# Patient Record
Sex: Male | Born: 1935 | Race: White | Hispanic: No | Smoking: Former smoker
Health system: Southern US, Community
[De-identification: ages and names within clinical notes are randomized; demographics above are authoritative.]

## PROBLEM LIST (undated history)

## (undated) DIAGNOSIS — I255 Ischemic cardiomyopathy: Secondary | ICD-10-CM

## (undated) DIAGNOSIS — I82409 Acute embolism and thrombosis of unspecified deep veins of unspecified lower extremity: Secondary | ICD-10-CM

## (undated) DIAGNOSIS — K7460 Unspecified cirrhosis of liver: Secondary | ICD-10-CM

## (undated) DIAGNOSIS — I495 Sick sinus syndrome: Secondary | ICD-10-CM

## (undated) DIAGNOSIS — E119 Type 2 diabetes mellitus without complications: Secondary | ICD-10-CM

## (undated) DIAGNOSIS — K2270 Barrett's esophagus without dysplasia: Secondary | ICD-10-CM

## (undated) DIAGNOSIS — K219 Gastro-esophageal reflux disease without esophagitis: Secondary | ICD-10-CM

## (undated) DIAGNOSIS — E039 Hypothyroidism, unspecified: Secondary | ICD-10-CM

## (undated) DIAGNOSIS — I519 Heart disease, unspecified: Secondary | ICD-10-CM

## (undated) DIAGNOSIS — E785 Hyperlipidemia, unspecified: Secondary | ICD-10-CM

## (undated) DIAGNOSIS — I10 Essential (primary) hypertension: Secondary | ICD-10-CM

## (undated) DIAGNOSIS — K5730 Diverticulosis of large intestine without perforation or abscess without bleeding: Secondary | ICD-10-CM

## (undated) DIAGNOSIS — C3491 Malignant neoplasm of unspecified part of right bronchus or lung: Secondary | ICD-10-CM

## (undated) DIAGNOSIS — I480 Paroxysmal atrial fibrillation: Secondary | ICD-10-CM

## (undated) DIAGNOSIS — I350 Nonrheumatic aortic (valve) stenosis: Secondary | ICD-10-CM

## (undated) DIAGNOSIS — I00 Rheumatic fever without heart involvement: Secondary | ICD-10-CM

## (undated) DIAGNOSIS — K648 Other hemorrhoids: Secondary | ICD-10-CM

## (undated) HISTORY — DX: Paroxysmal atrial fibrillation: I48.0

## (undated) HISTORY — DX: Hypothyroidism, unspecified: E03.9

## (undated) HISTORY — DX: Essential (primary) hypertension: I10

## (undated) HISTORY — PX: FRACTURE SURGERY: SHX138

## (undated) HISTORY — PX: JOINT REPLACEMENT: SHX530

## (undated) HISTORY — PX: CARDIAC PACEMAKER PLACEMENT: SHX583

## (undated) HISTORY — PX: LUMBAR DISC SURGERY: SHX700

## (undated) HISTORY — PX: ANKLE FRACTURE SURGERY: SHX122

## (undated) HISTORY — DX: Gastro-esophageal reflux disease without esophagitis: K21.9

## (undated) HISTORY — DX: Barrett's esophagus without dysplasia: K22.70

## (undated) HISTORY — PX: APPENDECTOMY: SHX54

## (undated) HISTORY — DX: Other hemorrhoids: K64.8

## (undated) HISTORY — DX: Hyperlipidemia, unspecified: E78.5

## (undated) HISTORY — DX: Heart disease, unspecified: I51.9

## (undated) HISTORY — PX: CARDIAC CATHETERIZATION: SHX172

## (undated) HISTORY — DX: Nonrheumatic aortic (valve) stenosis: I35.0

## (undated) HISTORY — DX: Diverticulosis of large intestine without perforation or abscess without bleeding: K57.30

## (undated) HISTORY — DX: Unspecified cirrhosis of liver: K74.60

## (undated) HISTORY — PX: COLONOSCOPY: SHX174

## (undated) HISTORY — DX: Sick sinus syndrome: I49.5

## (undated) HISTORY — DX: Rheumatic fever without heart involvement: I00

## (undated) HISTORY — DX: Ischemic cardiomyopathy: I25.5

## (undated) HISTORY — PX: TOTAL HIP ARTHROPLASTY: SHX124

---

## 1996-12-01 HISTORY — PX: LUNG REMOVAL, PARTIAL: SHX233

## 1998-08-20 ENCOUNTER — Ambulatory Visit (HOSPITAL_COMMUNITY): Admission: RE | Admit: 1998-08-20 | Discharge: 1998-08-20 | Payer: Self-pay | Admitting: Hematology and Oncology

## 1998-08-20 ENCOUNTER — Encounter: Payer: Self-pay | Admitting: Hematology and Oncology

## 1999-01-16 ENCOUNTER — Ambulatory Visit (HOSPITAL_COMMUNITY): Admission: RE | Admit: 1999-01-16 | Discharge: 1999-01-16 | Payer: Self-pay | Admitting: Hematology and Oncology

## 1999-01-23 ENCOUNTER — Encounter: Payer: Self-pay | Admitting: Hematology and Oncology

## 1999-01-23 ENCOUNTER — Ambulatory Visit (HOSPITAL_COMMUNITY): Admission: RE | Admit: 1999-01-23 | Discharge: 1999-01-23 | Payer: Self-pay | Admitting: Hematology and Oncology

## 1999-05-24 ENCOUNTER — Encounter: Payer: Self-pay | Admitting: Neurosurgery

## 1999-05-27 ENCOUNTER — Ambulatory Visit (HOSPITAL_COMMUNITY): Admission: RE | Admit: 1999-05-27 | Discharge: 1999-05-27 | Payer: Self-pay | Admitting: Neurosurgery

## 1999-05-27 ENCOUNTER — Encounter: Payer: Self-pay | Admitting: Neurosurgery

## 1999-08-08 ENCOUNTER — Encounter: Payer: Self-pay | Admitting: Hematology and Oncology

## 1999-08-08 ENCOUNTER — Ambulatory Visit (HOSPITAL_COMMUNITY): Admission: RE | Admit: 1999-08-08 | Discharge: 1999-08-08 | Payer: Self-pay | Admitting: Hematology and Oncology

## 1999-10-23 ENCOUNTER — Encounter: Admission: RE | Admit: 1999-10-23 | Discharge: 1999-10-23 | Payer: Self-pay | Admitting: Thoracic Surgery

## 1999-10-23 ENCOUNTER — Encounter: Payer: Self-pay | Admitting: Thoracic Surgery

## 1999-11-06 ENCOUNTER — Encounter: Admission: RE | Admit: 1999-11-06 | Discharge: 1999-11-06 | Payer: Self-pay | Admitting: *Deleted

## 1999-11-06 ENCOUNTER — Encounter: Payer: Self-pay | Admitting: *Deleted

## 2000-01-28 ENCOUNTER — Encounter: Payer: Self-pay | Admitting: Hematology and Oncology

## 2000-01-28 ENCOUNTER — Ambulatory Visit (HOSPITAL_COMMUNITY): Admission: RE | Admit: 2000-01-28 | Discharge: 2000-01-28 | Payer: Self-pay | Admitting: Hematology and Oncology

## 2000-08-06 ENCOUNTER — Ambulatory Visit (HOSPITAL_COMMUNITY): Admission: RE | Admit: 2000-08-06 | Discharge: 2000-08-06 | Payer: Self-pay | Admitting: Hematology and Oncology

## 2000-08-06 ENCOUNTER — Encounter: Payer: Self-pay | Admitting: Hematology and Oncology

## 2001-02-23 ENCOUNTER — Ambulatory Visit (HOSPITAL_COMMUNITY): Admission: RE | Admit: 2001-02-23 | Discharge: 2001-02-23 | Payer: Self-pay | Admitting: Hematology and Oncology

## 2001-02-23 ENCOUNTER — Encounter: Payer: Self-pay | Admitting: Hematology and Oncology

## 2001-03-15 ENCOUNTER — Encounter: Payer: Self-pay | Admitting: Hematology and Oncology

## 2001-03-15 ENCOUNTER — Encounter: Admission: RE | Admit: 2001-03-15 | Discharge: 2001-03-15 | Payer: Self-pay | Admitting: Hematology and Oncology

## 2001-09-22 ENCOUNTER — Ambulatory Visit (HOSPITAL_COMMUNITY): Admission: RE | Admit: 2001-09-22 | Discharge: 2001-09-22 | Payer: Self-pay | Admitting: Hematology and Oncology

## 2001-09-22 ENCOUNTER — Encounter: Payer: Self-pay | Admitting: Hematology and Oncology

## 2002-04-11 ENCOUNTER — Ambulatory Visit (HOSPITAL_COMMUNITY): Admission: RE | Admit: 2002-04-11 | Discharge: 2002-04-11 | Payer: Self-pay | Admitting: Hematology and Oncology

## 2002-04-11 ENCOUNTER — Encounter: Payer: Self-pay | Admitting: Hematology and Oncology

## 2002-08-12 ENCOUNTER — Encounter (INDEPENDENT_AMBULATORY_CARE_PROVIDER_SITE_OTHER): Payer: Self-pay | Admitting: Specialist

## 2002-08-12 ENCOUNTER — Ambulatory Visit (HOSPITAL_COMMUNITY): Admission: RE | Admit: 2002-08-12 | Discharge: 2002-08-12 | Payer: Self-pay | Admitting: *Deleted

## 2003-10-02 ENCOUNTER — Ambulatory Visit (HOSPITAL_COMMUNITY): Admission: RE | Admit: 2003-10-02 | Discharge: 2003-10-02 | Payer: Self-pay | Admitting: Oncology

## 2004-03-01 ENCOUNTER — Encounter: Admission: RE | Admit: 2004-03-01 | Discharge: 2004-03-01 | Payer: Self-pay | Admitting: Internal Medicine

## 2004-06-19 ENCOUNTER — Emergency Department (HOSPITAL_COMMUNITY): Admission: EM | Admit: 2004-06-19 | Discharge: 2004-06-19 | Payer: Self-pay | Admitting: Emergency Medicine

## 2004-10-03 ENCOUNTER — Ambulatory Visit: Payer: Self-pay | Admitting: Oncology

## 2004-10-04 ENCOUNTER — Ambulatory Visit (HOSPITAL_COMMUNITY): Admission: RE | Admit: 2004-10-04 | Discharge: 2004-10-04 | Payer: Self-pay | Admitting: Oncology

## 2005-08-22 ENCOUNTER — Encounter: Admission: RE | Admit: 2005-08-22 | Discharge: 2005-08-22 | Payer: Self-pay | Admitting: Internal Medicine

## 2005-09-05 ENCOUNTER — Encounter: Admission: RE | Admit: 2005-09-05 | Discharge: 2005-09-05 | Payer: Self-pay | Admitting: Neurosurgery

## 2005-09-26 ENCOUNTER — Ambulatory Visit: Payer: Self-pay | Admitting: Oncology

## 2005-10-01 ENCOUNTER — Ambulatory Visit (HOSPITAL_COMMUNITY): Admission: RE | Admit: 2005-10-01 | Discharge: 2005-10-01 | Payer: Self-pay | Admitting: Oncology

## 2005-12-02 ENCOUNTER — Ambulatory Visit (HOSPITAL_COMMUNITY): Admission: RE | Admit: 2005-12-02 | Discharge: 2005-12-02 | Payer: Self-pay | Admitting: Neurosurgery

## 2006-01-06 ENCOUNTER — Encounter: Admission: RE | Admit: 2006-01-06 | Discharge: 2006-01-06 | Payer: Self-pay | Admitting: Neurosurgery

## 2006-02-01 ENCOUNTER — Encounter: Admission: RE | Admit: 2006-02-01 | Discharge: 2006-02-01 | Payer: Self-pay | Admitting: Neurosurgery

## 2006-03-24 ENCOUNTER — Inpatient Hospital Stay (HOSPITAL_COMMUNITY): Admission: RE | Admit: 2006-03-24 | Discharge: 2006-03-27 | Payer: Self-pay | Admitting: Orthopedic Surgery

## 2006-03-28 ENCOUNTER — Emergency Department (HOSPITAL_COMMUNITY): Admission: EM | Admit: 2006-03-28 | Discharge: 2006-03-28 | Payer: Self-pay | Admitting: Internal Medicine

## 2006-04-08 ENCOUNTER — Encounter: Payer: Self-pay | Admitting: Vascular Surgery

## 2006-04-08 ENCOUNTER — Encounter (HOSPITAL_BASED_OUTPATIENT_CLINIC_OR_DEPARTMENT_OTHER): Admission: RE | Admit: 2006-04-08 | Discharge: 2006-04-21 | Payer: Self-pay | Admitting: Surgery

## 2006-04-08 ENCOUNTER — Ambulatory Visit: Admission: RE | Admit: 2006-04-08 | Discharge: 2006-04-08 | Payer: Self-pay | Admitting: Orthopedic Surgery

## 2006-06-02 ENCOUNTER — Ambulatory Visit: Payer: Self-pay | Admitting: Family Medicine

## 2006-07-27 ENCOUNTER — Ambulatory Visit: Payer: Self-pay | Admitting: Family Medicine

## 2006-09-21 ENCOUNTER — Ambulatory Visit: Payer: Self-pay | Admitting: Family Medicine

## 2006-09-21 LAB — CONVERTED CEMR LAB
AST: 40 units/L — ABNORMAL HIGH (ref 0–37)
Chol/HDL Ratio, serum: 4.3
Cholesterol: 164 mg/dL (ref 0–200)
HDL: 38 mg/dL — ABNORMAL LOW (ref >39.0)
Triglyceride fasting, serum: 56 mg/dL (ref 0–149)

## 2006-10-27 ENCOUNTER — Ambulatory Visit: Payer: Self-pay | Admitting: Family Medicine

## 2006-10-27 LAB — CONVERTED CEMR LAB: ALT: 65 units/L — ABNORMAL HIGH (ref 0–40)

## 2006-11-10 ENCOUNTER — Ambulatory Visit: Payer: Self-pay | Admitting: Family Medicine

## 2006-11-16 ENCOUNTER — Ambulatory Visit: Payer: Self-pay | Admitting: Gastroenterology

## 2006-11-17 ENCOUNTER — Ambulatory Visit: Payer: Self-pay | Admitting: Family Medicine

## 2006-11-25 ENCOUNTER — Ambulatory Visit: Payer: Self-pay | Admitting: Family Medicine

## 2006-11-25 LAB — CONVERTED CEMR LAB
ALT: 71 units/L — ABNORMAL HIGH (ref 0–40)
AST: 55 units/L — ABNORMAL HIGH (ref 0–37)
BUN: 13 mg/dL (ref 6–23)
Calcium: 9.5 mg/dL (ref 8.4–10.5)
Chloride: 107 meq/L (ref 96–112)
Chol/HDL Ratio, serum: 3.1
Cholesterol: 129 mg/dL (ref 0–200)
GFR calc non Af Amer: 58 mL/min
Glomerular Filtration Rate, Af Am: 70 mL/min/{1.73_m2}
LDL Cholesterol: 78 mg/dL (ref 0–99)
VLDL: 10 mg/dL (ref 0–40)

## 2006-11-30 ENCOUNTER — Encounter: Admission: RE | Admit: 2006-11-30 | Discharge: 2006-11-30 | Payer: Self-pay | Admitting: Family Medicine

## 2006-12-02 ENCOUNTER — Ambulatory Visit: Payer: Self-pay | Admitting: *Deleted

## 2006-12-14 ENCOUNTER — Ambulatory Visit: Payer: Self-pay | Admitting: Gastroenterology

## 2006-12-14 LAB — CONVERTED CEMR LAB
AFP-Tumor Marker: 2.3 ng/mL (ref 0.0–8.0)
ALT: 54 units/L — ABNORMAL HIGH (ref 0–40)
Alkaline Phosphatase: 92 units/L (ref 39–117)
CO2: 32 meq/L (ref 19–32)
Calcium: 9.9 mg/dL (ref 8.4–10.5)
Ferritin: 76.1 ng/mL (ref 22.0–322.0)
Glucose, Bld: 101 mg/dL — ABNORMAL HIGH (ref 70–99)
HCV Ab: NEGATIVE
Hemoglobin: 16.5 g/dL (ref 13.0–17.0)
Hep B S Ab: NEGATIVE
Hepatitis B Surface Ag: NEGATIVE
INR: 1.2 (ref 0.9–2.0)
Iron: 59 ug/dL (ref 42–165)
Platelets: 187 10*3/uL (ref 150–400)
Prothrombin Time: 13.6 s (ref 10.0–14.0)
RBC: 5.89 M/uL — ABNORMAL HIGH (ref 4.22–5.81)
RDW: 13.6 % (ref 11.5–14.6)
Sodium: 143 meq/L (ref 135–145)
Total Protein: 7.2 g/dL (ref 6.0–8.3)

## 2006-12-22 DIAGNOSIS — K219 Gastro-esophageal reflux disease without esophagitis: Secondary | ICD-10-CM

## 2006-12-22 DIAGNOSIS — E039 Hypothyroidism, unspecified: Secondary | ICD-10-CM

## 2007-01-01 DIAGNOSIS — K2270 Barrett's esophagus without dysplasia: Secondary | ICD-10-CM

## 2007-01-06 ENCOUNTER — Ambulatory Visit: Payer: Self-pay | Admitting: Gastroenterology

## 2007-01-15 ENCOUNTER — Ambulatory Visit: Payer: Self-pay | Admitting: Family Medicine

## 2007-01-20 ENCOUNTER — Encounter (INDEPENDENT_AMBULATORY_CARE_PROVIDER_SITE_OTHER): Payer: Self-pay | Admitting: *Deleted

## 2007-01-20 ENCOUNTER — Ambulatory Visit: Payer: Self-pay | Admitting: Gastroenterology

## 2007-02-24 ENCOUNTER — Ambulatory Visit: Payer: Self-pay | Admitting: Family Medicine

## 2007-03-05 ENCOUNTER — Ambulatory Visit: Payer: Self-pay | Admitting: Gastroenterology

## 2007-04-16 ENCOUNTER — Ambulatory Visit: Payer: Self-pay | Admitting: Gastroenterology

## 2007-05-26 ENCOUNTER — Encounter (INDEPENDENT_AMBULATORY_CARE_PROVIDER_SITE_OTHER): Payer: Self-pay | Admitting: Family Medicine

## 2007-05-31 ENCOUNTER — Ambulatory Visit: Payer: Self-pay | Admitting: Family Medicine

## 2007-05-31 DIAGNOSIS — E785 Hyperlipidemia, unspecified: Secondary | ICD-10-CM | POA: Insufficient documentation

## 2007-05-31 LAB — CONVERTED CEMR LAB
CO2: 29 meq/L (ref 19–32)
Creatinine, Ser: 1.2 mg/dL (ref 0.4–1.5)
GFR calc Af Amer: 77 mL/min
Potassium: 4.8 meq/L (ref 3.5–5.1)
Sodium: 142 meq/L (ref 135–145)

## 2007-06-01 ENCOUNTER — Telehealth (INDEPENDENT_AMBULATORY_CARE_PROVIDER_SITE_OTHER): Payer: Self-pay | Admitting: *Deleted

## 2007-06-28 ENCOUNTER — Ambulatory Visit: Payer: Self-pay | Admitting: Gastroenterology

## 2007-07-09 ENCOUNTER — Ambulatory Visit: Payer: Self-pay | Admitting: Gastroenterology

## 2007-10-04 ENCOUNTER — Ambulatory Visit: Payer: Self-pay | Admitting: Family Medicine

## 2007-10-04 DIAGNOSIS — L219 Seborrheic dermatitis, unspecified: Secondary | ICD-10-CM | POA: Insufficient documentation

## 2007-10-06 ENCOUNTER — Telehealth (INDEPENDENT_AMBULATORY_CARE_PROVIDER_SITE_OTHER): Payer: Self-pay | Admitting: Family Medicine

## 2007-10-06 DIAGNOSIS — Z85118 Personal history of other malignant neoplasm of bronchus and lung: Secondary | ICD-10-CM | POA: Insufficient documentation

## 2007-10-08 ENCOUNTER — Ambulatory Visit: Payer: Self-pay | Admitting: Gastroenterology

## 2007-10-08 ENCOUNTER — Encounter (INDEPENDENT_AMBULATORY_CARE_PROVIDER_SITE_OTHER): Payer: Self-pay | Admitting: Family Medicine

## 2007-10-14 ENCOUNTER — Encounter: Admission: RE | Admit: 2007-10-14 | Discharge: 2007-10-14 | Payer: Self-pay | Admitting: Family Medicine

## 2007-10-19 ENCOUNTER — Telehealth (INDEPENDENT_AMBULATORY_CARE_PROVIDER_SITE_OTHER): Payer: Self-pay | Admitting: *Deleted

## 2007-10-22 ENCOUNTER — Encounter (INDEPENDENT_AMBULATORY_CARE_PROVIDER_SITE_OTHER): Payer: Self-pay | Admitting: Family Medicine

## 2007-11-08 ENCOUNTER — Ambulatory Visit: Payer: Self-pay | Admitting: Family Medicine

## 2007-11-16 DIAGNOSIS — K7460 Unspecified cirrhosis of liver: Secondary | ICD-10-CM

## 2007-12-03 ENCOUNTER — Ambulatory Visit: Payer: Self-pay | Admitting: Gastroenterology

## 2007-12-03 LAB — CONVERTED CEMR LAB
AFP-Tumor Marker: 2.9 ng/mL (ref 0.0–8.0)
ALT: 51 units/L (ref 0–53)
Basophils Relative: 0.7 % (ref 0.0–1.0)
Bilirubin, Direct: 0.2 mg/dL (ref 0.0–0.3)
CO2: 32 meq/L (ref 19–32)
Eosinophils Relative: 2.7 % (ref 0.0–5.0)
GFR calc Af Amer: 70 mL/min
Glucose, Bld: 105 mg/dL — ABNORMAL HIGH (ref 70–99)
Hemoglobin: 16.4 g/dL (ref 13.0–17.0)
Lymphocytes Relative: 27.7 % (ref 12.0–46.0)
MCV: 83.2 fL (ref 78.0–100.0)
Monocytes Absolute: 0.6 10*3/uL (ref 0.2–0.7)
Neutro Abs: 4.5 10*3/uL (ref 1.4–7.7)
Neutrophils Relative %: 60.5 % (ref 43.0–77.0)
Potassium: 5.2 meq/L — ABNORMAL HIGH (ref 3.5–5.1)
Prothrombin Time: 12.3 s (ref 10.9–13.3)
Sodium: 140 meq/L (ref 135–145)
Total Protein: 7.5 g/dL (ref 6.0–8.3)
WBC: 7.4 10*3/uL (ref 4.5–10.5)

## 2007-12-07 ENCOUNTER — Ambulatory Visit: Payer: Self-pay | Admitting: Cardiology

## 2007-12-20 ENCOUNTER — Encounter (INDEPENDENT_AMBULATORY_CARE_PROVIDER_SITE_OTHER): Payer: Self-pay | Admitting: Family Medicine

## 2007-12-27 ENCOUNTER — Ambulatory Visit: Payer: Self-pay | Admitting: Gastroenterology

## 2008-01-11 ENCOUNTER — Ambulatory Visit: Payer: Self-pay | Admitting: Gastroenterology

## 2008-01-11 ENCOUNTER — Encounter: Payer: Self-pay | Admitting: Gastroenterology

## 2008-01-11 ENCOUNTER — Encounter (INDEPENDENT_AMBULATORY_CARE_PROVIDER_SITE_OTHER): Payer: Self-pay | Admitting: Family Medicine

## 2008-01-11 DIAGNOSIS — K5730 Diverticulosis of large intestine without perforation or abscess without bleeding: Secondary | ICD-10-CM | POA: Insufficient documentation

## 2008-01-11 DIAGNOSIS — K648 Other hemorrhoids: Secondary | ICD-10-CM | POA: Insufficient documentation

## 2008-01-24 DIAGNOSIS — D126 Benign neoplasm of colon, unspecified: Secondary | ICD-10-CM

## 2008-02-07 ENCOUNTER — Ambulatory Visit: Payer: Self-pay | Admitting: Family Medicine

## 2008-02-09 ENCOUNTER — Ambulatory Visit: Payer: Self-pay | Admitting: Family Medicine

## 2008-02-10 ENCOUNTER — Encounter (INDEPENDENT_AMBULATORY_CARE_PROVIDER_SITE_OTHER): Payer: Self-pay | Admitting: *Deleted

## 2008-02-10 LAB — CONVERTED CEMR LAB
Cholesterol: 142 mg/dL (ref 0–200)
HDL: 37.6 mg/dL — ABNORMAL LOW (ref >39.0)
VLDL: 14 mg/dL (ref 0–40)

## 2008-03-01 LAB — HM COLONOSCOPY

## 2008-04-27 ENCOUNTER — Ambulatory Visit: Payer: Self-pay | Admitting: Family Medicine

## 2008-04-27 LAB — CONVERTED CEMR LAB

## 2008-05-05 ENCOUNTER — Telehealth (INDEPENDENT_AMBULATORY_CARE_PROVIDER_SITE_OTHER): Payer: Self-pay | Admitting: *Deleted

## 2008-05-29 ENCOUNTER — Ambulatory Visit: Payer: Self-pay | Admitting: Family Medicine

## 2008-05-29 LAB — CONVERTED CEMR LAB: Glucose, Bld: 166 mg/dL

## 2008-07-16 ENCOUNTER — Emergency Department (HOSPITAL_COMMUNITY): Admission: EM | Admit: 2008-07-16 | Discharge: 2008-07-16 | Payer: Self-pay | Admitting: *Deleted

## 2008-07-17 ENCOUNTER — Encounter: Payer: Self-pay | Admitting: Family Medicine

## 2008-07-20 DIAGNOSIS — N401 Enlarged prostate with lower urinary tract symptoms: Secondary | ICD-10-CM

## 2008-07-20 DIAGNOSIS — N138 Other obstructive and reflux uropathy: Secondary | ICD-10-CM | POA: Insufficient documentation

## 2008-07-24 ENCOUNTER — Encounter: Payer: Self-pay | Admitting: Family Medicine

## 2008-07-31 ENCOUNTER — Encounter: Admission: RE | Admit: 2008-07-31 | Discharge: 2008-07-31 | Payer: Self-pay | Admitting: Urology

## 2008-08-16 LAB — HM DIABETES FOOT EXAM

## 2008-08-29 ENCOUNTER — Ambulatory Visit: Payer: Self-pay | Admitting: Family Medicine

## 2008-08-30 LAB — CONVERTED CEMR LAB
ALT: 30 units/L (ref 0–53)
CO2: 22 meq/L (ref 19–32)
Calcium: 9.7 mg/dL (ref 8.4–10.5)
Chloride: 104 meq/L (ref 96–112)
Glucose, Bld: 103 mg/dL — ABNORMAL HIGH (ref 70–99)
Sodium: 141 meq/L (ref 135–145)
TSH: 1.862 microintl units/mL (ref 0.350–4.50)
Total Protein: 7.3 g/dL (ref 6.0–8.3)

## 2008-09-06 ENCOUNTER — Encounter: Payer: Self-pay | Admitting: Family Medicine

## 2008-09-30 ENCOUNTER — Encounter: Admission: RE | Admit: 2008-09-30 | Discharge: 2008-09-30 | Payer: Self-pay | Admitting: Neurosurgery

## 2008-11-28 ENCOUNTER — Ambulatory Visit: Payer: Self-pay | Admitting: Family Medicine

## 2008-11-28 LAB — CONVERTED CEMR LAB: Hgb A1c MFr Bld: 5.8 %

## 2008-12-04 ENCOUNTER — Telehealth: Payer: Self-pay | Admitting: Family Medicine

## 2008-12-05 ENCOUNTER — Encounter: Admission: RE | Admit: 2008-12-05 | Discharge: 2008-12-05 | Payer: Self-pay | Admitting: Family Medicine

## 2008-12-21 ENCOUNTER — Telehealth: Payer: Self-pay | Admitting: Family Medicine

## 2008-12-27 ENCOUNTER — Telehealth: Payer: Self-pay | Admitting: Family Medicine

## 2009-01-01 ENCOUNTER — Encounter: Payer: Self-pay | Admitting: Family Medicine

## 2009-01-02 ENCOUNTER — Encounter: Payer: Self-pay | Admitting: Family Medicine

## 2009-01-02 LAB — CONVERTED CEMR LAB
AST: 22 units/L (ref 0–37)
Alkaline Phosphatase: 81 units/L (ref 39–117)
Bilirubin, Direct: 0.1 mg/dL (ref 0.0–0.3)
HDL goal, serum: 40 mg/dL
Indirect Bilirubin: 0.4 mg/dL (ref 0.0–0.9)
LDL Cholesterol: 92 mg/dL (ref 0–99)
Total Bilirubin: 0.5 mg/dL (ref 0.3–1.2)

## 2009-01-11 ENCOUNTER — Telehealth (INDEPENDENT_AMBULATORY_CARE_PROVIDER_SITE_OTHER): Payer: Self-pay | Admitting: *Deleted

## 2009-01-15 ENCOUNTER — Encounter: Payer: Self-pay | Admitting: Family Medicine

## 2009-02-02 ENCOUNTER — Encounter: Payer: Self-pay | Admitting: Family Medicine

## 2009-02-05 ENCOUNTER — Ambulatory Visit: Payer: Self-pay | Admitting: Family Medicine

## 2009-02-05 LAB — CONVERTED CEMR LAB: TSH: 2.622 microintl units/mL (ref 0.350–4.500)

## 2009-03-07 ENCOUNTER — Encounter: Payer: Self-pay | Admitting: Family Medicine

## 2009-03-14 ENCOUNTER — Ambulatory Visit: Payer: Self-pay | Admitting: Family Medicine

## 2009-03-26 ENCOUNTER — Ambulatory Visit: Payer: Self-pay | Admitting: Family Medicine

## 2009-03-26 LAB — CONVERTED CEMR LAB
Blood Glucose, Fasting: 122 mg/dL
Hgb A1c MFr Bld: 6 %

## 2009-06-14 ENCOUNTER — Telehealth: Payer: Self-pay | Admitting: Family Medicine

## 2009-08-08 ENCOUNTER — Encounter: Payer: Self-pay | Admitting: Family Medicine

## 2009-08-15 ENCOUNTER — Telehealth: Payer: Self-pay | Admitting: Family Medicine

## 2009-08-16 ENCOUNTER — Ambulatory Visit: Payer: Self-pay | Admitting: Family Medicine

## 2009-08-16 DIAGNOSIS — E8881 Metabolic syndrome: Secondary | ICD-10-CM | POA: Insufficient documentation

## 2009-08-17 LAB — CONVERTED CEMR LAB: TSH: 1.569 microintl units/mL (ref 0.350–4.500)

## 2009-09-25 ENCOUNTER — Ambulatory Visit: Payer: Self-pay | Admitting: Family Medicine

## 2009-09-25 LAB — CONVERTED CEMR LAB: Hgb A1c MFr Bld: 5.8 %

## 2009-09-26 LAB — CONVERTED CEMR LAB
ALT: 31 units/L (ref 0–53)
BUN: 19 mg/dL (ref 6–23)
CO2: 24 meq/L (ref 19–32)
Calcium: 9.8 mg/dL (ref 8.4–10.5)
Chloride: 104 meq/L (ref 96–112)
Cholesterol: 166 mg/dL (ref 0–200)
Creatinine, Ser: 1.19 mg/dL (ref 0.40–1.50)
Glucose, Bld: 114 mg/dL — ABNORMAL HIGH (ref 70–99)
Total CHOL/HDL Ratio: 3.6
Triglycerides: 76 mg/dL (ref <150)

## 2009-10-19 ENCOUNTER — Telehealth: Payer: Self-pay | Admitting: Family Medicine

## 2009-11-01 ENCOUNTER — Encounter: Admission: RE | Admit: 2009-11-01 | Discharge: 2009-11-01 | Payer: Self-pay | Admitting: Family Medicine

## 2009-12-03 ENCOUNTER — Emergency Department (HOSPITAL_COMMUNITY)
Admission: EM | Admit: 2009-12-03 | Discharge: 2009-12-03 | Payer: Self-pay | Source: Home / Self Care | Admitting: Emergency Medicine

## 2009-12-06 ENCOUNTER — Ambulatory Visit: Payer: Self-pay | Admitting: Family Medicine

## 2010-01-28 ENCOUNTER — Ambulatory Visit: Payer: Self-pay | Admitting: Family Medicine

## 2010-01-28 LAB — CONVERTED CEMR LAB
ALT: 21 units/L (ref 0–53)
CO2: 22 meq/L (ref 19–32)
Calcium: 9.7 mg/dL (ref 8.4–10.5)
Chloride: 103 meq/L (ref 96–112)
Cholesterol: 126 mg/dL (ref 0–200)
Creatinine, Ser: 1.2 mg/dL (ref 0.40–1.50)
Glucose, Bld: 97 mg/dL (ref 70–99)
Hgb A1c MFr Bld: 5.8 %
Total Protein: 7.3 g/dL (ref 6.0–8.3)

## 2010-05-25 ENCOUNTER — Encounter: Payer: Self-pay | Admitting: Family Medicine

## 2010-06-12 ENCOUNTER — Ambulatory Visit: Payer: Self-pay | Admitting: Family Medicine

## 2010-07-01 HISTORY — PX: CATARACT EXTRACTION, BILATERAL: SHX1313

## 2010-07-15 ENCOUNTER — Ambulatory Visit: Payer: Self-pay | Admitting: Family Medicine

## 2010-07-15 LAB — CONVERTED CEMR LAB
CO2: 24 meq/L (ref 19–32)
Chloride: 104 meq/L (ref 96–112)
Hgb A1c MFr Bld: 5.9 %
Sodium: 139 meq/L (ref 135–145)

## 2010-11-19 ENCOUNTER — Ambulatory Visit: Payer: Self-pay | Admitting: Family Medicine

## 2010-11-19 ENCOUNTER — Encounter
Admission: RE | Admit: 2010-11-19 | Discharge: 2010-11-19 | Payer: Self-pay | Source: Home / Self Care | Attending: Family Medicine | Admitting: Family Medicine

## 2010-11-19 LAB — CONVERTED CEMR LAB
Creatinine,U: 100 mg/dL
Microalbumin U total vol: 10 mg/L

## 2010-11-20 ENCOUNTER — Encounter: Payer: Self-pay | Admitting: Family Medicine

## 2010-11-20 LAB — CONVERTED CEMR LAB
AST: 36 units/L (ref 0–37)
Albumin: 4.1 g/dL (ref 3.5–5.2)
Alkaline Phosphatase: 90 units/L (ref 39–117)
HDL: 49 mg/dL (ref >39)
LDL Cholesterol: 87 mg/dL (ref 0–99)
Potassium: 5.3 meq/L (ref 3.5–5.3)
Sodium: 140 meq/L (ref 135–145)
TSH: 2.308 microintl units/mL (ref 0.350–4.500)
Total Protein: 7 g/dL (ref 6.0–8.3)

## 2010-12-21 ENCOUNTER — Encounter: Payer: Self-pay | Admitting: Oncology

## 2010-12-21 ENCOUNTER — Encounter: Payer: Self-pay | Admitting: *Deleted

## 2010-12-23 ENCOUNTER — Encounter: Payer: Self-pay | Admitting: Urology

## 2010-12-31 NOTE — Assessment & Plan Note (Signed)
Summary: 4 MO FU DM, Chol   Vital Signs:  Patient profile:   76 year old male Height:      69 inches Weight:      229 pounds Pulse rate:   74 / minute BP sitting:   134 / 77  (left arm) Cuff size:   large  Vitals Entered By: Kathlene November (January 28, 2010 8:18 AM)  CC: checkup BP and BS   Primary Care Provider:  Linford Arnold, C  CC:  checkup BP and BS.  History of Present Illness: Doing well overal. Has been working out and exercising at [employer]. Brought in log os sugars and blood pressures.   Diabetes Management History:      The patient is a 76 years old male who comes in for evaluation of Type 2 Diabetes Mellitus.  He states understanding of dietary principles and is following his diet appropriately.  No sensory loss is reported.  He is checking home blood sugars.  He says that he is exercising.  Type of exercise includes: treadmill, bike.  He is doing this 7 times per week.        Hypoglycemic symptoms are not occurring.  No hyperglycemic symptoms are reported.  Other comments include: Has lost 10 lbs since last OV> Going to [facility] and quit eating  before bedtime.  .        There are no symptoms to suggest diabetic complications.  Since his last visit, no infections have occurred.        His home blood sugars include fasting blood sugars: highest: 145, lowest: 97.    Current Medications (verified): 1)  Albertsons Buffered Aspirin 325 Mg  Tabs (Aspirin Buffered) 2)  Levothyroxine Sodium 112 Mcg  Tabs (Levothyroxine Sodium) .... Take One Tablet By Mouth Once A Day 3)  Nexium 40 Mg  Cpdr (Esomeprazole Magnesium) .... Take One Tablert By Mouth Once A Day 4)  Terbinafine Hcl 250 Mg Tabs (Terbinafine Hcl) .... Take 1 Tab By Mouth Once Daily 5)  Pravastatin Sodium 20 Mg Tabs (Pravastatin Sodium) .... Take 1 Tablet By Mouth Once A Day At Bedtime  Allergies (verified): 1)  ! Lipitor  Comments:  Nurse/Medical Assistant: The patient's medications and allergies were reviewed with the  patient and were updated in the Medication and Allergy Lists. Kathlene November (January 28, 2010 8:19 AM)  Physical Exam  General:  Well-developed,well-nourished,in no acute distress; alert,appropriate and cooperative throughout examination Head:  Normocephalic and atraumatic without obvious abnormalities. No apparent alopecia or balding. Eyes:  No corneal or conjunctival inflammation noted. EOMI. Perrla.  Lungs:  Normal respiratory effort, chest expands symmetrically. Lungs are clear to auscultation, no crackles or wheezes. Heart:  Normal rate and regular rhythm. S1 and S2 normal without gallop, click, rub or other extra sounds. 1/6 SEM best heard aat the right sternal border.  Skin:  no rashes.   Cervical Nodes:  No lymphadenopathy noted Psych:  Cognition and judgment appear intact. Alert and cooperative with normal attention span and concentration. No apparent delusions, illusions, hallucinations   Impression & Recommendations:  Problem # 1:  DIABETES MELLITUS, CONTROLLED (ICD-250.00) Has lost 10 pounds and donig very well. Congratualted him.  Keep up the good work. CBGs look great. Not on any medications. F/U in 4 months.  Due for labs today His updated medication list for this problem includes:    Albertsons Buffered Aspirin 325 Mg Tabs (Aspirin buffered)  Orders: Fingerstick (36416) Hgb A1C ([id number]) T-Comprehensive Metabolic Panel ([id number])  Labs  Reviewed: Creat: 1.19 (09/25/2009)   Microalbumin: 10 (09/25/2009)  Last Eye Exam: normal (05/01/2009) Reviewed HgBA1c results: 5.8 (09/25/2009)  6.0 (03/26/2009)  Problem # 2:  HYPERLIPIDEMIA (ICD-272.4) Due to recheck level since we changed his statin. He has been tolerating this well.  His updated medication list for this problem includes:    Pravastatin Sodium 20 Mg Tabs (Pravastatin sodium) .Marland Kitchen... Take 1 tablet by mouth once a day at bedtime  Orders: T-Lipid Profile ([id number])  Problem # 3:  HYPOTHYROIDISM  (ICD-244.9) Due to recheck.  His updated medication list for this problem includes:    Levothyroxine Sodium 112 Mcg Tabs (Levothyroxine sodium) .Marland Kitchen... Take one tablet by mouth once a day  Orders: T-TSH ([id number])  Complete Medication List: 1)  Albertsons Buffered Aspirin 325 Mg Tabs (Aspirin buffered) 2)  Levothyroxine Sodium 112 Mcg Tabs (Levothyroxine sodium) .... Take one tablet by mouth once a day 3)  Nexium 40 Mg Cpdr (Esomeprazole magnesium) .... Take one tablert by mouth once a day 4)  Terbinafine Hcl 250 Mg Tabs (Terbinafine hcl) .... Take 1 tab by mouth once daily 5)  Pravastatin Sodium 20 Mg Tabs (Pravastatin sodium) .... Take 1 tablet by mouth once a day at bedtime  Diabetes Management Assessment/Plan:      The following lipid goals have been established for the patient: Total cholesterol goal of 200; LDL cholesterol goal of 100; HDL cholesterol goal of 40; Triglyceride goal of 150.  His blood pressure goal is < 130/80.    Patient Instructions: 1)  Please schedule a follow-up appointment in 4 months .   Laboratory Results   Blood Tests   Date/Time Received: 01/28/2010 Date/Time Reported: 01/28/2010  HGBA1C: 5.8%   (Normal Range: Non-Diabetic - 3-6%   Control Diabetic - 6-8%)

## 2010-12-31 NOTE — Assessment & Plan Note (Signed)
Summary: F/U DM, HTN, thyroid   Vital Signs:  Patient profile:   76 year old male Height:      69 inches Weight:      223 pounds BMI:     33.05 Pulse rate:   73 / minute BP sitting:   137 / 82  (left arm) Cuff size:   regular  Vitals Entered By: Avon Gully CMA, Duncan Dull) (July 15, 2010 8:12 AM) CC: f/u Diabetes   Primary Care Provider:  Linford Arnold, C  CC:  f/u Diabetes.  History of Present Illness: Recently had cataract surgery.  On prednisone drop adn his sugars have ben running just above 100.;   Has lost 6 lbs. Still going to the gym daily. This is very social for him so is good for him mentally.   Diabetes Management History:      The patient is a 76 years old male who comes in for evaluation of Type 2 Diabetes Mellitus.  He states understanding of dietary principles but he is not following the appropriate diet.  No sensory loss is reported.  Self foot exams are being performed.  He is checking home blood sugars.  He says that he is exercising.  Type of exercise includes: treadmill, bike.  He is doing this 7 times per week.        Hypoglycemic symptoms are not occurring.  No hyperglycemic symptoms are reported.        There are no symptoms to suggest diabetic complications.  Since his last visit, no infections have occurred.  No changes have been made to his treatment plan since last visit.        His home blood sugars include fasting blood sugars: highest: 122, lowest: 101.    Current Medications (verified): 1)  Albertsons Buffered Aspirin 325 Mg  Tabs (Aspirin Buffered) 2)  Levothyroxine Sodium 112 Mcg  Tabs (Levothyroxine Sodium) .... Take One Tablet By Mouth Once A Day 3)  Nexium 40 Mg  Cpdr (Esomeprazole Magnesium) .... Take One Tablert By Mouth Once A Day 4)  Pravastatin Sodium 20 Mg Tabs (Pravastatin Sodium) .... Take 1 Tablet By Mouth Once A Day At Bedtime  Allergies (verified): 1)  ! Lipitor  Comments:  Nurse/Medical Assistant: The patient's medications and  allergies were reviewed with the patient and were updated in the Medication and Allergy Lists. Avon Gully CMA, Duncan Dull) (July 15, 2010 8:12 AM)  Past History:  Past Surgical History: RIGHT MIDDLE AND  LOWER LOBECTOMY  DR. Edwyna Shell 1998 for lung cancer by Dr. Gershon Crane.   NECK SURGERY DR. POOL 05/1999 appendectomy, back cervical, rt. hip replacment - Dr. Charlann Boxer foot surgery, cardiac cath.  Bilat Cat surgery 07/2010 - Dr. Pecola Leisure.   Physical Exam  General:  Well-developed,well-nourished,in no acute distress; alert,appropriate and cooperative throughout examination Head:  Normocephalic and atraumatic without obvious abnormalities. No apparent alopecia or balding. Eyes:  No corneal or conjunctival inflammation noted. EOMI. Perrla. Neck:  No deformities, masses, or tenderness noted. Lungs:  Normal respiratory effort, chest expands symmetrically. Lungs are clear to auscultation, no crackles or wheezes. Heart:  Normal rate and regular rhythm. S1 and S2 normal without gallop, murmur, click, rub or other extra sounds. No carotid bruits.  Skin:  no rashes.   Cervical Nodes:  No lymphadenopathy noted Psych:  Cognition and judgment appear intact. Alert and cooperative with normal attention span and concentration. No apparent delusions, illusions, hallucinations   Impression & Recommendations:  Problem # 1:  DIABETES MELLITUS, CONTROLLED (ICD-250.00) WEll controlled.  Eye exam is up to date. A1C is great.  His updated medication list for this problem includes:    Albertsons Buffered Aspirin 325 Mg Tabs (Aspirin buffered)  Orders: Fingerstick (36416) Hgb A1C (16109UE) T-Basic Metabolic Panel ([id number])  Problem # 2:  HYPERTENSION (ICD-401.9) Looks great.   Complete Medication List: 1)  Albertsons Buffered Aspirin 325 Mg Tabs (Aspirin buffered) 2)  Levothyroxine Sodium 112 Mcg Tabs (Levothyroxine sodium) .... Take one tablet by mouth once a day 3)  Nexium 40 Mg Cpdr (Esomeprazole  magnesium) .... Take one tablert by mouth once a day 4)  Pravastatin Sodium 20 Mg Tabs (Pravastatin sodium) .... Take 1 tablet by mouth once a day at bedtime  Other Orders: T-TSH ([id number])  Diabetes Management Assessment/Plan:      The following lipid goals have been established for the patient: Total cholesterol goal of 200; LDL cholesterol goal of 100; HDL cholesterol goal of 40; Triglyceride goal of 150.  His blood pressure goal is < 130/80.    Patient Instructions: 1)  Please schedule a follow-up appointment in 4 months for diabetes and blood pressure .   Laboratory Results   Blood Tests   Date/Time Received: 07/15/10 Date/Time Reported: 07/15/10  HGBA1C: 5.9%   (Normal Range: Non-Diabetic - 3-6%   Control Diabetic - 6-8%)

## 2010-12-31 NOTE — Assessment & Plan Note (Signed)
Summary: Gastroenteritis, nose lesion   Vital Signs:  Patient profile:   76 year old male Height:      69 inches Weight:      239 pounds Temp:     98.4 degrees F oral Pulse rate:   99 / minute BP sitting:   126 / 77  (left arm) Cuff size:   large  Vitals Entered By: Kathlene November (December 06, 2009 2:57 PM) CC: Sunday had vomiting and diarrhea- went to Ed- given fluids- no diarrhea today or vomiting just feels tired   Primary Care Provider:  Linford Arnold, C  CC:  Sunday had vomiting and diarrhea- went to Ed- given fluids- no diarrhea today or vomiting just feels tired.  History of Present Illness: Sunday had vomiting and diarrhea- went to Ed- given fluids- no diarrhea today or vomiting just feels tired.  Feels 80% better. Wife had similar sxs. No fever.  Not having to use his nausea medicine. Plans on working on his weight.. Has a lesion on the inside of the left nostril, not bleeding.    Current Medications (verified): 1)  Albertsons Buffered Aspirin 325 Mg  Tabs (Aspirin Buffered) 2)  Levothyroxine Sodium 112 Mcg  Tabs (Levothyroxine Sodium) .... Take One Tablet By Mouth Once A Day 3)  Nexium 40 Mg  Cpdr (Esomeprazole Magnesium) .... Take One Tablert By Mouth Once A Day 4)  Terbinafine Hcl 250 Mg Tabs (Terbinafine Hcl) .... Take 1 Tab By Mouth Once Daily 5)  Pravastatin Sodium 20 Mg Tabs (Pravastatin Sodium) .... Take 1 Tablet By Mouth Once A Day At Bedtime  Allergies (verified): 1)  ! Lipitor  Comments:  Nurse/Medical Assistant: The patient's medications and allergies were reviewed with the patient and were updated in the Medication and Allergy Lists. Kathlene November (December 06, 2009 2:58 PM)  Physical Exam  General:  Well-developed,well-nourished,in no acute distress; alert,appropriate and cooperative throughout examination Head:  Normocephalic and atraumatic without obvious abnormalities. No apparent alopecia or balding. Nose:  Left outer nares with with 2 fine pustules ans  some surrounding erythema.  Mouth:  Lips appear dry.  Lungs:  Normal respiratory effort, chest expands symmetrically. Lungs are clear to auscultation, no crackles or wheezes. Heart:  Normal rate and regular rhythm. S1 and S2 normal without gallop, murmur, click, rub or other extra sounds. Abdomen:  Bowel sounds positive,abdomen soft and non-tender without masses, organomegaly or hernias noted. Skin:  no rashes.   Psych:  Cognition and judgment appear intact. Alert and cooperative with normal attention span and concentration. No apparent delusions, illusions, hallucinations   Impression & Recommendations:  Problem # 1:  GASTROENTERITIS (ICD-558.9) Overall much better. I still think he looks a little dry on exam and encouraged him to drink more fluids.  Also expect is may still take a few days for his energy to come back.   Problem # 2:  WOUND, OPEN, NOSE (ICD-873.20) On exam see 2 small pustules so will treat with topical Bactroban ointment.  If not better after one week then please let know adn will consider culture.    Complete Medication List: 1)  Albertsons Buffered Aspirin 325 Mg Tabs (Aspirin buffered) 2)  Levothyroxine Sodium 112 Mcg Tabs (Levothyroxine sodium) .... Take one tablet by mouth once a day 3)  Nexium 40 Mg Cpdr (Esomeprazole magnesium) .... Take one tablert by mouth once a day 4)  Terbinafine Hcl 250 Mg Tabs (Terbinafine hcl) .... Take 1 tab by mouth once daily 5)  Pravastatin Sodium 20  Mg Tabs (Pravastatin sodium) .... Take 1 tablet by mouth once a day at bedtime 6)  Bactroban 2 % Oint (Mupirocin) .... Apply two times a day for 10 days Prescriptions: BACTROBAN 2 % OINT (MUPIROCIN) Apply two times a day for 10 days  #1 tube x 0   Entered and Authorized by:   Nani Gasser MD   Signed by:   Nani Gasser MD on 12/06/2009   Method used:   Electronically to        [facility] (retail)       [address]       [place], [place]  [address]       Ph: [phone]        Fax: [phone]   RxID:   [id number]

## 2011-01-02 NOTE — Assessment & Plan Note (Signed)
Summary: Pt. wants a CXR and labs   Vital Signs:  Patient profile:   76 year old male Height:      69 inches Weight:      208 pounds Pulse rate:   90 / minute BP sitting:   138 / 81  (right arm) Cuff size:   regular  Vitals Entered By: Avon Gully CMA, Duncan Dull) (November 19, 2010 10:57 AM) CC: f/u meds, needs f/u chest xray hx of lung CA, Hypertension Management   Primary Care Danel Studzinski:  Linford Arnold, C  CC:  f/u meds, needs f/u chest xray hx of lung CA, and Hypertension Management.  History of Present Illness: Doing well as far as losing weight.  He is still exercising and his goal is to get to 200 lbs.    Due for yearly CXR with his hx of lung CA.   Home BPs looks great. Brought in a log. He is off BP medications.   Diabetes Management History:      The patient is a 76 years old male who comes in for evaluation of Type 2 Diabetes Mellitus.  He states understanding of dietary principles and is following his diet appropriately.  He is checking home blood sugars.  He says that he is exercising.  Type of exercise includes: treadmill, bike.  He is doing this 7 times per week.        Hypoglycemic symptoms are not occurring.  No hyperglycemic symptoms are reported.        Since his last visit, no infections have occurred.  He's had no treatment plan problems.        His home blood sugars include fasting blood sugars: highest: 147, lowest: 92.    Hypertension History:      He denies headache, chest pain, palpitations, dyspnea with exertion, orthopnea, PND, peripheral edema, visual symptoms, neurologic problems, syncope, and side effects from treatment.  He notes no problems with any antihypertensive medication side effects.        Positive major cardiovascular risk factors include male age 27 years old or older, diabetes, hyperlipidemia, and hypertension.  Negative major cardiovascular risk factors include negative family history for ischemic heart disease and non-tobacco-user status.        Further assessment for target organ damage reveals no history of ASHD, stroke/TIA, or peripheral vascular disease.    Current Medications (verified): 1)  Albertsons Buffered Aspirin 325 Mg  Tabs (Aspirin Buffered) 2)  Levothyroxine Sodium 112 Mcg  Tabs (Levothyroxine Sodium) .... Take One Tablet By Mouth Once A Day 3)  Nexium 40 Mg  Cpdr (Esomeprazole Magnesium) .... Take One Tablert By Mouth Once A Day 4)  Pravastatin Sodium 20 Mg Tabs (Pravastatin Sodium) .... Take 1 Tablet By Mouth Once A Day At Bedtime 5)  Fish Oil 1000 Mg Caps (Omega-3 Fatty Acids)  Allergies (verified): 1)  ! Lipitor  Comments:  Nurse/Medical Assistant: The patient's medications and allergies were reviewed with the patient and were updated in the Medication and Allergy Lists. Avon Gully CMA, Duncan Dull) (November 19, 2010 10:58 AM)  Physical Exam  General:  Well-developed,well-nourished,in no acute distress; alert,appropriate and cooperative throughout examination Head:  Normocephalic and atraumatic without obvious abnormalities. No apparent alopecia or balding. Neck:  No deformities, masses, or tenderness noted. NO TM.  Lungs:  Normal respiratory effort, chest expands symmetrically. Lungs are clear to auscultation, no crackles or wheezes. Heart:  Normal rate and regular rhythm. S1 and S2 normal without gallop, murmur, click, rub or other extra sounds.  Skin:  no rashes.   Cervical Nodes:  No lymphadenopathy noted Psych:  Cognition and judgment appear intact. Alert and cooperative with normal attention span and concentration. No apparent delusions, illusions, hallucinations   Impression & Recommendations:  Problem # 1:  DIABETES MELLITUS, CONTROLLED (ICD-250.00) A1C looks great.  Keep up the good work. I think his weight loss has made a tremendous different.  His updated medication list for this problem includes:    Albertsons Buffered Aspirin 325 Mg Tabs (Aspirin buffered)  Labs Reviewed: Creat: 1.14  (07/15/2010)   Microalbumin: 10 (09/25/2009)  Last Eye Exam: normal (05/25/2010) Reviewed HgBA1c results: 5.9 (07/15/2010)  5.8 (01/28/2010)  Orders: T-Comprehensive Metabolic Panel ([id number]) T-Lipid Profile (09811-91478) T-TSH ([id number])  Problem # 2:  HYPERTENSION (ICD-401.9) Looks great today. Home BPs look very well controlled. I am going to remove HTN from his list.   Problem # 3:  LUNG CANCER, LOWER LOBE (ICD-162.5) Due for yearly CXR. Will check today.  Orders: T-Chest x-ray, 2 views (71020)  Problem # 4:  HYPERLIPIDEMIA (ICD-272.4) Due to recheck today.  His updated medication list for this problem includes:    Pravastatin Sodium 20 Mg Tabs (Pravastatin sodium) .Marland Kitchen... Take 1 tablet by mouth once a day at bedtime  Orders: T-Lipid Profile [phone])  Labs Reviewed: SGOT: 25 (01/28/2010)   SGPT: 21 (01/28/2010)  Lipid Goals: Chol Goal: 200 (01/02/2009)   HDL Goal: 40 (01/02/2009)   LDL Goal: 100 (01/02/2009)   TG Goal: 150 (01/02/2009)  10 Yr Risk Heart Disease: 18 % Prior 10 Yr Risk Heart Disease: 22 % (08/29/2008)   HDL:45 (01/28/2010), 46 (09/25/2009)  LDL:71 (01/28/2010), 105 (28/41/3244)  Chol:126 (01/28/2010), 166 (09/25/2009)  Trig:52 (01/28/2010), 76 (09/25/2009)  Complete Medication List: 1)  Albertsons Buffered Aspirin 325 Mg Tabs (Aspirin buffered) 2)  Levothyroxine Sodium 112 Mcg Tabs (Levothyroxine sodium) .... Take one tablet by mouth once a day 3)  Nexium 40 Mg Cpdr (Esomeprazole magnesium) .... Take one tablert by mouth once a day 4)  Pravastatin Sodium 20 Mg Tabs (Pravastatin sodium) .... Take 1 tablet by mouth once a day at bedtime 5)  Fish Oil 1000 Mg Caps (Omega-3 fatty acids)  Diabetes Management Assessment/Plan:      The following lipid goals have been established for the patient: Total cholesterol goal of 200; LDL cholesterol goal of 100; HDL cholesterol goal of 40; Triglyceride goal of 150.  His blood pressure goal is < 130/80.     Hypertension Assessment/Plan:      The patient's hypertensive risk group is category C: Target organ damage and/or diabetes.  His calculated 10 year risk of coronary heart disease is 18 %.  Today's blood pressure is 138/81.  His blood pressure goal is < 130/80.   Patient Instructions: 1)  Please schedule a follow-up appointment in 4 monthsfor diabetes. I am going to take hypertension off of your problem list since your pressures have looked fantastic.    Orders Added: 1)  T-Chest x-ray, 2 views [71020] 2)  T-Comprehensive Metabolic Panel [[id number]] 3)  T-Lipid Profile [[id number]] 4)  T-TSH [[id number]] 5)  Est. Patient Level IV [44034]   Immunization History:  Influenza Immunization History:    Influenza:  historical (08/31/2010)   Immunization History:  Influenza Immunization History:    Influenza:  Historical (08/31/2010)     Immunization History:  Influenza Immunization History:    Influenza:  historical (08/31/2010)   Last Flu Vaccine:  given (08/14/2009 10:11:59 AM) Flu Vaccine Next Due:  1 yr  Laboratory Results   Urine Tests  Date/Time Received: 11/19/10 Date/Time Reported: 11/19/10  Microalbumin (urine): 10 mg/L Creatinine: 100mg /dL  A:C Ratio 30mg /g  Blood Tests   Date/Time Received: 11/19/10 Date/Time Reported: 11/19/10  HGBA1C: 5.7%   (Normal Range: Non-Diabetic - 3-6%   Control Diabetic - 6-8%)

## 2011-01-03 NOTE — Letter (Signed)
Summary: Marlaine Hind MD Optometrist  Marlaine Hind MD Optometrist   Imported By: Lanelle Bal 06/17/2010 14:11:48  _____________________________________________________________________  External Attachment:    Type:   Image     Comment:   External Document  Appended Document: Marlaine Hind MD Optometrist   Diabetes Management History:      The patient is a 75 years old male who comes in for evaluation of Type 2 Diabetes Mellitus.  He is checking home blood sugars.  He says that he is exercising.  Type of exercise includes: treadmill, bike.  He is doing this 7 times per week.    Diabetes Management Exam:    Eye Exam:       Eye Exam done elsewhere          Date: 05/25/2010          Results: normal  Diabetes Management Assessment/Plan:      The following lipid goals have been established for the patient: Total cholesterol goal of 200; LDL cholesterol goal of 100; HDL cholesterol goal of 40; Triglyceride goal of 150.  His blood pressure goal is < 130/80.

## 2011-02-03 ENCOUNTER — Ambulatory Visit
Admission: RE | Admit: 2011-02-03 | Discharge: 2011-02-03 | Disposition: A | Discharge status: Home / Self Care | Payer: Self-pay | Source: Ambulatory Visit | Attending: Family Medicine | Admitting: Family Medicine

## 2011-02-03 ENCOUNTER — Other Ambulatory Visit: Payer: Self-pay | Admitting: Family Medicine

## 2011-02-03 ENCOUNTER — Ambulatory Visit (INDEPENDENT_AMBULATORY_CARE_PROVIDER_SITE_OTHER): Payer: Medicare Other | Admitting: Family Medicine

## 2011-02-03 ENCOUNTER — Encounter: Payer: Self-pay | Admitting: Family Medicine

## 2011-02-03 DIAGNOSIS — M25511 Pain in right shoulder: Secondary | ICD-10-CM

## 2011-02-03 DIAGNOSIS — M25519 Pain in unspecified shoulder: Secondary | ICD-10-CM

## 2011-02-11 NOTE — Assessment & Plan Note (Signed)
Summary: Rt.Shoulder and arm pain    Vital Signs:  Patient profile:   76 year old male Height:      69 inches Weight:      204.50 pounds BMI:     30.31 O2 Sat:      98 % on Room air Pulse rate:   88 / minute BP sitting:   120 / 76  (right arm) Cuff size:   large  Vitals Entered By: Francee Piccolo CMA Duncan Dull) (February 03, 2011 3:57 PM)  O2 Flow:  Room air CC: right arm and shoulder pain for one week, no known injury...SP Is Patient Diabetic? Yes   Primary Care Provider:  Nani Gasser MD  CC:  right arm and shoulder pain for one week and no known injury...SP.  History of Present Illness: right arm and shoulder pain for one week, no known injury...SP. Stopped exercising his arm for a week and not really helped.  Pain is in the right upper shoulder and occ radiates into the arm. Painful if reaches above his shoulder.  NO hx of shoulder pain or surgery.  No OTC meds.  Sometime will ache  at rest. Not painful to sleep on that shoulder. Nothing really helps his sxs. Says did dislocated one shoulder years ago when had a seizure but doens't remember whic shulder.    Current Medications (verified): 1)  Aspirin 81 Mg Tabs (Aspirin) .... Take 1 Tablet By Mouth Once A Day 2)  Levothyroxine Sodium 112 Mcg  Tabs (Levothyroxine Sodium) .... Take One Tablet By Mouth Once A Day 3)  Pravastatin Sodium 20 Mg Tabs (Pravastatin Sodium) .... Take 1 Tablet By Mouth Once A Day At Bedtime 4)  Fish Oil 1000 Mg Caps (Omega-3 Fatty Acids) .... Take 2 Tablets By Mouth Two Times A Day  Allergies (verified): 1)  ! Lipitor  Past History:  Past Surgical History: Last updated: 07/15/2010 RIGHT MIDDLE AND  LOWER LOBECTOMY  DR. Edwyna Shell 1998 for lung cancer by Dr. Gershon Crane.   NECK SURGERY DR. POOL 05/1999 appendectomy, back cervical, rt. hip replacment - Dr. Charlann Boxer foot surgery, cardiac cath.  Bilat Cat surgery 07/2010 - Dr. Pecola Leisure.   Physical Exam  General:  Well-developed,well-nourished,in no acute  distress; alert,appropriate and cooperative throughout examination Msk:  Right shoulder wtih extension to about 90 degrees. Can reach middle of low back with pain over the outter arm and outer shoulder. No tenderness or swelling of the joint.  No rash.  + empty can test. Stenght 4/5 with extension.  Elbow and wrsit with 5/5 strength.  Pulses:  Right arm with radial pulse 2+    Impression & Recommendations:  Problem # 1:  SHOULDER PAIN, RIGHT (ICD-719.41)  Suspect rotator cuff. Will get xray to look for any sig OA as well or spurs. Exercises given. Aleve two times a day for 5 days.   If not improving consider MRI to further eval for [facility] pathology.  His updated medication list for this problem includes:    Aspirin 81 Mg Tabs (Aspirin) .Marland Kitchen... Take 1 tablet by mouth once a day  Orders: T-DG Shoulder*R* (78469)  Complete Medication List: 1)  Aspirin 81 Mg Tabs (Aspirin) .... Take 1 tablet by mouth once a day 2)  Levothyroxine Sodium 112 Mcg Tabs (Levothyroxine sodium) .... Take one tablet by mouth once a day 3)  Pravastatin Sodium 20 Mg Tabs (Pravastatin sodium) .... Take 1 tablet by mouth once a day at bedtime 4)  Fish Oil 1000 Mg Caps (Omega-3  fatty acids) .... Take 2 tablets by mouth two times a day  Patient Instructions: 1)  Avoid weight lifting. Can do the exercises on the sheet 2)  Can use Aleve two times a day for 5 day with food and water. Stop immediately if any stomach irritation.    Orders Added: 1)  T-DG Shoulder*R* [73030] 2)  Est. Patient Level IV [16109]

## 2011-02-13 ENCOUNTER — Ambulatory Visit: Payer: Medicare Other | Attending: Family Medicine | Admitting: Physical Therapy

## 2011-02-13 DIAGNOSIS — M25619 Stiffness of unspecified shoulder, not elsewhere classified: Secondary | ICD-10-CM | POA: Insufficient documentation

## 2011-02-13 DIAGNOSIS — M25519 Pain in unspecified shoulder: Secondary | ICD-10-CM | POA: Insufficient documentation

## 2011-02-13 DIAGNOSIS — M6281 Muscle weakness (generalized): Secondary | ICD-10-CM | POA: Insufficient documentation

## 2011-02-13 DIAGNOSIS — IMO0001 Reserved for inherently not codable concepts without codable children: Secondary | ICD-10-CM | POA: Insufficient documentation

## 2011-02-17 ENCOUNTER — Ambulatory Visit: Payer: Medicare Other | Admitting: Physical Therapy

## 2011-02-20 ENCOUNTER — Ambulatory Visit: Payer: Medicare Other | Admitting: Physical Therapy

## 2011-02-24 ENCOUNTER — Ambulatory Visit: Payer: Medicare Other | Admitting: Physical Therapy

## 2011-02-26 ENCOUNTER — Ambulatory Visit: Payer: Medicare Other | Admitting: Physical Therapy

## 2011-03-03 ENCOUNTER — Ambulatory Visit: Payer: Medicare Other | Attending: Family Medicine | Admitting: Physical Therapy

## 2011-03-03 DIAGNOSIS — M25519 Pain in unspecified shoulder: Secondary | ICD-10-CM | POA: Insufficient documentation

## 2011-03-03 DIAGNOSIS — M25619 Stiffness of unspecified shoulder, not elsewhere classified: Secondary | ICD-10-CM | POA: Insufficient documentation

## 2011-03-03 DIAGNOSIS — IMO0001 Reserved for inherently not codable concepts without codable children: Secondary | ICD-10-CM | POA: Insufficient documentation

## 2011-03-03 DIAGNOSIS — M6281 Muscle weakness (generalized): Secondary | ICD-10-CM | POA: Insufficient documentation

## 2011-03-06 ENCOUNTER — Ambulatory Visit: Payer: Medicare Other | Admitting: Physical Therapy

## 2011-03-10 ENCOUNTER — Ambulatory Visit: Payer: Medicare Other | Admitting: Physical Therapy

## 2011-03-11 ENCOUNTER — Encounter: Payer: Self-pay | Admitting: Family Medicine

## 2011-03-12 ENCOUNTER — Ambulatory Visit (INDEPENDENT_AMBULATORY_CARE_PROVIDER_SITE_OTHER): Payer: Medicare Other | Admitting: Family Medicine

## 2011-03-12 DIAGNOSIS — E785 Hyperlipidemia, unspecified: Secondary | ICD-10-CM

## 2011-03-12 DIAGNOSIS — E8881 Metabolic syndrome: Secondary | ICD-10-CM

## 2011-03-12 DIAGNOSIS — E119 Type 2 diabetes mellitus without complications: Secondary | ICD-10-CM

## 2011-03-12 DIAGNOSIS — R7309 Other abnormal glucose: Secondary | ICD-10-CM

## 2011-03-12 DIAGNOSIS — E039 Hypothyroidism, unspecified: Secondary | ICD-10-CM

## 2011-03-12 LAB — POCT GLYCOSYLATED HEMOGLOBIN (HGB A1C): Hemoglobin A1C: 5.6

## 2011-03-12 NOTE — Assessment & Plan Note (Signed)
Will cut tab in half and we can recheck LDL in 6 weeks to make sure still under 100 with his hx of DM

## 2011-03-12 NOTE — Assessment & Plan Note (Signed)
Down graded his DM to insulin resistance since his A1C looks fantastic.  Can dec checking sugars daily to checking 3-4 times a week or when he feels bad. Continue with regular exercise and healthy diet. Congratulated her on weight loss.

## 2011-03-12 NOTE — Progress Notes (Signed)
  Subjective:    Patient ID: Steven Everett, male    DOB: 08/13/1935, 76 y.o.   MRN: [id number]  Diabetes He presents for his follow-up diabetic visit. He has type 2 diabetes mellitus. His disease course has been stable. There are no hypoglycemic associated symptoms. Pertinent negatives for diabetes include no blurred vision, no chest pain, no fatigue, no foot ulcerations, no polyuria and no visual change. Symptoms are stable. He participates in exercise daily. His breakfast blood glucose range is generally 90-110 mg/dl. His lunch blood glucose range is generally 110-130 mg/dl. An ACE inhibitor/angiotensin II receptor blocker is not being taken. Eye exam is current.  Thyroid Problem Presents for follow-up visit. Patient reports no fatigue, nail problem, palpitations or visual change. The symptoms have been stable.  Hypertension This is a chronic problem. The problem is unchanged. Pertinent negatives include no blurred vision, chest pain, palpitations or shortness of breath. There are no associated agents to hypertension. Past treatments include nothing. Hypertensive end-organ damage includes a thyroid problem.      Review of Systems  Constitutional: Negative for fatigue.  Eyes: Negative for blurred vision.  Respiratory: Negative for shortness of breath.   Cardiovascular: Negative for chest pain and palpitations.  Genitourinary: Negative for polyuria.       Objective:   Physical Exam  Constitutional: He appears well-developed and well-nourished.  HENT:  Head: Normocephalic and atraumatic.  Cardiovascular: Normal rate and regular rhythm.   Murmur heard.      2/6 SEM.   Pulmonary/Chest: Effort normal and breath sounds normal.  Musculoskeletal: He exhibits no edema.  Skin: Skin is warm and dry.          Assessment & Plan:

## 2011-03-12 NOTE — Patient Instructions (Signed)
Keep up the good work.  Great job!  Diabetes and blood pressures look great!

## 2011-03-12 NOTE — Assessment & Plan Note (Signed)
Due to recheck levels. He is asymptomatic. Will check in 6 weeks when he goes for his LDL check.

## 2011-03-13 ENCOUNTER — Encounter: Payer: Self-pay | Admitting: Family Medicine

## 2011-03-13 ENCOUNTER — Encounter: Payer: Medicare Other | Admitting: Physical Therapy

## 2011-03-14 ENCOUNTER — Telehealth: Payer: Self-pay | Admitting: Family Medicine

## 2011-03-14 NOTE — Telephone Encounter (Signed)
Call patient and let him know if he needs to see Dr. Linford Arnold  Or can he just go downstairs for his labs. Call him back [phone]... Thanks, [employer]

## 2011-03-17 ENCOUNTER — Encounter: Payer: Self-pay | Admitting: Family Medicine

## 2011-03-18 ENCOUNTER — Ambulatory Visit (INDEPENDENT_AMBULATORY_CARE_PROVIDER_SITE_OTHER): Payer: Medicare Other | Admitting: Family Medicine

## 2011-03-18 VITALS — BP 143/82 | HR 68 | Wt 205.0 lb

## 2011-03-18 DIAGNOSIS — M25519 Pain in unspecified shoulder: Secondary | ICD-10-CM

## 2011-03-18 NOTE — Progress Notes (Signed)
  Subjective:    Patient ID: Steven Everett, male    DOB: July 04, 1935, 76 y.o.   MRN: [id number]  HPI Right shoulder still painful. Better than initally . Not improving with rehab. He has completed rehab. He can' t lift weights with arm. Still having pain reaching above his head.    Review of Systems     Objective:   Physical Exam  Musculoskeletal:       Right shoulder with dec abduction to about 120 degrees with pain.  Dec internal rotation. Strength 5/5 but + empty can test on the right.  Nonetnder over the joint itself.           Assessment & Plan:  Right shoulder pain.

## 2011-03-18 NOTE — Patient Instructions (Signed)
You should get a call form [facility] to schedule your MRI

## 2011-03-18 NOTE — Assessment & Plan Note (Signed)
Will schedule an MRI for further evaluation, as he has not improved with PT.  Can continue prn tylenol or NSAIDs for pain relief.  We will call with the results.

## 2011-03-21 ENCOUNTER — Telehealth: Payer: Self-pay | Admitting: *Deleted

## 2011-03-21 NOTE — Telephone Encounter (Signed)
Pt was told by Dr. Linford Arnold to call if he has not heard from our office by  Thursday with MRI appt date/time.  Please advise status of appt.

## 2011-03-29 ENCOUNTER — Other Ambulatory Visit: Payer: Medicare Other

## 2011-03-29 ENCOUNTER — Other Ambulatory Visit: Payer: Self-pay | Admitting: Family Medicine

## 2011-03-29 ENCOUNTER — Ambulatory Visit
Admission: RE | Admit: 2011-03-29 | Discharge: 2011-03-29 | Disposition: A | Discharge status: Home / Self Care | Payer: Medicare Other | Source: Ambulatory Visit | Attending: Family Medicine | Admitting: Family Medicine

## 2011-03-29 DIAGNOSIS — M25519 Pain in unspecified shoulder: Secondary | ICD-10-CM

## 2011-03-31 ENCOUNTER — Telehealth: Payer: Self-pay | Admitting: Family Medicine

## 2011-03-31 DIAGNOSIS — M25519 Pain in unspecified shoulder: Secondary | ICD-10-CM

## 2011-03-31 NOTE — Telephone Encounter (Signed)
MRI of shoulder showed:  1. Moderate supraspinatus tendinopathy. 2. AC joint arthritis.  3. Arthritis of the  glenohumeral joint 4. cannot confidently exclude a posterior labral tear.  I recommed refer to ortho for further evaluation and treatment.

## 2011-04-01 ENCOUNTER — Encounter: Payer: Self-pay | Admitting: Gastroenterology

## 2011-04-01 NOTE — Telephone Encounter (Signed)
Pt's spouse notified wrote down results and will let patient know. Ok to set up with ortho

## 2011-04-15 NOTE — Assessment & Plan Note (Signed)
[facility]                         GASTROENTEROLOGY OFFICE NOTE   OSBORN, PULLIN                        MRN:          [id number]  DATE:06/28/2007                            DOB:          11-Oct-1935    PRIMARY CARE PHYSICIAN:  Leanne Chang, M.D.   GI PROBLEM LIST:  1. Cirrhosis, cryptogenic, likely longstanding fatty liver disease.      Diagnosed in early 2008 with mildly abnormal transaminases,      followed up by CT scan suggesting a cirrhotic liver with irregular      contours. Last imaging January 2008, CT scan without any focal      lesions.  Hepatitis B and C were negative. ANA, AFP, AMA,      antismooth muscle antibody all negative. Iron studies normal. TSH      normal. Normal platelets. Normal coags. Normal albumin. No ascites.      EGD January 02, 2007 showed no varices, Barrett's mucosa was seen      however. Biopsies confirmed intestinal metaplasia with no      dysplasia. He will need repeat EGD in February 2009 for Barrett's      surveillance.   INTERVAL HISTORY:  I last saw Mr. Miron 3-1/2 months ago. Since then, he  has done very well. He feels very healthy. He works in the yard. He has  had no trouble with edema, hematemesis, melena.   CURRENT MEDICATIONS:  1. Synthroid.  2. Aspirin.  3. Zetia.  4. Cinnamon.  5. Protonix 40 mg once daily.   PHYSICAL EXAMINATION:  Weight: 241 pounds, which is 10 pounds less than  he was three months ago. Blood pressure 130/72, pulse 72.  CONSTITUTIONAL: Generally well-appearing.  EYES: Anicteric sclerae.  LUNGS:  Clear to auscultation bilaterally.  ABDOMEN: Soft and nontender, nondistended. Normal bowel sounds. No  ascites.  EXTREMITIES: No lower extremity edema.   ASSESSMENT/PLAN:  A 76 year old man with cryptogenic cirrhosis likely  from a longstanding non-alcoholic steato hepatitis.   Workup summarized above suggests cirrhosis without any decompensation.  He will return to see me  in six months time. At that point we will need  to perform serial imaging as well as serial  AFP. He is continuing to undergo hepatitis A and B immunization and is  due for one last shot soon.     Rachael Fee, MD  Electronically Signed    DPJ/MedQ  DD: 06/28/2007  DT: 06/28/2007  Job #: [id number]   cc:   Leanne Chang, M.D.

## 2011-04-15 NOTE — Assessment & Plan Note (Signed)
[facility]                         GASTROENTEROLOGY OFFICE NOTE   AMADEUS, OYAMA                        MRN:          [id number]  DATE:12/27/2007                            DOB:          11-Oct-1935    PRIMARY CARE PHYSICIAN:  Leanne Chang, M.D.   GI PROBLEM LIST:  1. Cirrhosis, cryptogenic, likely longstanding fatty liver disease.      Diagnosed in early 2008 with mildly abnormal transaminases,      followed up by CT scan suggesting a cirrhotic liver with irregular      contours. Last imaging January 2008, CT scan without any focal      lesions.  Hepatitis B and C were negative. ANA, AFP, AMA,      antismooth muscle antibody all negative. Iron studies normal. TSH      normal. Normal platelets. Normal coags. Normal albumin. No ascites.      EGD January 02, 2007 showed no varices, Barrett's mucosa was seen      however. Biopsies confirmed intestinal metaplasia with no      dysplasia. He will need repeat EGD in February 2009 for Barrett's      surveillance.  CT scan, January 2009, with IV and oral contrast      showed normal liver, no masses (actually no signs of cirrhosis on      the CT scan).  Calcific gallstones were noted.  No biliary      obstruction.  AFP normal.  Liver function tests all normal.  2. Non-erosive GERD.  EGD, February 2008, showed Barrett's without      dysplasia.  Next EGD scheduled for February 2009.   INTERVAL HISTORY:  I last saw Mr. Gironda 3 months ago.  He is doing very  well.  He has no edema.  No encephalopathy.  He had some imaging of his  liver as well as AFP and liver tests done last week and they were all  normal.  Interestingly, the CT scan at this time did not show signs of  cirrhosis, although a previous CAT scan was fairly definitive showing  cirrhosis.   CURRENT MEDICATIONS:  Synthroid, aspirin, Zetia, Prinivil, omeprazole,  levothyroxine.   PHYSICAL EXAMINATION:  VITAL SIGNS:  Weight 255 pounds, blood  pressure  154/88, pulse 96.  CONSTITUTIONAL:  Generally well-appearing.  EYES:  Anicteric sclerae.  LUNGS:  Clear to auscultation bilaterally.  ABDOMEN:  Soft, nontender, nondistended.  Normal bowel sounds.  No  obvious ascites.  EXTREMITIES:  No lower extremity edema.   ASSESSMENT:  A 76 year old man with:  1. Cryptogenic cirrhosis.  2. Gastroesophageal reflux disease.  3. History of Barrett's.   PLAN:  We will arrange for him to have an EGD performed for Barrett  surveillance.  His last colonoscopy was 5.5 years ago, this was by Dr.  Roosvelt Harps.  He took out one polyp that was not adenomatous but the  exam was not complete (he did not get into the cecum).  We will  arrange for him to have a colonoscopy performed at the same time as his  upper endoscopy.  From there I will probably follow him annually for  liver followup.     Rachael Fee, MD  Electronically Signed    DPJ/MedQ  DD: 12/27/2007  DT: 12/27/2007  Job #: [id number]   cc:   Leanne Chang, M.D.

## 2011-04-15 NOTE — Assessment & Plan Note (Signed)
[facility]                         GASTROENTEROLOGY OFFICE NOTE   CASANOVA, SCHURMAN                        MRN:          [id number]  DATE:10/08/2007                            DOB:          February 10, 1935    PRIMARY CARE PHYSICIAN:  Leanne Chang, M.D.   GI PROBLEM LIST:  1. Cirrhosis, cryptogenic, likely longstanding fatty liver disease.      Diagnosed in early 2008 with mildly abnormal transaminases,      followed up by CT scan suggesting a cirrhotic liver with irregular      contours. Last imaging January 2008, CT scan without any focal      lesions.  Hepatitis B and C were negative. ANA, AFP, AMA,      antismooth muscle antibody all negative. Iron studies normal. TSH      normal. Normal platelets. Normal coags. Normal albumin. No ascites.      EGD January 02, 2007 showed no varices, Barrett's mucosa was seen      however. Biopsies confirmed intestinal metaplasia with no      dysplasia. He will need repeat EGD in February 2009 for Barrett's      surveillance.  2. Non-erosive GERD.  EGD, February 2008, showed Barrett's without      dysplasia.  Next EGD scheduled for February 2009.   INTERVAL HISTORY:  I saw Mr. Cohick 4 months ago.  He is here really for  a refill on his antacid medicine, Protonix.  He says it works very well  when he takes it, which he does on a daily basis.  He says if he misses,  he will definitely pyrosis.  He has no dysphagia.  He has been gaining  weight.  He has been having no overt GI bleeding.   CURRENT MEDICATIONS:  Synthroid, aspirin, Zetia, cinnamon, pantoprazole,  Prinivil.   PHYSICAL EXAMINATION:  VITAL SIGNS:  Weight is 245 pounds which is up 4  pounds since his last visit.  Blood pressure 142/82, pulse 70.  CONSTITUTIONAL:  Generally well-appearing.  ABDOMEN:  Soft, nontender, nondistended.  Normal bowel sounds. No  ascites.  EXTREMITIES:  No lower extremities edema.   ASSESSMENT:  A 76 year old man with:  1.  Cryptogenic cirrhosis.  2. Gastroesophageal reflux disease.  3. History of Barrett's without dysplasia.   PLAN:  Mr. Ladouceur has GERD and pantoprazole is working very well for him.  He has no alarm symptoms currently.  I will call in a new prescription  for him.  He is due for a hepatoma screening in January 2009, and I will  arrange for him to have liver testing done, AFP performed,  as well as a triple phase CT scan of his liver in January.  He is also  due for Barrett's surveillance in February 2009, and that is already in  our reminder system.     Rachael Fee, MD  Electronically Signed    DPJ/MedQ  DD: 10/08/2007  DT: 10/08/2007  Job #: [id number]   cc:   Leanne Chang, M.D.

## 2011-04-17 ENCOUNTER — Telehealth: Payer: Self-pay | Admitting: Family Medicine

## 2011-04-17 NOTE — Telephone Encounter (Signed)
Called pt and he was not available but spoke with his wife who said pt had actually went ahead and picked up the script.  He was told prior to cut script in half with dosing and it had pt confused as to why another script was called since dose had been cut in half according to his wife.  Told wife that pt will just have med that last longer and will need to be filled less frequently the next time.  Wife agreed this was fine and that pt was taking the med, but half the amt orig prescribed. Plan:  Routed to Dr. Marlyne Beards, LPN [facility]

## 2011-04-17 NOTE — Telephone Encounter (Signed)
Pt called and said he does not need the med that was called in to the pharmacy.  Pravastatin 20 mg PO #30/3 refills was sent on 03-29-11 through escribe. Plan:  Routed to provider for review and FYI. Jarvis Newcomer, LPN [facility]

## 2011-04-17 NOTE — Telephone Encounter (Signed)
He really should be taking this.  I am not sure  Why he doesn't need it.  I looked back through the notes to see if we had stopped it and I fdon't see anything about it.

## 2011-04-18 NOTE — Op Note (Signed)
Steven Everett, Steven Everett                 ACCOUNT NO.:  [id number]   MEDICAL RECORD NO.:  [id number]          PATIENT TYPE:  AMB   LOCATION:  SDS                          FACILITY:  [facility]   PHYSICIAN:  Henry A. Pool, M.D.    DATE OF BIRTH:  1935-08-24   DATE OF PROCEDURE:  12/02/2005  DATE OF DISCHARGE:                                 OPERATIVE REPORT   SERVICE:  Neurosurgery.   PREOPERATIVE DIAGNOSIS:  Right L4-5 and right L5-S1 stenosis with __________  .   POSTPROCEDURE DIAGNOSIS:  Right L4-5 and right L5-S1 stenosis with  __________ .   PROCEDURE:  Right L4-5 and right L5-S1 decompressive lumbar laminotomies  with foraminotomies.   SURGEON:  Kathaleen Maser. Pool, M.D.   Threasa HeadsYetta Barre.   ANESTHESIA:  General endotracheal anesthesia.   INDICATIONS FOR PROCEDURE:  [name] is a 76 year old male with a history  of back and right lower extremity pain consistent with both a right-sided L5  and S1 radiculopathy who has failed conservative measure.  Workup is evident  of significant spondylosis with associated stenosis at L4-5 and L5-S1, worse  on the right side.  The patient was then counseled as to his options.  He  decided to proceed with a right-sided L4-5 and right-sided L5-S1  decompressive laminotomy with foraminotomy.   OPERATIVE PROCEDURE:  The patient was taken to the operating room and placed  in the supine position.  After general endotracheal anesthesia was achieved,  the patient was positioned prone onto a Wilson frame and appropriately  padded for lumbar surgery.  Prepped and draped sterilely, and skin incision  overlying the L4-5 and S1 levels.  This was carried down sharply in the  midline.  A subperiosteal dissection was performed exposing the lamina of  the facet joints of L4, L5, and S1.  Deep self-retaining retractor was  placed.  Intraoperative x-ray was taken and the level was confirmed.  The  laminotomy was then performed using the high speed drill and  Kerrison  rongeurs used in the inferior aspect of the lamina of L4, medial aspect of  the L4-5 facet joint, and the superior rim of the L5 lamina.  The L5-S1  laminotomy was approached in a similar fashion again removing the inferior  aspect of the lamina of L5, medial aspect of the L5-S1 facet joint, and the  superior aspect of the S1 lamina.  The ligamentum flavum was then elevated  and resected in the usual fashion using Kerrison rongeurs.  Next, the thecal  sac and exiting L5 and S1 nerve roots were identified.  Widely decompressed  foraminotomy was then performed along the course of the exiting nerve roots  at each level.  A complete hemilaminectomy was completed at L5 to fully  decompress the canal at this level.  At this point, a very thorough  decompression had been achieved.  There was no injury to the thecal sac or  nerve roots.  The wound was then irrigated with antibiotic solution.  Gelfoam was placed and hemostasis was found to be good.  The microscope and  retractors were then removed.  Hemostasis of the muscle  was achieved with electrocautery.  The wound was then closed in layers with  Vicryl sutures.  Steri-Strips and a sterile dressing were applied.  There  were no apparent complications.  The patient was returned to the recovery  room postoperatively.           ______________________________  Kathaleen Maser Pool, M.D.     HAP/MEDQ  D:  12/02/2005  T:  12/02/2005  Job:  [id number]

## 2011-04-18 NOTE — Assessment & Plan Note (Signed)
[facility]                         GASTROENTEROLOGY OFFICE NOTE   EDMAN, LIPSEY                        MRN:          [id number]  DATE:01/06/2007                            DOB:          1935-03-02    PRIMARY CARE PHYSICIAN:  Dr. Leanne Chang.   GASTROINTESTINAL PROBLEM LIST:  1. Cirrhosis, cryptogenic, likely longstanding fatty liver disease.      Diagnosed in early 2008 with mildly abnormal transaminases,      followed up by CT scan suggesting a cirrhotic liver with irregular      contours. Last imaging January 2008, CT scan without any focal      lesions.  Hepatitis B and C were negative. ANA, AFP, AMA,      antismooth muscle antibody all negative. Iron studies normal. TSH      normal. Normal platelets. Normal coags. Normal albumin. No ascites.   INTERVAL HISTORY:  I last saw Mr. Ida about a month ago for initial  workup of chronic liver disease. Since then he has felt very well. Lab  tests and imaging studies are summarized above.   CURRENT MEDICATIONS:  Synthroid, Zantac, aspirin, Januvia, Zetia, and  cinnamon.   PHYSICAL EXAMINATION:  Weight is 246 pounds, which is 1 pound less than  he weighed a month ago, blood pressure 122/72, pulse 88.  CONSTITUTIONAL: Generally well-appearing.  ABDOMEN: Soft, nontender, nondistended, normal bowel sounds.  EXTREMITIES: No lower extremity edema.   ASSESSMENT AND PLAN:  A 76 year old man with cryptogenic cirrhosis  likely longstanding nonalcoholic steatohepatitis (patient has been life-  long obese, recently diagnosed with diabetes).   Lab tests summarized above show no obvious cause for his liver disease  so I suspect this is longstanding fatty liver disease. He has normal  synthetic function by lab tests and normal synthetic function by  physical exam (no ascites).  Should screen him for varices and so will  arrange for him to have an EGD in the next few weeks. He has had a  colonoscopy, I  believe this was 3 to 4 years ago done at [facility], he  is not sure exactly who did that but they told him he did not need  follow up for many years. We will try to track that down. He will return  to see me in 2 to 3 months, and sooner if needed. Lastly, we will start  him on hepatitis A and B immunization series.     Rachael Fee, MD  Electronically Signed   DPJ/MedQ  DD: 01/06/2007  DT: 01/06/2007  Job #: [id number]   cc:   Leanne Chang, M.D.

## 2011-04-18 NOTE — Assessment & Plan Note (Signed)
[facility]                         GASTROENTEROLOGY OFFICE NOTE   BROCKTON, MCKESSON                        MRN:          [id number]  DATE:12/14/2006                            DOB:          08/14/1935    REFERRING PHYSICIAN:  Leanne Chang, M.D.   NEW GASTROENTEROLOGY CONSULTATION:   REASON FOR REFERRAL:  Dr. Blossom Hoops asked me to evaluate Mr. Deeley at  consultation regarding abnormal liver tests, abnormal imaging.   HISTORY OF PRESENT ILLNESS:  Mr. Fife is a very pleasant 76 year old  man who has never had problems with his liver.  Specifically, he has  never had hepatitis or jaundice, never been told he had liver troubles,  until most recently when he was discovered to have some abnormal liver  tests.  Specifically, his ALT was 71 and his AST was 55.  He was sent  for imaging studies.  A CT scan done last week with IV and oral contrast  suggests cirrhotic liver, based on irregular contours, without any focal  lesions.   REVIEW OF SYSTEMS:  Notable for a 40-pound weight loss in the past  several months; he is intentionally trying to lose weight.  He is  working out more since being diagnosed with diabetes.  The rest of his  review of systems is essentially normal and it is available on his  nursing intake sheet.   PAST MEDICAL HISTORY:  1. Lung cancer, status post partial lobectomy with chemotherapy and      radiation following; this was 10 years ago.  He has had no      recurrence.  2. Diabetes.  3. Elevated cholesterol.  4. Thyroid trouble.  5. Back surgery.  6. Hip fractures.   CURRENT MEDICINES:  Synthroid, Zantac, aspirin, Januvia, Zetia and  cinnamon.   ALLERGIES:  No known drug allergies.   SOCIAL HISTORY:  Married.  One son.  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  Nondrinker, nonsmoker.   FAMILY HISTORY:  No colon cancer or colon polyps.  No liver cancer or  liver disease in family.   PHYSICAL EXAMINATION:  Five feet 11 inches, 247 pounds.  Blood pressure  152/88, pulse 80.  CONSTITUTIONAL:  Generally well-appearing.  NEUROLOGIC:  Alert and oriented x3.  Anicteric sclerae.  NECK:  Supple.  No lymphadenopathy.  LUNGS:  Clear to auscultation bilaterally.  HEART:  Regular rate and rhythm.  LUNGS:  Clear to auscultation bilaterally.  ABDOMEN:  Soft, nontender and non-distended.  No obvious ascites.  Obese  abdomen.  EXTREMITIES:  No lower extremity edema.  SKIN:  No rash or lesions on visible extremities.   ASSESSMENT AND PLAN:  [age]-year-old man with mildly elevated  transaminases and CT suggesting cirrhosis.   He has no hepatitis C risk factors (no old tattoos, no known blood  transfusions, no exposure to blood products).  Perhaps this is  longstanding nonalcoholic steatohepatitis, eventually culminating in  cirrhosis.  He is obese and he has always been quite heavy.  Synthetically, it seems like he has a very normally functioning liver.  I will  get a CBC and a complete metabolic profile as well as  coagulations to help determine whether he indeed he has normal synthetic  function.  He will also have blood sent for hepatitis B and C, ANA, AMA  (antismooth muscle antibody), iron studies and thyroid testing.  He will  return to see me in 3 weeks and sooner if needed.     Rachael Fee, MD  Electronically Signed    DPJ/MedQ  DD: 12/14/2006  DT: 12/15/2006  Job #: [id number]   cc:   Leanne Chang, M.D.
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
## 2011-04-18 NOTE — Consult Note (Signed)
NAME:  Steven Everett, Steven Everett                 ACCOUNT NO.:  [id number]   MEDICAL RECORD NO.:  [id number]          PATIENT TYPE:  REC   LOCATION:  FOOT                         FACILITY:  [facility]   PHYSICIAN:  Theresia Majors. Tanda Rockers, M.D.DATE OF BIRTH:  23-Mar-1935   DATE OF CONSULTATION:  04/09/2006  DATE OF DISCHARGE:                                   CONSULTATION   REASON FOR CONSULTATION:  Mr. Burright is a 76 year old man referred by Dr.  Marny Lowenstein from [facility] for evaluation of ulcerations on his right  lower extremity.   IMPRESSION:  Second degree blisters secondary to postoperative swelling and  reactionary edema.   RECOMMENDATIONS:  Topical debridement with multilevel compression wrap and  serial exams.   SUBJECTIVE:  Mr. Goldring is a 76 year old man who recently underwent a hip  replacement.  He had an unremarkable convalescence with the exception of  moderate swelling in his lower extremities.  On the right lower extremity,  he noticed a blister several days ago.  These partly spontaneously ruptured.  He denied fever.  He did not recognize any antecedent trauma.   PAST MEDICAL HISTORY:  1.  Diabetes mellitus.  2.  Peripheral edema.  3.  Hypothyroidism.   ALLERGIES:  He denies allergies.   CURRENT MEDICATIONS:  1.  Levothyroxine 100 mcg daily.  2.  Zantac 150 mg daily.  3.  Vicodin ES p.r.n.  4.  Coumadin 5 mg daily.  5.  Robaxin 500 q.i.d.  6.  Juneva 100 mg daily.  7.  Flexeril 10 mg daily.  8.  Nitrofurantoin 100 mg daily.  9.  Doxazosin 4 mg one day a week.   PAST SURGICAL HISTORY:  1.  Right lobectomy for lung cancer in 1998.  2.  Compression fracture of T1 and 2 in 2007.   FAMILY HISTORY:  Positive for hypertension, diabetes, stroke and heart  attack.   SOCIAL HISTORY:  He is married.  He is retired.  [place]   REVIEW OF SYSTEMS:  Remarkable for some mild dyspnea on exertion.  He denies  chest pain. His weight has been  stable.  He does have a tendency to retain  fluid.  This is felt to be related to his thyroid function.  He has been  recently seeing the urologist for obstructive symptoms and has an indwelling  catheter.  The remainder of the review of systems is negative.   PHYSICAL EXAMINATION:  GENERAL APPEARANCE:  He is an alert and oriented male  in no acute distress.  He is accompanied by his wife.  VITAL SIGNS:  Blood pressure 142/80, pulse rate 92 and regular, respirations  24 and he is afebrile.  HEENT:  Clear.  NECK:  Supple.  Trachea is midline.  Thyroid is not palpable.  LUNGS:  Clear.  CARDIOVASCULAR:  Heart sounds are distant.  ABDOMEN:  Soft.  EXTREMITIES:  There is bilateral 3+ edema with no evidence of alopecia.  There are chronic changes of stasis with hyperpigmentation bilaterally.  On  the right lower extremity, there are multiple second degree  blisters,  several of which have ruptured.  The ones on the anterior medial aspect have  ruptured on the lateral aspect of the foot.  There are several smaller  blisters which were debrided of all necrotic tissue.  NEUROLOGIC:  The patient has normal perception.  There is no evidence of  ascending infection.  The pedal pulses are not palpable due to the intense  edema but the capillary refill is normal and there is no physical evidence  of significant ischemia.   DISCUSSION:  Mr. Bok has developed blisters associated with his edema.  His edema could have been related to his post surgical changes or to intense  fluid mobilization attendant with his reaction to the trauma surgery.  At  any rate, these wounds do not appear to be infected and there are no  systemic signs of bacterial invasion.  We will elect to treat him with  multilayered compression therapy.  We have explained this approach to the  patient and his wife in terms that they seem to understand.  We anticipate  that these wounds will make dramatic improvement once the  peripheral edema  is controlled with the use of the compression wrap.  Hopefully, we will make  a transition to bilateral below the knee compression hose.  Patient seems to  understand, expressed his gratitude for having been seen in the wound center  and indicates that he will be compliant with the above instructions.           ______________________________  Theresia Majors Tanda Rockers, M.D.     Cephus Slater  D:  04/09/2006  T:  04/10/2006  Job:  [id number]   cc:   Sharlet Salina, M.D.  Fax: [phone]

## 2011-04-18 NOTE — Assessment & Plan Note (Signed)
[facility]                         GASTROENTEROLOGY OFFICE NOTE   Steven Everett, Steven Everett                        MRN:          [id number]  DATE:03/05/2007                            DOB:          [birth year]    PRIMARY CARE PHYSICIAN:  Leanne Chang, M.D.   GI PROBLEM LIST:  1. Cirrhosis, cryptogenic, likely longstanding fatty liver disease.      Diagnosed in early 2008 with mildly abnormal transaminases,      followed up by CT scan suggesting a cirrhotic liver with irregular      contours. Last imaging January 2008, CT scan without any focal      lesions.  Hepatitis B and C were negative. ANA, AFP, AMA,      antismooth muscle antibody all negative. Iron studies normal. TSH      normal. Normal platelets. Normal coags. Normal albumin. No ascites.      EGD January 02, 2007 showed no varices, Barrett's mucosa was seen      however. Biopsies confirmed intestinal metaplasia with no      dysplasia. He will need repeat EGD in February 2009 for Barrett's      surveillance.   INTERVAL HISTORY:  I last saw Steven Everett about a month ago at the time of  his EGD. Since then he has been doing very well. I put him on a once  daily proton pump inhibitor and he has been taking that and he says on  that his heartburn symptoms are completely resolved. The biopsies of  Barrett's-appearing mucosa confirmed intestinal metaplasia with no  dysplasia.   CURRENT MEDICATIONS:  1. Synthroid.  2. PPI once daily.  3. Aspirin.  4. Zetia.  5. Cinnamon.   PHYSICAL EXAMINATION:  VITAL SIGNS:  Weight 251 pounds, blood pressure  110/80, pulse 78.  CONSTITUTIONAL:  Generally well appearing.  EYES:  Anicteric sclera.  LUNGS:  Clear to auscultation bilaterally.  ABDOMEN:  Soft, nontender, nondistended. Normal bowel sounds.  EXTREMITIES:  No lower extremity edema.   ASSESSMENT/PLAN:  A 76 year old man with cryptogenic cirrhosis likely  from longstanding nonalcoholic  steatohepatitis.   We discussed Barrett's surveillance and he agrees to repeat EGD in  February 2009. His gastroesophageal reflux disease symptoms are very  well controlled on once daily proton pump  inhibitor and he will continue that indefinitely. He shows no signs of  decompensated liver disease. He will return to see me in 3 months' time  and sooner if needed.     Rachael Fee, MD  Electronically Signed    DPJ/MedQ  DD: 03/05/2007  DT: 03/05/2007  Job #: [id number]   cc:   Leanne Chang, M.D.
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
## 2011-04-18 NOTE — Op Note (Signed)
Steven Everett NO.:  [id number]   MEDICAL RECORD NO.:  [id number]          PATIENT TYPE:  INP   LOCATION:  1508                         FACILITY:  [facility]   PHYSICIAN:  Steven Frankel. Charlann Everett, M.D.  DATE OF BIRTH:  1935/02/04   DATE OF PROCEDURE:  03/24/2006  DATE OF DISCHARGE:                                 OPERATIVE REPORT   PREOPERATIVE DIAGNOSIS:  Right hip osteoarthritis.   POSTOPERATIVE DIAGNOSIS:  Right hip osteoarthritis.   PROCEDURE:  Right total hip replacement.   COMPONENTS USED:  DePuy hip system with a size 54 ASR cup, a Summit size 8  high-offset femoral stem with a 47 ASR ball with a +8 adaptor.   SURGEON:  Steven Frankel. Charlann Everett, M.D.   ASSISTANTDruscilla Brownie. Cherlynn June.   ANESTHESIA:  General.   BLOOD REPLACED:  250 cc.   DRAINS:  One.   COMPLICATIONS:  None.   INDICATIONS FOR PROCEDURE:  Steven Everett is a 76 year old gentleman who  presented to the office as a referral for evaluation for right hip pain.  He  has had a history of lumbar spine surgery with persistent anterolateral  right hip pain and right thigh pain.  Radiographs revealed, as did an MRI  prior to his evaluation and referral, severe osteoarthritis of the hip.  Upon review with him of nonoperative versus operative interventions, he  wished to proceed with surgical intervention, but he did not wish to have  any temporary measures performed.  We discussed bearing surfaces, in terms  of metal-on-metal, ceramics with available head sizes including the larger-  ball technology which he was interested in pursuing.  After discussing the  risks and benefits of hip surgery, including infection, DVT, dislocation,  component failure, need for revision, consent was obtained for right total  hip replacement, metal-on-metal bearing surface with primary option being  ASR technology.   PROCEDURE IN DETAIL:  The patient was brought to the operative theater.  Once adequate anesthesia and  preoperative antibiotics, 2 g of Ancef were  administered, the patient was positioned in the left lateral decubitus  position with the right side up.  The right lower extremity was then prepped  and draped in a sterile fashion following a prescrub.  The lateral based  incision was made for a posterior approach to the hip.  Gluteus fascia and  iliotibial band were incised in line with the incision.  The short external  rotators were taken down separate from the posterior capsule.  A capsulotomy  was created.  The posterior capsule was saved for protection against the  sciatic nerve and then later repaired to the superior leaflet.  The hip was  dislocated and severe degenerative changes noted in the femoral and  acetabular components.   At this point, a neck osteotomy was made based off the tip of the trocar.  The patient's anatomic position on his lateral hip indicated a valgus neck  with the head centered proximal to the trochanter.  Preoperative examination  indicated the patient was approximately a 5 to 10 mm short on the  right side  compared to the left.  This was due to the erosive changes in the femoral  head and acetabulum.   With the patient in the down and lateral decubitus position prior to  prepping, analysis of the patient's lower extremity was carried out to  determine and be able to compare against intraoperatively.   Following the neck osteotomy, attention was first directed to the femur.  The primary plan was to use a Trilock-type stem.  Upon initial broaching and  preparation of the femur, there was a concern about the size of the  prosthesis that would fit and there was somewhat of a mismatch from the  diaphysis to the metaphyseal region.  Upon broaching up to a size 15, needed  to jump to 17.5, I switched over to use the Summit stem.  This would allow  for distal reaming followed by broaching and better fixation with less risk  of complications distally.   While we  were pulling up the Summit system which was available in the  hospital and did not take any extra time, attention was directed to the  acetabulum.  The acetabulum was exposed in routine fashion including  labrectomy.  Reaming commenced with a 45 reamer and using minimally invasive  reamers, I reamed up to a 54 reamer with excellent bony purchase and  excellent bony bed.  Based on his age, bone quality and size, I reamed up to  a 53 and placed a 54 ASR cup.  A final 54 ASR cup was then impacted to the  level of the broaching as visualized around the broach handle.   At this point, I confirmed this location which was beneath the anterior tone  in approximately 15 to 20 degrees of forward flexion at the level of the  teardrop with about 35 degrees of abduction.  Following this, attention was  redirected to the femur.  At this point, based on the lack of stems  available for the Trilock, I went ahead and reamed axially with the conical  reamers for the Summit system.  I reamed up to a size 8 to the tip of the  trochanter.  I then passed the size 6 broach, the 7 broach, and then the 8  broach.  This had excellent purchase.  A high-offset neck was initially  tried with a +2 adaptor.  There was at least 4 to 5 mm of shuck present with  this, so I decided to try a +5.  Even though this had a little bit of shuck  with internal rotation to about 70 degrees with neutral abduction, 80  degrees of flexion, there was a little bit of subluxation.  I went ahead and  tried the +8.  The +8 appeared to have the center of the head above the tip  of the trochanter as I was hoping and the hip stability shored up and felt  better than that with the +5, even with just a single millimeter or two of  offset.  There was a about a millimeter of shuck.  The soft tissues appeared  to be tensioned appropriately.  There was no impingement with external rotation and extension and, importantly, the leg lengths compared to  the  down leg appeared to be equal, as was the +2 trial and noted that it was  shortened.   At this point, trial components were removed.  The final Summit high-offset  size 8 stem was then impacted to the level of the  neck cut and the final +8  adaptor with a 47 ASR ball was impacted onto a clean and dry trunion.   The hip was copiously irrigated throughout the case and again at this point.  The hip was reduced.  The posterior capsular tissue was reapproximated with  the superior leaflet and debrided as necessary.  A medium Hemovac drain was  placed deep within the wound.  The remainder of the wound was closed in  layers with #1 Ethibond on the  iliotibial band, #1 Vicryl on the gluteal fascia.  The skin was closed in  layers with subcuticular stitch.  The hip was cleaned, dried, and dressed  sterilely with Adaptic dressing and tape.  The patient was extubated and  brought to the recovery room in stable condition.      Steven Everett, M.D.  Electronically Signed     MDO/MEDQ  D:  03/24/2006  T:  03/25/2006  Job:  [id number]

## 2011-04-18 NOTE — Discharge Summary (Signed)
Steven Everett, Steven Everett NO.:  [id number]   MEDICAL RECORD NO.:  [id number]          PATIENT TYPE:  INP   LOCATION:  1508                         FACILITY:  [facility]   PHYSICIAN:  Steven Frankel. Charlann Everett, M.D.  DATE OF BIRTH:  07/26/1935   DATE OF ADMISSION:  03/24/2006  DATE OF DISCHARGE:  03/27/2006                                 DISCHARGE SUMMARY   ADMITTING DIAGNOSES:  1.  Osteoarthritis right hip.  2.  Reflux.  3.  History of lung cancer.   DISCHARGE DIAGNOSES:  1.  Osteoarthritis right hip.  2.  Reflux.  3.  History of lung cancer.  4.  Mild postoperative anemia.  5.  Mild postoperative hyponatremia.   CONSULTS:  None.   OPERATION:  On March 24, 2006, the patient underwent right total hip  replacement arthroplasty utilizing the DePuy hip system.  Steven Campus,  PA-C assisted.   BRIEF HISTORY:  This 76 year old gentleman with continuing problems  concerning his right hip.  He has had lumbar spine surgery and developed  persistent anterolateral right hip pain and proximal anterior thigh pain.  MRI as well as regular x-rays have shown osteoarthritis of the right hip.  The patient wanted to go straight ahead with surgical intervention due to  his pain and discomfort.  He did not want to go through the conservative  approach since it was well documented that he did have thi osteoarthritis  and his pain was interfering with his day-to-day activities.  After  discussing the risks and benefits of surgery the patient was scheduled for  the above procedure.   COURSE IN THE HOSPITAL:  The patient tolerated the surgical procedure quite  well, worked diligently with physical therapy.  We weaned him off the PCA.  He continued with his incentive spirometer.  Wound remained dry and  neurovascular remained intact to the operative extremity.  We supplied him  with home equipment that he would need for safety as well as home health.  Once all these were in place and the  patient was stable, it was felt he  could be maintained in his home environment and arrangements were made for  discharge.  The wound was clean and dry.  Steri-Strips were intact.  Laboratory values in the hospital hematologically showed a CBC with  differential completely within normal limits.  Preoperative hemoglobin was  16.1 with hematocrit 49.5.  Final hemoglobin was 12.1, hematocrit was 36.0.  He did have a very mild hyponatremia at 134; otherwise, electrolytes were  completely within normal limits.  Urinalysis negative for urinary tract  infection.  No chest x-ray nor electrocardiogram seen on this chart.   CONDITION ON DISCHARGE:  Improved, stable.   PLAN:  The patient discharged to his home in the care of his family.  He is  to follow up with Dr. Marny Lowenstein and Dr. Jordan Likes as indicated.  Return to see Dr.  Charlann Everett 2 weeks after the date of surgery.  May shower 4 days from surgery.  Use  dry dressing p.r.n.  Continue with home medications and diet.  For  pain he  was given Vicodin one or two q.4-6h. p.r.n. pain and for muscle spasms, he  will have Robaxin 500 mg one tablet q.6h. muscle spasm, Coumadin per  pharmacy for 4 weeks after date of surgery, use over-the-counter laxative on  a p.r.n. basis.      Steven Everett.      Steven Everett, M.D.  Electronically Signed    DLU/MEDQ  D:  04/09/2006  T:  04/10/2006  Job:  [id number]   cc:   Sharlet Salina, M.D.  Fax: [phone]   Kathaleen Maser. Pool, M.D.  Fax: [phone]

## 2011-04-18 NOTE — H&P (Signed)
Steven Everett, Steven Everett                 ACCOUNT NO.:  [id number]   MEDICAL RECORD NO.:  [id number]          PATIENT TYPE:  INP   LOCATION:  NA                           FACILITY:  [facility]   PHYSICIAN:  Madlyn Frankel. Charlann Boxer, M.D.  DATE OF BIRTH:  [birth year]   DATE OF ADMISSION:  03/24/2006  DATE OF DISCHARGE:                                HISTORY & PHYSICAL   CHIEF COMPLAINT:  Pain in my right hip.   HISTORY OF PRESENT ILLNESS:  76 year old white male seen by [place] for  continuing problems concerning pain into his right groin and hip.  He is an  active gentleman and has had more and more problems with his hip.  He has  gained weight due to the fact that he cannot exercise and is really not  having a very good life.  X-rays have shown advanced degenerative changes in  the right hip.  He also had an MRI from March of this year which further  illustrates right hip osteoarthritis.   The patient also has degenerative disc disease in the lumbar spine but with  the groin pain and x-rays it is felt that, indeed, he does have a specific  problem concerning his hip.  After much discussion including the risks and  benefits of surgery it is felt he would benefit from surgical intervention  and is being admitted for total  hip replacement arthroplasty.   PRIMARY CARE PHYSICIANS:  Sharlet Salina, M.D. is the family physician.  Henry A. Pool, M.D. is his neurologist.   PAST MEDICAL HISTORY:  Patient also has reflux disease, difficulty with  lying flat, shortness of breath during exertion.  He had lung cancer with  excision of the right lower lobe in 1998.  Herniated disc was done in 2000.  He had lower lumbar compression fracture in January of 2007.   ALLERGIES:  The patient has no medical allergies.   MEDICATIONS:  He did not bring nor list his medications today and please see  nursing intake notes for those medications.   FAMILY HISTORY:  Positive for hypertension in the mother as well as diabetes  and breast cancer in mother.  Father had lung cancer as well.   SOCIAL HISTORY:  The patient is married, retired, no intake of alcohol or  tobacco products. Has one child. His wife, Eber Jones, will be his caregiver  after surgery.  He lives in a one story house.   REVIEW OF SYSTEMS:  CNS:  No seizure disorder, paralysis, double vision.  The patient does have radiculitis from lumbar nerve roots down the right  lower extremity.  RESPIRATORY:  No hemoptysis The patient has some shortness  of breath with extreme exertion.  CARDIOVASCULAR:  No chest pain.  No  angina, no orthopnea. GASTROINTESTINAL:  No nausea, vomiting, melena or  bloody stools. GENITOURINARY:  No discharge, dysuria or hematuria.  MUSCULOSKELETAL:  Primarily in present illness.   PHYSICAL EXAMINATION:  GENERAL APPEARANCE:  Alert, cooperative, friendly 32-  year-old white male who is accompanied by his wife.  VITAL SIGNS:  Blood pressure 150/82, pulse 72,  respirations 12.  HEENT:  Normocephalic, pupils equal, round, reactive to light and  accommodation. Extraocular movements intact.  Oropharynx is clear.  CHEST:  Clear to auscultation.  No rales, rhonchi or wheezing.  HEART:  Regular rate and rhythm.  No murmurs are heard.  ABDOMEN:  Soft, nontender.  Obese.  Liver and spleen not felt.  GENITOURINARY/RECTAL:  Not done, not pertinent to present illness.  EXTREMITIES:  Painful range of motion of the right hip, most specifically on  internal and external rotation.   ADMISSION DIAGNOSES:  1.  Osteoarthritis right hip.  2.  Reflux.  3.  History of lung cancer.   PLAN:  The patient will undergo right total hip replacement arthroplasty.  His wife and home health will probably be taking care of him after his  regular hospitalization.   The patient was to be on jury duty during his recovery from his total hip  replacement arthroplasty.  The jury duty was scheduled for Apr 15, 2006.  Today, I wrote a note with an excuse for them  to mail to the appropriate  authorities to excuse him from jury duty as he will be in recovery and  rehabilitation for three to six months.      Dooley L. Cherlynn June.      Madlyn Frankel Charlann Boxer, M.D.  Electronically Signed    DLU/MEDQ  D:  03/18/2006  T:  03/18/2006  Job:  [id number]   cc:   Sharlet Salina, M.D.  Fax: [phone]   Kathaleen Maser. Pool, M.D.  Fax: [phone]

## 2011-04-23 ENCOUNTER — Ambulatory Visit: Payer: Medicare Other | Admitting: Family Medicine

## 2011-04-24 ENCOUNTER — Telehealth: Payer: Self-pay | Admitting: Family Medicine

## 2011-04-24 LAB — TSH: TSH: 1.342 u[IU]/mL (ref 0.350–4.500)

## 2011-04-24 NOTE — Telephone Encounter (Signed)
Call patient: Thyroid, LDL, hemoglobin A1c all look fantastic. Keep followup appointment.

## 2011-04-25 NOTE — Telephone Encounter (Signed)
Left message on vm with results  

## 2011-05-01 NOTE — Telephone Encounter (Signed)
Don't understand why I have this encounter so made this note then will close encounter. Jarvis Newcomer, LPN Domingo Dimes

## 2011-05-09 ENCOUNTER — Other Ambulatory Visit (INDEPENDENT_AMBULATORY_CARE_PROVIDER_SITE_OTHER): Payer: Medicare Other

## 2011-05-09 ENCOUNTER — Ambulatory Visit (INDEPENDENT_AMBULATORY_CARE_PROVIDER_SITE_OTHER): Payer: Medicare Other | Admitting: Gastroenterology

## 2011-05-09 ENCOUNTER — Encounter: Payer: Self-pay | Admitting: Gastroenterology

## 2011-05-09 VITALS — BP 110/80 | HR 100 | Ht 71.0 in | Wt 204.0 lb

## 2011-05-09 DIAGNOSIS — K7460 Unspecified cirrhosis of liver: Secondary | ICD-10-CM

## 2011-05-09 DIAGNOSIS — K2270 Barrett's esophagus without dysplasia: Secondary | ICD-10-CM

## 2011-05-09 LAB — PROTIME-INR
INR: 1.2 ratio — ABNORMAL HIGH (ref 0.8–1.0)
Prothrombin Time: 13.2 s — ABNORMAL HIGH (ref 10.2–12.4)

## 2011-05-09 LAB — CBC WITH DIFFERENTIAL/PLATELET
Basophils Relative: 0.3 % (ref 0.0–3.0)
Eosinophils Relative: 4.5 % (ref 0.0–5.0)
HCT: 45.6 % (ref 39.0–52.0)
Lymphs Abs: 1.4 10*3/uL (ref 0.7–4.0)
MCV: 86.9 fl (ref 78.0–100.0)
Monocytes Absolute: 0.4 10*3/uL (ref 0.1–1.0)
Monocytes Relative: 7.3 % (ref 3.0–12.0)
Neutrophils Relative %: 61.7 % (ref 43.0–77.0)
Platelets: 162 10*3/uL (ref 150.0–400.0)
RBC: 5.24 Mil/uL (ref 4.22–5.81)
WBC: 5.4 10*3/uL (ref 4.5–10.5)

## 2011-05-09 LAB — COMPREHENSIVE METABOLIC PANEL
Albumin: 4.1 g/dL (ref 3.5–5.2)
BUN: 15 mg/dL (ref 6–23)
CO2: 31 mEq/L (ref 19–32)
GFR: 57.33 mL/min — ABNORMAL LOW (ref >60.00)
Glucose, Bld: 108 mg/dL — ABNORMAL HIGH (ref 70–99)
Potassium: 4.7 mEq/L (ref 3.5–5.1)
Sodium: 140 mEq/L (ref 135–145)
Total Protein: 7.3 g/dL (ref 6.0–8.3)

## 2011-05-09 NOTE — Patient Instructions (Addendum)
You will be set up for an upper endoscopy for Barrett's surveillance. You will be set up for an ultrasound to screen for hepatoma given your cirrhosis.  [facility] Tuesday 05/12/10 arrive 745 am.  Nothing to eat or drink after midnight. You will have labs checked today in the basement lab.  Please head down after you check out with the front desk  (cbc, cmet, inr, AFP). Continue low salt diet, avoid alcohol and NSAID type over the counter pain meds. A copy of this information will be made available to Dr. Linford Arnold.

## 2011-05-09 NOTE — Progress Notes (Signed)
Review of pertinent gastrointestinal problems: 1. Cirrhosis, cryptogenic, likely longstanding fatty liver disease.     Diagnosed in early 2008 with mildly abnormal transaminases,     followed up by CT scan suggesting a cirrhotic liver with irregular     contours. Last imaging January 2008, CT scan without any focal     lesions.  Hepatitis B and C were negative. ANA, AFP, AMA,     antismooth muscle antibody all negative. Iron studies normal. TSH     normal. Normal platelets. Normal coags. Normal albumin. No ascites.     EGD January 02, 2007 showed no varices, CT scan, January 2009, with IV and oral contrast     showed normal liver, no masses (actually no signs of cirrhosis on     the CT scan).  Calcific gallstones were noted.  No biliary     obstruction.  AFP normal.  Liver function tests all normal. 2. Non-erosive GERD.  EGD, February 2008, showed Barrett's without     dysplasia.  Next EGD scheduled for February 2009.EGD 01/2008 found irregular GE junction wihtout Barrett's on path. Recall at 3 years. 3. routine risk for colon cancer. Colonoscopy 2009 found no polyps. Recommended to have repeat examination at 10 year interval  HPI: This is a  very pleasant 76 year old man whom I last saw over 3 years ago. He was in our system for a Barrett's surveillance reminder and I recommended he come into the office first so we couldn't see how he is doing clinically from a Barrett's perspective, GERD perspective, cirrhosis perspective.  He is actually feeling very well. He has no dysphagia  No fluid troubles, no overt GI bleeding. He is intentionally losing weight with exercise, portion control.  He takes NO antiacid meds.   Review of systems: Pertinent positive and negative review of systems were noted in the above HPI section.  All other review of systems was otherwise negative.   Past Medical History, Past Surgical History, Family History, Social History, Current Medications, Allergies were all  reviewed with the patient via [facility] electronic medical record system.   Physical Exam: BP 110/80  Pulse 100  Ht 5\' 11"  (1.803 m)  Wt 204 lb (92.534 kg)  BMI 28.45 kg/m2 Constitutional: generally well-appearing Psychiatric: alert and oriented x3 Eyes: extraocular movements intact Mouth: oral pharynx moist, no lesions Neck: supple no lymphadenopathy Cardiovascular: heart regular rate and rhythm Lungs: clear to auscultation bilaterally Abdomen: soft, nontender, nondistended, no obvious ascites, no peritoneal signs, normal bowel sounds Extremities: no lower extremity edema bilaterally Skin: no lesions on visible extremities    Assessment and plan: 76 y.o. male with well compensated cirrhosis, history of Barrett's esophagus  It is generally recommended to screen for hepatoma once or twice yearly in patients with cirrhosis and so we will set him up with abdominal ultrasound and alpha-fetoprotein level. He will also get a basic set of liver tests to check his overall synthetic function which must be pretty good given how well he looks clinically. He has had Barrett's metaplasia noted in the past. We will set him up for repeat EGD at his soonest convenience.

## 2011-05-12 ENCOUNTER — Encounter: Payer: Self-pay | Admitting: Gastroenterology

## 2011-05-12 ENCOUNTER — Ambulatory Visit (AMBULATORY_SURGERY_CENTER): Payer: Medicare Other | Admitting: Gastroenterology

## 2011-05-12 VITALS — BP 123/69 | HR 77 | Temp 97.1°F | Resp 16 | Ht 71.0 in | Wt 205.0 lb

## 2011-05-12 DIAGNOSIS — K2270 Barrett's esophagus without dysplasia: Secondary | ICD-10-CM

## 2011-05-12 DIAGNOSIS — K449 Diaphragmatic hernia without obstruction or gangrene: Secondary | ICD-10-CM

## 2011-05-12 DIAGNOSIS — K2990 Gastroduodenitis, unspecified, without bleeding: Secondary | ICD-10-CM

## 2011-05-12 DIAGNOSIS — K319 Disease of stomach and duodenum, unspecified: Secondary | ICD-10-CM

## 2011-05-12 LAB — GLUCOSE, CAPILLARY: Glucose-Capillary: 90 mg/dL (ref 70–99)

## 2011-05-12 MED ORDER — SODIUM CHLORIDE 0.9 % IV SOLN: 500.0000 mL | INTRAVENOUS | Status: DC

## 2011-05-12 NOTE — Progress Notes (Signed)
PATIENT STATES HE DOES NOT TAKE ANY MEDS FOR HIS DIABETES- HE IS DIET CONTROLLED ALTHOUGH HE DOES CHECK HIS BLOOD SUGAR AT HOME

## 2011-05-12 NOTE — Patient Instructions (Signed)
FOLLOW BLUE & GREEN DISCHARGE SHEETS GIVEN TO YOU  INFORMATION ON HIATAL HERNIA & BARRETT'S ESOPHAGUS GIVEN TO YOU

## 2011-05-13 ENCOUNTER — Telehealth: Payer: Self-pay | Admitting: *Deleted

## 2011-05-13 ENCOUNTER — Other Ambulatory Visit (HOSPITAL_COMMUNITY): Payer: Medicare Other

## 2011-05-13 NOTE — Telephone Encounter (Signed)
Message left

## 2011-05-20 ENCOUNTER — Ambulatory Visit (HOSPITAL_COMMUNITY)
Admission: RE | Admit: 2011-05-20 | Discharge: 2011-05-20 | Disposition: A | Discharge status: Home / Self Care | Payer: Medicare Other | Source: Ambulatory Visit | Attending: Gastroenterology | Admitting: Gastroenterology

## 2011-05-20 DIAGNOSIS — Q619 Cystic kidney disease, unspecified: Secondary | ICD-10-CM | POA: Insufficient documentation

## 2011-05-20 DIAGNOSIS — K7460 Unspecified cirrhosis of liver: Secondary | ICD-10-CM | POA: Insufficient documentation

## 2011-05-20 DIAGNOSIS — K8020 Calculus of gallbladder without cholecystitis without obstruction: Secondary | ICD-10-CM | POA: Insufficient documentation

## 2011-05-20 DIAGNOSIS — C3490 Malignant neoplasm of unspecified part of unspecified bronchus or lung: Secondary | ICD-10-CM | POA: Insufficient documentation

## 2011-05-29 ENCOUNTER — Other Ambulatory Visit: Payer: Self-pay | Admitting: Family Medicine

## 2011-05-29 MED ORDER — LEVOTHYROXINE SODIUM 112 MCG PO TABS: 112.0000 ug | ORAL_TABLET | Freq: Every day | ORAL | Status: DC

## 2011-06-09 ENCOUNTER — Other Ambulatory Visit: Payer: Self-pay | Admitting: Family Medicine

## 2011-06-10 ENCOUNTER — Other Ambulatory Visit: Payer: Self-pay | Admitting: Family Medicine

## 2011-06-10 NOTE — Telephone Encounter (Signed)
Request for RF of levothyroxine 112 mcg. Pt chart reviewed and the RX RF was sent electronically on 05-29-11 #30/1 RF. Jarvis Newcomer, LPN Domingo Dimes

## 2011-07-15 ENCOUNTER — Ambulatory Visit (INDEPENDENT_AMBULATORY_CARE_PROVIDER_SITE_OTHER): Payer: Medicare Other | Admitting: Family Medicine

## 2011-07-15 ENCOUNTER — Encounter: Payer: Self-pay | Admitting: Family Medicine

## 2011-07-15 DIAGNOSIS — E039 Hypothyroidism, unspecified: Secondary | ICD-10-CM

## 2011-07-15 DIAGNOSIS — E785 Hyperlipidemia, unspecified: Secondary | ICD-10-CM

## 2011-07-15 MED ORDER — DESONIDE 0.05 % EX LOTN: TOPICAL_LOTION | CUTANEOUS | Status: DC

## 2011-07-15 NOTE — Progress Notes (Signed)
  Subjective:    Patient ID: Steven Everett, male    DOB: 13-Jun-1935, 76 y.o.   MRN: [id number]  HPI  Thyroid - no skin or hair changes. No recent weight changes. Taking his meds regularly. Takes in the AM before breakfast.    Lipids- No muscle weakness. Tolerating it well. Due ot recheck levels today. He is taking half a tab.  He spliits the pills.   Review of Systems     Objective:   Physical Exam  Constitutional: He is oriented to person, place, and time. He appears well-developed and well-nourished.  HENT:  Head: Normocephalic and atraumatic.  Neck: Neck supple. No thyromegaly present.  Cardiovascular: Normal rate, regular rhythm and normal heart sounds.   Pulmonary/Chest: Effort normal and breath sounds normal.  Genitourinary: Guaiac positive stool.  Lymphadenopathy:    He has no cervical adenopathy.  Neurological: He is alert and oriented to person, place, and time.  Skin: Skin is warm and dry.  Psychiatric: He has a normal mood and affect. His behavior is normal.          Assessment & Plan:  Wanst a refill on desonide lotion he uses for itchey ear. Lst MD prescribed and says it works really well.

## 2011-07-15 NOTE — Assessment & Plan Note (Signed)
Due to recheck  Levels. Tolerating well.

## 2011-07-15 NOTE — Assessment & Plan Note (Signed)
Due to check level today.  Asymptomatic.

## 2011-07-16 ENCOUNTER — Telehealth: Payer: Self-pay | Admitting: Family Medicine

## 2011-07-16 LAB — LIPID PANEL
Cholesterol: 126 mg/dL (ref 0–200)
HDL: 48 mg/dL (ref >39)
Total CHOL/HDL Ratio: 2.6 Ratio
VLDL: 8 mg/dL (ref 0–40)

## 2011-07-16 LAB — TSH: TSH: 1.62 u[IU]/mL (ref 0.350–4.500)

## 2011-07-16 NOTE — Telephone Encounter (Signed)
Pt notidfeid of results. KJ LPN

## 2011-07-16 NOTE — Telephone Encounter (Signed)
Call patient: Thyroid and cholesterol look fantastic.

## 2011-07-26 ENCOUNTER — Other Ambulatory Visit: Payer: Self-pay | Admitting: Family Medicine

## 2011-08-29 ENCOUNTER — Other Ambulatory Visit: Payer: Self-pay | Admitting: Family Medicine

## 2011-11-13 ENCOUNTER — Encounter: Payer: Self-pay | Admitting: Family Medicine

## 2011-11-18 ENCOUNTER — Ambulatory Visit (INDEPENDENT_AMBULATORY_CARE_PROVIDER_SITE_OTHER): Payer: Medicare Other | Admitting: Family Medicine

## 2011-11-18 ENCOUNTER — Ambulatory Visit
Admission: RE | Admit: 2011-11-18 | Discharge: 2011-11-18 | Disposition: A | Discharge status: Home / Self Care | Payer: Medicare Other | Source: Ambulatory Visit | Attending: Family Medicine | Admitting: Family Medicine

## 2011-11-18 ENCOUNTER — Encounter: Payer: Self-pay | Admitting: Family Medicine

## 2011-11-18 DIAGNOSIS — E039 Hypothyroidism, unspecified: Secondary | ICD-10-CM

## 2011-11-18 DIAGNOSIS — Z85118 Personal history of other malignant neoplasm of bronchus and lung: Secondary | ICD-10-CM

## 2011-11-18 DIAGNOSIS — Z23 Encounter for immunization: Secondary | ICD-10-CM

## 2011-11-18 DIAGNOSIS — E785 Hyperlipidemia, unspecified: Secondary | ICD-10-CM

## 2011-11-18 LAB — LIPID PANEL
LDL Cholesterol: 79 mg/dL (ref 0–99)
VLDL: 12 mg/dL (ref 0–40)

## 2011-11-18 LAB — TSH: TSH: 1.018 u[IU]/mL (ref 0.350–4.500)

## 2011-11-18 MED ORDER — KETOCONAZOLE 2 % EX SHAM: MEDICATED_SHAMPOO | CUTANEOUS | Status: AC

## 2011-11-18 NOTE — Progress Notes (Signed)
  Subjective:    Patient ID: Steven Everett, male    DOB: 1935-01-15, 76 y.o.   MRN: [id number]  HPI Here to f/u on cholesterol. He is not taking half a tab. He wants to recheck his level on half a tab.    Also f/u thyroid. Says he feels great. No complaints. He is working out daily   Due for repeat CXR.    Review of Systems     Objective:   Physical Exam  Constitutional: He is oriented to person, place, and time. He appears well-developed and well-nourished.  HENT:  Head: Normocephalic and atraumatic.  Cardiovascular: Normal rate, regular rhythm and normal heart sounds.   Pulmonary/Chest: Effort normal and breath sounds normal.  Musculoskeletal: He exhibits no edema.  Neurological: He is alert and oriented to person, place, and time.  Skin: Skin is warm and dry.  Psychiatric: He has a normal mood and affect. His behavior is normal.          Assessment & Plan:  Hyperlipidemia - Discussed will recheck today.   Hyothyroid - Will recheck level today.  Dye for thyroid.   Tdap given today.   Hx of Lung Ca- Will get CXR today.

## 2011-11-27 ENCOUNTER — Telehealth: Payer: Self-pay | Admitting: *Deleted

## 2011-11-27 NOTE — Telephone Encounter (Signed)
Pt states he stopped his pravastatin 1 mth prior to blood work and doesn't want to restart since blood work was normal.  MA, LPN

## 2011-11-27 NOTE — Telephone Encounter (Signed)
OK, I removed med from her list and removed hyperlipidemai from his problems list.

## 2011-12-26 ENCOUNTER — Other Ambulatory Visit: Payer: Self-pay | Admitting: Family Medicine

## 2012-03-15 DIAGNOSIS — M76899 Other specified enthesopathies of unspecified lower limb, excluding foot: Secondary | ICD-10-CM | POA: Diagnosis not present

## 2012-04-12 DIAGNOSIS — H43819 Vitreous degeneration, unspecified eye: Secondary | ICD-10-CM | POA: Diagnosis not present

## 2012-04-12 DIAGNOSIS — H35439 Paving stone degeneration of retina, unspecified eye: Secondary | ICD-10-CM | POA: Diagnosis not present

## 2012-04-24 ENCOUNTER — Other Ambulatory Visit: Payer: Self-pay | Admitting: Family Medicine

## 2012-05-18 ENCOUNTER — Encounter: Payer: Self-pay | Admitting: Family Medicine

## 2012-05-18 ENCOUNTER — Ambulatory Visit (INDEPENDENT_AMBULATORY_CARE_PROVIDER_SITE_OTHER): Payer: Medicare Other | Admitting: Family Medicine

## 2012-05-18 VITALS — BP 136/81 | HR 78 | Wt 204.0 lb

## 2012-05-18 DIAGNOSIS — K2270 Barrett's esophagus without dysplasia: Secondary | ICD-10-CM | POA: Diagnosis not present

## 2012-05-18 DIAGNOSIS — R7309 Other abnormal glucose: Secondary | ICD-10-CM | POA: Diagnosis not present

## 2012-05-18 DIAGNOSIS — E039 Hypothyroidism, unspecified: Secondary | ICD-10-CM

## 2012-05-18 LAB — BASIC METABOLIC PANEL WITH GFR
CO2: 29 mEq/L (ref 19–32)
Calcium: 9.7 mg/dL (ref 8.4–10.5)
GFR, Est African American: 79 mL/min
Potassium: 5.2 mEq/L (ref 3.5–5.3)
Sodium: 139 mEq/L (ref 135–145)

## 2012-05-18 LAB — TSH: TSH: 0.915 u[IU]/mL (ref 0.350–4.500)

## 2012-05-18 LAB — HEMOGLOBIN A1C
Hgb A1c MFr Bld: 5.6 % (ref <5.7)
Mean Plasma Glucose: 114 mg/dL (ref <117)

## 2012-05-18 NOTE — Progress Notes (Addendum)
  Subjective:    Patient ID: Steven Everett, male    DOB: [birth year], 77 y.o.   MRN: [id number]  HPI Still working out daily.  No CP or SOB. Feels like meds working well. No skin or hair change.  No weight changes.  Feels thyroid med is therapeutic.  Has had some eye floaters but is being followed by eye doc. Has f/u in 2 weeks.     Review of Systems     Objective:   Physical Exam  Constitutional: He is oriented to person, place, and time. He appears well-developed and well-nourished.  HENT:  Head: Normocephalic and atraumatic.  Cardiovascular: Normal rate, regular rhythm and normal heart sounds.   Pulmonary/Chest: Effort normal and breath sounds normal.       2/6 SEM  Neurological: He is alert and oriented to person, place, and time.  Skin: Skin is warm and dry.  Psychiatric: He has a normal mood and affect. His behavior is normal.          Assessment & Plan:  Hypothyroid - Doing well.  Asymptomatic.  Will check TSH as well.    Cirrhosis and bareets esoph followed by GI. Last appt was June 2012. He is not on PPI. Says his sxs are well controlled. Will check with GI if supposed to be on a PPI.   Abnormal glucose - will check glucose and HgA1C.

## 2012-05-18 NOTE — Patient Instructions (Signed)
We will call you with your lab results. If you don't here from us in about a week then please give us a call at [phone].  

## 2012-05-19 ENCOUNTER — Telehealth: Payer: Self-pay | Admitting: Family Medicine

## 2012-05-19 MED ORDER — OMEPRAZOLE 40 MG PO CPDR: 40.0000 mg | DELAYED_RELEASE_CAPSULE | Freq: Every day | ORAL | Status: DC

## 2012-05-19 NOTE — Telephone Encounter (Signed)
Left detailed message on vm.

## 2012-05-19 NOTE — Telephone Encounter (Signed)
Call patient: Please tell him that I discussed with Dr. Christella Hartigan his gastroenterologist that he really does need to be on a proton pump inhibitor. Given that he is not having any reflux symptoms and his symptoms are very well-controlled he does still have a diagnosis of Barrett's esophagus which can eventually turn into esophageal cancer. Thus he really has to be on a PPI to prevent this from happening. I will send over a prescription for omeprazole to his pharmacy.

## 2012-05-20 DIAGNOSIS — H43819 Vitreous degeneration, unspecified eye: Secondary | ICD-10-CM | POA: Diagnosis not present

## 2012-05-24 ENCOUNTER — Other Ambulatory Visit: Payer: Self-pay | Admitting: Family Medicine

## 2012-06-20 ENCOUNTER — Emergency Department (HOSPITAL_COMMUNITY)
Admission: EM | Admit: 2012-06-20 | Discharge: 2012-06-20 | Disposition: A | Discharge status: Home / Self Care | Payer: Medicare Other | Attending: Emergency Medicine | Admitting: Emergency Medicine

## 2012-06-20 ENCOUNTER — Encounter (HOSPITAL_COMMUNITY): Payer: Self-pay | Admitting: Family Medicine

## 2012-06-20 DIAGNOSIS — I10 Essential (primary) hypertension: Secondary | ICD-10-CM | POA: Insufficient documentation

## 2012-06-20 DIAGNOSIS — K219 Gastro-esophageal reflux disease without esophagitis: Secondary | ICD-10-CM | POA: Insufficient documentation

## 2012-06-20 DIAGNOSIS — Z85118 Personal history of other malignant neoplasm of bronchus and lung: Secondary | ICD-10-CM | POA: Insufficient documentation

## 2012-06-20 DIAGNOSIS — Z87891 Personal history of nicotine dependence: Secondary | ICD-10-CM | POA: Diagnosis not present

## 2012-06-20 DIAGNOSIS — E039 Hypothyroidism, unspecified: Secondary | ICD-10-CM | POA: Diagnosis not present

## 2012-06-20 DIAGNOSIS — E119 Type 2 diabetes mellitus without complications: Secondary | ICD-10-CM | POA: Diagnosis not present

## 2012-06-20 DIAGNOSIS — R300 Dysuria: Secondary | ICD-10-CM | POA: Diagnosis not present

## 2012-06-20 DIAGNOSIS — N390 Urinary tract infection, site not specified: Secondary | ICD-10-CM | POA: Insufficient documentation

## 2012-06-20 DIAGNOSIS — E785 Hyperlipidemia, unspecified: Secondary | ICD-10-CM | POA: Diagnosis not present

## 2012-06-20 LAB — POCT I-STAT, CHEM 8
Glucose, Bld: 118 mg/dL — ABNORMAL HIGH (ref 70–99)
HCT: 46 % (ref 39.0–52.0)
Hemoglobin: 15.6 g/dL (ref 13.0–17.0)
Potassium: 4.2 mEq/L (ref 3.5–5.1)
Sodium: 140 mEq/L (ref 135–145)

## 2012-06-20 LAB — URINE MICROSCOPIC-ADD ON

## 2012-06-20 LAB — URINALYSIS, ROUTINE W REFLEX MICROSCOPIC
Bilirubin Urine: NEGATIVE
Protein, ur: 30 mg/dL — AB
Urobilinogen, UA: 1 mg/dL (ref 0.0–1.0)

## 2012-06-20 MED ORDER — CIPROFLOXACIN HCL 500 MG PO TABS
500.0000 mg | ORAL_TABLET | Freq: Once | ORAL | Status: AC
  Administered 2012-06-20: 500 mg via ORAL
  Filled 2012-06-20: qty 1

## 2012-06-20 MED ORDER — PHENAZOPYRIDINE HCL 100 MG PO TABS
200.0000 mg | ORAL_TABLET | Freq: Once | ORAL | Status: AC
  Administered 2012-06-20: 200 mg via ORAL
  Filled 2012-06-20: qty 2

## 2012-06-20 MED ORDER — PHENAZOPYRIDINE HCL 200 MG PO TABS: 200.0000 mg | ORAL_TABLET | Freq: Three times a day (TID) | ORAL | Status: AC

## 2012-06-20 MED ORDER — CIPROFLOXACIN HCL 500 MG PO TABS: 500.0000 mg | ORAL_TABLET | Freq: Two times a day (BID) | ORAL | Status: AC

## 2012-06-20 NOTE — ED Provider Notes (Signed)
History     CSN: [id number]  Arrival date & time 06/20/12  1478   First MD Initiated Contact with Patient 06/20/12 1020      11:00 AM HPI Patient reports UTI symptoms that began yesterday. States similar to prior UTI he has had in the past. Symptoms include dysuria, frequency, urgency. Patient denies fever, vomiting, back pain, penile discharge. Patient is a 77 y.o. male presenting with dysuria. The history is provided by the patient.  Dysuria  This is a new problem. The current episode started yesterday. The problem occurs every urination. The problem has been gradually worsening. The quality of the pain is described as burning. The pain is moderate. There has been no fever. Associated symptoms include frequency and urgency. Pertinent negatives include no chills, no nausea, no vomiting, no hematuria and no flank pain. He has tried nothing for the symptoms.    Past Medical History  Diagnosis Date  . Hemorrhoids, internal   . Diverticulosis of colon   . Barrett's esophagus   . Cirrhosis of liver     w/o mention of alcohol  . Cancer     lung, lower lobe  . Seborrheic dermatitis   . Hyperlipidemia   . Hypothyroidism   . Hypertension   . GERD (gastroesophageal reflux disease)   . Diabetes mellitus     type 2  . Rheumatic fever     Past Surgical History  Procedure Date  . Right middle and lower lobectomy 1998    Dr Edwyna Shell- for lung cancer by Dr Gershon Crane  . Neck surgery 6-00    Dr Jordan Likes  . Appendectomy   . Back cervical   . Right hip replacement     Dr Charlann Boxer  . Foot surgery   . Cardiac catheterization   . Bilat cat surgery 8-11    Dr Pecola Leisure  . Colonoscopy     Family History  Problem Relation Age of Onset  . Cancer Mother     breast  . Diabetes Mother   . Cancer Father     Lung  . Diabetes      History  Substance Use Topics  . Smoking status: Former Games developer  . Smokeless tobacco: Never Used  . Alcohol Use: No      Review of Systems  Constitutional: Negative  for fever and chills.  Respiratory: Negative for shortness of breath.   Cardiovascular: Negative for chest pain.  Gastrointestinal: Negative for nausea, vomiting, abdominal pain, diarrhea, constipation, blood in stool and rectal pain.  Genitourinary: Positive for dysuria, urgency and frequency. Negative for hematuria, flank pain, discharge, penile pain and testicular pain.  Musculoskeletal: Negative for back pain.  Neurological: Negative for dizziness, weakness, numbness and headaches.  All other systems reviewed and are negative.    Allergies  Atorvastatin  Home Medications   Current Outpatient Rx  Name Route Sig Dispense Refill  . OMEGA-3 FATTY ACIDS 1000 MG PO CAPS Oral Take 2 g by mouth daily. Take two tablets daily    . LEVOTHYROXINE SODIUM 112 MCG PO TABS Oral Take 112 mcg by mouth daily.    Marland Kitchen OMEPRAZOLE 40 MG PO CPDR Oral Take 1 capsule (40 mg total) by mouth daily. 90 capsule 3    BP 128/71  Pulse 102  Temp 98 F (36.7 C)  Resp 16  SpO2 100%  Physical Exam  Vitals reviewed. Constitutional: He is oriented to person, place, and time. He appears well-developed and well-nourished.  HENT:  Head: Normocephalic and atraumatic.  Eyes:  Conjunctivae are normal. Pupils are equal, round, and reactive to light.  Neck: Normal range of motion. Neck supple.  Cardiovascular: Normal rate, regular rhythm and normal heart sounds.   Pulmonary/Chest: Effort normal and breath sounds normal.  Abdominal: Soft. Bowel sounds are normal. He exhibits no distension and no mass. There is no tenderness. There is no rebound and no guarding.  Neurological: He is alert and oriented to person, place, and time.  Skin: Skin is warm and dry. No rash noted. No erythema. No pallor.  Psychiatric: He has a normal mood and affect. His behavior is normal.    ED Course  Procedures   Results for orders placed during the hospital encounter of 06/20/12  URINALYSIS, ROUTINE W REFLEX MICROSCOPIC       Component Value Range   Color, Urine AMBER (*) YELLOW   APPearance CLOUDY (*) CLEAR   Specific Gravity, Urine 1.025  1.005 - 1.030   pH 7.0  5.0 - 8.0   Glucose, UA NEGATIVE  NEGATIVE mg/dL   Hgb urine dipstick LARGE (*) NEGATIVE   Bilirubin Urine NEGATIVE  NEGATIVE   Ketones, ur 40 (*) NEGATIVE mg/dL   Protein, ur 30 (*) NEGATIVE mg/dL   Urobilinogen, UA 1.0  0.0 - 1.0 mg/dL   Nitrite POSITIVE (*) NEGATIVE   Leukocytes, UA LARGE (*) NEGATIVE  GLUCOSE, CAPILLARY      Component Value Range   Glucose-Capillary 121 (*) 70 - 99 mg/dL   Comment 1 Documented in Chart     Comment 2 Notify RN    URINE MICROSCOPIC-ADD ON      Component Value Range   Squamous Epithelial / LPF RARE  RARE   WBC, UA 21-50  <3 WBC/hpf   RBC / HPF 21-50  <3 RBC/hpf   Bacteria, UA MANY (*) RARE  POCT I-STAT, CHEM 8      Component Value Range   Sodium 140  135 - 145 mEq/L   Potassium 4.2  3.5 - 5.1 mEq/L   Chloride 102  96 - 112 mEq/L   BUN 9  6 - 23 mg/dL   Creatinine, Ser 4.09  0.50 - 1.35 mg/dL   Glucose, Bld 811 (*) 70 - 99 mg/dL   Calcium, Ion 9.14  7.82 - 1.30 mmol/L   TCO2 26  0 - 100 mmol/L   Hemoglobin 15.6  13.0 - 17.0 g/dL   HCT 95.6  21.3 - 08.6 %    MDM   Patient's labs indicate a UTI. I have also ordered a urine culture, i-STAT chem 8, ciprofloxacin and a Pyridium tablet here in the day. Patient does appear to be in acute distress do not feel patient needs any further laboratory studies. Advised return for worsening symptoms such as abdominal pain, back pain, fever. Patient voices understanding and is ready for discharge      Thomasene Lot, Cordelia Poche 06/20/12 1251

## 2012-06-20 NOTE — ED Notes (Signed)
Per pt he has had dysuria and frequency since yesterday.

## 2012-06-21 NOTE — ED Provider Notes (Signed)
Medical screening examination/treatment/procedure(s) were conducted as a shared visit with non-physician practitioner(s) and myself.  I personally evaluated the patient during the encounter. UTI. Patient is well appearing. D/c with follow up  Juliet Rude. Rubin Payor, MD 06/21/12 1347

## 2012-06-22 LAB — URINE CULTURE

## 2012-06-23 NOTE — ED Notes (Signed)
+   urine Patient treated with Cipro-sensitive to same-chart appended per protocol MD. 

## 2012-07-02 ENCOUNTER — Encounter: Payer: Self-pay | Admitting: *Deleted

## 2012-07-02 NOTE — Progress Notes (Signed)
[facility] Clinical Social Work  Clinical Social Work met with Mr. Yellowhair, Mr. Charters spouse and son at cancer center to complete healthcare advance directives. Mr. Liendo chose to only complete the healthcare living will.  CSW will send notarized documents to medical records to be scanned into his chart.  To access his directives, go to Chart Review > Media Tab > under Advance Directives/Clinical Social Work.  Kathrin Penner, MSW, [facility] Clinical Social Worker [facility] [phone]

## 2012-07-28 ENCOUNTER — Other Ambulatory Visit: Payer: Self-pay | Admitting: Family Medicine

## 2012-07-30 ENCOUNTER — Encounter: Payer: Self-pay | Admitting: Family Medicine

## 2012-07-30 ENCOUNTER — Ambulatory Visit (INDEPENDENT_AMBULATORY_CARE_PROVIDER_SITE_OTHER): Payer: Medicare Other | Admitting: Family Medicine

## 2012-07-30 VITALS — BP 121/77 | HR 83 | Temp 97.5°F | Resp 16 | Wt 192.0 lb

## 2012-07-30 DIAGNOSIS — M216X9 Other acquired deformities of unspecified foot: Secondary | ICD-10-CM

## 2012-07-30 DIAGNOSIS — M21371 Foot drop, right foot: Secondary | ICD-10-CM

## 2012-07-30 NOTE — Patient Instructions (Signed)
Common Peroneal Nerve Entrapment with Rehab The peroneal nerve and its branches are responsible for muscle control of the muscles that extend to the toes, foot, ankle. This nerve is also responsible for sensation on the outer side of the lower leg and foot. Injury to the peroneal nerve results in problems with sensation and muscle control in these areas. Injury to the peroneal nerve often occurs in the area where the nerve passes around the top of one of the lower leg bones (fibular head). The nerve becomes trapped, causing pain, tingling, numbness, or burning sensations. SYMPTOMS   Pain, tingling, numbness, or burning on the top of the foot, ankle, or outer part of the lower leg.   Pain that gets worse with physical activity (walking, running, squatting).   Weakness when lifting the foot, including moving the ankle and toes upward (foot drop), or turning the foot outward with walking.   Problems walking (having to lift the foot high) or running, including tripping over the foot.   Inflammation, bruising (contusion), and tenderness near the outer part of the knee (or just below the knee).  CAUSES  Common peroneal nerve entrapment is caused by pressure being placed on the peroneal nerve. This pressure may occur due to direct contact (being tackled at the knees), inflammation, a cyst in the knee, or a healing fracture around the knee. Less commonly, peroneal nerve entrapment may be caused by a stretch injury (knee sprain) or with swelling in the leg (compartment syndrome). RISK INCREASES WITH:  Recurring foot, ankle, or knee sprains.   Playing sports on uneven ground, which may result in knee or ankle sprains.   Direct injury (trauma) to the knee.  PREVENTION  Warm up and stretch properly before activity.   Maintain physical fitness:   Strength, flexibility, and endurance.   Cardiovascular fitness.   Wear properly fitted and padded protective equipment.  PROGNOSIS  Common peroneal  nerve entrapment is often curable with non-surgical treatment. Often symptoms will go away on their own (spontaneously). Sometimes, surgery is needed to relieve pressure from the nerve.  RELATED COMPLICATIONS   Permanent pain, tingling, numbness, or weakness of the affected foot, ankle, and leg.   Inability to compete, due to pain or weakness.   Injury to other parts of the body, as a result of repeated tripping and falling over the foot.  TREATMENT Treatment first involves resting from any activities that cause the symptoms to get worse. The use of ice and medicine may reduce pain and inflammation. If ice is used, do not place it directly on the skin. Instead, place a towel in-between. If there is weakness of the muscles, causing foot drop, bracing the ankle and foot may be needed. It is important to perform strengthening and stretching exercises to maintain muscle strength. These exercises may be completed at home or with a therapist. If pain continues to get worse, despite treatment, or a cyst is present, surgery may be needed to relieve the pressure on the nerve. If the pressure on the nerve is due to compartment syndrome, a fascial (sheet of connective tissue) release may need to be performed. The earlier surgery is performed, the better your chances of full recovery.  MEDICATION   If pain medicine is needed, nonsteroidal anti-inflammatory medicines (aspirin and ibuprofen), or other minor pain relievers (acetaminophen), are often advised.   Do not take pain medicine for 7 days before surgery.   Prescription pain relievers may be given if your caregiver thinks they are needed. Use  only as directed and only as much as you need.  COLD THERAPY   Cold treatment (icing) should be applied for 10 to 15 minutes every 2 to 3 hours for inflammation and pain, and immediately after activity that aggravates your symptoms. Use ice packs or an ice massage.  SEEK MEDICAL CARE IF:   Symptoms get worse.    Symptoms do not improve in 2 weeks, despite treatment.   New, unexplained symptoms develop. (Drugs used in treatment may produce side effects.)  EXERCISES RANGE OF MOTION (ROM) AND STRETCHING EXERCISES - Common Peroneal Nerve Entrapment These exercises may help you when beginning to rehabilitate your injury. Your symptoms may go away with or without further involvement from your physician, physical therapist or athletic trainer. While completing these exercises, remember:   Restoring tissue flexibility helps normal motion to return to the joints. This allows healthier, less painful movement and activity.   An effective stretch should be held for at least 30 seconds.   A stretch should never be painful. You should only feel a gentle lengthening or release in the stretched tissue.  RANGE OF MOTION - Ankle Eversion   Sit with your right / left ankle crossed over your opposite knee.   Grip your foot with your opposite hand, placing your thumb on the top of your foot and your fingers across the bottom of your foot.   Gently push your foot downward with a slight rotation, so your littlest toes rise slightly toward the ceiling.   You should feel a gentle stretch on the inside of your ankle. Hold the stretch for __________ seconds.  Repeat __________ times. Complete this exercise __________ times per day.  RANGE OF MOTION - Ankle Inversion   Sit with your right / left ankle crossed over your opposite knee.   Grip your foot with your opposite hand, placing your thumb on the bottom of your foot and your fingers across the top of your foot.   Gently pull your foot so the smallest toe comes toward you and your thumb pushes the inside of the ball of your foot away from you.   You should feel a gentle stretch on the outside of your ankle. Hold the stretch for __________ seconds.  Repeat __________ times. Complete this exercise __________ times per day.  RANGE OF MOTION - Ankle Dorsiflexion,  Active Assisted   Remove your shoes and sit on a chair, preferably not on a carpeted surface.   Place your right / left foot directly under your knee. Extend your opposite leg for support.   Keeping your heel down, slide your right / left foot back toward the chair, until you feel a stretch at your ankle or calf. If you do not feel a stretch, slide your bottom forward to the edge of the chair, while still keeping your heel down.   Hold this stretch for __________ seconds.  Repeat __________ times. Complete this stretch __________ times per day.  STRETCH - Gastroc, Standing   Place your hands on a wall.   Extend your right / left leg behind you, and place a folded washcloth under the arch of your foot for support. Keep the front knee somewhat bent.   Slightly point your toes inward on your back foot.   Keeping your right / left heel on the floor and your knee straight, shift your weight toward the wall, not allowing your back to arch.   You should feel a gentle stretch in the right / left  calf. Hold this position for __________ seconds.  Repeat __________ times. Complete this stretch __________ times per day. STRETCH - Soleus, Standing   Place your hands on a wall.   Extend your right / left leg behind you, and place a folded washcloth under the arch of your foot for support. Keep the front knee somewhat bent.   Slightly point your toes inward on your back foot.   Keep your right / left heel on the floor, bend your back knee, and slightly shift your weight over the back leg, so that you feel a gentle stretch deep in your back calf.   Hold this position for __________ seconds.  Repeat __________ times. Complete this stretch __________ times per day. STRETCH - Hamstrings, Standing  Stand or sit and extend your right / left leg, placing your foot on a chair or foot stool, keeping a slight arch in your low back and your hips straight forward.   Lead with your chest and lean forward  at the waist, until you feel a gentle stretch in the back of your right / left knee or thigh. (When done correctly, this exercise requires leaning only a small distance.)   Hold this position for __________ seconds.  Repeat __________ times. Complete this stretch __________ times per day. STRENGTHENING EXERCISES - Common Peroneal Nerve Entrapment These exercises may help you when beginning to rehabilitate your injury. They may resolve your symptoms with or without further involvement from your physician, physical therapist or athletic trainer. While completing these exercises, remember:   Muscles can gain both the endurance and the strength needed for everyday activities through controlled exercises.   Complete these exercises as instructed by your physician, physical therapist or athletic trainer. Increase the resistance and repetitions only as guided.   You may experience muscle soreness or fatigue, but the pain or discomfort you are trying to eliminate should never worsen during these exercises. If this pain does get worse, stop and make sure you are following the directions exactly. If the pain is still present after adjustments, discontinue the exercise until you can discuss the trouble with your caregiver.  STRENGTH - Dorsiflexors  Secure a rubber exercise band or tubing to a fixed object (table, pole) and loop the other end around your right / left foot.   Sit on the floor facing the fixed object. The band should be slightly tense when your foot is relaxed.   Slowly draw your foot back toward you, using your ankle and toes.   Hold this position for __________ seconds. Slowly release the tension in the band and return your foot to the starting position.  Repeat __________ times. Complete this exercise __________ times per day.  STRENGTH - Ankle Eversion   Secure one end of a rubber exercise band or tubing to a fixed object (table, pole). Loop the other end around your foot, just before  your toes.   Place your fists between your knees. This will focus your strengthening at your ankle.   Drawing the band across your opposite foot, away from the pole, slowly, pull your little toe out and up. Make sure the band is positioned to resist the entire motion.   Hold this position for __________ seconds.   Return to the starting position slowly, controlling the tension in the band.  Repeat __________ times. Complete this exercise __________ times per day.  STRENGTH - Ankle Inversion   Secure one end of a rubber exercise band or tubing to a fixed object (  table, pole). Loop the other end around your foot, just before your toes.   Place your fists between your knees. This will focus your strengthening at your ankle.   Slowly, pull your big toe up and in, making sure the band is positioned to resist the entire motion.   Hold this position for __________ seconds.   Return to the starting position slowly, controlling the tension in the band.  Repeat __________ times. Complete this exercises __________ times per day.  Document Released: 11/17/2005 Document Revised: 11/06/2011 Document Reviewed: 03/01/2009 [facility] Patient Information 2012 [place], [place].

## 2012-07-30 NOTE — Progress Notes (Signed)
CC: Steven Everett is a 77 y.o. male is here for Foot Problem   Subjective: HPI:  2-3 days of gradual onset now persistent numbness and weakness in the right foot. The numbness localized to the top medial aspect of the foot extending all the way to the big toe. He reports inability to lift his right great toe, this caused him to trip a number of times but fortunately has not had any falls. Has never happened to him before. He denies any motor or sensory disturbances elsewhere in his body. He denies any low back pain nor any right knee pain nor any right ankle pain. Has been no trauma preceding the onset of this discomfort. There's been no intervention for this either. He denies any joint swelling, skin changes, pain in the lower extremity, history of surgeries to the right knee ankle or foot. He denies any history of claudication like symptoms in the lower extremities, nor exertional chest pain.   Review Of Systems Outlined In HPI  Past Medical History  Diagnosis Date  . Hemorrhoids, internal   . Diverticulosis of colon   . Barrett's esophagus   . Cirrhosis of liver     w/o mention of alcohol  . Cancer     lung, lower lobe  . Seborrheic dermatitis   . Hyperlipidemia   . Hypothyroidism   . Hypertension   . GERD (gastroesophageal reflux disease)   . Diabetes mellitus     type 2  . Rheumatic fever      Family History  Problem Relation Age of Onset  . Cancer Mother     breast  . Diabetes Mother   . Cancer Father     Lung  . Diabetes       History  Substance Use Topics  . Smoking status: Former Games developer  . Smokeless tobacco: Never Used  . Alcohol Use: No     Objective: Filed Vitals:   07/30/12 1623  BP: 121/77  Pulse: 83  Temp: 97.5 F (36.4 C)  Resp: 16    General: Alert and Oriented, No Acute Distress Extremities: No peripheral edema.  1+ peripheral pulses.  Neuro: CN II-XII grossly intact, full strength/rom of all four extremities, C5/L4/S1 DTRs 2/4 bilaterally,  mild right foot drop on gait, rapid alternating movements normal, heel-shin test normal, Rhomberg normal.  Absent temperature sensation in the left L5 dermatome, additionally vibratory and monofilament sensation are absent in this region but preserved elsewhere. Inability to raise right great toe against gravity. Mental Status: No depression, anxiety, nor agitation. MSK: Full passive range of motion in the right foot and ankle, no malleolus tenderness bilaterally in the right foot, no fibular head tenderness to palpation or percussion. Skin: Warm and dry. No suspicious skin changes on the right foot or right extremity.  Assessment & Plan: Steven Everett was seen today for foot problem.  Diagnoses and associated orders for this visit:  Foot drop, right - NCV with EMG(electromyography); Future    Discussed with patient I believe he has a common peroneal nerve impingement/neuropathy, will get nerve conduction studies to localize the site and confirms diagnosis. I've asked him to use a cane while walking and gave him some basic stretching exercises, anticipate formal physical therapy in the near future if the above diagnosis is confirmed. I've asked him to followup 2-3 days after the nerve conduction study to discuss results and plan in person.Signs and symptoms requring emergent/urgent reevaluation were discussed with the patient.  No Follow-up on file.  Requested Prescriptions    No prescriptions requested or ordered in this encounter

## 2012-08-24 DIAGNOSIS — Z23 Encounter for immunization: Secondary | ICD-10-CM | POA: Diagnosis not present

## 2012-08-28 ENCOUNTER — Other Ambulatory Visit: Payer: Self-pay | Admitting: Family Medicine

## 2012-08-30 ENCOUNTER — Other Ambulatory Visit: Payer: Self-pay | Admitting: Family Medicine

## 2012-09-26 DIAGNOSIS — R42 Dizziness and giddiness: Secondary | ICD-10-CM | POA: Diagnosis not present

## 2012-09-26 DIAGNOSIS — R7309 Other abnormal glucose: Secondary | ICD-10-CM | POA: Diagnosis not present

## 2012-09-26 DIAGNOSIS — Z79899 Other long term (current) drug therapy: Secondary | ICD-10-CM | POA: Diagnosis not present

## 2012-09-26 DIAGNOSIS — R930 Abnormal findings on diagnostic imaging of skull and head, not elsewhere classified: Secondary | ICD-10-CM | POA: Diagnosis not present

## 2012-09-26 DIAGNOSIS — H8110 Benign paroxysmal vertigo, unspecified ear: Secondary | ICD-10-CM | POA: Diagnosis not present

## 2012-09-26 DIAGNOSIS — R404 Transient alteration of awareness: Secondary | ICD-10-CM | POA: Diagnosis not present

## 2012-09-26 DIAGNOSIS — G9389 Other specified disorders of brain: Secondary | ICD-10-CM | POA: Diagnosis not present

## 2012-09-26 DIAGNOSIS — E039 Hypothyroidism, unspecified: Secondary | ICD-10-CM | POA: Diagnosis not present

## 2012-09-26 DIAGNOSIS — R112 Nausea with vomiting, unspecified: Secondary | ICD-10-CM | POA: Diagnosis not present

## 2012-09-26 DIAGNOSIS — R030 Elevated blood-pressure reading, without diagnosis of hypertension: Secondary | ICD-10-CM | POA: Diagnosis not present

## 2012-11-16 ENCOUNTER — Encounter: Payer: Self-pay | Admitting: Family Medicine

## 2012-11-16 ENCOUNTER — Ambulatory Visit (INDEPENDENT_AMBULATORY_CARE_PROVIDER_SITE_OTHER): Payer: Medicare Other | Admitting: Family Medicine

## 2012-11-16 ENCOUNTER — Ambulatory Visit (INDEPENDENT_AMBULATORY_CARE_PROVIDER_SITE_OTHER): Payer: Medicare Other

## 2012-11-16 VITALS — BP 131/79 | HR 82 | Ht 70.0 in | Wt 204.0 lb

## 2012-11-16 DIAGNOSIS — E88819 Insulin resistance, unspecified: Secondary | ICD-10-CM

## 2012-11-16 DIAGNOSIS — R7301 Impaired fasting glucose: Secondary | ICD-10-CM

## 2012-11-16 DIAGNOSIS — K7460 Unspecified cirrhosis of liver: Secondary | ICD-10-CM | POA: Diagnosis not present

## 2012-11-16 DIAGNOSIS — C3430 Malignant neoplasm of lower lobe, unspecified bronchus or lung: Secondary | ICD-10-CM

## 2012-11-16 DIAGNOSIS — E8881 Metabolic syndrome: Secondary | ICD-10-CM | POA: Diagnosis not present

## 2012-11-16 DIAGNOSIS — Z13220 Encounter for screening for lipoid disorders: Secondary | ICD-10-CM

## 2012-11-16 DIAGNOSIS — R011 Cardiac murmur, unspecified: Secondary | ICD-10-CM

## 2012-11-16 DIAGNOSIS — E0789 Other specified disorders of thyroid: Secondary | ICD-10-CM | POA: Diagnosis not present

## 2012-11-16 DIAGNOSIS — Z09 Encounter for follow-up examination after completed treatment for conditions other than malignant neoplasm: Secondary | ICD-10-CM

## 2012-11-16 DIAGNOSIS — E039 Hypothyroidism, unspecified: Secondary | ICD-10-CM | POA: Diagnosis not present

## 2012-11-16 DIAGNOSIS — Z87891 Personal history of nicotine dependence: Secondary | ICD-10-CM | POA: Diagnosis not present

## 2012-11-16 DIAGNOSIS — Z85118 Personal history of other malignant neoplasm of bronchus and lung: Secondary | ICD-10-CM

## 2012-11-16 DIAGNOSIS — J984 Other disorders of lung: Secondary | ICD-10-CM | POA: Diagnosis not present

## 2012-11-16 MED ORDER — SCOPOLAMINE 1 MG/3DAYS TD PT72
1.0000 | MEDICATED_PATCH | TRANSDERMAL | Status: DC
Start: 2012-11-16 — End: 2013-01-26

## 2012-11-16 NOTE — Progress Notes (Signed)
  Subjective:    Patient ID: Steven Everett, male    DOB: 12/08/1934, 77 y.o.   MRN: [id number]  HPI Had an epidose of Vertigo and went to [facility] ED. Getting better. Says the meclizine helps but wants to know if a patch is available.    IFG - he continues to exercise 3 days per week which is a little less than he used to since his wife has been ill. He continues to eat healthy. He has gained a couple pounds since I last saw him.  Hypothyroidism-no skin or hair changes. No weight changes. He is completely asymptomatic and taking medication regularly.  Review of Systems     Objective:   Physical Exam  Constitutional: He is oriented to person, place, and time. He appears well-developed and well-nourished.  HENT:  Head: Normocephalic and atraumatic.  Cardiovascular: Normal rate and regular rhythm.   Murmur heard.      High ptiched S1 ;murmur best heard at the right sternal border.   Pulmonary/Chest: Effort normal and breath sounds normal.  Neurological: He is alert and oriented to person, place, and time.  Skin: Skin is warm and dry.  Psychiatric: He has a normal mood and affect. His behavior is normal.          Assessment & Plan:  IFG- will take off his problems list since she is doing better.   Lab Results  Component Value Date   HGBA1C 5.6 05/18/2012   Vertigo - improving.  Head and to go ahead and give him a prescription first troponin patch. Hopefully he will continue to improve. He says he has been doing home exercises that they gave him from the emergency department. If symptoms persist please let me know.  Hypothyroidism-he's doing well. He is asymptomatic. Recheck TSH today.  History of lung cancer-he is due for repeat chest x-ray today.  New heart murmur on exam today. Because high-pitched would like to get an echocardiogram. He prefers to reschedule our heart care in [place] on a Thursday when his wife is getting chemotherapy.

## 2012-11-17 LAB — LIPID PANEL
Total CHOL/HDL Ratio: 2.5 Ratio
VLDL: 12 mg/dL (ref 0–40)

## 2012-11-17 LAB — COMPLETE METABOLIC PANEL WITH GFR
Albumin: 4.5 g/dL (ref 3.5–5.2)
Alkaline Phosphatase: 81 U/L (ref 39–117)
GFR, Est Non African American: 64 mL/min
Glucose, Bld: 91 mg/dL (ref 70–99)
Potassium: 5.1 mEq/L (ref 3.5–5.3)
Sodium: 138 mEq/L (ref 135–145)
Total Protein: 7.6 g/dL (ref 6.0–8.3)

## 2012-11-18 ENCOUNTER — Telehealth: Payer: Self-pay | Admitting: *Deleted

## 2012-11-18 NOTE — Telephone Encounter (Signed)
Spoke with patient's wife and informed her labs were normal. No further questions asked.

## 2012-12-16 ENCOUNTER — Other Ambulatory Visit (HOSPITAL_COMMUNITY): Payer: Self-pay | Admitting: Family Medicine

## 2012-12-16 ENCOUNTER — Ambulatory Visit (HOSPITAL_COMMUNITY): Payer: Medicare Other | Attending: Cardiology | Admitting: Radiology

## 2012-12-16 DIAGNOSIS — R011 Cardiac murmur, unspecified: Secondary | ICD-10-CM | POA: Diagnosis not present

## 2012-12-16 DIAGNOSIS — I359 Nonrheumatic aortic valve disorder, unspecified: Secondary | ICD-10-CM | POA: Insufficient documentation

## 2012-12-16 NOTE — Progress Notes (Signed)
Echocardiogram performed.  

## 2012-12-21 ENCOUNTER — Other Ambulatory Visit: Payer: Self-pay | Admitting: Family Medicine

## 2012-12-21 DIAGNOSIS — R011 Cardiac murmur, unspecified: Secondary | ICD-10-CM

## 2012-12-21 DIAGNOSIS — R6889 Other general symptoms and signs: Secondary | ICD-10-CM

## 2013-01-26 ENCOUNTER — Ambulatory Visit (INDEPENDENT_AMBULATORY_CARE_PROVIDER_SITE_OTHER): Payer: Medicare Other | Admitting: Cardiology

## 2013-01-26 ENCOUNTER — Encounter: Payer: Self-pay | Admitting: Cardiology

## 2013-01-26 ENCOUNTER — Encounter: Payer: Self-pay | Admitting: *Deleted

## 2013-01-26 VITALS — BP 140/80 | HR 86 | Ht 70.0 in | Wt 205.0 lb

## 2013-01-26 DIAGNOSIS — K7460 Unspecified cirrhosis of liver: Secondary | ICD-10-CM

## 2013-01-26 DIAGNOSIS — I359 Nonrheumatic aortic valve disorder, unspecified: Secondary | ICD-10-CM

## 2013-01-26 DIAGNOSIS — I420 Dilated cardiomyopathy: Secondary | ICD-10-CM | POA: Insufficient documentation

## 2013-01-26 DIAGNOSIS — I428 Other cardiomyopathies: Secondary | ICD-10-CM | POA: Diagnosis not present

## 2013-01-26 DIAGNOSIS — I350 Nonrheumatic aortic (valve) stenosis: Secondary | ICD-10-CM

## 2013-01-26 MED ORDER — ASPIRIN EC 81 MG PO TBEC: 81.0000 mg | DELAYED_RELEASE_TABLET | Freq: Every day | ORAL | Status: DC

## 2013-01-26 MED ORDER — CARVEDILOL 3.125 MG PO TABS: 3.1250 mg | ORAL_TABLET | Freq: Two times a day (BID) | ORAL | Status: DC

## 2013-01-26 NOTE — Progress Notes (Signed)
HPI: 78 year old male for evaluation of murmur. Echocardiogram in January of 2014 showed an ejection fraction of 35%. There was grade 1 diastolic dysfunction. Steven Everett there was mild aortic stenosis by gradient but moderate based on visual appearance. There was trace aortic insufficiency. There was mild left atrial enlargement. RV function mildly reduced. Patient denies dyspnea on exertion, orthopnea, PND, pedal edema, palpitations, syncope or exertional chest pain. Recently noted to have a murmur and cardiology asked to evaluate.  Current Outpatient Prescriptions  Medication Sig Dispense Refill  . fish oil-omega-3 fatty acids 1000 MG capsule Take 2 g by mouth daily. Take two tablets daily      . levothyroxine (SYNTHROID, LEVOTHROID) 112 MCG tablet TAKE 1 TABLET ONCE DAILY  30 tablet  3   Current Facility-Administered Medications  Medication Dose Route Frequency Provider Last Rate Last Dose  . 0.9 %  sodium chloride infusion  500 mL Intravenous Continuous Rachael Fee, MD        Allergies  Allergen Reactions  . Atorvastatin     REACTION: joint aches    Past Medical History  Diagnosis Date  . Hemorrhoids, internal   . Diverticulosis of colon   . Barrett's esophagus   . Cirrhosis of liver     w/o mention of alcohol  . Cancer     lung, lower lobe  . Seborrheic dermatitis   . Hyperlipidemia   . Hypothyroidism   . Hypertension   . GERD (gastroesophageal reflux disease)   . Diabetes mellitus     type 2  . Rheumatic fever     Past Surgical History  Procedure Laterality Date  . Right middle and lower lobectomy  1998    Dr Edwyna Shell- for lung cancer by Dr Gershon Crane  . Neck surgery  6-00    Dr Jordan Likes  . Appendectomy    . Back cervical    . Right hip replacement      Dr Charlann Boxer  . Foot surgery    . Cardiac catheterization    . Bilat cat surgery  8-11    Dr Pecola Leisure  . Colonoscopy      History   Social History  . Marital Status: Married    Spouse Name: N/A    Number of Children: 1   . Years of Education: N/A   Occupational History  .     Social History Main Topics  . Smoking status: Former Games developer  . Smokeless tobacco: Never Used  . Alcohol Use: No  . Drug Use: No  . Sexually Active: Not on file   Other Topics Concern  . Not on file   Social History Narrative  . No narrative on file    Family History  Problem Relation Age of Onset  . Cancer Mother     breast  . Diabetes Mother   . Cancer Father     Lung  . Diabetes      ROS: no fevers or chills, productive cough, hemoptysis, dysphasia, odynophagia, melena, hematochezia, dysuria, hematuria, rash, seizure activity, orthopnea, PND, pedal edema, claudication. Remaining systems are negative.  Physical Exam:   Blood pressure 140/80, pulse 86, height 5\' 10"  (1.778 m), weight 205 lb (92.987 kg).  General:  Well developed/well nourished in NAD Skin warm/dry Patient not depressed No peripheral clubbing Back-normal HEENT-normal/normal eyelids Neck supple/normal carotid upstroke bilaterally; no bruits; no JVD; no thyromegaly chest - CTA/ normal expansion CV - RRR/normal S1 and S2; no rubs or gallops;  PMI nondisplaced, 3/6 systolic murmur left sternal  border. S2 is not diminished. Abdomen -NT/ND, no HSM, no mass, + bowel sounds, no bruit 2+ femoral pulses, no bruits Ext-no edema, chords, 2+ DP, bilateral deformity of ankles. Neuro-grossly nonfocal  ECG sinus rhythm at a rate of 86. First degree AV block.

## 2013-01-26 NOTE — Assessment & Plan Note (Signed)
Ejection fraction 35% but technically difficult. Plan transesophageal echocardiogram to more fully assess. Add enteric-coated aspirin 81 mg daily and carvedilol 3.125 mg daily. Patient does have a first-degree AV block on his electrocardiogram. Follow closely for worsening conduction abnormality. If his LV function is truly 35% I will add an ACE inhibitor at next office visit. He would also need ischemia evaluation to exclude coronary disease as a cause with either cardiac catheterization or functional study. Recent TSH normal. Given history of cirrhosis I will check a CBC for hemoglobin and platelet count of the normal in June of 2012. I will also check a ferritin level.

## 2013-01-26 NOTE — Patient Instructions (Addendum)
Your physician recommends that you schedule a follow-up appointment in: 4-6 WEEKS WITH DR Jens Som IN [place]  START ASPIRIN 81 MG ONCE DAILY  Your physician recommends that you HAVE LAB WORK TODAY  Your physician has requested that you have a TEE. During a TEE, sound waves are used to create images of your heart. It provides your doctor with information about the size and shape of your heart and how well your heart's chambers and valves are working. In this test, a transducer is attached to the end of a flexible tube that's guided down your throat and into your esophagus (the tube leading from you mouth to your stomach) to get a more detailed image of your heart. You are not awake for the procedure. Please see the instruction sheet given to you today. For further information please visit [email or web address]  START CARVEDILOL 3.125 MG ONE TABLET TWICE DAILY

## 2013-01-26 NOTE — Assessment & Plan Note (Signed)
Previous EGD showed no varices in 2012. Recent liver functions normal. Check hemoglobin and platelet count.

## 2013-01-26 NOTE — Assessment & Plan Note (Signed)
Patient with probable moderate aortic stenosis on examination. Previous transthoracic echocardiogram technically difficult. Proceed with transesophageal echocardiogram to better assess. Note he is not having symptoms.

## 2013-01-27 ENCOUNTER — Other Ambulatory Visit: Payer: Self-pay | Admitting: Cardiology

## 2013-01-27 DIAGNOSIS — I359 Nonrheumatic aortic valve disorder, unspecified: Secondary | ICD-10-CM

## 2013-01-27 LAB — CBC
MCV: 83 fL (ref 78.0–100.0)
Platelets: 178 10*3/uL (ref 150–400)
RBC: 5.54 MIL/uL (ref 4.22–5.81)
WBC: 5.8 10*3/uL (ref 4.0–10.5)

## 2013-01-27 LAB — FERRITIN: Ferritin: 88 ng/mL (ref 22–322)

## 2013-02-14 ENCOUNTER — Encounter (HOSPITAL_COMMUNITY): Payer: Self-pay | Admitting: *Deleted

## 2013-02-14 ENCOUNTER — Ambulatory Visit (HOSPITAL_COMMUNITY)
Admission: RE | Admit: 2013-02-14 | Discharge: 2013-02-14 | Disposition: A | Discharge status: Home / Self Care | Payer: Medicare Other | Source: Ambulatory Visit | Attending: Cardiology | Admitting: Cardiology

## 2013-02-14 ENCOUNTER — Encounter (HOSPITAL_COMMUNITY)
Admission: RE | Disposition: A | Discharge status: Home / Self Care | Payer: Self-pay | Source: Ambulatory Visit | Attending: Cardiology

## 2013-02-14 DIAGNOSIS — I359 Nonrheumatic aortic valve disorder, unspecified: Secondary | ICD-10-CM | POA: Diagnosis not present

## 2013-02-14 DIAGNOSIS — I428 Other cardiomyopathies: Secondary | ICD-10-CM | POA: Insufficient documentation

## 2013-02-14 HISTORY — PX: TEE WITHOUT CARDIOVERSION: SHX5443

## 2013-02-14 SURGERY — ECHOCARDIOGRAM, TRANSESOPHAGEAL
Anesthesia: Moderate Sedation

## 2013-02-14 MED ORDER — BUTAMBEN-TETRACAINE-BENZOCAINE 2-2-14 % EX AERO
INHALATION_SPRAY | CUTANEOUS | Status: DC | PRN
  Administered 2013-02-14: 2 via TOPICAL

## 2013-02-14 MED ORDER — FENTANYL CITRATE 0.05 MG/ML IJ SOLN
INTRAMUSCULAR | Status: DC | PRN
  Administered 2013-02-14 (×2): 25 ug via INTRAVENOUS

## 2013-02-14 MED ORDER — SODIUM CHLORIDE 0.9 % IV SOLN
INTRAVENOUS | Status: DC
  Administered 2013-02-14: 500 mL via INTRAVENOUS

## 2013-02-14 MED ORDER — FENTANYL CITRATE 0.05 MG/ML IJ SOLN
INTRAMUSCULAR | Status: AC
  Filled 2013-02-14: qty 4

## 2013-02-14 MED ORDER — MIDAZOLAM HCL 5 MG/ML IJ SOLN
INTRAMUSCULAR | Status: AC
  Filled 2013-02-14: qty 2

## 2013-02-14 MED ORDER — MIDAZOLAM HCL 5 MG/5ML IJ SOLN
INTRAMUSCULAR | Status: DC | PRN
  Administered 2013-02-14 (×2): 2 mg via INTRAVENOUS

## 2013-02-14 NOTE — CV Procedure (Signed)
See full TEE report in [employer].  Steven Everett  

## 2013-02-14 NOTE — H&P (Signed)
LEM PEARY  01/26/2013 2:00 PM   Office Visit  MRN:  [id number]   Description: 78 year old male  Provider: Lewayne Bunting, MD  Department: [facility]        Referring Provider    Agapito Games, MD      Diagnoses    AVD (aortic valve disease)    -  Primary    424.1    Congestive dilated cardiomyopathy        425.4    Aortic stenosis        424.1    Cirrhosis of liver without mention of alcohol        571.5      Reason for Visit    Heart Murmur    New patient evaluation         Progress Notes    Lewayne Bunting, MD at 01/26/2013  3:06 PM    Status: Signed                    HPI: 78 year old male for evaluation of murmur. Echocardiogram in January of 2014 showed an ejection fraction of 35%. There was grade 1 diastolic dysfunction. Will there was mild aortic stenosis by gradient but moderate based on visual appearance. There was trace aortic insufficiency. There was mild left atrial enlargement. RV function mildly reduced. Patient denies dyspnea on exertion, orthopnea, PND, pedal edema, palpitations, syncope or exertional chest pain. Recently noted to have a murmur and cardiology asked to evaluate.    Current Outpatient Prescriptions   Medication  Sig  Dispense  Refill   .  fish oil-omega-3 fatty acids 1000 MG capsule  Take 2 g by mouth daily. Take two tablets daily         .  levothyroxine (SYNTHROID, LEVOTHROID) 112 MCG tablet  TAKE 1 TABLET ONCE DAILY   30 tablet   3       Current Facility-Administered Medications   Medication  Dose  Route  Frequency  Provider  Last Rate  Last Dose   .  0.9 %  sodium chloride infusion   500 mL  Intravenous  Continuous  Rachael Fee, MD               Allergies   Allergen  Reactions   .  Atorvastatin         REACTION: joint aches        Past Medical History   Diagnosis  Date   .  Hemorrhoids, internal     .  Diverticulosis of colon     .  Barrett's esophagus     .  Cirrhosis of liver         w/o  mention of alcohol   .  Cancer         lung, lower lobe   .  Seborrheic dermatitis     .  Hyperlipidemia     .  Hypothyroidism     .  Hypertension     .  GERD (gastroesophageal reflux disease)     .  Diabetes mellitus         type 2   .  Rheumatic fever           Past Surgical History   Procedure  Laterality  Date   .  Right middle and lower lobectomy    1998       Dr Edwyna Shell- for lung cancer by Dr Gershon Crane   .  Neck surgery    6-00       Dr Jordan Likes   .  Appendectomy       .  Back cervical       .  Right hip replacement           Dr Charlann Boxer   .  Foot surgery       .  Cardiac catheterization       .  Bilat cat surgery    8-11       Dr Pecola Leisure   .  Colonoscopy             History       Social History   .  Marital Status:  Married       Spouse Name:  N/A       Number of Children:  1   .  Years of Education:  N/A       Occupational History   .           Social History Main Topics   .  Smoking status:  Former Games developer   .  Smokeless tobacco:  Never Used   .  Alcohol Use:  No   .  Drug Use:  No   .  Sexually Active:  Not on file       Other Topics  Concern   .  Not on file       Social History Narrative   .  No narrative on file         Family History   Problem  Relation  Age of Onset   .  Cancer  Mother         breast   .  Diabetes  Mother     .  Cancer  Father         Lung   .  Diabetes            ROS: no fevers or chills, productive cough, hemoptysis, dysphasia, odynophagia, melena, hematochezia, dysuria, hematuria, rash, seizure activity, orthopnea, PND, pedal edema, claudication. Remaining systems are negative.   Physical Exam:    Blood pressure 140/80, pulse 86, height 5\' 10"  (1.778 m), weight 205 lb (92.987 kg).   General:  Well developed/well nourished in NAD Skin warm/dry Patient not depressed No peripheral clubbing Back-normal HEENT-normal/normal eyelids Neck supple/normal carotid upstroke bilaterally; no bruits; no JVD; no  thyromegaly chest - CTA/ normal expansion CV - RRR/normal S1 and S2; no rubs or gallops;  PMI nondisplaced, 3/6 systolic murmur left sternal border. S2 is not diminished. Abdomen -NT/ND, no HSM, no mass, + bowel sounds, no bruit 2+ femoral pulses, no bruits Ext-no edema, chords, 2+ DP, bilateral deformity of ankles. Neuro-grossly nonfocal   ECG sinus rhythm at a rate of 86. First degree AV block.              Aortic stenosis - Lewayne Bunting, MD at 01/26/2013  2:29 PM    Status: Written Related Problem: Aortic stenosis           Patient with probable moderate aortic stenosis on examination. Previous transthoracic echocardiogram technically difficult. Proceed with transesophageal echocardiogram to better assess. Note he is not having symptoms.         Congestive dilated cardiomyopathy - Lewayne Bunting, MD at 01/26/2013  2:31 PM    Status: Written Related Problem: Congestive dilated cardiomyopathy           Ejection fraction 35% but technically difficult.  Plan transesophageal echocardiogram to more fully assess. Add enteric-coated aspirin 81 mg daily and carvedilol 3.125 mg daily. Patient does have a first-degree AV block on his electrocardiogram. Follow closely for worsening conduction abnormality. If his LV function is truly 35% I will add an ACE inhibitor at next office visit. He would also need ischemia evaluation to exclude coronary disease as a cause with either cardiac catheterization or functional study. Recent TSH normal. Given history of cirrhosis I will check a CBC for hemoglobin and platelet count of the normal in June of 2012. I will also check a ferritin level.         CIRRHOSIS OF LIVER WITHOUT MENTION OF ALCOHOL - Lewayne Bunting, MD at 01/26/2013  2:31 PM    Status: Written Related Problem: CIRRHOSIS OF LIVER WITHOUT MENTION OF ALCOHOL           Previous EGD showed no varices in 2012. Recent liver functions normal. Check hemoglobin and platelet count.    For TEE;  no changes Olga Millers

## 2013-02-14 NOTE — Interval H&P Note (Signed)
History and Physical Interval Note:  02/14/2013 10:30 AM  Steven Everett  has presented today for surgery, with the diagnosis of as  The various methods of treatment have been discussed with the patient and family. After consideration of risks, benefits and other options for treatment, the patient has consented to  Procedure(s): TRANSESOPHAGEAL ECHOCARDIOGRAM (TEE) (N/A) as a surgical intervention .  The patient's history has been reviewed, patient examined, no change in status, stable for surgery.  I have reviewed the patient's chart and labs.  Questions were answered to the patient's satisfaction.     Olga Millers

## 2013-02-14 NOTE — Progress Notes (Signed)
  Echocardiogram Echocardiogram Transesophageal has been performed.  Steven Everett 02/14/2013, 11:06 AM

## 2013-02-15 ENCOUNTER — Encounter (HOSPITAL_COMMUNITY): Payer: Self-pay | Admitting: Cardiology

## 2013-03-02 ENCOUNTER — Encounter: Payer: Self-pay | Admitting: Cardiology

## 2013-03-02 ENCOUNTER — Ambulatory Visit (INDEPENDENT_AMBULATORY_CARE_PROVIDER_SITE_OTHER): Payer: Medicare Other | Admitting: Cardiology

## 2013-03-02 ENCOUNTER — Encounter: Payer: Self-pay | Admitting: *Deleted

## 2013-03-02 VITALS — BP 122/78 | HR 94 | Wt 207.0 lb

## 2013-03-02 DIAGNOSIS — I428 Other cardiomyopathies: Secondary | ICD-10-CM

## 2013-03-02 DIAGNOSIS — I350 Nonrheumatic aortic (valve) stenosis: Secondary | ICD-10-CM

## 2013-03-02 DIAGNOSIS — I359 Nonrheumatic aortic valve disorder, unspecified: Secondary | ICD-10-CM

## 2013-03-02 DIAGNOSIS — I420 Dilated cardiomyopathy: Secondary | ICD-10-CM

## 2013-03-02 MED ORDER — LISINOPRIL 2.5 MG PO TABS: 2.5000 mg | ORAL_TABLET | Freq: Every day | ORAL | Status: DC

## 2013-03-02 NOTE — Assessment & Plan Note (Signed)
Mild to moderate when evaluating by transthoracic and transesophageal echocardiogram.

## 2013-03-02 NOTE — Assessment & Plan Note (Signed)
Etiology of patient's cardiomyopathy unclear. Plan cardiac catheterization to rule out coronary disease. He will need a right heart catheterization as well to further assess his aortic stenosis. The risks and benefits of cardiac catheterization were discussed and he agrees to proceed. Continue aspirin and beta blocker. Add lisinopril 2.5 mg daily. Check potassium and renal function in one week.

## 2013-03-02 NOTE — Patient Instructions (Signed)
Your physician recommends that you schedule a follow-up appointment in: 6 WEEKS WITH DR CRENSHAW IN   START LISINOPRIL 2.5 MG ONCE DAILY  Your physician recommends that you return for lab work in: ONE WEEK IN [facility]  Your physician has requested that you have a cardiac catheterization. Cardiac catheterization is used to diagnose and/or treat various heart conditions. Doctors may recommend this procedure for a number of different reasons. The most common reason is to evaluate chest pain. Chest pain can be a symptom of coronary artery disease (CAD), and cardiac catheterization can show whether plaque is narrowing or blocking your heart's arteries. This procedure is also used to evaluate the valves, as well as measure the blood flow and oxygen levels in different parts of your heart. For further information please visit [email or web address] Please follow instruction sheet, as given.

## 2013-03-02 NOTE — Progress Notes (Signed)
HPI: 78 year old male I initially saw in Feb 2014 for evaluation of murmur. Echocardiogram in January of 2014 showed an ejection fraction of 35%. There was grade 1 diastolic dysfunction. Will there was mild aortic stenosis by gradient but moderate based on visual appearance. There was trace aortic insufficiency. There was mild left atrial enlargement. RV function mildly reduced. Transesophageal echocardiogram in March of 2014 showed an ejection fraction of 30-35% and akinesis of the inferior and proximal septal myocardium. There was moderate aortic stenosis (AVA by planimetry 1.2-1.4 cm2) and mild mitral regurgitation. There was mild left atrial enlargement. Since I last saw him,  the patient denies any dyspnea on exertion, orthopnea, PND, pedal edema, palpitations, syncope or chest pain.   Current Outpatient Prescriptions  Medication Sig Dispense Refill  . aspirin EC 81 MG tablet Take 1 tablet (81 mg total) by mouth daily.  90 tablet  3  . carvedilol (COREG) 3.125 MG tablet Take 1 tablet (3.125 mg total) by mouth 2 (two) times daily.  60 tablet  12  . fish oil-omega-3 fatty acids 1000 MG capsule Take two tablets daily      . levothyroxine (SYNTHROID, LEVOTHROID) 112 MCG tablet TAKE 1 TABLET ONCE DAILY  30 tablet  3   No current facility-administered medications for this visit.     Past Medical History  Diagnosis Date  . Hemorrhoids, internal   . Diverticulosis of colon   . Barrett's esophagus   . Cirrhosis of liver     w/o mention of alcohol  . Cancer     lung, lower lobe  . Seborrheic dermatitis   . Hyperlipidemia   . Hypothyroidism   . Hypertension   . GERD (gastroesophageal reflux disease)   . Diabetes mellitus     type 2  . Rheumatic fever     Past Surgical History  Procedure Laterality Date  . Right middle and lower lobectomy  1998    Dr Edwyna Shell- for lung cancer by Dr Gershon Crane  . Neck surgery  6-00    Dr Jordan Likes  . Appendectomy    . Back cervical    . Right hip  replacement      Dr Charlann Boxer  . Foot surgery    . Cardiac catheterization    . Bilat cat surgery  8-11    Dr Pecola Leisure  . Colonoscopy    . Tee without cardioversion N/A 02/14/2013    Procedure: TRANSESOPHAGEAL ECHOCARDIOGRAM (TEE);  Surgeon: Lewayne Bunting, MD;  Location: [facility] ENDOSCOPY;  Service: Cardiovascular;  Laterality: N/A;    History   Social History  . Marital Status: Married    Spouse Name: N/A    Number of Children: 1  . Years of Education: N/A   Occupational History  .     Social History Main Topics  . Smoking status: Former Smoker    Quit date: 02/14/1989  . Smokeless tobacco: Never Used  . Alcohol Use: No  . Drug Use: No  . Sexually Active: Not on file   Other Topics Concern  . Not on file   Social History Narrative  . No narrative on file    ROS: no fevers or chills, productive cough, hemoptysis, dysphasia, odynophagia, melena, hematochezia, dysuria, hematuria, rash, seizure activity, orthopnea, PND, pedal edema, claudication. Remaining systems are negative.  Physical Exam: Well-developed well-nourished in no acute distress.  Skin is warm and dry.  HEENT is normal.  Neck is supple.  Chest is clear to auscultation with normal expansion.  Cardiovascular exam  is regular rate and rhythm. 3/6 systolic murmur left sternal border. Abdominal exam nontender or distended. No masses palpated. Extremities show no edema. neuro grossly intact

## 2013-03-03 ENCOUNTER — Other Ambulatory Visit: Payer: Self-pay | Admitting: Cardiology

## 2013-03-03 DIAGNOSIS — I359 Nonrheumatic aortic valve disorder, unspecified: Secondary | ICD-10-CM

## 2013-03-07 DIAGNOSIS — I359 Nonrheumatic aortic valve disorder, unspecified: Secondary | ICD-10-CM | POA: Diagnosis not present

## 2013-03-07 LAB — BASIC METABOLIC PANEL WITH GFR
Calcium: 10.1 mg/dL (ref 8.4–10.5)
Creat: 1.1 mg/dL (ref 0.50–1.35)
GFR, Est African American: 76 mL/min
Glucose, Bld: 98 mg/dL (ref 70–99)
Sodium: 140 mEq/L (ref 135–145)

## 2013-03-07 LAB — CBC
MCH: 28.3 pg (ref 26.0–34.0)
MCV: 84.7 fL (ref 78.0–100.0)
Platelets: 165 10*3/uL (ref 150–400)
RBC: 5.34 MIL/uL (ref 4.22–5.81)
RDW: 14.3 % (ref 11.5–15.5)
WBC: 6.1 10*3/uL (ref 4.0–10.5)

## 2013-03-07 LAB — PROTIME-INR: Prothrombin Time: 14 seconds (ref 11.6–15.2)

## 2013-03-08 ENCOUNTER — Encounter (HOSPITAL_BASED_OUTPATIENT_CLINIC_OR_DEPARTMENT_OTHER)
Admission: RE | Disposition: A | Discharge status: Home / Self Care | Payer: Self-pay | Source: Ambulatory Visit | Attending: Cardiovascular Disease

## 2013-03-08 ENCOUNTER — Inpatient Hospital Stay (HOSPITAL_BASED_OUTPATIENT_CLINIC_OR_DEPARTMENT_OTHER)
Admission: RE | Admit: 2013-03-08 | Discharge: 2013-03-08 | Disposition: A | Discharge status: Home / Self Care | Payer: Medicare Other | Source: Ambulatory Visit | Attending: Cardiovascular Disease | Admitting: Cardiovascular Disease

## 2013-03-08 DIAGNOSIS — I2510 Atherosclerotic heart disease of native coronary artery without angina pectoris: Secondary | ICD-10-CM | POA: Insufficient documentation

## 2013-03-08 DIAGNOSIS — I10 Essential (primary) hypertension: Secondary | ICD-10-CM | POA: Diagnosis not present

## 2013-03-08 DIAGNOSIS — I509 Heart failure, unspecified: Secondary | ICD-10-CM | POA: Insufficient documentation

## 2013-03-08 DIAGNOSIS — I359 Nonrheumatic aortic valve disorder, unspecified: Secondary | ICD-10-CM | POA: Insufficient documentation

## 2013-03-08 DIAGNOSIS — E785 Hyperlipidemia, unspecified: Secondary | ICD-10-CM | POA: Insufficient documentation

## 2013-03-08 LAB — POCT I-STAT 3, ART BLOOD GAS (G3+): Acid-base deficit: 2 mmol/L (ref 0.0–2.0)

## 2013-03-08 LAB — POCT I-STAT 3, VENOUS BLOOD GAS (G3P V)
Acid-base deficit: 2 mmol/L (ref 0.0–2.0)
pCO2, Ven: 50.7 mmHg — ABNORMAL HIGH (ref 45.0–50.0)
pO2, Ven: 36 mmHg (ref 30.0–45.0)

## 2013-03-08 SURGERY — JV LEFT AND RIGHT HEART CATHETERIZATION WITH CORONARY ANGIOGRAM
Anesthesia: Moderate Sedation

## 2013-03-08 MED ORDER — SODIUM CHLORIDE 0.9 % IV SOLN: 250.0000 mL | INTRAVENOUS | Status: DC | PRN

## 2013-03-08 MED ORDER — SODIUM CHLORIDE 0.9 % IV SOLN: 1.0000 mL/kg/h | INTRAVENOUS | Status: DC

## 2013-03-08 MED ORDER — SODIUM CHLORIDE 0.9 % IJ SOLN: 3.0000 mL | INTRAMUSCULAR | Status: DC | PRN

## 2013-03-08 MED ORDER — SODIUM CHLORIDE 0.9 % IJ SOLN: 3.0000 mL | Freq: Two times a day (BID) | INTRAMUSCULAR | Status: DC

## 2013-03-08 MED ORDER — DIAZEPAM 2 MG PO TABS: 2.0000 mg | ORAL_TABLET | ORAL | Status: DC

## 2013-03-08 MED ORDER — DIAZEPAM 5 MG PO TABS
5.0000 mg | ORAL_TABLET | ORAL | Status: AC
  Administered 2013-03-08: 5 mg via ORAL

## 2013-03-08 MED ORDER — SODIUM CHLORIDE 0.9 % IV SOLN: INTRAVENOUS | Status: DC

## 2013-03-08 MED ORDER — ACETAMINOPHEN 325 MG PO TABS: 650.0000 mg | ORAL_TABLET | ORAL | Status: DC | PRN

## 2013-03-08 MED ORDER — ONDANSETRON HCL 4 MG/2ML IJ SOLN: 4.0000 mg | Freq: Four times a day (QID) | INTRAMUSCULAR | Status: DC | PRN

## 2013-03-08 MED ORDER — ASPIRIN 81 MG PO CHEW
324.0000 mg | CHEWABLE_TABLET | ORAL | Status: AC
  Administered 2013-03-08: 243 mg via ORAL

## 2013-03-08 NOTE — OR Nursing (Signed)
Tegaderm dressing applied, site level 0, bedrest begins at 0940 

## 2013-03-08 NOTE — Interval H&P Note (Signed)
History and Physical Interval Note:  03/08/2013 9:21 AM  Steven Everett  has presented today for surgery, with the diagnosis of R/O AS  The various methods of treatment have been discussed with the patient and family. After consideration of risks, benefits and other options for treatment, the patient has consented to  Procedure(s): JV LEFT AND RIGHT HEART CATHETERIZATION WITH CORONARY ANGIOGRAM (N/A) as a surgical intervention .  The patient's history has been reviewed, patient examined, no change in status, stable for surgery.  I have reviewed the patient's chart and labs.  Questions were answered to the patient's satisfaction.     Elyn Aquas.

## 2013-03-08 NOTE — Progress Notes (Signed)
Dr Katherina Right in to talk to pt and family about the results of test and plan of care.

## 2013-03-08 NOTE — H&P (View-Only) (Signed)
 HPI: 77-year-old male I initially saw in Feb 2014 for evaluation of murmur. Echocardiogram in January of 2014 showed an ejection fraction of 35%. There was grade 1 diastolic dysfunction. Will there was mild aortic stenosis by gradient but moderate based on visual appearance. There was trace aortic insufficiency. There was mild left atrial enlargement. RV function mildly reduced. Transesophageal echocardiogram in March of 2014 showed an ejection fraction of 30-35% and akinesis of the inferior and proximal septal myocardium. There was moderate aortic stenosis (AVA by planimetry 1.2-1.4 cm2) and mild mitral regurgitation. There was mild left atrial enlargement. Since I last saw him,  the patient denies any dyspnea on exertion, orthopnea, PND, pedal edema, palpitations, syncope or chest pain.   Current Outpatient Prescriptions  Medication Sig Dispense Refill  . aspirin EC 81 MG tablet Take 1 tablet (81 mg total) by mouth daily.  90 tablet  3  . carvedilol (COREG) 3.125 MG tablet Take 1 tablet (3.125 mg total) by mouth 2 (two) times daily.  60 tablet  12  . fish oil-omega-3 fatty acids 1000 MG capsule Take two tablets daily      . levothyroxine (SYNTHROID, LEVOTHROID) 112 MCG tablet TAKE 1 TABLET ONCE DAILY  30 tablet  3   No current facility-administered medications for this visit.     Past Medical History  Diagnosis Date  . Hemorrhoids, internal   . Diverticulosis of colon   . Barrett's esophagus   . Cirrhosis of liver     w/o mention of alcohol  . Cancer     lung, lower lobe  . Seborrheic dermatitis   . Hyperlipidemia   . Hypothyroidism   . Hypertension   . GERD (gastroesophageal reflux disease)   . Diabetes mellitus     type 2  . Rheumatic fever     Past Surgical History  Procedure Laterality Date  . Right middle and lower lobectomy  1998    Dr Burney- for lung cancer by Dr McBurney  . Neck surgery  6-00    Dr Pool  . Appendectomy    . Back cervical    . Right hip  replacement      Dr Olin  . Foot surgery    . Cardiac catheterization    . Bilat cat surgery  8-11    Dr Reese  . Colonoscopy    . Tee without cardioversion N/A 02/14/2013    Procedure: TRANSESOPHAGEAL ECHOCARDIOGRAM (TEE);  Surgeon: Brian S Crenshaw, MD;  Location: [facility] ENDOSCOPY;  Service: Cardiovascular;  Laterality: N/A;    History   Social History  . Marital Status: Married    Spouse Name: N/A    Number of Children: 1  . Years of Education: N/A   Occupational History  .     Social History Main Topics  . Smoking status: Former Smoker    Quit date: 02/14/1989  . Smokeless tobacco: Never Used  . Alcohol Use: No  . Drug Use: No  . Sexually Active: Not on file   Other Topics Concern  . Not on file   Social History Narrative  . No narrative on file    ROS: no fevers or chills, productive cough, hemoptysis, dysphasia, odynophagia, melena, hematochezia, dysuria, hematuria, rash, seizure activity, orthopnea, PND, pedal edema, claudication. Remaining systems are negative.  Physical Exam: Well-developed well-nourished in no acute distress.  Skin is warm and dry.  HEENT is normal.  Neck is supple.  Chest is clear to auscultation with normal expansion.  Cardiovascular exam   is regular rate and rhythm. 3/6 systolic murmur left sternal border. Abdominal exam nontender or distended. No masses palpated. Extremities show no edema. neuro grossly intact       

## 2013-03-08 NOTE — CV Procedure (Signed)
    Cardiac Cath Note  Steven Everett [id number] 1935/02/21  Procedure: right and left Heart Cardiac Catheterization Note Indications: aortic stenosis, CHF  Procedure Details Consent: Obtained Time Out: Verified patient identification, verified procedure, site/side was marked, verified correct patient position, special equipment/implants available, Radiology Safety Procedures followed,  medications/allergies/relevent history reviewed, required imaging and test results available.  Performed   Medications: Fentanyl: 50 mcg IV Versed: 2 mg IV  The right femoral artery was easily canulated using a modified Seldinger technique.  Hemodynamics:   RA: 8/8/7 RV: 41/4/10 PCWP: 13/12/11 PA:  40/12/25  Cardiac Output   Thermodilution: 4.0     Index of 1.9  Fick : 5.0   Index of 2.3  Arterial Sat: 88% PA Sat: 62%.  LV pressure: 124/12/15 Aortic pressure: 111/53  Angiography   Left Main: ostial 30-40%  Left anterior Descending: heavily calcified, proximal ulceration 30%, mid vessel is heavily calcified,  Sequential 70% and then 80%, eccentric stenosis.  Left Circumflex: mild irregularities  Right Coronary Artery: large, dominant, heavily calcified. Proximal 60-70%, mid 70% eccentric stenosis  LV Gram: moderate LV dysfunction, global hypokinesis with moderate  inferior hypokinesis  Complications: No apparent complications Patient did tolerate procedure well.  Contrast used: 80 cc  Conclusions:   1. Severe calcified CAD involving the LAD and RCA.   2. Mild aortic stenosis  Will have him return to see Dr. Jens Som.  He will likely need CABG with AVR if he is symptomatic.    Vesta Mixer, [name]., MD, [facility] 03/08/2013, 9:25 AM Office - [phone] Pager [phone]

## 2013-03-08 NOTE — OR Nursing (Signed)
Negative Allen's test right hand 

## 2013-03-22 ENCOUNTER — Encounter: Payer: Self-pay | Admitting: Nurse Practitioner

## 2013-03-22 ENCOUNTER — Ambulatory Visit (INDEPENDENT_AMBULATORY_CARE_PROVIDER_SITE_OTHER): Payer: Medicare Other | Admitting: Nurse Practitioner

## 2013-03-22 VITALS — BP 160/88 | HR 68 | Ht 70.0 in | Wt 207.8 lb

## 2013-03-22 DIAGNOSIS — I359 Nonrheumatic aortic valve disorder, unspecified: Secondary | ICD-10-CM

## 2013-03-22 DIAGNOSIS — I428 Other cardiomyopathies: Secondary | ICD-10-CM | POA: Diagnosis not present

## 2013-03-22 DIAGNOSIS — I350 Nonrheumatic aortic (valve) stenosis: Secondary | ICD-10-CM

## 2013-03-22 DIAGNOSIS — I420 Dilated cardiomyopathy: Secondary | ICD-10-CM

## 2013-03-22 LAB — BASIC METABOLIC PANEL
BUN: 16 mg/dL (ref 6–23)
CO2: 31 mEq/L (ref 19–32)
Calcium: 9.3 mg/dL (ref 8.4–10.5)
Chloride: 101 mEq/L (ref 96–112)
Creatinine, Ser: 1.1 mg/dL (ref 0.4–1.5)
GFR: 69.17 mL/min (ref >60.00)
Glucose, Bld: 84 mg/dL (ref 70–99)
Potassium: 4.1 mEq/L (ref 3.5–5.1)
Sodium: 137 mEq/L (ref 135–145)

## 2013-03-22 MED ORDER — LISINOPRIL 2.5 MG PO TABS: 5.0000 mg | ORAL_TABLET | Freq: Every day | ORAL | Status: DC

## 2013-03-22 NOTE — Progress Notes (Signed)
Steven Everett Date of Birth: 07-11-1935 Medical Record #[id number]  History of Present Illness: Steven Everett is seen back today for a 3 week check. He is seen for Dr. Jens Som. He has an EF of 35% per echo in January of 2014. Grade 1 diastolic dysfunction and mild AS but moderate based on visual appearance. TEE in March of 2014 showed EF of 30 to 35% and akinesis of the inferior and proximal septal myocardium, moderate AS and mild MR. Etiology of his CM unclear and he was referred for cath.  His other issues include hypothyroidism, HLD, HTN, GERD, type 2 DM, rheumatic fever, prior lung cancer and cirrhosis.   He comes back today. He is here with his wife and son. He is doing just fine. He remains totally asymptomatic. No chest pain. Not short of breath. Not dizzy or lightheaded. BP at home is good. He remains very active[facility]   Current Outpatient Prescriptions on File Prior to Visit  Medication Sig Dispense Refill  . aspirin EC 81 MG tablet Take 1 tablet (81 mg total) by mouth daily.  90 tablet  3  . carvedilol (COREG) 3.125 MG tablet Take 1 tablet (3.125 mg total) by mouth 2 (two) times daily.  60 tablet  12  . fish oil-omega-3 fatty acids 1000 MG capsule Take two tablets daily      . levothyroxine (SYNTHROID, LEVOTHROID) 112 MCG tablet TAKE 1 TABLET ONCE DAILY  30 tablet  3  . lisinopril (PRINIVIL,ZESTRIL) 2.5 MG tablet Take 1 tablet (2.5 mg total) by mouth daily.  30 tablet  12   No current facility-administered medications on file prior to visit.    Allergies  Allergen Reactions  . Atorvastatin     REACTION: joint aches    Past Medical History  Diagnosis Date  . Hemorrhoids, internal   . Diverticulosis of colon   . Barrett's esophagus   . Cirrhosis of liver     w/o mention of alcohol  . Cancer     lung, lower lobe  . Seborrheic dermatitis   . Hyperlipidemia   . Hypothyroidism   . Hypertension   . GERD (gastroesophageal reflux disease)   .  Diabetes mellitus     type 2  . Rheumatic fever     Past Surgical History  Procedure Laterality Date  . Right middle and lower lobectomy  1998    Dr Edwyna Shell- for lung cancer by Dr Gershon Crane  . Neck surgery  6-00    Dr Jordan Likes  . Appendectomy    . Back cervical    . Right hip replacement      Dr Charlann Boxer  . Foot surgery    . Cardiac catheterization    . Bilat cat surgery  8-11    Dr Pecola Leisure  . Colonoscopy    . Tee without cardioversion N/A 02/14/2013    Procedure: TRANSESOPHAGEAL ECHOCARDIOGRAM (TEE);  Surgeon: Lewayne Bunting, Steven Everett;  Location: [facility] ENDOSCOPY;  Service: Cardiovascular;  Laterality: N/A;    History  Smoking status  . Former Smoker  . Quit date: 02/14/1989  Smokeless tobacco  . Never Used    History  Alcohol Use No    Family History  Problem Relation Age of Onset  . Cancer Mother     breast  . Diabetes Mother   . Cancer Father     Lung  . Diabetes      Review of Systems: The review of systems  is per the HPI.  All other systems were reviewed and are negative.  Physical Exam: Ht 5\' 10"  (1.778 m)  Wt 207 lb 12.8 oz (94.257 kg)  BMI 29.82 kg/m2 Patient is very pleasant and in no acute distress. Skin is warm and dry. Color is normal.  HEENT is unremarkable. Normocephalic/atraumatic. PERRL. Sclera are nonicteric. Neck is supple. No masses. No JVD. Lungs are clear. Cardiac exam shows a regular rate and rhythm. Harsh outflow murmur noted. Abdomen is soft. Extremities are without edema. Gait and ROM are intact. No gross neurologic deficits noted.  LABORATORY DATA: BMET is pending for today.  Lab Results  Component Value Date   WBC 6.1 03/02/2013   HGB 15.1 03/02/2013   HCT 45.2 03/02/2013   PLT 165 03/02/2013   GLUCOSE 98 03/02/2013   CHOL 151 11/16/2012   TRIG 59 11/16/2012   HDL 60 11/16/2012   LDLDIRECT 57 04/23/2011   LDLCALC 79 11/16/2012   ALT 33 11/16/2012   AST 31 11/16/2012   NA 140 03/02/2013   K 4.6 03/02/2013   CL 105 03/02/2013   CREATININE 1.10 03/02/2013    BUN 19 03/02/2013   CO2 26 03/02/2013   TSH 1.533 11/16/2012   INR 1.13 03/02/2013   HGBA1C 5.4 11/16/2012    Coronary Angiography   Left Main: ostial 30-40%  Left anterior Descending: heavily calcified, proximal ulceration 30%, mid vessel is heavily calcified, Sequential 70% and then 80%, eccentric stenosis.  Left Circumflex: mild irregularities  Right Coronary Artery: large, dominant, heavily calcified. Proximal 60-70%, mid 70% eccentric stenosis   LV Gram: moderate LV dysfunction, global hypokinesis with moderate inferior hypokinesis   Conclusions:  1. Severe calcified CAD involving the LAD and RCA.  2. Mild aortic stenosis  Will have him return to see Dr. Jens Som. He will likely need CABG with AVR if he is symptomatic.   Steven Everett, Steven Hageman., Steven Everett, [facility]  03/08/2013, 9:25 AM  Office - [phone]  Pager [phone]  Assessment / Plan:  S/P cardiac cath - does not look to have an ischemic CM - EF is reduced. He does have coronary disease. His cath films have been reviewed today with Dr. Jens Som and Dr. Clifton James. They have recommended medical therapy since the patient is asymptomatic. PCI is felt to be an option if needed. Would try to titrate his medicines up slowly - avoid hypotension with his AS. We will recheck BMET today. Repeat BP by me is 120/80. I have increased the Lisinopril to 5 mg. He is to see Dr. Jens Som in 2 to 3 weeks in the [facility] office per their request.   2. AS - felt to be mild per cath  Patient is agreeable to this plan and will call if any problems develop in the interim.   Rosalio Macadamia, RN, ANP-C [facility] [address] [place], [place]  [address]

## 2013-03-22 NOTE — Patient Instructions (Addendum)
Stay on your current medicines - except we are going to increase Lisinopril to 2 pills each day (total of 5 mg) - we are going to manage your heart disease and weakness of your heart muscle with medicines.  Call us if you start having any chest pain, shortness of breath, etc  See Dr. Jens Som in [place] in 2 to 3 weeks  Call the [facility] office at [phone] if you have any questions, problems or concerns.

## 2013-03-28 ENCOUNTER — Telehealth: Payer: Self-pay | Admitting: Cardiology

## 2013-03-28 DIAGNOSIS — I420 Dilated cardiomyopathy: Secondary | ICD-10-CM

## 2013-03-28 DIAGNOSIS — I350 Nonrheumatic aortic (valve) stenosis: Secondary | ICD-10-CM

## 2013-03-28 MED ORDER — LISINOPRIL 5 MG PO TABS: 5.0000 mg | ORAL_TABLET | Freq: Every day | ORAL | Status: DC

## 2013-03-28 NOTE — Telephone Encounter (Signed)
Spoke with pt, his lisinopril was increased to 5 mg at his last office visit. He needs a new script at the pharm.

## 2013-03-28 NOTE — Telephone Encounter (Signed)
New problem   Needs to know if his prescription will be doubled

## 2013-03-29 DIAGNOSIS — H43819 Vitreous degeneration, unspecified eye: Secondary | ICD-10-CM | POA: Diagnosis not present

## 2013-03-29 DIAGNOSIS — E119 Type 2 diabetes mellitus without complications: Secondary | ICD-10-CM | POA: Diagnosis not present

## 2013-03-29 DIAGNOSIS — H526 Other disorders of refraction: Secondary | ICD-10-CM | POA: Diagnosis not present

## 2013-03-29 DIAGNOSIS — Z9849 Cataract extraction status, unspecified eye: Secondary | ICD-10-CM | POA: Diagnosis not present

## 2013-04-06 ENCOUNTER — Ambulatory Visit: Payer: Medicare Other | Admitting: Cardiology

## 2013-04-06 ENCOUNTER — Ambulatory Visit (INDEPENDENT_AMBULATORY_CARE_PROVIDER_SITE_OTHER): Payer: Medicare Other | Admitting: Cardiology

## 2013-04-06 ENCOUNTER — Encounter: Payer: Self-pay | Admitting: Cardiology

## 2013-04-06 VITALS — BP 115/80 | HR 77 | Wt 207.0 lb

## 2013-04-06 DIAGNOSIS — I420 Dilated cardiomyopathy: Secondary | ICD-10-CM

## 2013-04-06 DIAGNOSIS — I359 Nonrheumatic aortic valve disorder, unspecified: Secondary | ICD-10-CM | POA: Diagnosis not present

## 2013-04-06 DIAGNOSIS — I428 Other cardiomyopathies: Secondary | ICD-10-CM | POA: Diagnosis not present

## 2013-04-06 DIAGNOSIS — I2510 Atherosclerotic heart disease of native coronary artery without angina pectoris: Secondary | ICD-10-CM | POA: Insufficient documentation

## 2013-04-06 DIAGNOSIS — I350 Nonrheumatic aortic (valve) stenosis: Secondary | ICD-10-CM

## 2013-04-06 MED ORDER — CARVEDILOL 6.25 MG PO TABS: 6.2500 mg | ORAL_TABLET | Freq: Two times a day (BID) | ORAL | Status: DC

## 2013-04-06 MED ORDER — ROSUVASTATIN CALCIUM 10 MG PO TABS: 10.0000 mg | ORAL_TABLET | Freq: Every day | ORAL | Status: DC

## 2013-04-06 NOTE — Assessment & Plan Note (Signed)
Appears to be mild to moderate based on transthoracic echocardiogram, transesophageal echocardiogram and catheterization. We will plan followup echoes in the future.

## 2013-04-06 NOTE — Assessment & Plan Note (Signed)
Patient has a cardiomyopathy of uncertain etiology. He does have coronary disease but I reviewed his catheterization films with Dr Clifton James. It appears that his cardiomyopathy is out of proportion to coronary disease. We felt medical therapy for his coronary disease was indicated. TSH and ferritin are normal. Plan to continue lisinopril. Increase Coreg to 6.25 mg by mouth twice a day. I will most likely not be able to advance these medications further as his blood pressure is borderline. He also has aortic stenosis. Plan repeat echocardiogram in 3 months. If ejection fraction remains less than 35% despite medications we will consider ICD.

## 2013-04-06 NOTE — Progress Notes (Signed)
HPI: Pleasant male for fu of AS. Previously seen for asymptomatic murmur. Echocardiogram in January of 2014 showed an ejection fraction of 35%. There was grade 1 diastolic dysfunction. Will there was mild aortic stenosis by gradient but moderate based on visual appearance. There was trace aortic insufficiency. There was mild left atrial enlargement. RV function mildly reduced. Transesophageal echocardiogram in March of 2014 showed an ejection fraction of 30-35% and akinesis of the inferior and proximal septal myocardium. There was moderate aortic stenosis (AVA by planimetry 1.2-1.4 cm2) and mild mitral regurgitation. There was mild left atrial enlargement. Cardiac catheterization in April of 2014 showed a pulmonary catheter wedge pressure of 13, 30-40% left main, 70 followed by 80% mid LAD, 60-70% proximal right coronary artery and 70% mid. There was moderate LV dysfunction. There was mild aortic stenosis. I reviewed films with Dr Clifton James and medical therapy felt indicated. We therefore plan to titrate meds and then repeat echo. Note previous ferritin and TSH normal. Since I last saw him, the patient denies any dyspnea on exertion, orthopnea, PND, pedal edema, palpitations, syncope or chest pain.   Current Outpatient Prescriptions  Medication Sig Dispense Refill  . aspirin EC 81 MG tablet Take 1 tablet (81 mg total) by mouth daily.  90 tablet  3  . carvedilol (COREG) 3.125 MG tablet Take 1 tablet (3.125 mg total) by mouth 2 (two) times daily.  60 tablet  12  . fish oil-omega-3 fatty acids 1000 MG capsule Take two tablets daily      . levothyroxine (SYNTHROID, LEVOTHROID) 112 MCG tablet TAKE 1 TABLET ONCE DAILY  30 tablet  3  . lisinopril (PRINIVIL,ZESTRIL) 5 MG tablet Take 1 tablet (5 mg total) by mouth daily.  30 tablet  12   No current facility-administered medications for this visit.     Past Medical History  Diagnosis Date  . Hemorrhoids, internal   . Diverticulosis of colon   . Barrett's  esophagus   . Cirrhosis of liver     w/o mention of alcohol  . Cancer     lung, lower lobe  . Seborrheic dermatitis   . Hyperlipidemia   . Hypothyroidism   . Hypertension   . GERD (gastroesophageal reflux disease)   . Diabetes mellitus     type 2  . Rheumatic fever   . Aortic stenosis     Past Surgical History  Procedure Laterality Date  . Right middle and lower lobectomy  1998    Dr Edwyna Shell- for lung cancer by Dr Gershon Crane  . Neck surgery  6-00    Dr Jordan Likes  . Appendectomy    . Back cervical    . Right hip replacement      Dr Charlann Boxer  . Foot surgery    . Cardiac catheterization    . Bilat cat surgery  8-11    Dr Pecola Leisure  . Colonoscopy    . Tee without cardioversion N/A 02/14/2013    Procedure: TRANSESOPHAGEAL ECHOCARDIOGRAM (TEE);  Surgeon: Lewayne Bunting, MD;  Location: [facility] ENDOSCOPY;  Service: Cardiovascular;  Laterality: N/A;    History   Social History  . Marital Status: Married    Spouse Name: N/A    Number of Children: 1  . Years of Education: N/A   Occupational History  .     Social History Main Topics  . Smoking status: Former Smoker    Quit date: 02/14/1989  . Smokeless tobacco: Never Used  . Alcohol Use: No  . Drug Use: No  .  Sexually Active: Not on file   Other Topics Concern  . Not on file   Social History Narrative  . No narrative on file    ROS: no fevers or chills, productive cough, hemoptysis, dysphasia, odynophagia, melena, hematochezia, dysuria, hematuria, rash, seizure activity, orthopnea, PND, pedal edema, claudication. Remaining systems are negative.  Physical Exam: Well-developed well-nourished in no acute distress.  Skin is warm and dry.  HEENT is normal.  Neck is supple.  Chest is clear to auscultation with normal expansion.  Cardiovascular exam is regular rate and rhythm. 2/6 systolic murmur left sternal border. S2 is not diminished. Abdominal exam nontender or distended. No masses palpated. Extremities show no edema. neuro  grossly intact

## 2013-04-06 NOTE — Assessment & Plan Note (Signed)
Continue aspirin. Patient did not tolerate Lipitor in the past. I will try Crestor 10 mg daily. Check lipids and liver in 6 weeks. We have elected to treat medically at this point.

## 2013-04-06 NOTE — Patient Instructions (Addendum)
Your physician recommends that you schedule a follow-up appointment in: 3 MONTHS WITH DR CRENSHAW IN [place]  START CRESTOR 10 MG ONCE DAILY  Your physician recommends that you return for lab work in: 6 WEEKS= IN [place]= DO NOT EAT PRIOR TO LAB WORK  INCREASE CARVEDILOL TO 6.25 MG ONCE DAILY  Your physician has requested that you have an echocardiogram. Echocardiography is a painless test that uses sound waves to create images of your heart. It provides your doctor with information about the size and shape of your heart and how well your heart's chambers and valves are working. This procedure takes approximately one hour. There are no restrictions for this procedure.

## 2013-04-25 ENCOUNTER — Other Ambulatory Visit: Payer: Self-pay | Admitting: Family Medicine

## 2013-05-24 ENCOUNTER — Encounter: Payer: Self-pay | Admitting: Family Medicine

## 2013-05-24 ENCOUNTER — Encounter: Payer: Self-pay | Admitting: *Deleted

## 2013-05-24 ENCOUNTER — Ambulatory Visit (INDEPENDENT_AMBULATORY_CARE_PROVIDER_SITE_OTHER): Payer: Medicare Other | Admitting: Family Medicine

## 2013-05-24 VITALS — BP 128/72 | HR 52 | Wt 205.0 lb

## 2013-05-24 DIAGNOSIS — I2510 Atherosclerotic heart disease of native coronary artery without angina pectoris: Secondary | ICD-10-CM

## 2013-05-24 DIAGNOSIS — I428 Other cardiomyopathies: Secondary | ICD-10-CM | POA: Diagnosis not present

## 2013-05-24 DIAGNOSIS — J309 Allergic rhinitis, unspecified: Secondary | ICD-10-CM | POA: Diagnosis not present

## 2013-05-24 DIAGNOSIS — E039 Hypothyroidism, unspecified: Secondary | ICD-10-CM

## 2013-05-24 DIAGNOSIS — K7460 Unspecified cirrhosis of liver: Secondary | ICD-10-CM | POA: Diagnosis not present

## 2013-05-24 DIAGNOSIS — I359 Nonrheumatic aortic valve disorder, unspecified: Secondary | ICD-10-CM | POA: Diagnosis not present

## 2013-05-24 LAB — HEPATIC FUNCTION PANEL
ALT: 41 U/L (ref 0–53)
AST: 30 U/L (ref 0–37)
Alkaline Phosphatase: 79 U/L (ref 39–117)
Bilirubin, Direct: 0.2 mg/dL (ref 0.0–0.3)
Indirect Bilirubin: 0.5 mg/dL (ref 0.0–0.9)

## 2013-05-24 LAB — LIPID PANEL
Cholesterol: 103 mg/dL (ref 0–200)
Total CHOL/HDL Ratio: 2.1 Ratio

## 2013-05-24 LAB — TSH: TSH: 1.028 u[IU]/mL (ref 0.350–4.500)

## 2013-05-24 NOTE — Progress Notes (Signed)
  Subjective:    Patient ID: Steven Everett, male    DOB: [birth year], 78 y.o.   MRN: [id number]  HPI Hypothyroid - no recent skin or hair changes. No changes or fluctuations in weight. He does take his medication in the morning before breakfast with no other medications.  Ear itching - has been worse this spring. Has used drops in the past.  No fever or pain or heraing loss. He has had concomitant sneezing and watery eyes. No scratchy or itchy throat. No cough. No fever.  CAD - doing well. BPS at he gym running 140s.  Occ feels lightheaded since the increase on his carvedilol. He still continues to go to the gym and work out 3 days per week. He denies any chest pain or shortness of breath. He has a lab slip for lipids and liver enzymes to be done by Dr. Jens Som.   Review of Systems     Objective:   Physical Exam  Constitutional: He is oriented to person, place, and time. He appears well-developed and well-nourished.  HENT:  Head: Normocephalic and atraumatic.  Right Ear: External ear normal.  Left Ear: External ear normal.  TM and canal are clear bilaterally.   Eyes: Conjunctivae and EOM are normal. Pupils are equal, round, and reactive to light.  Cardiovascular: Normal rate, regular rhythm and normal heart sounds.   Pulmonary/Chest: Effort normal and breath sounds normal.  Musculoskeletal: He exhibits no edema.  Neurological: He is alert and oriented to person, place, and time.  Skin: Skin is warm and dry.  Psychiatric: He has a normal mood and affect. His behavior is normal.          Assessment & Plan:  Hypothyroid - recheck levels today.  Continue current regimen of levels are stable.  Coronary artery disease-he is doing well on carvedilol, ACE inhibitor, statin, baby aspirin daily. Blood pressure is well-controlled today.  Ear itching-consistent with allergic rhinitis. He is also having watery eyes and some sneezing. Recommend a trial of Claritin over-the-counter. Monitor  for side effects and sedation. If this is not helpful after one to 2 weeks then consider using VoSol drops. Ear exam is normal.

## 2013-05-24 NOTE — Progress Notes (Signed)
Quick Note:  All labs are normal. ______ 

## 2013-05-24 NOTE — Assessment & Plan Note (Signed)
Getting repeat liver enzymes this week.

## 2013-06-27 ENCOUNTER — Encounter: Payer: Self-pay | Admitting: Family Medicine

## 2013-06-27 ENCOUNTER — Ambulatory Visit (INDEPENDENT_AMBULATORY_CARE_PROVIDER_SITE_OTHER): Payer: Medicare Other | Admitting: Family Medicine

## 2013-06-27 VITALS — BP 128/74 | Temp 98.4°F

## 2013-06-27 DIAGNOSIS — J019 Acute sinusitis, unspecified: Secondary | ICD-10-CM

## 2013-06-27 DIAGNOSIS — J209 Acute bronchitis, unspecified: Secondary | ICD-10-CM | POA: Diagnosis not present

## 2013-06-27 MED ORDER — ACETIC ACID 2 % OT SOLN: 4.0000 [drp] | Freq: Three times a day (TID) | OTIC | Status: DC

## 2013-06-27 MED ORDER — AMOXICILLIN-POT CLAVULANATE 875-125 MG PO TABS: 1.0000 | ORAL_TABLET | Freq: Two times a day (BID) | ORAL | Status: DC

## 2013-06-27 NOTE — Progress Notes (Signed)
  Subjective:    Patient ID: Steven Everett, male    DOB: 1935-07-08, 78 y.o.   MRN: [id number]  HPI 2 weeks sinus congestion.  No fever, chills or sweats. Cough is dry as well.  No ear pain or ST.  No HA.   No temp today. No OTC meds.  NO facila pain.   Review of Systems     Objective:   Physical Exam  Constitutional: He is oriented to person, place, and time. He appears well-developed and well-nourished.  HENT:  Head: Normocephalic and atraumatic.  Right Ear: External ear normal.  Left Ear: External ear normal.  Nose: Nose normal.  Mouth/Throat: Oropharynx is clear and moist.  TMs and canals are clear.   Eyes: Conjunctivae and EOM are normal. Pupils are equal, round, and reactive to light.  Neck: Neck supple. No thyromegaly present.  Cardiovascular: Normal rate and normal heart sounds.   2/6 SEM  Pulmonary/Chest: Effort normal. He has wheezes.  Faint expiratory wheeze on exam  Lymphadenopathy:    He has no cervical adenopathy.  Neurological: He is alert and oriented to person, place, and time.  Skin: Skin is warm and dry.  Psychiatric: He has a normal mood and affect.          Assessment & Plan:  Acute sinusitis/bronchitis - hx of lung cancer.  Will tx with augmentin.  Call if not better in one week.  Call if bc SOB. Offered to give him an inhaler for the slight wheezing that he's experiencing but he says he used 1 years ago and it wasn't helpful at all and is not interested.

## 2013-06-27 NOTE — Patient Instructions (Addendum)
Recommend trial of Claritin for 2 week and seen if helps and if helps your ear itching. Sinusitis Sinusitis is redness, soreness, and swelling (inflammation) of the paranasal sinuses. Paranasal sinuses are air pockets within the bones of your face (beneath the eyes, the middle of the forehead, or above the eyes). In healthy paranasal sinuses, mucus is able to drain out, and air is able to circulate through them by way of your nose. However, when your paranasal sinuses are inflamed, mucus and air can become trapped. This can allow bacteria and other germs to grow and cause infection. Sinusitis can develop quickly and last only a short time (acute) or continue over a long period (chronic). Sinusitis that lasts for more than 12 weeks is considered chronic.  CAUSES  Causes of sinusitis include:  Allergies.  Structural abnormalities, such as displacement of the cartilage that separates your nostrils (deviated septum), which can decrease the air flow through your nose and sinuses and affect sinus drainage.  Functional abnormalities, such as when the small hairs (cilia) that line your sinuses and help remove mucus do not work properly or are not present. SYMPTOMS  Symptoms of acute and chronic sinusitis are the same. The primary symptoms are pain and pressure around the affected sinuses. Other symptoms include:  Upper toothache.  Earache.  Headache.  Bad breath.  Decreased sense of smell and taste.  A cough, which worsens when you are lying flat.  Fatigue.  Fever.  Thick drainage from your nose, which often is green and may contain pus (purulent).  Swelling and warmth over the affected sinuses. DIAGNOSIS  Your caregiver will perform a physical exam. During the exam, your caregiver may:  Look in your nose for signs of abnormal growths in your nostrils (nasal polyps).  Tap over the affected sinus to check for signs of infection.  View the inside of your sinuses (endoscopy) with a  special imaging device with a light attached (endoscope), which is inserted into your sinuses. If your caregiver suspects that you have chronic sinusitis, one or more of the following tests may be recommended:  Allergy tests.  Nasal culture A sample of mucus is taken from your nose and sent to a lab and screened for bacteria.  Nasal cytology A sample of mucus is taken from your nose and examined by your caregiver to determine if your sinusitis is related to an allergy. TREATMENT  Most cases of acute sinusitis are related to a viral infection and will resolve on their own within 10 days. Sometimes medicines are prescribed to help relieve symptoms (pain medicine, decongestants, nasal steroid sprays, or saline sprays).  However, for sinusitis related to a bacterial infection, your caregiver will prescribe antibiotic medicines. These are medicines that will help kill the bacteria causing the infection.  Rarely, sinusitis is caused by a fungal infection. In theses cases, your caregiver will prescribe antifungal medicine. For some cases of chronic sinusitis, surgery is needed. Generally, these are cases in which sinusitis recurs more than 3 times per year, despite other treatments. HOME CARE INSTRUCTIONS   Drink plenty of water. Water helps thin the mucus so your sinuses can drain more easily.  Use a humidifier.  Inhale steam 3 to 4 times a day (for example, sit in the bathroom with the shower running).  Apply a warm, moist washcloth to your face 3 to 4 times a day, or as directed by your caregiver.  Use saline nasal sprays to help moisten and clean your sinuses.  Take over-the-counter  or prescription medicines for pain, discomfort, or fever only as directed by your caregiver. SEEK IMMEDIATE MEDICAL CARE IF:  You have increasing pain or severe headaches.  You have nausea, vomiting, or drowsiness.  You have swelling around your face.  You have vision problems.  You have a stiff  neck.  You have difficulty breathing. MAKE SURE YOU:   Understand these instructions.  Will watch your condition.  Will get help right away if you are not doing well or get worse. Document Released: 11/17/2005 Document Revised: 02/09/2012 Document Reviewed: 12/02/2011 [facility] Patient Information 2014 [place], [place].

## 2013-07-12 ENCOUNTER — Other Ambulatory Visit: Payer: Self-pay

## 2013-07-12 ENCOUNTER — Ambulatory Visit (HOSPITAL_COMMUNITY): Payer: Medicare Other | Attending: Cardiology | Admitting: Radiology

## 2013-07-12 DIAGNOSIS — I2510 Atherosclerotic heart disease of native coronary artery without angina pectoris: Secondary | ICD-10-CM | POA: Diagnosis not present

## 2013-07-12 DIAGNOSIS — I428 Other cardiomyopathies: Secondary | ICD-10-CM | POA: Diagnosis not present

## 2013-07-12 DIAGNOSIS — I420 Dilated cardiomyopathy: Secondary | ICD-10-CM

## 2013-07-12 DIAGNOSIS — I359 Nonrheumatic aortic valve disorder, unspecified: Secondary | ICD-10-CM | POA: Diagnosis not present

## 2013-07-12 DIAGNOSIS — I350 Nonrheumatic aortic (valve) stenosis: Secondary | ICD-10-CM

## 2013-07-12 NOTE — Progress Notes (Signed)
Echocardiogram performed.  

## 2013-07-14 DIAGNOSIS — L219 Seborrheic dermatitis, unspecified: Secondary | ICD-10-CM | POA: Diagnosis not present

## 2013-07-20 ENCOUNTER — Ambulatory Visit (INDEPENDENT_AMBULATORY_CARE_PROVIDER_SITE_OTHER): Payer: Medicare Other | Admitting: Cardiology

## 2013-07-20 ENCOUNTER — Encounter: Payer: Self-pay | Admitting: Cardiology

## 2013-07-20 VITALS — BP 118/68 | HR 87 | Wt 205.0 lb

## 2013-07-20 DIAGNOSIS — I2510 Atherosclerotic heart disease of native coronary artery without angina pectoris: Secondary | ICD-10-CM

## 2013-07-20 DIAGNOSIS — I428 Other cardiomyopathies: Secondary | ICD-10-CM

## 2013-07-20 DIAGNOSIS — I429 Cardiomyopathy, unspecified: Secondary | ICD-10-CM

## 2013-07-20 DIAGNOSIS — E785 Hyperlipidemia, unspecified: Secondary | ICD-10-CM | POA: Diagnosis not present

## 2013-07-20 DIAGNOSIS — I350 Nonrheumatic aortic (valve) stenosis: Secondary | ICD-10-CM

## 2013-07-20 DIAGNOSIS — I420 Dilated cardiomyopathy: Secondary | ICD-10-CM

## 2013-07-20 DIAGNOSIS — I359 Nonrheumatic aortic valve disorder, unspecified: Secondary | ICD-10-CM | POA: Diagnosis not present

## 2013-07-20 NOTE — Assessment & Plan Note (Signed)
Continue statin. 

## 2013-07-20 NOTE — Assessment & Plan Note (Signed)
Patient has a cardiomyopathy of uncertain etiology. He does have coronary disease but I reviewed his catheterization films previously with Dr Clifton James. It appears that his cardiomyopathy is out of proportion to coronary disease. We felt medical therapy for his coronary disease was indicated. TSH and ferritin are normal. Plan to continue lisinopril and coreg. Repeat echocardiogram on meds shows continued LV dysfunction. Plan refer to EP for consideration of ICD.

## 2013-07-20 NOTE — Assessment & Plan Note (Signed)
Plan repeat echocardiogram in August of 2015.

## 2013-07-20 NOTE — Progress Notes (Signed)
HPI: Pleasant male for fu of AS. Previously seen for asymptomatic murmur. Echocardiogram in January of 2014 showed an ejection fraction of 35%. There was grade 1 diastolic dysfunction. Will there was mild aortic stenosis by gradient but moderate based on visual appearance. There was trace aortic insufficiency. There was mild left atrial enlargement. RV function mildly reduced. Transesophageal echocardiogram in March of 2014 showed an ejection fraction of 30-35% and akinesis of the inferior and proximal septal myocardium. There was moderate aortic stenosis (AVA by planimetry 1.2-1.4 cm2) and mild mitral regurgitation. There was mild left atrial enlargement. Cardiac catheterization in April of 2014 showed a pulmonary catheter wedge pressure of 13, 30-40% left main, 70 followed by 80% mid LAD, 60-70% proximal right coronary artery and 70% mid. There was moderate LV dysfunction. There was mild aortic stenosis. I reviewed films with Dr Clifton James and medical therapy felt indicated. We therefore planned to titrate meds and then repeat echo. Note previous ferritin and TSH normal. Echocardiogram repeated in August of 2014 and showed an ejection fraction of 30-35%, mild aortic stenosis with a mean gradient of 10 mm of mercury and bicuspid morphology, mild AI. Since he was last seen, the patient has dyspnea with more extreme activities but not with routine activities. It is relieved with rest. It is not associated with chest pain. There is no orthopnea, PND or pedal edema. There is no syncope or palpitations. There is no exertional chest pain.    Current Outpatient Prescriptions  Medication Sig Dispense Refill  . acetic acid (VOSOL) 2 % otic solution Place 4 drops into both ears 3 (three) times daily.  15 mL  0  . amoxicillin-clavulanate (AUGMENTIN) 875-125 MG per tablet Take 1 tablet by mouth 2 (two) times daily.  20 tablet  0  . aspirin EC 81 MG tablet Take 1 tablet (81 mg total) by mouth daily.  90 tablet  3  .  carvedilol (COREG) 6.25 MG tablet Take 1 tablet (6.25 mg total) by mouth 2 (two) times daily.  60 tablet  12  . fish oil-omega-3 fatty acids 1000 MG capsule Take two tablets daily      . levothyroxine (SYNTHROID, LEVOTHROID) 112 MCG tablet TAKE 1 TABLET ONCE DAILY  30 tablet  3  . lisinopril (PRINIVIL,ZESTRIL) 5 MG tablet Take 1 tablet (5 mg total) by mouth daily.  30 tablet  12  . rosuvastatin (CRESTOR) 10 MG tablet Take 1 tablet (10 mg total) by mouth at bedtime.  30 tablet  11  . predniSONE (STERAPRED UNI-PAK) 5 MG TABS tablet As directed       No current facility-administered medications for this visit.     Past Medical History  Diagnosis Date  . Hemorrhoids, internal   . Diverticulosis of colon   . Barrett's esophagus   . Cirrhosis of liver     w/o mention of alcohol  . Cancer     lung, lower lobe  . Seborrheic dermatitis   . Hyperlipidemia   . Hypothyroidism   . Hypertension   . GERD (gastroesophageal reflux disease)   . Diabetes mellitus     type 2  . Rheumatic fever   . Aortic stenosis     Past Surgical History  Procedure Laterality Date  . Right middle and lower lobectomy  1998    Dr Edwyna Shell- for lung cancer by Dr Gershon Crane  . Neck surgery  6-00    Dr Jordan Likes  . Appendectomy    . Back cervical    .  Right hip replacement      Dr Charlann Boxer  . Foot surgery    . Cardiac catheterization    . Bilat cat surgery  8-11    Dr Pecola Leisure  . Colonoscopy    . Tee without cardioversion N/A 02/14/2013    Procedure: TRANSESOPHAGEAL ECHOCARDIOGRAM (TEE);  Surgeon: Lewayne Bunting, MD;  Location: [facility] ENDOSCOPY;  Service: Cardiovascular;  Laterality: N/A;    History   Social History  . Marital Status: Married    Spouse Name: N/A    Number of Children: 1  . Years of Education: N/A   Occupational History  .     Social History Main Topics  . Smoking status: Former Smoker    Quit date: 02/14/1989  . Smokeless tobacco: Never Used  . Alcohol Use: No  . Drug Use: No  . Sexual  Activity: Not on file   Other Topics Concern  . Not on file   Social History Narrative  . No narrative on file    ROS: no fevers or chills, productive cough, hemoptysis, dysphasia, odynophagia, melena, hematochezia, dysuria, hematuria, rash, seizure activity, orthopnea, PND, pedal edema, claudication. Remaining systems are negative.  Physical Exam: Well-developed well-nourished in no acute distress.  Skin is warm and dry.  HEENT is normal.  Neck is supple.  Chest is clear to auscultation with normal expansion.  Cardiovascular exam is regular rate and rhythm. 2/6 systolic murmur left sternal border. S2 is preserved. Abdominal exam nontender or distended. No masses palpated. Extremities show no edema. neuro grossly intact  ECG sinus rhythm at a rate of 87. Diffuse T-wave changes.

## 2013-07-20 NOTE — Patient Instructions (Addendum)
Your physician wants you to follow-up in: 6 MONTHS WITH DR Jens Som IN [place] You will receive a reminder letter in the mail two months in advance. If you don't receive a letter, please call our office to schedule the follow-up appointment.   REFERRAL TO ELECTROPHYSIOLOGIST TO DISCUSS ICD IMPLANT

## 2013-07-20 NOTE — Assessment & Plan Note (Signed)
Continue aspirin and statin. 

## 2013-07-26 ENCOUNTER — Encounter: Payer: Self-pay | Admitting: *Deleted

## 2013-07-27 ENCOUNTER — Ambulatory Visit (INDEPENDENT_AMBULATORY_CARE_PROVIDER_SITE_OTHER): Payer: Medicare Other | Admitting: Internal Medicine

## 2013-07-27 ENCOUNTER — Encounter: Payer: Self-pay | Admitting: *Deleted

## 2013-07-27 ENCOUNTER — Encounter: Payer: Self-pay | Admitting: Internal Medicine

## 2013-07-27 VITALS — BP 124/72 | HR 64 | Ht 70.0 in | Wt 206.4 lb

## 2013-07-27 DIAGNOSIS — I2589 Other forms of chronic ischemic heart disease: Secondary | ICD-10-CM

## 2013-07-27 DIAGNOSIS — I255 Ischemic cardiomyopathy: Secondary | ICD-10-CM

## 2013-07-27 NOTE — Progress Notes (Signed)
ELECTROPHYSIOLOGY CONSULT NOTE  Patient ID: Steven Everett, MRN: [id number], DOB/AGE: 1935/05/01 78 y.o. Admit date: (Not on file) Date of Consult: 07/27/2013  Primary Physician: Nani Gasser, MD Primary Cardiologist: [facility]  Chief Complaint: ?ICD   HPI Steven Everett is a 78 y.o. male seen for consideration of an ICD.  He was identified to cardiomyopathy January 2014 when he presented for evaluation of an asymptomatic murmur. His ejection fraction was noted to be 35%. There is mild aortic stenosis. There is mild RV enlargement. Transesophageal echo demonstrated inferior wall motion abnormality he underwent catheterization April 2014 demonstrating tandem LAD lesions 70-80%, and RCA lesions 60-70%. Medical therapy was recommended.  Repeat echo August 2014 demonstrates an ejection fraction of 30-35% with a bicuspid aortic valve and mild aortic stenosis. This has persisted despite therapy with beta blockers and ACE inhibitors.  He has class II symptoms. He is short of breath climbing a flight of stairs.  He has had recent episodes of transient lightheadedness lasting seconds. These are not positional. They're unassociated with palpitations.  He denies edema nocturnal dyspnea orthopnea. Past Medical History  Diagnosis Date  . Hemorrhoids, internal   . Diverticulosis of colon   . Barrett's esophagus   . Cirrhosis of liver     w/o mention of alcohol  . Cancer     lung, lower lobe  . Seborrheic dermatitis   . Hyperlipidemia   . Hypothyroidism   . Hypertension   . GERD (gastroesophageal reflux disease)   . Diabetes mellitus     type 2  . Rheumatic fever   . Aortic stenosis       Surgical History:  Past Surgical History  Procedure Laterality Date  . Right middle and lower lobectomy  1998    Dr Edwyna Shell- for lung cancer by Dr Gershon Crane  . Neck surgery  6-00    Dr Jordan Likes  . Appendectomy    . Back cervical    . Right hip replacement      Dr Charlann Boxer  . Foot surgery    . Cardiac  catheterization    . Bilat cat surgery  8-11    Dr Pecola Leisure  . Colonoscopy    . Tee without cardioversion N/A 02/14/2013    Procedure: TRANSESOPHAGEAL ECHOCARDIOGRAM (TEE);  Surgeon: Lewayne Bunting, MD;  Location: [facility] ENDOSCOPY;  Service: Cardiovascular;  Laterality: N/A;     Home Meds: Prior to Admission medications   Medication Sig Start Date End Date Taking? Authorizing Provider  acetic acid (VOSOL) 2 % otic solution Place 4 drops into both ears 3 (three) times daily. 06/27/13  Yes Agapito Games, MD  aspirin EC 81 MG tablet Take 1 tablet (81 mg total) by mouth daily. 01/26/13  Yes Lewayne Bunting, MD  carvedilol (COREG) 6.25 MG tablet Take 1 tablet (6.25 mg total) by mouth 2 (two) times daily. 04/06/13  Yes Lewayne Bunting, MD  fish oil-omega-3 fatty acids 1000 MG capsule Take two tablets daily   Yes Historical Provider, MD  levothyroxine (SYNTHROID, LEVOTHROID) 112 MCG tablet TAKE 1 TABLET ONCE DAILY 04/25/13  Yes Agapito Games, MD  lisinopril (PRINIVIL,ZESTRIL) 5 MG tablet Take 1 tablet (5 mg total) by mouth daily. 03/28/13  Yes Lewayne Bunting, MD  predniSONE (STERAPRED UNI-PAK) 5 MG TABS tablet As directed 07/14/13  Yes Historical Provider, MD  rosuvastatin (CRESTOR) 10 MG tablet Take 1 tablet (10 mg total) by mouth at bedtime. 04/06/13 04/06/14 Yes Lewayne Bunting, MD  Allergies:  Allergies  Allergen Reactions  . Atorvastatin     REACTION: joint aches    History   Social History  . Marital Status: Married    Spouse Name: N/A    Number of Children: 1  . Years of Education: N/A   Occupational History  .     Social History Main Topics  . Smoking status: Former Smoker    Quit date: 02/14/1989  . Smokeless tobacco: Never Used  . Alcohol Use: No  . Drug Use: No  . Sexual Activity: Not on file   Other Topics Concern  . Not on file   Social History Narrative  . No narrative on file     Family History  Problem Relation Age of Onset  . Cancer Mother      breast  . Diabetes Mother   . Cancer Father     Lung  . Diabetes       ROS:  Please see the history of present illness.     All other systems reviewed and negative.    Physical Exam: Blood pressure 124/72, pulse 64, height 5\' 10"  (1.778 m), weight 206 lb 6.4 oz (93.622 kg). General: Well developed, well nourished male in no acute distress. Head: Normocephalic, atraumatic, sclera non-icteric, no xanthomas, nares are without discharge. EENT: normal Lymph Nodes:  none Back: without scoliosis/kyphosis, no CVA tendersness Neck: Negative for carotid bruits; upstrokes brisk. JVD not elevated. Lungs: Clear bilaterally to auscultation without wheezes, rales, or rhonchi. Breathing is unlabored. Heart: RRR with S1 S2.  2/6 systolic murmur , rubs, or gallops appreciated. Abdomen: Soft, non-tender, non-distended with normoactive bowel sounds. No hepatomegaly. No rebound/guarding. No obvious abdominal masses. Msk:  Strength and tone appear normal for age. Extremities: No clubbing or cyanosis. No edema.  Distal pedal pulses are 2+ and equal bilaterally. Skin: Warm and Dry Neuro: Alert and oriented X 3. CN III-XII intact Grossly normal sensory and motor function . Psych:  Responds to questions appropriately with a normal affect.      Labs: Cardiac Enzymes No results found for this basename: CKTOTAL, CKMB, TROPONINI,  in the last 72 hours CBC Lab Results  Component Value Date   WBC 6.1 03/02/2013   HGB 15.1 03/02/2013   HCT 45.2 03/02/2013   MCV 84.7 03/02/2013   PLT 165 03/02/2013   PROTIME: No results found for this basename: LABPROT, INR,  in the last 72 hours Chemistry No results found for this basename: NA, K, CL, CO2, BUN, CREATININE, CALCIUM, LABALBU, PROT, BILITOT, ALKPHOS, ALT, AST, GLUCOSE,  in the last 168 hours Lipids Lab Results  Component Value Date   CHOL 103 05/24/2013   HDL 50 05/24/2013   LDLCALC 44 05/24/2013   TRIG 45 05/24/2013   BNP No results found for this basename:  probnp   Miscellaneous No results found for this basename: DDIMER    Radiology/Studies:  No results found.  EKG: ECG demonstrates sinus rhythm at 79 Intervals 18/11/42 Nonspecific ST-T changes   Assessment and Plan:  Ischemic cardiomyopathy The patient has persistent left ventricular dysfunction despite guidelines directed medical therapy. He has mild class II symptoms. It is appropriate to consider ICD implantation not withstanding his age and the absence of comorbidities, e.g. COPD renal insufficiency and atrial fibrillation. We have discussed the relative merits of transvenous versus subcutaneous ICD. He would like to pursue the transvenous ICD we have discussed risks and benefits including but not limited to death perforation infection inappropriate shocks  Congestive heart  failure-systolic and chronic class II Continue guideline directed medical therapy. He is euvolemic  Episodic lightheadedness/syncope The patient has episodes of lightheadedness that are recent onset. There is no suggestion from his ECG about what might be. They're non-positional and I worry about them being arrhythmic. We'll undertake a 48 hour Holter. This may impacted this is just a single versus dual-chamber device  H is wife that about a month or 2 he passed out on the floor in the morning without warning. This heightens the elel of consern    Sherryl Manges

## 2013-07-27 NOTE — Patient Instructions (Addendum)
Your physician has recommended that you wear a holter monitor. Holter monitors are medical devices that record the heart's electrical activity. Doctors most often use these monitors to diagnose arrhythmias. Arrhythmias are problems with the speed or rhythm of the heartbeat. The monitor is a small, portable device. You can wear one while you do your normal daily activities. This is usually used to diagnose what is causing palpitations/syncope (passing out).   Your physician has recommended that you have a defibrillator inserted. An implantable cardioverter defibrillator (ICD) is a small device that is placed in your chest or, in rare cases, your abdomen. This device uses electrical pulses or shocks to help control life-threatening, irregular heartbeats that could lead the heart to suddenly stop beating (sudden cardiac arrest). Leads are attached to the ICD that goes into your heart. This is done in the hospital and usually requires an overnight stay. Please see the instruction sheet given to you today for more information.

## 2013-07-28 ENCOUNTER — Encounter (INDEPENDENT_AMBULATORY_CARE_PROVIDER_SITE_OTHER): Payer: Medicare Other

## 2013-07-28 ENCOUNTER — Other Ambulatory Visit: Payer: Self-pay | Admitting: *Deleted

## 2013-07-28 ENCOUNTER — Encounter: Payer: Self-pay | Admitting: *Deleted

## 2013-07-28 DIAGNOSIS — R42 Dizziness and giddiness: Secondary | ICD-10-CM

## 2013-07-28 DIAGNOSIS — R55 Syncope and collapse: Secondary | ICD-10-CM | POA: Diagnosis not present

## 2013-07-28 DIAGNOSIS — I255 Ischemic cardiomyopathy: Secondary | ICD-10-CM

## 2013-07-28 DIAGNOSIS — I2589 Other forms of chronic ischemic heart disease: Secondary | ICD-10-CM

## 2013-07-28 NOTE — Progress Notes (Signed)
Patient ID: Steven Everett, male   DOB: 07/31/1935, 78 y.o.   MRN: [id number] E-Cardio 48 Hour holter monitor applied to patient.

## 2013-08-02 ENCOUNTER — Encounter (HOSPITAL_COMMUNITY): Payer: Self-pay | Admitting: Pharmacy Technician

## 2013-08-02 ENCOUNTER — Ambulatory Visit (INDEPENDENT_AMBULATORY_CARE_PROVIDER_SITE_OTHER): Payer: Medicare Other | Admitting: *Deleted

## 2013-08-02 DIAGNOSIS — I2510 Atherosclerotic heart disease of native coronary artery without angina pectoris: Secondary | ICD-10-CM

## 2013-08-02 DIAGNOSIS — I359 Nonrheumatic aortic valve disorder, unspecified: Secondary | ICD-10-CM

## 2013-08-02 DIAGNOSIS — E785 Hyperlipidemia, unspecified: Secondary | ICD-10-CM | POA: Diagnosis not present

## 2013-08-02 DIAGNOSIS — I420 Dilated cardiomyopathy: Secondary | ICD-10-CM

## 2013-08-02 DIAGNOSIS — I428 Other cardiomyopathies: Secondary | ICD-10-CM | POA: Diagnosis not present

## 2013-08-02 DIAGNOSIS — I350 Nonrheumatic aortic (valve) stenosis: Secondary | ICD-10-CM

## 2013-08-02 LAB — BASIC METABOLIC PANEL
BUN: 14 mg/dL (ref 6–23)
CO2: 26 mEq/L (ref 19–32)
Calcium: 9.5 mg/dL (ref 8.4–10.5)
Creatinine, Ser: 1.2 mg/dL (ref 0.4–1.5)
Glucose, Bld: 109 mg/dL — ABNORMAL HIGH (ref 70–99)

## 2013-08-02 LAB — CBC WITH DIFFERENTIAL/PLATELET
Basophils Absolute: 0 10*3/uL (ref 0.0–0.1)
Eosinophils Absolute: 0.1 10*3/uL (ref 0.0–0.7)
Lymphocytes Relative: 15.3 % (ref 12.0–46.0)
MCHC: 33.6 g/dL (ref 30.0–36.0)
Monocytes Absolute: 0.3 10*3/uL (ref 0.1–1.0)
Neutrophils Relative %: 78.3 % — ABNORMAL HIGH (ref 43.0–77.0)
Platelets: 162 10*3/uL (ref 150.0–400.0)
RDW: 13.9 % (ref 11.5–14.6)

## 2013-08-05 DIAGNOSIS — Z23 Encounter for immunization: Secondary | ICD-10-CM | POA: Diagnosis not present

## 2013-08-08 ENCOUNTER — Institutional Professional Consult (permissible substitution): Payer: Medicare Other | Admitting: Internal Medicine

## 2013-08-11 DIAGNOSIS — I428 Other cardiomyopathies: Secondary | ICD-10-CM | POA: Diagnosis not present

## 2013-08-11 DIAGNOSIS — Z79899 Other long term (current) drug therapy: Secondary | ICD-10-CM | POA: Diagnosis not present

## 2013-08-11 DIAGNOSIS — I10 Essential (primary) hypertension: Secondary | ICD-10-CM | POA: Diagnosis not present

## 2013-08-11 DIAGNOSIS — E785 Hyperlipidemia, unspecified: Secondary | ICD-10-CM | POA: Diagnosis not present

## 2013-08-11 DIAGNOSIS — E119 Type 2 diabetes mellitus without complications: Secondary | ICD-10-CM | POA: Diagnosis not present

## 2013-08-11 DIAGNOSIS — I2510 Atherosclerotic heart disease of native coronary artery without angina pectoris: Secondary | ICD-10-CM | POA: Diagnosis not present

## 2013-08-11 DIAGNOSIS — E039 Hypothyroidism, unspecified: Secondary | ICD-10-CM | POA: Diagnosis not present

## 2013-08-11 DIAGNOSIS — I5022 Chronic systolic (congestive) heart failure: Secondary | ICD-10-CM | POA: Diagnosis not present

## 2013-08-11 DIAGNOSIS — I495 Sick sinus syndrome: Secondary | ICD-10-CM | POA: Diagnosis not present

## 2013-08-11 DIAGNOSIS — Q231 Congenital insufficiency of aortic valve: Secondary | ICD-10-CM | POA: Diagnosis not present

## 2013-08-11 DIAGNOSIS — K219 Gastro-esophageal reflux disease without esophagitis: Secondary | ICD-10-CM | POA: Diagnosis not present

## 2013-08-11 DIAGNOSIS — I359 Nonrheumatic aortic valve disorder, unspecified: Secondary | ICD-10-CM | POA: Diagnosis not present

## 2013-08-11 DIAGNOSIS — R55 Syncope and collapse: Secondary | ICD-10-CM | POA: Diagnosis not present

## 2013-08-11 DIAGNOSIS — K2270 Barrett's esophagus without dysplasia: Secondary | ICD-10-CM | POA: Diagnosis not present

## 2013-08-11 DIAGNOSIS — I509 Heart failure, unspecified: Secondary | ICD-10-CM | POA: Diagnosis not present

## 2013-08-11 DIAGNOSIS — I2589 Other forms of chronic ischemic heart disease: Secondary | ICD-10-CM | POA: Diagnosis not present

## 2013-08-11 MED ORDER — CEFAZOLIN SODIUM-DEXTROSE 2-3 GM-% IV SOLR
2.0000 g | INTRAVENOUS | Status: DC
  Filled 2013-08-11: qty 50

## 2013-08-11 MED ORDER — SODIUM CHLORIDE 0.9 % IR SOLN
80.0000 mg | Status: DC
  Filled 2013-08-11: qty 2

## 2013-08-12 ENCOUNTER — Ambulatory Visit (HOSPITAL_COMMUNITY)
Admission: RE | Admit: 2013-08-12 | Discharge: 2013-08-13 | Disposition: A | Discharge status: Home / Self Care | Payer: Medicare Other | Source: Ambulatory Visit | Attending: Internal Medicine | Admitting: Internal Medicine

## 2013-08-12 ENCOUNTER — Encounter (HOSPITAL_COMMUNITY)
Admission: RE | Disposition: A | Discharge status: Home / Self Care | Payer: Self-pay | Source: Ambulatory Visit | Attending: Internal Medicine

## 2013-08-12 DIAGNOSIS — I2589 Other forms of chronic ischemic heart disease: Secondary | ICD-10-CM | POA: Insufficient documentation

## 2013-08-12 DIAGNOSIS — R55 Syncope and collapse: Secondary | ICD-10-CM | POA: Insufficient documentation

## 2013-08-12 DIAGNOSIS — E119 Type 2 diabetes mellitus without complications: Secondary | ICD-10-CM | POA: Insufficient documentation

## 2013-08-12 DIAGNOSIS — K219 Gastro-esophageal reflux disease without esophagitis: Secondary | ICD-10-CM | POA: Insufficient documentation

## 2013-08-12 DIAGNOSIS — I10 Essential (primary) hypertension: Secondary | ICD-10-CM | POA: Insufficient documentation

## 2013-08-12 DIAGNOSIS — E039 Hypothyroidism, unspecified: Secondary | ICD-10-CM | POA: Insufficient documentation

## 2013-08-12 DIAGNOSIS — I428 Other cardiomyopathies: Secondary | ICD-10-CM | POA: Insufficient documentation

## 2013-08-12 DIAGNOSIS — I495 Sick sinus syndrome: Secondary | ICD-10-CM | POA: Diagnosis not present

## 2013-08-12 DIAGNOSIS — I5022 Chronic systolic (congestive) heart failure: Secondary | ICD-10-CM

## 2013-08-12 DIAGNOSIS — I509 Heart failure, unspecified: Secondary | ICD-10-CM | POA: Insufficient documentation

## 2013-08-12 DIAGNOSIS — I255 Ischemic cardiomyopathy: Secondary | ICD-10-CM

## 2013-08-12 DIAGNOSIS — Z79899 Other long term (current) drug therapy: Secondary | ICD-10-CM | POA: Insufficient documentation

## 2013-08-12 DIAGNOSIS — E785 Hyperlipidemia, unspecified: Secondary | ICD-10-CM | POA: Insufficient documentation

## 2013-08-12 DIAGNOSIS — Q231 Congenital insufficiency of aortic valve: Secondary | ICD-10-CM | POA: Diagnosis not present

## 2013-08-12 DIAGNOSIS — I420 Dilated cardiomyopathy: Secondary | ICD-10-CM | POA: Diagnosis present

## 2013-08-12 DIAGNOSIS — I359 Nonrheumatic aortic valve disorder, unspecified: Secondary | ICD-10-CM | POA: Insufficient documentation

## 2013-08-12 DIAGNOSIS — R001 Bradycardia, unspecified: Secondary | ICD-10-CM

## 2013-08-12 DIAGNOSIS — K2270 Barrett's esophagus without dysplasia: Secondary | ICD-10-CM | POA: Insufficient documentation

## 2013-08-12 DIAGNOSIS — I2510 Atherosclerotic heart disease of native coronary artery without angina pectoris: Secondary | ICD-10-CM | POA: Diagnosis present

## 2013-08-12 HISTORY — PX: CARDIAC DEFIBRILLATOR PLACEMENT: SHX171

## 2013-08-12 HISTORY — PX: IMPLANTABLE CARDIOVERTER DEFIBRILLATOR IMPLANT: SHX5473

## 2013-08-12 LAB — SURGICAL PCR SCREEN: MRSA, PCR: NEGATIVE

## 2013-08-12 SURGERY — IMPLANTABLE CARDIOVERTER DEFIBRILLATOR IMPLANT
Anesthesia: LOCAL

## 2013-08-12 MED ORDER — SODIUM CHLORIDE 0.9 % IJ SOLN
3.0000 mL | Freq: Two times a day (BID) | INTRAMUSCULAR | Status: DC
  Administered 2013-08-12: 3 mL via INTRAVENOUS

## 2013-08-12 MED ORDER — CEFAZOLIN SODIUM 1-5 GM-% IV SOLN
1.0000 g | Freq: Four times a day (QID) | INTRAVENOUS | Status: AC
  Administered 2013-08-12 – 2013-08-13 (×3): 1 g via INTRAVENOUS
  Filled 2013-08-12 (×3): qty 50

## 2013-08-12 MED ORDER — CARVEDILOL 6.25 MG PO TABS
6.2500 mg | ORAL_TABLET | Freq: Two times a day (BID) | ORAL | Status: DC
  Administered 2013-08-12 (×2): 6.25 mg via ORAL
  Filled 2013-08-12 (×4): qty 1

## 2013-08-12 MED ORDER — SODIUM CHLORIDE 0.9 % IJ SOLN
3.0000 mL | INTRAMUSCULAR | Status: DC | PRN
  Administered 2013-08-12: 21:00:00 via INTRAVENOUS

## 2013-08-12 MED ORDER — MUPIROCIN 2 % EX OINT
TOPICAL_OINTMENT | CUTANEOUS | Status: AC
  Filled 2013-08-12: qty 22

## 2013-08-12 MED ORDER — HYDROCODONE-ACETAMINOPHEN 5-325 MG PO TABS: 1.0000 | ORAL_TABLET | ORAL | Status: DC | PRN

## 2013-08-12 MED ORDER — ASPIRIN EC 81 MG PO TBEC
81.0000 mg | DELAYED_RELEASE_TABLET | Freq: Every day | ORAL | Status: DC
  Administered 2013-08-12: 81 mg via ORAL
  Filled 2013-08-12 (×2): qty 1

## 2013-08-12 MED ORDER — LIDOCAINE HCL (PF) 1 % IJ SOLN
INTRAMUSCULAR | Status: AC
  Filled 2013-08-12: qty 60

## 2013-08-12 MED ORDER — HEPARIN (PORCINE) IN NACL 2-0.9 UNIT/ML-% IJ SOLN
INTRAMUSCULAR | Status: AC
  Filled 2013-08-12: qty 500

## 2013-08-12 MED ORDER — ONDANSETRON HCL 4 MG/2ML IJ SOLN: 4.0000 mg | Freq: Four times a day (QID) | INTRAMUSCULAR | Status: DC | PRN

## 2013-08-12 MED ORDER — ACETAMINOPHEN 325 MG PO TABS: 325.0000 mg | ORAL_TABLET | ORAL | Status: DC | PRN

## 2013-08-12 MED ORDER — LEVOTHYROXINE SODIUM 112 MCG PO TABS
112.0000 ug | ORAL_TABLET | Freq: Every day | ORAL | Status: DC
  Filled 2013-08-12 (×2): qty 1

## 2013-08-12 MED ORDER — MUPIROCIN 2 % EX OINT
TOPICAL_OINTMENT | Freq: Two times a day (BID) | CUTANEOUS | Status: DC
  Administered 2013-08-12: 1 via NASAL
  Filled 2013-08-12: qty 22

## 2013-08-12 MED ORDER — SODIUM CHLORIDE 0.9 % IV SOLN
INTRAVENOUS | Status: DC
  Administered 2013-08-12: 06:00:00 via INTRAVENOUS

## 2013-08-12 MED ORDER — CHLORHEXIDINE GLUCONATE 4 % EX LIQD
60.0000 mL | Freq: Once | CUTANEOUS | Status: DC
  Filled 2013-08-12: qty 60

## 2013-08-12 MED ORDER — SODIUM CHLORIDE 0.9 % IV SOLN: 250.0000 mL | INTRAVENOUS | Status: DC | PRN

## 2013-08-12 MED ORDER — MIDAZOLAM HCL 5 MG/5ML IJ SOLN
INTRAMUSCULAR | Status: AC
  Filled 2013-08-12: qty 5

## 2013-08-12 MED ORDER — LISINOPRIL 5 MG PO TABS
5.0000 mg | ORAL_TABLET | Freq: Every day | ORAL | Status: DC
  Administered 2013-08-12: 5 mg via ORAL
  Filled 2013-08-12 (×2): qty 1

## 2013-08-12 MED ORDER — FENTANYL CITRATE 0.05 MG/ML IJ SOLN
INTRAMUSCULAR | Status: AC
  Filled 2013-08-12: qty 2

## 2013-08-12 NOTE — H&P (Signed)
ELECTROPHYSIOLOGY CONSULT NOTE   Patient ID: Steven Everett, MRN: [id number], DOB/AGE: 1935/11/22 78 y.o.  Admit date: (Not on file)  Date of Consult: 07/27/2013  Primary Physician: Steven Gasser, MD  Primary Cardiologist: Steven Everett  Chief Complaint: ?ICD  HPI  Steven Everett is a 78 y.o. male seen for consideration of an ICD.  He was identified to cardiomyopathy January 2014 when he presented for evaluation of an asymptomatic murmur. His ejection fraction was noted to be 35%. There is mild aortic stenosis. There is mild RV enlargement. Transesophageal echo demonstrated inferior wall motion abnormality he underwent catheterization April 2014 demonstrating tandem LAD lesions 70-80%, and RCA lesions 60-70%. Medical therapy was recommended.  Repeat echo August 2014 demonstrates an ejection fraction of 30-35% with a bicuspid aortic valve and mild aortic stenosis. This has persisted despite therapy with beta blockers and ACE inhibitors.  He has class II symptoms. He is short of breath climbing a flight of stairs.  He has had recent episodes of transient lightheadedness lasting seconds. These are not positional. They're unassociated with palpitations.  He denies edema nocturnal dyspnea orthopnea.  Past Medical History   Diagnosis  Date   .  Hemorrhoids, internal    .  Diverticulosis of colon    .  Barrett's esophagus    .  Cirrhosis of liver      w/o mention of alcohol   .  Cancer      lung, lower lobe   .  Seborrheic dermatitis    .  Hyperlipidemia    .  Hypothyroidism    .  Hypertension    .  GERD (gastroesophageal reflux disease)    .  Diabetes mellitus      type 2   .  Rheumatic fever    .  Aortic stenosis    Surgical History:  Past Surgical History   Procedure  Laterality  Date   .  Right middle and lower lobectomy   1998     Dr Edwyna Shell- for lung cancer by Dr Gershon Crane   .  Neck surgery   6-00     Dr Jordan Likes   .  Appendectomy     .  Back cervical     .  Right hip replacement       Dr  Charlann Boxer   .  Foot surgery     .  Cardiac catheterization     .  Bilat cat surgery   8-11     Dr Pecola Leisure   .  Colonoscopy     .  Tee without cardioversion  N/A  02/14/2013     Procedure: TRANSESOPHAGEAL ECHOCARDIOGRAM (TEE); Surgeon: Lewayne Bunting, MD; Location: [facility] ENDOSCOPY; Service: Cardiovascular; Laterality: N/A;   Home Meds:  Prior to Admission medications   Medication  Sig  Start Date  End Date  Taking?  Authorizing Provider   acetic acid (VOSOL) 2 % otic solution  Place 4 drops into both ears 3 (three) times daily.  06/27/13   Yes  Agapito Games, MD   aspirin EC 81 MG tablet  Take 1 tablet (81 mg total) by mouth daily.  01/26/13   Yes  Lewayne Bunting, MD   carvedilol (COREG) 6.25 MG tablet  Take 1 tablet (6.25 mg total) by mouth 2 (two) times daily.  04/06/13   Yes  Lewayne Bunting, MD   fish oil-omega-3 fatty acids 1000 MG capsule  Take two tablets daily    Yes  Historical Provider, MD  levothyroxine (SYNTHROID, LEVOTHROID) 112 MCG tablet  TAKE 1 TABLET ONCE DAILY  04/25/13   Yes  Agapito Games, MD   lisinopril (PRINIVIL,ZESTRIL) 5 MG tablet  Take 1 tablet (5 mg total) by mouth daily.  03/28/13   Yes  Lewayne Bunting, MD   predniSONE (STERAPRED UNI-PAK) 5 MG TABS tablet  As directed  07/14/13   Yes  Historical Provider, MD   rosuvastatin (CRESTOR) 10 MG tablet  Take 1 tablet (10 mg total) by mouth at bedtime.  04/06/13  04/06/14  Yes  Lewayne Bunting, MD   Allergies:  Allergies   Allergen  Reactions   .  Atorvastatin      REACTION: joint aches    History    Social History   .  Marital Status:  Married     Spouse Name:  N/A     Number of Children:  1   .  Years of Education:  N/A    Occupational History   .      Social History Main Topics   .  Smoking status:  Former Smoker     Quit date:  02/14/1989   .  Smokeless tobacco:  Never Used   .  Alcohol Use:  No   .  Drug Use:  No   .  Sexual Activity:  Not on file    Other Topics  Concern   .  Not on file     Social History Narrative   .  No narrative on file    Family History   Problem  Relation  Age of Onset   .  Cancer  Mother      breast   .  Diabetes  Mother    .  Cancer  Father      Lung   .  Diabetes     ROS: Please see the history of present illness. All other systems reviewed and negative.  Physical Exam:  Blood pressure 124/72, pulse 64, height 5\' 10"  (1.778 m), weight 206 lb 6.4 oz (93.622 kg).  General: Well developed, well nourished male in no acute distress.  Head: Normocephalic, atraumatic, sclera non-icteric, no xanthomas, nares are without discharge.  EENT: normal  Lymph Nodes: none  Back: without scoliosis/kyphosis, no CVA tendersness  Neck: Negative for carotid bruits; upstrokes brisk. JVD not elevated.  Lungs: Clear bilaterally to auscultation without wheezes, rales, or rhonchi. Breathing is unlabored.  Heart: RRR with S1 S2. 2/6 systolic murmur , rubs, or gallops appreciated.  Abdomen: Soft, non-tender, non-distended with normoactive bowel sounds. No hepatomegaly. No rebound/guarding. No obvious abdominal masses.  Msk: Strength and tone appear normal for age.  Extremities: No clubbing or cyanosis. No edema. Distal pedal pulses are 2+ and equal bilaterally.  Skin: Warm and Dry  Neuro: Alert and oriented X 3. CN III-XII intact Grossly normal sensory and motor function .  Psych: Responds to questions appropriately with a normal affect.   Labs:  Cardiac Enzymes  No results found for this basename: CKTOTAL, CKMB, TROPONINI, in the last 72 hours  CBC  Lab Results   Component  Value  Date    WBC  6.1  03/02/2013    HGB  15.1  03/02/2013    HCT  45.2  03/02/2013    MCV  84.7  03/02/2013    PLT  165  03/02/2013   PROTIME:  No results found for this basename: LABPROT, INR, in the last 72 hours  Chemistry No results found  for this basename: NA, K, CL, CO2, BUN, CREATININE, CALCIUM, LABALBU, PROT, BILITOT, ALKPHOS, ALT, AST, GLUCOSE, in the last 168 hours  Lipids  Lab Results    Component  Value  Date    CHOL  103  05/24/2013    HDL  50  05/24/2013    LDLCALC  44  05/24/2013    TRIG  45  05/24/2013   BNP  No results found for this basename: probnp   Miscellaneous  No results found for this basename: DDIMER   Radiology/Studies:  No results found.  EKG: ECG demonstrates sinus rhythm at 79  Intervals 18/11/42  Nonspecific ST-T changes  Assessment and Plan:  Ischemic cardiomyopathy  The patient has persistent left ventricular dysfunction despite guidelines directed medical therapy. He has mild class II symptoms. It is appropriate to consider ICD implantation not withstanding his age and the absence of comorbidities, e.g. COPD renal insufficiency and atrial fibrillation. We have discussed the relative merits of transvenous bursa subcutaneous ICD. He would like to pursue the transvenous ICD we have discussed risks and benefits including but not limited to death perforation infection inappropriate shocks  Congestive heart failure-systolic and chronic class II  Continue guideline directed medical therapy. He is euvolemic  Episodic lightheadedness/syncope  The patient has episodes of lightheadedness that are recent onset. There is no suggestion from his ECG about what might be. They're non-positional and I worry about them being arrhythmic. We'll undertake a 48 hour Holter. This may impacted this is just a single versus dual-chamber device  He just told his wife that about a month or 2 he passed out on the floor in the morning without warning. This  Sherryl Manges      Please see Dr Koren Bound office note above for full details. Pt seen, examined, and the above is confirmed.  The patient has a mixed ischemic and nonschemic CM (EF 30%), NYHA Class II CHF, and CAD. At this time, he meets SCD-HeFT criteria for ICD implantation for primary prevention of sudden death. He has symptomatic bradycardia as documented on his holter with pauses which may explain his recent syncope.   Risks, benefits, alternatives to ICD implantation were discussed in detail with the patient today. The patient  understands that the risks include but are not limited to bleeding, infection, pneumothorax, perforation, tamponade, vascular damage, renal failure, MI, stroke, death, inappropriate shocks, and lead dislodgement and wishes to proceed.  We will therefore schedule device implantation at this time.

## 2013-08-12 NOTE — Progress Notes (Signed)
UR completed. Carrell Rahmani RN CCM Case Mgmt 

## 2013-08-12 NOTE — Discharge Summary (Signed)
ELECTROPHYSIOLOGY DISCHARGE SUMMARY    Patient ID: Steven Everett,  MRN: [id number], DOB/AGE: 03/10/1935 78 y.o.  Admit date: 08/12/2013 Discharge date: 08/12/2013  Primary Care Physician: Nani Gasser, MD  Primary Cardiologist / EP: Jens Som, MD / Johney Frame, MD  Primary Discharge Diagnosis:  1. 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 2. New York Heart Association class II heart failure  3. Syncope  4. Sick sinus syndrome  Secondary Discharge Diagnoses:  1. CAD 2. Bicuspid aortic valve with mild AS 3. HTN 4. Dyslipidemia 5. Hypothyroidism 6. DM 7. Barrett's esophagus and GERD  Procedures This Admission:  1. Dual chamber ICD implantation RA lead - Medtronic model [id number] (serial # [id number]) right atrial lead  RV lead - Medtronic, model [id number] (serial # [id number]) right ventricular defibrillator lead  Device - Medtronic Evera XT DR (serial # [id number]) ICD  History and Hospital Course:  Steven Everett is a 78 y.o. male with a mixed (predominantly nonischemic) CM (EF 30%), NYHA Class II CHF, and CAD. At this time, he meets SCD-HeFT criteria for ICD implantation for primary prevention of sudden death. He has had recent syncope of unclear etiology. In addition a holter monitor reveals sinus node dysfunction with long pauses. The patient has a narrow QRS and does not meet criteria for revascularization. The patient has been treated with an optimal medical regimen but continues to have a depressed ejection fraction and NYHA Class II CHF symptoms. The patient therefore presented yesterday for dual chamber ICD implantation. The patient tolerated this procedure well without any immediate complication. He remains hemodynamically stable and afebrile. His chest xray shows stable lead placement without pneumothorax. His device interrogation shows normal ICD function with stable lead parameters/measurements. His implant site is intact without  significant bleeding or hematoma. He has been given discharge instructions including wound care and activity restrictions. He will follow-up in 10 days for wound check. There were no changes made to his medications. He has been seen, examined and deemed stable for discharge today by Dr.  Myrtis Ser   . He was instructed not to drive for 6 months, until given clearance by Dr. Johney Frame.  Discharge Vitals: Blood pressure 134/91, pulse 80, temperature 97.6 F (36.4 C), temperature source Oral, resp. rate 18, height 5\' 10"  (1.778 m), weight 202 lb (91.627 kg), SpO2 100.00%.   Labs: Lab Results  Component Value Date   WBC 7.0 08/02/2013   HGB 14.9 08/02/2013   HCT 44.4 08/02/2013   MCV 85.5 08/02/2013   PLT 162.0 08/02/2013      Component Value Date/Time   NA 132* 08/02/2013 1156   K 3.9 08/02/2013 1156   CL 98 08/02/2013 1156   CO2 26 08/02/2013 1156   GLUCOSE 109* 08/02/2013 1156   GLUCOSE 101* 12/14/2006 1613   BUN 14 08/02/2013 1156   CREATININE 1.2 08/02/2013 1156   CREATININE 1.10 03/02/2013 1435   CALCIUM 9.5 08/02/2013 1156   GFRNONAA 58 12/03/2007 0914   GFRAA 70 12/03/2007 0914    Disposition:  The patient is being discharged in stable condition.  Follow-up:     Follow-up Information   Follow up with [facility] On 08/22/2013. (At 12:30 PM for wound check)    Specialty:  Electrophysiology   Contact information:   [address] [place] [address] [phone]      Follow up with Hillis Range, MD On 11/14/2013. (At 9:15 AM)    Specialty:  Cardiology   Contact  information:   [address] [place] [address] [phone]      Discharge Medications:    Medication List    ASK your doctor about these medications       acetic acid 2 % otic solution  Commonly known as:  VOSOL  Place 2 drops into both ears as needed.     aspirin EC 81 MG tablet  Take 81 mg by mouth daily.     carvedilol 6.25 MG tablet  Commonly known as:  COREG  Take  6.25 mg by mouth 2 (two) times daily.     fish oil-omega-3 fatty acids 1000 MG capsule  Take 2 g by mouth daily. Take two tablets daily     ketoconazole 2 % shampoo  Commonly known as:  NIZORAL  Apply 1 application topically 2 (two) times a week.     levothyroxine 112 MCG tablet  Commonly known as:  SYNTHROID, LEVOTHROID  Take 112 mcg by mouth daily before breakfast.     lisinopril 5 MG tablet  Commonly known as:  PRINIVIL,ZESTRIL  Take 5 mg by mouth daily.     predniSONE 5 MG Tabs tablet  Commonly known as:  STERAPRED UNI-PAK  Take 5 mg by mouth every other day. As directed     rosuvastatin 10 MG tablet  Commonly known as:  CRESTOR  Take 10 mg by mouth at bedtime.       Duration of Discharge Encounter: Greater than 30 minutes including physician time.  Limmie Patricia, PA-C 08/12/2013, 4:15 PM   Hillis Range MD

## 2013-08-12 NOTE — Op Note (Signed)
SURGEON:  Hillis Range, MD      PREPROCEDURE DIAGNOSES:   1. Mixed Nonischemic and Ischemic cardiomyopathy.   2. New York Heart Association class II, heart failure chronically.   3. Syncope  4. Sick sinus syndrome     POSTPROCEDURE DIAGNOSES:   1. Mixed Nonischemic and Ischemic cardiomyopathy.   2. New York Heart Association class II, heart failure chronically.   3. Syncope  4. Sick sinus syndrome     PROCEDURES:    1. ICD implantation.  3. Defibrillation threshold testing     INTRODUCTION:  Steven Everett is a 78 y.o. male with a mixed (predominantly nonischemi)c CM (EF 30%), NYHA Class II CHF, and CAD. At this time, he meets SCD-HeFT criteria for ICD implantation for primary prevention of sudden death.  He has had recent syncope of unclear etiology.  In addition a holter monitor reveals sinus node dysfunction with long pauses.  The patient has a narrow QRS and does not meet criteria for revascularization.  The patient has been treated with an optimal medical regimen but continues to have a depressed ejection fraction and NYHA Class II CHF symptoms.  The patient therefore  presents today for ICD implantation.      DESCRIPTION OF PROCEDURE:  Informed written consent was obtained and the patient was brought to the electrophysiology lab in the fasting state. The patient was adequately sedated with intravenous Versed, and fentanyl as outlined in the nursing report.  The patient's left chest was prepped and draped in the usual sterile fashion by the EP lab staff.  The skin overlying the left deltopectoral region was infiltrated with lidocaine for local analgesia.  A 5-cm incision was made over the left deltopectoral region.  A left subcutaneous defibrillator pocket was fashioned using a combination of sharp and blunt dissection.  Electrocautery was used to assure hemostasis.   RA/RV Lead Placement: The left axillary vein was cannulated with fluoroscopic visualization.  No contrast was required for  this endeavor.  Through the left axillary vein, a Medtronic model [id number]  (serial # [id number]  ) right atrial lead and a Medtronic, model [id number] (serial number [id number]) right ventricular defibrillator lead were advanced with fluoroscopic visualization into the right atrial appendage and right ventricular mid septal positions respectively.  Extensive R wave mapping was performed along the right ventricular apex.  In this local, R waves were very low (<3).  Finally R waves of 5-6 were found in the RV mid septal location.  Initial atrial lead P-waves measured 3.1 mV with an impedance of 708 ohms and a threshold of 1.2 volts at 0.5 milliseconds.  The right ventricular lead R-wave measured 5 mV with impedance of 494 ohms and a threshold of 0.75 volts at 0.5 milliseconds.   The leads were secured to the pectoralis  fascia using #2 silk suture over the suture sleeves.  The pocket then  irrigated with copious gentamicin solution.  The leads were then  connected to a Medtronic Evera XT DR (serial  Number [id number]) ICD.  The defibrillator was placed into the  pocket.  The pocket was then closed in 2 layers with 2.0 Vicryl suture  for the subcutaneous and subcuticular layers.  Steri-Strips and a  sterile dressing were then applied.   DFT Testing: Defibrillation Threshold testing was then performed. Ventricular fibrillation was induced with 50 Hz waveform applied.  Adequate sensing of ventricular  fibrillation was observed with no dropout with a programmed sensitivity of 1.13mV.  The patient was successfully defibrillated  to sinus rhythm with a single 10 joules shock delivered from the device with an impedance of 71 ohms in a duration of 8 seconds.  The patient remained in sinus rhythm thereafter.  The procedure was therefore considered completed.  There were no early apparhent complications.     CONCLUSIONS:   1. Mixed cardiomyopathy with chronic New York Heart Association class II heart failure.   2.  Successful ICD implantation.   3. DFT less than or equal to 10 joules.   4. No early apparent complications.

## 2013-08-12 NOTE — Progress Notes (Signed)
ICD Criteria  Current LVEF (within 6 months):30%  NYHA Functional Classification: Class II  Heart Failure History:  Yes, Duration of heart failure since onset is 3 to 9 months  Non-Ischemic Dilated Cardiomyopathy History:  No.  Atrial Fibrillation/Atrial Flutter:  No.  Ventricular Tachycardia History:  No.  Cardiac Arrest History:  No  History of Syndromes with Risk of Sudden Death:  No.  Previous ICD:  No.  Electrophysiology Study: No.  Anticoagulation Therapy:  Patient is NOT on anticoagulation therapy.   Beta Blocker Therapy:  Yes.   Ace Inhibitor/ARB Therapy:  Yes.   Recent syncope with sinus node dysfunction documented on holter monitor.

## 2013-08-12 NOTE — Progress Notes (Signed)
Doing well s/p ICD Would anticipate discharge to home in am Resume home medicines Routine wound care and follow-up

## 2013-08-13 ENCOUNTER — Ambulatory Visit (HOSPITAL_COMMUNITY): Payer: Medicare Other

## 2013-08-13 DIAGNOSIS — I5022 Chronic systolic (congestive) heart failure: Secondary | ICD-10-CM | POA: Diagnosis not present

## 2013-08-13 DIAGNOSIS — I2589 Other forms of chronic ischemic heart disease: Secondary | ICD-10-CM | POA: Diagnosis not present

## 2013-08-13 DIAGNOSIS — I509 Heart failure, unspecified: Secondary | ICD-10-CM | POA: Diagnosis not present

## 2013-08-13 DIAGNOSIS — Q231 Congenital insufficiency of aortic valve: Secondary | ICD-10-CM | POA: Diagnosis not present

## 2013-08-13 DIAGNOSIS — Z95818 Presence of other cardiac implants and grafts: Secondary | ICD-10-CM | POA: Diagnosis not present

## 2013-08-13 DIAGNOSIS — I495 Sick sinus syndrome: Secondary | ICD-10-CM | POA: Diagnosis not present

## 2013-08-13 DIAGNOSIS — I428 Other cardiomyopathies: Secondary | ICD-10-CM | POA: Diagnosis not present

## 2013-08-13 DIAGNOSIS — R0989 Other specified symptoms and signs involving the circulatory and respiratory systems: Secondary | ICD-10-CM | POA: Diagnosis not present

## 2013-08-13 MED ORDER — HYDROCODONE-ACETAMINOPHEN 5-325 MG PO TABS: 1.0000 | ORAL_TABLET | ORAL | Status: DC | PRN

## 2013-08-13 MED ORDER — ACETAMINOPHEN 325 MG PO TABS: 325.0000 mg | ORAL_TABLET | ORAL | Status: DC | PRN

## 2013-08-13 NOTE — Progress Notes (Signed)
[facility] ROUNDING NOTE  EP Cardiologist: Hillis Range MD Primary Cardiologist: Olga Millers MD  Subjective:   No complaints. Ready to go home.   Objective:   Temp:  [97.6 F (36.4 C)-98.2 F (36.8 C)] 98.2 F (36.8 C) (09/13 0555) Pulse Rate:  [68-80] 74 (09/13 0555) Resp:  [18] 18 (09/13 0555) BP: (130-155)/(68-91) 151/73 mmHg (09/13 0555) SpO2:  [99 %-100 %] 99 % (09/13 0555) Last BM Date: 08/12/13  Filed Weights   08/12/13 0556  Weight: 202 lb (91.627 kg)    Intake/Output Summary (Last 24 hours) at 08/13/13 0857 Last data filed at 08/13/13 1610  Gross per 24 hour  Intake    240 ml  Output   1050 ml  Net   -810 ml      Exam:  General: No acute distress.  HEENT: Conjunctiva and lids normal, oropharynx clear.  Lungs: Clear to auscultation, nonlabored.  Cardiac: No elevated JVP or bruits. RRR, no gallop or rub.   Abdomen: Normoactive bowel sounds, nontender, nondistended.  Extremities: No pitting edema, distal pulses full. Wearing left arm sling. Dressing to upper left chest with old blood noted, no active bleeding or hematoma.  Neuropsychiatric: Alert and oriented x3, affect appropriate.    Radiology: Dg Chest 2 View  08/13/2013   *RADIOLOGY REPORT*  Clinical Data: ICD placement  CHEST - 2 VIEW  Comparison: Preoperative radiograph 11/16/2012  Findings: Interval placement of a left subclavian approach cardiac rhythm maintenance device.  Leads project over the right atrium and right ventricle directed toward the interventricular septum. Stable postoperative changes in the right lung base with chronic elevation of the right hemidiaphragm and probable pleural thickening versus loculated pleural fluid.  No pneumothorax or other acute abnormality.  Stable cardiac and mediastinal contours. No acute osseous abnormality.  Atherosclerotic calcifications noted in the transverse aorta.  IMPRESSION: Interval placement of a left subclavian approach cardiac  rhythm maintenance device without evidence of immediate postoperative complication.   Original Report Authenticated By: Malachy Moan, M.D.     Medications:   Scheduled Medications: . aspirin EC  81 mg Oral Daily  . carvedilol  6.25 mg Oral BID  . levothyroxine  112 mcg Oral QAC breakfast  . lisinopril  5 mg Oral Daily  . sodium chloride  3 mL Intravenous Q12H       PRN Medications:  sodium chloride, acetaminophen, HYDROcodone-acetaminophen, ondansetron (ZOFRAN) IV, sodium chloride   Assessment and Plan:   1. Mixed Nonischemic CM S/P Dual chamber ICD implantation: Medtronic Evera XT DR (serial Number [id number]) ICD. Doing well. No complaints of pain. Discharge instructions given verbally concerning activities. Written form to be given on dishcarge paperwork. Follow-appt made.  2. CAD: Continue medical management.  3. Hypertension: Well controlled.  4. Bicuspid AoV with mild AS: Continued medical management.    Home today.    Bettey Mare. Lyman Bishop NP [facility] 08/13/2013, 8:57 AM Patient seen and examined. I agree with the assessment and plan as detailed above. See also my additional thoughts below.   Pt is seen. I made decision that he can go home.  Willa Rough, MD, [facility] 08/13/2013 10:09 AM

## 2013-08-15 ENCOUNTER — Ambulatory Visit (INDEPENDENT_AMBULATORY_CARE_PROVIDER_SITE_OTHER): Payer: Medicare Other | Admitting: *Deleted

## 2013-08-15 DIAGNOSIS — I428 Other cardiomyopathies: Secondary | ICD-10-CM

## 2013-08-15 NOTE — Progress Notes (Signed)
The patient was seen today for concerns of swelling @ the site of ICD implanted 08/12/13.  The dressing was removed and there was a small amount of swelling noted.  There is old blood noted on the steri strips which remain intact.  The site was also checked by Dr. Johney Frame.  Follow up with wound check as scheduled 08/22/13.  Verbal instructions given to call if swelling increases or if the incision becomes red or hot and the patient develops chills or fever.

## 2013-08-22 ENCOUNTER — Ambulatory Visit (INDEPENDENT_AMBULATORY_CARE_PROVIDER_SITE_OTHER): Payer: Medicare Other | Admitting: *Deleted

## 2013-08-22 DIAGNOSIS — I428 Other cardiomyopathies: Secondary | ICD-10-CM

## 2013-08-22 DIAGNOSIS — I420 Dilated cardiomyopathy: Secondary | ICD-10-CM

## 2013-08-22 DIAGNOSIS — I5022 Chronic systolic (congestive) heart failure: Secondary | ICD-10-CM

## 2013-08-22 LAB — ICD DEVICE OBSERVATION
AL IMPEDENCE ICD: 551 Ohm
DEV-0020ICD: NEGATIVE
HV IMPEDENCE: 70 Ohm
RV LEAD IMPEDENCE ICD: 437 Ohm
RV LEAD THRESHOLD: 0.75 V

## 2013-08-22 NOTE — Progress Notes (Signed)
Wound check-ICD in office. 

## 2013-09-03 ENCOUNTER — Encounter: Payer: Self-pay | Admitting: Internal Medicine

## 2013-09-20 ENCOUNTER — Other Ambulatory Visit: Payer: Self-pay | Admitting: Family Medicine

## 2013-09-28 ENCOUNTER — Ambulatory Visit (INDEPENDENT_AMBULATORY_CARE_PROVIDER_SITE_OTHER): Payer: Medicare Other | Admitting: Gastroenterology

## 2013-09-28 ENCOUNTER — Encounter: Payer: Self-pay | Admitting: Gastroenterology

## 2013-09-28 VITALS — BP 120/70 | HR 72 | Ht 67.13 in | Wt 205.0 lb

## 2013-09-28 DIAGNOSIS — K219 Gastro-esophageal reflux disease without esophagitis: Secondary | ICD-10-CM | POA: Diagnosis not present

## 2013-09-28 NOTE — Patient Instructions (Signed)
Stop the alkaseltzer. Start nexium (OTC) one pill every morning 20-30 min before breakfast. Call here in 4-5 weeks to report on your response to the new medicine.

## 2013-09-28 NOTE — Progress Notes (Signed)
Review of pertinent gastrointestinal problems:  1. Cirrhosis, cryptogenic, likely longstanding fatty liver disease. Diagnosed in early 2008 with mildly abnormal transaminases, followed up by CT scan suggesting a cirrhotic liver with irregular contours. Last imaging January 2008, CT scan without any focal lesions. Hepatitis B and C were negative. ANA, AFP, AMA, antismooth muscle antibody all negative. Iron studies normal. TSH normal. Normal platelets. Normal coags. Normal albumin. No ascites.   EGD January 02, 2007 showed no varices, EGD 2012 found portal gastropathy, no varices  CT scan, January 2009, with IV and oral contrast showed normal liver, no masses (actually no signs of cirrhosis on the CT scan). Calcific gallstones were noted. [place] 05/2011 showed no liver lesions.  AFP 05/2011 was normal 2. Non-erosive GERD. EGD, February 2008, showed Barrett's without dysplasia. Next EGD scheduled for February 2009. EGD 01/2008 found irregular GE junction wihtout Barrett's on path. Recall at 3 years. EGD 05/2011 Christella Hartigan found short segment Barrett's change (IM on pathology without dysplasia), + portal gastropathy, no varices. 3. routine risk for colon cancer. Colonoscopy 2009 found no polyps. Recommended to have repeat examination at 10 year interval (would be 83 at that point)   HPI: This is a  very pleasant 78 year old man whom I last saw about 2 months ago.  Implanted ICD last month (cardiomyopathy EF 30-35%).    He can tell a big improvement.  Not dizzy or LH nearly as bad as before the ICD placed.  Drinks about a pot of coffee every AM, sometimes more after lunch.  Has always had that much.  He has had no dysphasia  He is most bothered by pyrosis in late afternoon.    He takes Careers information officer for heartburn"  Not sure if he takes PPI however.  Doesn't eat much late a night.   Review of systems: Pertinent positive and negative review of systems were noted in the above HPI section. Complete review  of systems was performed and was otherwise normal.    Past Medical History  Diagnosis Date  . Hemorrhoids, internal   . Diverticulosis of colon   . Barrett's esophagus   . Cirrhosis of liver     w/o mention of alcohol  . Cancer     lung, lower lobe  . Seborrheic dermatitis   . Hyperlipidemia   . Hypothyroidism   . Hypertension   . GERD (gastroesophageal reflux disease)   . Diabetes mellitus     type 2  . Rheumatic fever   . Aortic stenosis     Past Surgical History  Procedure Laterality Date  . Right middle and lower lobectomy  1998    Dr Edwyna Shell- for lung cancer by Dr Gershon Crane  . Neck surgery  6-00    Dr Jordan Likes  . Appendectomy    . Back cervical    . Right hip replacement      Dr Charlann Boxer  . Foot surgery    . Cardiac catheterization    . Bilat cat surgery  8-11    Dr Pecola Leisure  . Colonoscopy    . Tee without cardioversion N/A 02/14/2013    Procedure: TRANSESOPHAGEAL ECHOCARDIOGRAM (TEE);  Surgeon: Lewayne Bunting, MD;  Location: [facility] ENDOSCOPY;  Service: Cardiovascular;  Laterality: N/A;    Current Outpatient Prescriptions  Medication Sig Dispense Refill  . acetaminophen (TYLENOL) 325 MG tablet Take 1-2 tablets (325-650 mg total) by mouth every 4 (four) hours as needed.      Marland Kitchen acetic acid (VOSOL) 2 % otic solution Place 2 drops  into both ears as needed.      Marland Kitchen aspirin EC 81 MG tablet Take 81 mg by mouth daily.      . carvedilol (COREG) 6.25 MG tablet Take 6.25 mg by mouth 2 (two) times daily.      . fish oil-omega-3 fatty acids 1000 MG capsule Take 2 g by mouth daily. Take two tablets daily      . HYDROcodone-acetaminophen (NORCO/VICODIN) 5-325 MG per tablet Take 1-2 tablets by mouth every 4 (four) hours as needed.  30 tablet  0  . ketoconazole (NIZORAL) 2 % shampoo Apply 1 application topically 2 (two) times a week.      . levothyroxine (SYNTHROID, LEVOTHROID) 112 MCG tablet Take 112 mcg by mouth daily before breakfast.      . lisinopril (PRINIVIL,ZESTRIL) 5 MG tablet Take 5  mg by mouth daily.      . predniSONE (STERAPRED UNI-PAK) 5 MG TABS tablet Take 5 mg by mouth every other day. As directed      . rosuvastatin (CRESTOR) 10 MG tablet Take 10 mg by mouth at bedtime.       No current facility-administered medications for this visit.    Allergies as of 09/28/2013 - Review Complete 09/28/2013  Allergen Reaction Noted  . Atorvastatin  10/04/2007    Family History  Problem Relation Age of Onset  . Cancer Mother     breast  . Diabetes Mother   . Cancer Father     Lung  . Diabetes      History   Social History  . Marital Status: Married    Spouse Name: N/A    Number of Children: 1  . Years of Education: N/A   Occupational History  .     Social History Main Topics  . Smoking status: Former Smoker    Quit date: 02/14/1989  . Smokeless tobacco: Never Used  . Alcohol Use: No  . Drug Use: No  . Sexual Activity: Not on file   Other Topics Concern  . Not on file   Social History Narrative  . No narrative on file       Physical Exam: BP 120/70  Pulse 72  Ht 5' 7.13" (1.705 m)  Wt 205 lb (92.987 kg)  BMI 31.99 kg/m2 Constitutional: generally well-appearing Psychiatric: alert and oriented x3 Eyes: extraocular movements intact Mouth: oral pharynx moist, no lesions Neck: supple no lymphadenopathy Cardiovascular: heart regular rate and rhythm Lungs: clear to auscultation bilaterally Abdomen: soft, nontender, nondistended, no obvious ascites, no peritoneal signs, normal bowel sounds Extremities: no lower extremity edema bilaterally Skin: no lesions on visible extremities    Assessment and plan: 78 y.o. male with  pyrosis  He is taking Alka-Seltzer for his heartburn which is probably counterproductive. Recommended he start once daily over-the-counter proton pump inhibitor such as the Nexium he was previously taking. He does take 1 pill 20-30 minutes before his breakfast meal and he will call to report on his symptoms after that in  about 4 weeks' time. He does get quite a lot of coffee but he has been doing this for years and I think it would be hard for him to cut back however if medical therapy can't control his GERD then you have to consider cutting back on caffeine as well. He has no alarm symptoms I do not think he requires further Barrett's screening or colon polyp screening.

## 2013-10-21 ENCOUNTER — Other Ambulatory Visit: Payer: Self-pay | Admitting: Family Medicine

## 2013-11-14 ENCOUNTER — Ambulatory Visit (INDEPENDENT_AMBULATORY_CARE_PROVIDER_SITE_OTHER): Payer: Medicare Other | Admitting: Family Medicine

## 2013-11-14 ENCOUNTER — Encounter: Payer: Self-pay | Admitting: Family Medicine

## 2013-11-14 ENCOUNTER — Encounter: Payer: Medicare Other | Admitting: Internal Medicine

## 2013-11-14 ENCOUNTER — Ambulatory Visit (INDEPENDENT_AMBULATORY_CARE_PROVIDER_SITE_OTHER): Payer: Medicare Other

## 2013-11-14 DIAGNOSIS — Z23 Encounter for immunization: Secondary | ICD-10-CM | POA: Diagnosis not present

## 2013-11-14 DIAGNOSIS — R0989 Other specified symptoms and signs involving the circulatory and respiratory systems: Secondary | ICD-10-CM | POA: Diagnosis not present

## 2013-11-14 DIAGNOSIS — E039 Hypothyroidism, unspecified: Secondary | ICD-10-CM

## 2013-11-14 DIAGNOSIS — Z85118 Personal history of other malignant neoplasm of bronchus and lung: Secondary | ICD-10-CM

## 2013-11-14 DIAGNOSIS — Z09 Encounter for follow-up examination after completed treatment for conditions other than malignant neoplasm: Secondary | ICD-10-CM

## 2013-11-14 NOTE — Progress Notes (Signed)
CC: Steven Everett is a 78 y.o. male is here for 6 month f/u thyroid and needs a f/u chest xray   Subjective: HPI:  Followup lung cancer that was originally diagnosed in 1998 per his report. His oncology team instructed him to have an annual x-ray for the rest of his life. He denies any pulmonary complaints today. Denies cough, shortness of breath, wheezing, chest pain. Denies unintentional weight loss. He stays quite active at the [facility] exercising to-3 hours most days of the week  Followup hypothyroidism: Continues on levothyroxine 112 mcg on a daily basis denies anxiety, depression, weight gain or loss.  Denies skin or hair changes, constipation, diarrhea, nor fatigue.   Review Of Systems Outlined In HPI  Past Medical History  Diagnosis Date  . Hemorrhoids, internal   . Diverticulosis of colon   . Barrett's esophagus   . Cirrhosis of liver     w/o mention of alcohol  . Cancer     lung, lower lobe  . Seborrheic dermatitis   . Hyperlipidemia   . Hypothyroidism   . Hypertension   . GERD (gastroesophageal reflux disease)   . Diabetes mellitus     type 2  . Rheumatic fever   . Aortic stenosis      Family History  Problem Relation Age of Onset  . Cancer Mother     breast  . Diabetes Mother   . Cancer Father     Lung  . Diabetes       History  Substance Use Topics  . Smoking status: Former Smoker    Quit date: 02/14/1989  . Smokeless tobacco: Never Used  . Alcohol Use: No     Objective: Filed Vitals:   11/14/13 1452  BP: 113/70  Pulse: 75    General: Alert and Oriented, No Acute Distress HEENT: Pupils equal, round, reactive to light. Conjunctivae clear.  Moist mucous membranes pharynx unremarkable neck is supple without palpable thyromegaly Lungs: Comfortable work of breathing with mildly decreased lung sounds in the right lower posterior lung field, there is also some mild rails at this location, no wheezing or rhonchi Cardiac: Regular rate and rhythm. Normal  S1/S2.  No murmurs, rubs, nor gallops.   Mental Status: No depression, anxiety, nor agitation. Skin: Warm and dry.  Assessment & Plan: Steven Everett was seen today for 6 month f/u thyroid and needs a f/u chest xray.  Diagnoses and associated orders for this visit:  Malignant neoplasm of lower lobe, bronchus, or lung, right - DG Chest 2 View; Future  HYPOTHYROIDISM - TSH    History of right lung cancer: Clinically stable per his request repeating annual chest x-ray Hypothyroidism: Clinically controlled repeat TSH, continue levothyroxine pending TSH today  Return if symptoms worsen or fail to improve.

## 2013-11-15 LAB — TSH: TSH: 1.759 u[IU]/mL (ref 0.350–4.500)

## 2013-11-21 ENCOUNTER — Other Ambulatory Visit: Payer: Self-pay | Admitting: Family Medicine

## 2013-11-30 ENCOUNTER — Encounter: Payer: Medicare Other | Admitting: Internal Medicine

## 2013-12-14 ENCOUNTER — Encounter: Payer: Self-pay | Admitting: Internal Medicine

## 2013-12-14 ENCOUNTER — Ambulatory Visit (INDEPENDENT_AMBULATORY_CARE_PROVIDER_SITE_OTHER): Payer: Medicare Other | Admitting: Internal Medicine

## 2013-12-14 VITALS — BP 140/82 | HR 80 | Ht 69.0 in | Wt 208.0 lb

## 2013-12-14 DIAGNOSIS — I420 Dilated cardiomyopathy: Secondary | ICD-10-CM

## 2013-12-14 DIAGNOSIS — I4891 Unspecified atrial fibrillation: Secondary | ICD-10-CM

## 2013-12-14 DIAGNOSIS — I5022 Chronic systolic (congestive) heart failure: Secondary | ICD-10-CM

## 2013-12-14 DIAGNOSIS — I428 Other cardiomyopathies: Secondary | ICD-10-CM

## 2013-12-14 DIAGNOSIS — I495 Sick sinus syndrome: Secondary | ICD-10-CM

## 2013-12-14 DIAGNOSIS — I2510 Atherosclerotic heart disease of native coronary artery without angina pectoris: Secondary | ICD-10-CM | POA: Diagnosis not present

## 2013-12-14 DIAGNOSIS — I480 Paroxysmal atrial fibrillation: Secondary | ICD-10-CM | POA: Insufficient documentation

## 2013-12-14 HISTORY — DX: Paroxysmal atrial fibrillation: I48.0

## 2013-12-14 MED ORDER — RIVAROXABAN 20 MG PO TABS: 20.0000 mg | ORAL_TABLET | Freq: Every day | ORAL | Status: DC

## 2013-12-14 NOTE — Patient Instructions (Addendum)
Your physician wants you to follow-up in: 9 months with Dr Vallery Ridge will receive a reminder letter in the mail two months in advance. If you don't receive a letter, please call our office to schedule the follow-up appointment.    Remote monitoring is used to monitor your Pacemaker or ICD from home. This monitoring reduces the number of office visits required to check your device to one time per year. It allows [place] to keep an eye on the functioning of your device to ensure it is working properly. You are scheduled for a device check from home on 03/16/14. You may send your transmission at any time that day. If you have a wireless device, the transmission will be sent automatically. After your physician reviews your transmission, you will receive a postcard with your next transmission date.   Your physician has recommended you make the following change in your medication:  1) Start  Xarelto 20mg  daily

## 2013-12-14 NOTE — Progress Notes (Signed)
PCP:  Beatrice Lecher, MD Primary Cardiologist:  Dr Stanford Breed  The patient presents today for routine electrophysiology followup.  Since having his ICD implanted, the patient reports doing very well.  Today, he denies symptoms of palpitations, chest pain, shortness of breath, orthopnea, PND, lower extremity edema, dizziness, presyncope, syncope, or neurologic sequela.  The patient feels that he is tolerating medications without difficulties and is otherwise without complaint today.   Past Medical History  Diagnosis Date  . Hemorrhoids, internal   . Diverticulosis of colon   . Barrett's esophagus   . Cirrhosis of liver     w/o mention of alcohol  . Cancer     lung, lower lobe  . Seborrheic dermatitis   . Hyperlipidemia   . Hypothyroidism   . Hypertension   . GERD (gastroesophageal reflux disease)   . Diabetes mellitus     type 2  . Rheumatic fever   . Aortic stenosis   . Paroxysmal atrial fibrillation 12/14/13    detected on ICD interrogation  . Ischemic cardiomyopathy   . Chronic systolic dysfunction of left ventricle   . Sinus node dysfunction    Past Surgical History  Procedure Laterality Date  . Right middle and lower lobectomy  1998    Dr Arlyce Dice- for lung cancer by Dr Rollene Fare  . Neck surgery  6-00    Dr Annette Stable  . Appendectomy    . Back cervical    . Right hip replacement      Dr Alvan Dame  . Foot surgery    . Cardiac catheterization    . Bilat cat surgery  8-11    Dr Ayesha Rumpf  . Colonoscopy    . Tee without cardioversion N/A 02/14/2013    Procedure: TRANSESOPHAGEAL ECHOCARDIOGRAM (TEE);  Surgeon: Lelon Perla, MD;  Location: [facility] ENDOSCOPY;  Service: Cardiovascular;  Laterality: N/A;  . Cardiac pacemaker placement    . Cardiac defibrillator placement  08/12/13    Medtronic Evera XT DR implanted by [name] for primary prevention of sudden death    Current Outpatient Prescriptions  Medication Sig Dispense Refill  . aspirin EC 81 MG tablet Take 81 mg by mouth  daily.      . carvedilol (COREG) 6.25 MG tablet Take 6.25 mg by mouth 2 (two) times daily.      . fish oil-omega-3 fatty acids 1000 MG capsule Take two tablets daily      . levothyroxine (SYNTHROID, LEVOTHROID) 112 MCG tablet TAKE 1 TABLET DAILY.  30 tablet  3  . lisinopril (PRINIVIL,ZESTRIL) 5 MG tablet Take 5 mg by mouth daily.      . predniSONE (STERAPRED UNI-PAK) 5 MG TABS tablet Take 5 mg by mouth every other day. As directed      . rosuvastatin (CRESTOR) 10 MG tablet Take 10 mg by mouth at bedtime.      . Rivaroxaban (XARELTO) 20 MG TABS tablet Take 1 tablet (20 mg total) by mouth daily with supper.  30 tablet  11   No current facility-administered medications for this visit.    Allergies  Allergen Reactions  . Atorvastatin     REACTION: joint aches    History   Social History  . Marital Status: Married    Spouse Name: N/A    Number of Children: 1  . Years of Education: N/A   Occupational History  .     Social History Main Topics  . Smoking status: Former Smoker    Quit date: 02/14/1989  . Smokeless  tobacco: Never Used  . Alcohol Use: No  . Drug Use: No  . Sexual Activity: Not on file   Other Topics Concern  . Not on file   Social History Narrative  . No narrative on file    Family History  Problem Relation Age of Onset  . Cancer Mother     breast  . Diabetes Mother   . Cancer Father     Lung  . Diabetes      ROS-  All systems are reviewed and are negative except as outlined in the HPI above  Physical Exam: Filed Vitals:   12/14/13 1210  BP: 140/82  Pulse: 80  Height: 5\' 9"  (1.753 m)  Weight: 208 lb (94.348 kg)    GEN- The patient is well appearing, alert and oriented x 3 today.   Head- normocephalic, atraumatic Eyes-  Sclera clear, conjunctiva pink Ears- hearing intact Oropharynx- clear Neck- supple, no JVP Lymph- no cervical lymphadenopathy Lungs- Clear to ausculation bilaterally, normal work of breathing Heart- Regular rate and rhythm,  no murmurs, rubs or gallops, PMI not laterally displaced GI- soft, NT, ND, + BS Extremities- no clubbing, cyanosis, or edema MS- no significant deformity or atrophy Skin- no rash or lesion Psych- euthymic mood, full affect Neuro- strength and sensation are intact  ICD interrogation today is reviewed and normal ICD pocket is examined.  There is a tiny fresh scratch over the mid pocket from where the patient was scratching--> I have advised strongly that he not do that  Assessment and Plan:  1. The patient has a mixed (predominantly nonischemi) CM (EF 30%), NYHA Class II CHF, and CAD.  He had syncope with documented sinus pauses.  He has done very well since his ICD was interrogated. Normal ICD function See Claudia Desanctis Art report No changes today carelink I will enroll in the [facility] clinic with Alvis Lemmings for optivol management long term  2. Sick sinus syndrome As above  3. HTN Stable No change required today  4. afib Newly diagnosed on ICD interrogation Chads2vasc score is at least 5.  I would therefore recommend anticoagulation long term. Today, I discussed coumadin and novel anticoagulants including pradaxa, xarelto, and eliquis today as indicated for risk reduction in stroke and systemic emboli with nonvalvular atrial fibrillation.  Risks, benefits, and alternatives to each of these drugs were discussed at length today.  She would prefer xarelto.  I will therefore start xarelto 20mg  daily today  Carelink every 3 months I will see in 9 months Follow-up with Dr Stanford Breed as scheduled

## 2013-12-26 ENCOUNTER — Telehealth: Payer: Self-pay | Admitting: *Deleted

## 2013-12-26 NOTE — Telephone Encounter (Signed)
[employer] rx approved xarelto through 12/22/2014 PA # [id number] Patient id # [id number]

## 2014-01-02 NOTE — Addendum Note (Signed)
Addended by: Fernande Boyden on: 01/02/2014 10:03 AM   Modules accepted: Level of Service, SmartSet

## 2014-01-02 NOTE — Telephone Encounter (Signed)
This encounter was created in error - please disregard.

## 2014-01-13 ENCOUNTER — Encounter: Payer: Self-pay | Admitting: Family Medicine

## 2014-01-13 ENCOUNTER — Ambulatory Visit (INDEPENDENT_AMBULATORY_CARE_PROVIDER_SITE_OTHER): Payer: Medicare Other | Admitting: Family Medicine

## 2014-01-13 VITALS — BP 102/61 | HR 82 | Wt 207.0 lb

## 2014-01-13 DIAGNOSIS — N401 Enlarged prostate with lower urinary tract symptoms: Secondary | ICD-10-CM | POA: Diagnosis not present

## 2014-01-13 DIAGNOSIS — N138 Other obstructive and reflux uropathy: Secondary | ICD-10-CM

## 2014-01-13 DIAGNOSIS — R39198 Other difficulties with micturition: Secondary | ICD-10-CM | POA: Diagnosis not present

## 2014-01-13 LAB — POCT URINALYSIS DIPSTICK
Bilirubin, UA: NEGATIVE
Blood, UA: NEGATIVE
Glucose, UA: NEGATIVE
Ketones, UA: NEGATIVE
Leukocytes, UA: NEGATIVE
NITRITE UA: NEGATIVE
PH UA: 5.5
PROTEIN UA: NEGATIVE
Spec Grav, UA: 1.025
UROBILINOGEN UA: 1

## 2014-01-13 MED ORDER — TAMSULOSIN HCL 0.4 MG PO CAPS: 0.4000 mg | ORAL_CAPSULE | Freq: Every day | ORAL | Status: DC

## 2014-01-13 NOTE — Progress Notes (Signed)
CC: Steven Everett is a 79 y.o. male is here for weak stream of urine   Subjective: HPI:  Patient complains of weak urinary stream and urinary hesitancy for the past one to 2 weeks. Symptoms have been mild for the past months however moderate in severity on a daily basis all hours of the day for the past one to 2 weeks. Nothing has made it better or worse. Is present all hours of the day he does not wake at night to urinate at all. He has a history of urinary retention following hip surgery requiring catheterization many years ago.  Denies dysuria, frequency, nor change in color or odor or consistency of urine. Denies pelvic pain fevers, chills, flank pain, nor blood in urine. No interventions as of yet   Review Of Systems Outlined In HPI  Past Medical History  Diagnosis Date  . Hemorrhoids, internal   . Diverticulosis of colon   . Barrett's esophagus   . Cirrhosis of liver     w/o mention of alcohol  . Cancer     lung, lower lobe  . Seborrheic dermatitis   . Hyperlipidemia   . Hypothyroidism   . Hypertension   . GERD (gastroesophageal reflux disease)   . Diabetes mellitus     type 2  . Rheumatic fever   . Aortic stenosis   . Paroxysmal atrial fibrillation 12/14/13    detected on ICD interrogation  . Ischemic cardiomyopathy   . Chronic systolic dysfunction of left ventricle   . Sinus node dysfunction     Past Surgical History  Procedure Laterality Date  . Right middle and lower lobectomy  1998    Dr Arlyce Dice- for lung cancer by Dr Rollene Fare  . Neck surgery  6-00    Dr Annette Stable  . Appendectomy    . Back cervical    . Right hip replacement      Dr Alvan Dame  . Foot surgery    . Cardiac catheterization    . Bilat cat surgery  8-11    Dr Ayesha Rumpf  . Colonoscopy    . Tee without cardioversion N/A 02/14/2013    Procedure: TRANSESOPHAGEAL ECHOCARDIOGRAM (TEE);  Surgeon: Lelon Perla, MD;  Location: [facility] ENDOSCOPY;  Service: Cardiovascular;  Laterality: N/A;  . Cardiac pacemaker placement     . Cardiac defibrillator placement  08/12/13    Medtronic Evera XT DR implanted by [name] for primary prevention of sudden death   Family History  Problem Relation Age of Onset  . Cancer Mother     breast  . Diabetes Mother   . Cancer Father     Lung  . Diabetes      History   Social History  . Marital Status: Married    Spouse Name: N/A    Number of Children: 1  . Years of Education: N/A   Occupational History  .     Social History Main Topics  . Smoking status: Former Smoker    Quit date: 02/14/1989  . Smokeless tobacco: Never Used  . Alcohol Use: No  . Drug Use: No  . Sexual Activity: Not on file   Other Topics Concern  . Not on file   Social History Narrative  . No narrative on file     Objective: BP 102/61  Pulse 82  Wt 207 lb (93.895 kg)  Vital signs reviewed. General: Alert and Oriented, No Acute Distress HEENT: Pupils equal, round, reactive to light. Conjunctivae clear.  External ears unremarkable.  Moist mucous membranes. Lungs: Clear and comfortable work of breathing, speaking in full sentences without accessory muscle use. Cardiac: Regular rate and rhythm.  Extremities: No peripheral edema.  Strong peripheral pulses.  Mental Status: No depression, anxiety, nor agitation. Logical though process. Skin: Warm and dry. Assessment & Plan: Steven Everett was seen today for weak stream of urine.  Diagnoses and associated orders for this visit:  Decreased urine stream - Urinalysis Dipstick - PSA, Medicare - tamsulosin (FLOMAX) 0.4 MG CAPS capsule; Take 1 capsule (0.4 mg total) by mouth daily.  BENIGN PROSTATIC HYPERTROPHY, WITH URINARY OBSTRUCTION - PSA, Medicare - tamsulosin (FLOMAX) 0.4 MG CAPS capsule; Take 1 capsule (0.4 mg total) by mouth daily.  Other Orders - Cancel: Urine Culture    BPH: Urinalysis is without abnormality, start Flomax we will check a PSA today as well. Return as needed in the next one to 2 weeks if symptoms are not  significantly improved.   Return if symptoms worsen or fail to improve.

## 2014-01-14 LAB — PSA, MEDICARE: PSA: 1.6 ng/mL (ref <=4.00)

## 2014-01-16 ENCOUNTER — Encounter: Payer: Medicare Other | Admitting: *Deleted

## 2014-01-20 ENCOUNTER — Telehealth: Payer: Self-pay | Admitting: *Deleted

## 2014-01-20 ENCOUNTER — Telehealth: Payer: Self-pay | Admitting: Internal Medicine

## 2014-01-20 NOTE — Telephone Encounter (Signed)
Spoke with patient and explained the importance of his staying on blood thinners.  He is concerned because his skins tears easily and it is at times hard to stop the bleeding.  I have explained to him that it is easier to deal with the bleeding skin than to have a CVA

## 2014-01-20 NOTE — Telephone Encounter (Signed)
Pt called and states he received a bill for a flu shot that was administered 12/15. Pt states he didn't have a flu shot at that visit and that he received his flu shot in sept at [employer]. From what I gather the pt is receiving a bill from his insurance because he received two flu shots within the season. I told him I would forward this to our office manager and meanwhile he could contact his insurance company to let them know what was going on so they could notate his account until further action is taken

## 2014-01-20 NOTE — Telephone Encounter (Signed)
New problem   Pt need to speak to nurse concerning his blood thinner. He feels like he doesn't need it. Please call pt.

## 2014-01-23 ENCOUNTER — Ambulatory Visit (INDEPENDENT_AMBULATORY_CARE_PROVIDER_SITE_OTHER): Payer: Medicare Other | Admitting: *Deleted

## 2014-01-23 DIAGNOSIS — Z95810 Presence of automatic (implantable) cardiac defibrillator: Secondary | ICD-10-CM

## 2014-01-23 DIAGNOSIS — I5022 Chronic systolic (congestive) heart failure: Secondary | ICD-10-CM

## 2014-01-23 NOTE — Progress Notes (Signed)
EPIC Encounter for ICM Monitoring  Patient Name: Steven Everett is a 79 y.o. male Date: 01/23/2014 Primary Care Physican: Beatrice Lecher, MD Primary Cardiologist: Stanford Breed Electrophysiologist: Allred Dry Weight: 205 lbs       In the past month, have you:  1. Gained more than 2 pounds in a day or more than 5 pounds in a week? no  2. Had changes in your medications (with verification of current medications)? no  3. Had more shortness of breath than is usual for you? no  4. Limited your activity because of shortness of breath? no  5. Not been able to sleep because of shortness of breath? no  6. Had increased swelling in your feet or ankles? no  7. Had symptoms of dehydration (dizziness, dry mouth, increased thirst, decreased urine output) no  8. Had changes in sodium restriction? no  9. Been compliant with medication? Yes   ICM trend:   Follow-up plan: ICM clinic phone appointment: 02/23/14  Copy of note sent to patient's primary care physician, primary cardiologist, and device following physician.  Alvis Lemmings, RN, BSN 01/23/2014 3:55 PM

## 2014-02-03 NOTE — Telephone Encounter (Signed)
I have sent an email to [employer] in billing to verify why the Insurance denied his claim for the flu shot.  Once I receive notification I will let you know her response.

## 2014-02-06 NOTE — Telephone Encounter (Signed)
Called and spoke with Steven Everett today in reference to his bill dated 11/14/13.  I received information from [place] in billing that the bill was paid in full, the remaining balance was $13.97 and that was for his coinsurance which was billed to [facility].  [facility] has not received payment yet.

## 2014-02-20 ENCOUNTER — Other Ambulatory Visit: Payer: Self-pay | Admitting: Family Medicine

## 2014-02-23 ENCOUNTER — Encounter: Payer: Medicare Other | Admitting: *Deleted

## 2014-02-27 ENCOUNTER — Encounter: Payer: Self-pay | Admitting: *Deleted

## 2014-02-28 ENCOUNTER — Other Ambulatory Visit: Payer: Self-pay | Admitting: Cardiology

## 2014-03-13 ENCOUNTER — Encounter: Payer: Medicare Other | Admitting: *Deleted

## 2014-04-04 ENCOUNTER — Encounter: Payer: Self-pay | Admitting: Gastroenterology

## 2014-04-05 ENCOUNTER — Other Ambulatory Visit: Payer: Self-pay | Admitting: Cardiology

## 2014-04-07 ENCOUNTER — Encounter: Payer: Self-pay | Admitting: Gastroenterology

## 2014-04-13 ENCOUNTER — Other Ambulatory Visit: Payer: Self-pay | Admitting: Family Medicine

## 2014-04-14 ENCOUNTER — Other Ambulatory Visit: Payer: Self-pay | Admitting: Cardiology

## 2014-04-17 ENCOUNTER — Other Ambulatory Visit: Payer: Self-pay | Admitting: Family Medicine

## 2014-04-27 ENCOUNTER — Other Ambulatory Visit: Payer: Self-pay | Admitting: *Deleted

## 2014-04-27 ENCOUNTER — Other Ambulatory Visit: Payer: Self-pay | Admitting: Cardiology

## 2014-04-27 MED ORDER — ROSUVASTATIN CALCIUM 10 MG PO TABS: ORAL_TABLET | ORAL | Status: DC

## 2014-04-27 MED ORDER — CARVEDILOL 6.25 MG PO TABS
6.2500 mg | ORAL_TABLET | Freq: Two times a day (BID) | ORAL | Status: DC
Start: 2014-04-27 — End: 2014-06-15

## 2014-05-17 ENCOUNTER — Encounter: Payer: Self-pay | Admitting: Cardiology

## 2014-05-17 ENCOUNTER — Ambulatory Visit (INDEPENDENT_AMBULATORY_CARE_PROVIDER_SITE_OTHER): Payer: Medicare Other | Admitting: Cardiology

## 2014-05-17 ENCOUNTER — Encounter: Payer: Self-pay | Admitting: *Deleted

## 2014-05-17 VITALS — BP 124/78 | HR 82 | Ht 69.0 in | Wt 204.1 lb

## 2014-05-17 DIAGNOSIS — I359 Nonrheumatic aortic valve disorder, unspecified: Secondary | ICD-10-CM | POA: Diagnosis not present

## 2014-05-17 DIAGNOSIS — I350 Nonrheumatic aortic (valve) stenosis: Secondary | ICD-10-CM

## 2014-05-17 DIAGNOSIS — I420 Dilated cardiomyopathy: Secondary | ICD-10-CM

## 2014-05-17 DIAGNOSIS — I4891 Unspecified atrial fibrillation: Secondary | ICD-10-CM | POA: Diagnosis not present

## 2014-05-17 DIAGNOSIS — I498 Other specified cardiac arrhythmias: Secondary | ICD-10-CM

## 2014-05-17 DIAGNOSIS — R001 Bradycardia, unspecified: Secondary | ICD-10-CM

## 2014-05-17 DIAGNOSIS — E785 Hyperlipidemia, unspecified: Secondary | ICD-10-CM | POA: Diagnosis not present

## 2014-05-17 DIAGNOSIS — I428 Other cardiomyopathies: Secondary | ICD-10-CM

## 2014-05-17 DIAGNOSIS — I2510 Atherosclerotic heart disease of native coronary artery without angina pectoris: Secondary | ICD-10-CM | POA: Diagnosis not present

## 2014-05-17 NOTE — Assessment & Plan Note (Signed)
Still sounds less than severe on examination. Repeat echocardiogram in August. We can consider a dobutamine echocardiogram if there is any question about severity.

## 2014-05-17 NOTE — Assessment & Plan Note (Signed)
Continue statin. 

## 2014-05-17 NOTE — Progress Notes (Signed)
HPI: Pleasant male for fu of AS. Previously seen for asymptomatic murmur. Echocardiogram in January of 2014 showed an ejection fraction of 35%. There was grade 1 diastolic dysfunction. There was mild aortic stenosis by gradient but moderate based on visual appearance. There was trace aortic insufficiency. There was mild left atrial enlargement. RV function mildly reduced. Transesophageal echocardiogram in March of 2014 showed an ejection fraction of 30-35% and akinesis of the inferior and proximal septal myocardium. There was moderate aortic stenosis (AVA by planimetry 1.2-1.4 cm2) and mild mitral regurgitation. There was mild left atrial enlargement. Cardiac catheterization in April of 2014 showed a pulmonary catheter wedge pressure of 13, 30-40% left main, 70 followed by 80% mid LAD, 60-70% proximal right coronary artery and 70% mid. There was moderate LV dysfunction. There was mild aortic stenosis. I reviewed films with Dr Angelena Form and medical therapy felt indicated. We therefore planned to titrate meds and then repeat echo. Note previous ferritin and TSH normal. Echocardiogram repeated in August of 2014 and showed an ejection fraction of 30-35%, mild aortic stenosis with a mean gradient of 10 mm of mercury and bicuspid morphology, mild AI. Patient had ICD placed in September 2014. Patient seen in January of 2015 and noted to have intermittent atrial fibrillation on his device interrogation. He was started on xarelto. Since he was last seen, There is no dyspnea, chest pain, syncope or bleeding.   Current Outpatient Prescriptions  Medication Sig Dispense Refill  . aspirin EC 81 MG tablet Take 81 mg by mouth daily.      . carvedilol (COREG) 6.25 MG tablet Take 1 tablet (6.25 mg total) by mouth 2 (two) times daily with a meal.  60 tablet  1  . fish oil-omega-3 fatty acids 1000 MG capsule Take two tablets daily      . levothyroxine (SYNTHROID, LEVOTHROID) 112 MCG tablet TAKE 1 TABLET DAILY.  30  tablet  1  . lisinopril (PRINIVIL,ZESTRIL) 5 MG tablet TAKE 1 TABLET DAILY.  30 tablet  3  . Rivaroxaban (XARELTO) 20 MG TABS tablet Take 1 tablet (20 mg total) by mouth daily with supper.  30 tablet  11  . rosuvastatin (CRESTOR) 10 MG tablet TAKE ONE TABLET AT BEDTIME  30 tablet  1  . tamsulosin (FLOMAX) 0.4 MG CAPS capsule TAKE ONE CAPSULE EVERY DAY  30 capsule  0   No current facility-administered medications for this visit.     Past Medical History  Diagnosis Date  . Hemorrhoids, internal   . Diverticulosis of colon   . Barrett's esophagus   . Cirrhosis of liver     w/o mention of alcohol  . Cancer     lung, lower lobe  . Seborrheic dermatitis   . Hyperlipidemia   . Hypothyroidism   . Hypertension   . GERD (gastroesophageal reflux disease)   . Diabetes mellitus     type 2  . Rheumatic fever   . Aortic stenosis   . Paroxysmal atrial fibrillation 12/14/13    detected on ICD interrogation  . Ischemic cardiomyopathy   . Chronic systolic dysfunction of left ventricle   . Sinus node dysfunction     Past Surgical History  Procedure Laterality Date  . Right middle and lower lobectomy  1998    Dr Arlyce Dice- for lung cancer by Dr Rollene Fare  . Neck surgery  6-00    Dr Annette Stable  . Appendectomy    . Back cervical    . Right hip replacement  Dr Alvan Dame  . Foot surgery    . Cardiac catheterization    . Bilat cat surgery  8-11    Dr Ayesha Rumpf  . Colonoscopy    . Tee without cardioversion N/A 02/14/2013    Procedure: TRANSESOPHAGEAL ECHOCARDIOGRAM (TEE);  Surgeon: Lelon Perla, MD;  Location: [facility] ENDOSCOPY;  Service: Cardiovascular;  Laterality: N/A;  . Cardiac pacemaker placement    . Cardiac defibrillator placement  08/12/13    Medtronic Evera XT DR implanted by [name] for primary prevention of sudden death    History   Social History  . Marital Status: Married    Spouse Name: N/A    Number of Children: 1  . Years of Education: N/A   Occupational History  .     Social  History Main Topics  . Smoking status: Former Smoker    Quit date: 02/14/1989  . Smokeless tobacco: Never Used  . Alcohol Use: No  . Drug Use: No  . Sexual Activity: Not on file   Other Topics Concern  . Not on file   Social History Narrative  . No narrative on file    ROS: no fevers or chills, productive cough, hemoptysis, dysphasia, odynophagia, melena, hematochezia, dysuria, hematuria, rash, seizure activity, orthopnea, PND, pedal edema, claudication. Remaining systems are negative.  Physical Exam: Well-developed well-nourished in no acute distress.  Skin is warm and dry.  HEENT is normal.  Neck is supple.  Chest is clear to auscultation with normal expansion.  Cardiovascular exam is regular rate and rhythm.  Abdominal exam nontender or distended. No masses palpated. Extremities show no edema. neuro grossly intact  ECG Sinus rhythm at a rate of 82. Right bundle branch block.

## 2014-05-17 NOTE — Assessment & Plan Note (Signed)
Patient previously found to have paroxysmal atrial fibrillation after interrogating his device. Continue beta blocker and xarelto. Check hemoglobin and renal function.

## 2014-05-17 NOTE — Assessment & Plan Note (Signed)
Status post ICD. 

## 2014-05-17 NOTE — Assessment & Plan Note (Signed)
The patient has a mixed ischemic/nonischemic cardiomyopathy. Continue ACE inhibitor and beta blocker.

## 2014-05-17 NOTE — Assessment & Plan Note (Addendum)
Continue statin. Discontinue aspirin given need for anticoagulation.

## 2014-05-17 NOTE — Patient Instructions (Signed)
Your physician wants you to follow-up in: [place] will receive a reminder letter in the mail two months in advance. If you don't receive a letter, please call our office to schedule the follow-up appointment.   Your physician recommends that you HAVE LAB WORK TODAY  Your physician has requested that you have an echocardiogram. Echocardiography is a painless test that uses sound waves to create images of your heart. It provides your doctor with information about the size and shape of your heart and how well your heart's chambers and valves are working. This procedure takes approximately one hour. There are no restrictions for this procedure.

## 2014-05-18 LAB — CBC
HEMATOCRIT: 44.3 % (ref 39.0–52.0)
Hemoglobin: 14.4 g/dL (ref 13.0–17.0)
MCH: 27.5 pg (ref 26.0–34.0)
MCHC: 32.5 g/dL (ref 30.0–36.0)
MCV: 84.7 fL (ref 78.0–100.0)
PLATELETS: 151 10*3/uL (ref 150–400)
RBC: 5.23 MIL/uL (ref 4.22–5.81)
RDW: 13.8 % (ref 11.5–15.5)
WBC: 6.4 10*3/uL (ref 4.0–10.5)

## 2014-05-18 LAB — BASIC METABOLIC PANEL WITH GFR
BUN: 23 mg/dL (ref 6–23)
CALCIUM: 10 mg/dL (ref 8.4–10.5)
CHLORIDE: 101 meq/L (ref 96–112)
CO2: 25 meq/L (ref 19–32)
CREATININE: 1.32 mg/dL (ref 0.50–1.35)
GFR, Est African American: 60 mL/min
GFR, Est Non African American: 52 mL/min — ABNORMAL LOW
GLUCOSE: 110 mg/dL — AB (ref 70–99)
Potassium: 4.6 mEq/L (ref 3.5–5.3)
Sodium: 138 mEq/L (ref 135–145)

## 2014-05-19 ENCOUNTER — Other Ambulatory Visit: Payer: Self-pay | Admitting: Family Medicine

## 2014-05-29 ENCOUNTER — Other Ambulatory Visit: Payer: Self-pay | Admitting: Cardiology

## 2014-05-29 ENCOUNTER — Other Ambulatory Visit: Payer: Self-pay | Admitting: *Deleted

## 2014-05-30 ENCOUNTER — Encounter: Payer: Self-pay | Admitting: *Deleted

## 2014-05-31 ENCOUNTER — Ambulatory Visit: Payer: Medicare Other | Admitting: Cardiology

## 2014-06-12 ENCOUNTER — Ambulatory Visit (INDEPENDENT_AMBULATORY_CARE_PROVIDER_SITE_OTHER): Payer: Medicare Other | Admitting: Gastroenterology

## 2014-06-12 ENCOUNTER — Other Ambulatory Visit: Payer: Self-pay | Admitting: Family Medicine

## 2014-06-12 ENCOUNTER — Encounter: Payer: Self-pay | Admitting: Gastroenterology

## 2014-06-12 VITALS — BP 132/74 | HR 82 | Ht 70.0 in | Wt 204.0 lb

## 2014-06-12 DIAGNOSIS — K2270 Barrett's esophagus without dysplasia: Secondary | ICD-10-CM

## 2014-06-12 DIAGNOSIS — I2510 Atherosclerotic heart disease of native coronary artery without angina pectoris: Secondary | ICD-10-CM

## 2014-06-12 NOTE — Patient Instructions (Addendum)
If you have trouble swallowing or significant heartburn that lasts for more than a few days, please call. Otherwise you do not need surveillance endoscopy for Barrett's change anymore.

## 2014-06-12 NOTE — Progress Notes (Signed)
Review of pertinent gastrointestinal problems:  1. Cirrhosis, cryptogenic, likely longstanding fatty liver disease. Diagnosed in early 2008 with mildly abnormal transaminases, followed up by CT scan suggesting a cirrhotic liver with irregular contours. Last imaging January 2008, CT scan without any focal lesions. Hepatitis B and C were negative. ANA, AFP, AMA, antismooth muscle antibody all negative. Iron studies normal. TSH normal. Normal platelets. Normal coags. Normal albumin. No ascites.  EGD January 02, 2007 showed no varices, EGD 2012 found portal gastropathy, no varices  CT scan, January 2009, with IV and oral contrast showed normal liver, no masses (actually no signs of cirrhosis on the CT scan). Calcific gallstones were noted. [place] 05/2011 showed no liver lesions.  AFP 05/2011 was normal 2. Barrett's, Non-erosive GERD. EGD, February 2008, showed Barrett's without dysplasia. Next EGD scheduled for February 2009. EGD 01/2008 found irregular GE junction wihtout Barrett's on path. Recall at 3 years. EGD 05/2011 Ardis Hughs found short segment Barrett's change (IM on pathology without dysplasia), + portal gastropathy, no varices.  3. routine risk for colon cancer. Colonoscopy 2009 found no polyps. Recommended to have repeat examination at 10 year interval (would be 83 at that point)   HPI: This is a   very pleasant 79 year old man whom I last saw about a year ago. Last year Echo 2014 showed LV EF 30-35%. Had implanted defib placed 2014.  He really feels fine. He has no heartburn, he has no dysphasia. His weight has been overall stable.    Past Medical History  Diagnosis Date  . Hemorrhoids, internal   . Diverticulosis of colon   . Barrett's esophagus   . Cirrhosis of liver     w/o mention of alcohol  . Cancer     lung, lower lobe  . Seborrheic dermatitis   . Hyperlipidemia   . Hypothyroidism   . Hypertension   . GERD (gastroesophageal reflux disease)   . Diabetes mellitus     type 2  .  Rheumatic fever   . Aortic stenosis   . Paroxysmal atrial fibrillation 12/14/13    detected on ICD interrogation  . Ischemic cardiomyopathy   . Chronic systolic dysfunction of left ventricle   . Sinus node dysfunction     Past Surgical History  Procedure Laterality Date  . Right middle and lower lobectomy  1998    Dr Arlyce Dice- for lung cancer by Dr Rollene Fare  . Neck surgery  6-00    Dr Annette Stable  . Appendectomy    . Back cervical    . Right hip replacement      Dr Alvan Dame  . Foot surgery    . Cardiac catheterization    . Bilat cat surgery  8-11    Dr Ayesha Rumpf  . Colonoscopy    . Tee without cardioversion N/A 02/14/2013    Procedure: TRANSESOPHAGEAL ECHOCARDIOGRAM (TEE);  Surgeon: Lelon Perla, MD;  Location: [facility] ENDOSCOPY;  Service: Cardiovascular;  Laterality: N/A;  . Cardiac pacemaker placement    . Cardiac defibrillator placement  08/12/13    Medtronic Evera XT DR implanted by [name] for primary prevention of sudden death    Current Outpatient Prescriptions  Medication Sig Dispense Refill  . carvedilol (COREG) 6.25 MG tablet Take 1 tablet (6.25 mg total) by mouth 2 (two) times daily with a meal.  60 tablet  1  . CRESTOR 10 MG tablet TAKE ONE TABLET AT BEDTIME  30 tablet  5  . levothyroxine (SYNTHROID, LEVOTHROID) 112 MCG tablet TAKE ONE TABLET DAILY  30 tablet  2  . lisinopril (PRINIVIL,ZESTRIL) 5 MG tablet TAKE 1 TABLET DAILY.  30 tablet  3  . Rivaroxaban (XARELTO) 20 MG TABS tablet Take 1 tablet (20 mg total) by mouth daily with supper.  30 tablet  11  . tamsulosin (FLOMAX) 0.4 MG CAPS capsule TAKE ONE CAPSULE EVERY DAY  30 capsule  0   No current facility-administered medications for this visit.    Allergies as of 06/12/2014 - Review Complete 06/12/2014  Allergen Reaction Noted  . Atorvastatin  10/04/2007    Family History  Problem Relation Age of Onset  . Cancer Mother     breast  . Diabetes Mother   . Cancer Father     Lung  . Diabetes      History   Social  History  . Marital Status: Married    Spouse Name: N/A    Number of Children: 1  . Years of Education: N/A   Occupational History  .     Social History Main Topics  . Smoking status: Former Smoker    Quit date: 02/14/1989  . Smokeless tobacco: Never Used  . Alcohol Use: No  . Drug Use: No  . Sexual Activity: Not on file   Other Topics Concern  . Not on file   Social History Narrative  . No narrative on file      Physical Exam: BP 132/74  Pulse 82  Ht 5\' 10"  (1.778 m)  Wt 204 lb (92.534 kg)  BMI 29.27 kg/m2 Constitutional: generally well-appearing Psychiatric: alert and oriented x3 Abdomen: soft, nontender, nondistended, no obvious ascites, no peritoneal signs, normal bowel sounds     Assessment and plan: 79 y.o. male with history of short segment Barrett's without dysplasia  He is 29 with implanted defibrillator, pacemaker for mixed cardiomyopathy with an ejection fraction of 30-35% based on echocardiogram last year. Barrett's surveillance is no longer a clinical concern for him and we discussed stopping surveillance.  He agrees. He has no symptoms that are concerning but he does such as dysphasia, heartburn, he knows to contact me.

## 2014-06-15 ENCOUNTER — Other Ambulatory Visit: Payer: Self-pay | Admitting: Cardiology

## 2014-06-26 ENCOUNTER — Telehealth: Payer: Self-pay | Admitting: *Deleted

## 2014-06-26 ENCOUNTER — Encounter: Payer: Self-pay | Admitting: *Deleted

## 2014-06-26 NOTE — Telephone Encounter (Signed)
Patient was followed in the [facility] clinic in February. He was due for a remote check in March, but no transmission received. I spoke with his wife at the time and they were having trouble with the phone lines ([employer]). They were attempting to contact the phone company. I rescheduled his transmission for late April and no remote ever received. Butch Penny mailed the patient a delinquent letter on 05/30/14. No appointments were made, no remotes received, and no calls from the patient. He is scheduled to come for an echo on Wednesday 8/5 at 7:30 am. Spoke with Juanda Crumble in the device clinic and made him aware I was going to double book the 9:00 am slot on the device schedule that day so the patient could be checked. Charles in agreement.

## 2014-07-04 ENCOUNTER — Other Ambulatory Visit: Payer: Self-pay | Admitting: Cardiology

## 2014-07-05 ENCOUNTER — Other Ambulatory Visit (HOSPITAL_COMMUNITY): Payer: Medicare Other

## 2014-07-10 ENCOUNTER — Other Ambulatory Visit (HOSPITAL_COMMUNITY): Payer: Self-pay | Admitting: Cardiology

## 2014-07-10 ENCOUNTER — Encounter: Payer: Self-pay | Admitting: Internal Medicine

## 2014-07-10 ENCOUNTER — Ambulatory Visit (INDEPENDENT_AMBULATORY_CARE_PROVIDER_SITE_OTHER): Payer: Medicare Other | Admitting: *Deleted

## 2014-07-10 ENCOUNTER — Ambulatory Visit (HOSPITAL_COMMUNITY): Payer: Medicare Other | Attending: Cardiology | Admitting: Radiology

## 2014-07-10 DIAGNOSIS — I428 Other cardiomyopathies: Secondary | ICD-10-CM

## 2014-07-10 DIAGNOSIS — I420 Dilated cardiomyopathy: Secondary | ICD-10-CM

## 2014-07-10 DIAGNOSIS — I359 Nonrheumatic aortic valve disorder, unspecified: Secondary | ICD-10-CM | POA: Diagnosis not present

## 2014-07-10 DIAGNOSIS — I5022 Chronic systolic (congestive) heart failure: Secondary | ICD-10-CM

## 2014-07-10 DIAGNOSIS — I2589 Other forms of chronic ischemic heart disease: Secondary | ICD-10-CM | POA: Diagnosis not present

## 2014-07-10 DIAGNOSIS — I4891 Unspecified atrial fibrillation: Secondary | ICD-10-CM

## 2014-07-10 LAB — MDC_IDC_ENUM_SESS_TYPE_INCLINIC
Battery Voltage: 3.02 V
Brady Statistic AP VP Percent: 0.19 %
Brady Statistic AS VP Percent: 0.99 %
Brady Statistic RA Percent Paced: 3.06 %
Brady Statistic RV Percent Paced: 1.18 %
Date Time Interrogation Session: 20150810144857
HighPow Impedance: 228 Ohm
HighPow Impedance: 71 Ohm
Lead Channel Impedance Value: 494 Ohm
Lead Channel Pacing Threshold Amplitude: 1 V
Lead Channel Pacing Threshold Pulse Width: 0.4 ms
Lead Channel Sensing Intrinsic Amplitude: 2.375 mV
Lead Channel Sensing Intrinsic Amplitude: 6.375 mV
Lead Channel Sensing Intrinsic Amplitude: 6.625 mV
Lead Channel Setting Pacing Amplitude: 2 V
Lead Channel Setting Pacing Amplitude: 2.5 V
Lead Channel Setting Pacing Pulse Width: 0.4 ms
Lead Channel Setting Sensing Sensitivity: 0.3 mV
MDC IDC MSMT BATTERY REMAINING LONGEVITY: 126 mo
MDC IDC MSMT LEADCHNL RA IMPEDANCE VALUE: 494 Ohm
MDC IDC MSMT LEADCHNL RA SENSING INTR AMPL: 2.125 mV
MDC IDC MSMT LEADCHNL RV PACING THRESHOLD AMPLITUDE: 1 V
MDC IDC MSMT LEADCHNL RV PACING THRESHOLD PULSEWIDTH: 0.4 ms
MDC IDC SET ZONE DETECTION INTERVAL: 300 ms
MDC IDC STAT BRADY AP VS PERCENT: 2.87 %
MDC IDC STAT BRADY AS VS PERCENT: 95.95 %
Zone Setting Detection Interval: 340 ms
Zone Setting Detection Interval: 350 ms
Zone Setting Detection Interval: 360 ms

## 2014-07-10 NOTE — Progress Notes (Signed)
Echocardiogram performed.  

## 2014-07-10 NOTE — Progress Notes (Signed)
ICD check in clinic. Normal device function. Thresholds and sensing consistent with previous device measurements. Impedance trends stable over time. No evidence of any ventricular arrhythmias. 827 mode switches with a total burden of 1.7%, + xarelto. Histogram distribution appropriate for patient and level of activity. No changes made this session. Device programmed at appropriate safety margins. Device programmed to optimize intrinsic conduction. Estimated longevity 10.4 years.  Patient education completed including shock plan. Alert tones/vibration demonstrated for patient.  ROV in November with the device clinic.

## 2014-07-13 ENCOUNTER — Other Ambulatory Visit: Payer: Self-pay | Admitting: Family Medicine

## 2014-07-17 ENCOUNTER — Telehealth: Payer: Self-pay | Admitting: Internal Medicine

## 2014-07-17 NOTE — Telephone Encounter (Signed)
Pt is on Xarelto. Pt is in the donut hole and his recent prescription for Xarelto was 115.31 for 34 tablets. Steven Everett, pharmacist states there is no patient assistance for Xarelto patients in the donut hole.  Pt has found medication on internet and wants to know if OK with [name]. Internet company is [employer]--manufacturer [employer]-pt can get prescription for $2.85 for 85 tablets.  Pt advised I am sending this to [name] for review and recommendations.

## 2014-07-17 NOTE — Telephone Encounter (Signed)
New message      Pt want to know if we thinks internet presc for xeralto is safe.  It is cheaper

## 2014-07-17 NOTE — Telephone Encounter (Signed)
I do not think that this is a reliable source for his xarelto. Claiborne Billings, please contact Justice Deeds to see if we can obtain samples for the patient to help him through the next few months.

## 2014-07-19 ENCOUNTER — Telehealth: Payer: Self-pay | Admitting: Cardiology

## 2014-07-19 NOTE — Telephone Encounter (Signed)
Please call,wants to bring some applications for medicine on Friday.If noy ay home,call on his cell [phone].

## 2014-07-19 NOTE — Telephone Encounter (Signed)
Spoke with pt, he will bring the paperwork to me on Friday at [facility] office

## 2014-07-20 ENCOUNTER — Encounter: Payer: Self-pay | Admitting: Cardiology

## 2014-07-20 NOTE — Telephone Encounter (Signed)
Please call,has a question about a form he is filling out to get some Xarelto.

## 2014-07-20 NOTE — Telephone Encounter (Signed)
This encounter was created in error - please disregard.

## 2014-07-21 ENCOUNTER — Other Ambulatory Visit: Payer: Self-pay | Admitting: *Deleted

## 2014-07-21 DIAGNOSIS — I4891 Unspecified atrial fibrillation: Secondary | ICD-10-CM

## 2014-07-21 MED ORDER — ROSUVASTATIN CALCIUM 10 MG PO TABS: 10.0000 mg | ORAL_TABLET | Freq: Every day | ORAL | Status: DC

## 2014-07-21 MED ORDER — RIVAROXABAN 20 MG PO TABS: 20.0000 mg | ORAL_TABLET | Freq: Every day | ORAL | Status: DC

## 2014-08-03 ENCOUNTER — Encounter: Payer: Self-pay | Admitting: Cardiology

## 2014-08-03 ENCOUNTER — Telehealth: Payer: Self-pay | Admitting: Cardiology

## 2014-08-03 NOTE — Telephone Encounter (Signed)
This encounter was created in error - please disregard.

## 2014-08-03 NOTE — Telephone Encounter (Signed)
Pt is calling in regards to a paper received in that mail about a prescription savings program. Please call  Thanks

## 2014-08-03 NOTE — Telephone Encounter (Signed)
Pt is calling in wanting to speak to [name] regards to a letter he received in the mail about a prescription savings program. He seems unsure on what it means or how to use it. Please call  Thanks

## 2014-08-08 ENCOUNTER — Telehealth: Payer: Self-pay | Admitting: Cardiology

## 2014-08-08 DIAGNOSIS — Z23 Encounter for immunization: Secondary | ICD-10-CM | POA: Diagnosis not present

## 2014-08-08 NOTE — Telephone Encounter (Signed)
Returned call to patient. He states she made a mistake when filling out crestor assistance application through [employer] - was supposed to check Medicare Part D (but checked private insurance). He states that [place] faxed back the application so that PATIENT could correct form and re-submit it. The correction in on page 3.   He also filled out application for Xarelto assistance and has not heard back yet. RN informed him they are different companies and is unsure of their process for going through the assistance program requests.

## 2014-08-08 NOTE — Telephone Encounter (Signed)
Please call,concerning his medical assistant application he filled out for his Crestor.They said he put down something wrong.

## 2014-08-10 ENCOUNTER — Telehealth: Payer: Self-pay | Admitting: Cardiology

## 2014-08-10 ENCOUNTER — Telehealth: Payer: Self-pay | Admitting: Internal Medicine

## 2014-08-10 NOTE — Telephone Encounter (Signed)
New message      Patient cannot afford xeralto.  Want something cheaper

## 2014-08-10 NOTE — Telephone Encounter (Signed)
Returned call to patient he stated he needed to correct [place] application form.Advised Dr.Crenshaw's nurse out of office this week will send message to Fredia Beets RN.

## 2014-08-10 NOTE — Telephone Encounter (Signed)
Steven Everett can you take a look at this, you may already have the paper work

## 2014-08-10 NOTE — Telephone Encounter (Signed)
Malachy Mood can you call this pt. Or do you want me to look into this

## 2014-08-10 NOTE — Telephone Encounter (Signed)
Pt called in stating that he sent in an application from [employer] for Crestor and he said that he made an error on page 3 and would like to correct it. He stated that he will be in the area tomorrow and would like to pick up that paperwork. He said that he sent in a similar application for Xarelto and have not heard anything from them as far as financial assistance to receive this medication. Please call pt.  Thanks

## 2014-08-11 NOTE — Telephone Encounter (Signed)
Spoke with the patient and let him know I will put samples out front for him as he is in the doughnut whole

## 2014-08-14 NOTE — Telephone Encounter (Signed)
Forms refaxed

## 2014-08-15 ENCOUNTER — Other Ambulatory Visit: Payer: Self-pay | Admitting: Family Medicine

## 2014-08-19 DIAGNOSIS — S41009A Unspecified open wound of unspecified shoulder, initial encounter: Secondary | ICD-10-CM | POA: Diagnosis not present

## 2014-08-19 DIAGNOSIS — I10 Essential (primary) hypertension: Secondary | ICD-10-CM | POA: Diagnosis not present

## 2014-08-19 DIAGNOSIS — S41109A Unspecified open wound of unspecified upper arm, initial encounter: Secondary | ICD-10-CM | POA: Diagnosis not present

## 2014-08-19 DIAGNOSIS — E079 Disorder of thyroid, unspecified: Secondary | ICD-10-CM | POA: Diagnosis not present

## 2014-08-19 DIAGNOSIS — Z7901 Long term (current) use of anticoagulants: Secondary | ICD-10-CM | POA: Diagnosis not present

## 2014-08-19 DIAGNOSIS — Z79899 Other long term (current) drug therapy: Secondary | ICD-10-CM | POA: Diagnosis not present

## 2014-08-19 DIAGNOSIS — Z950 Presence of cardiac pacemaker: Secondary | ICD-10-CM | POA: Diagnosis not present

## 2014-08-21 ENCOUNTER — Telehealth: Payer: Self-pay | Admitting: Cardiology

## 2014-08-21 ENCOUNTER — Encounter: Payer: Self-pay | Admitting: Cardiology

## 2014-08-21 NOTE — Telephone Encounter (Signed)
Pt called and said they still have not received the fax.

## 2014-08-21 NOTE — Telephone Encounter (Signed)
This encounter was created in error - please disregard.

## 2014-08-21 NOTE — Telephone Encounter (Signed)
Pt called in wanting to know if Debra fax off paperwork to [facility] [employer] yet. Please call   Thanks

## 2014-08-21 NOTE — Telephone Encounter (Signed)
Spoke with pt, aware has been re-faxed

## 2014-08-25 ENCOUNTER — Telehealth: Payer: Self-pay | Admitting: Cardiology

## 2014-08-25 NOTE — Telephone Encounter (Signed)
Spoke with pt, will need to fax crestor application to [employer] @ [phone]

## 2014-08-25 NOTE — Telephone Encounter (Signed)
Please call,wants to talk to you about his Crestor.

## 2014-08-28 NOTE — Telephone Encounter (Signed)
Application has been re faxed to the number provided.

## 2014-08-31 ENCOUNTER — Encounter: Payer: Self-pay | Admitting: Gastroenterology

## 2014-09-06 ENCOUNTER — Telehealth: Payer: Self-pay | Admitting: Internal Medicine

## 2014-09-06 NOTE — Telephone Encounter (Signed)
Patient called to get samples of xarelto placed a month supply of xarelto up front

## 2014-09-06 NOTE — Telephone Encounter (Deleted)
Error

## 2014-09-07 ENCOUNTER — Telehealth: Payer: Self-pay | Admitting: Cardiology

## 2014-09-07 MED ORDER — ROSUVASTATIN CALCIUM 10 MG PO TABS: 10.0000 mg | ORAL_TABLET | Freq: Every day | ORAL | Status: DC

## 2014-09-07 NOTE — Telephone Encounter (Signed)
Spoke with pt, have not heard crestor. Samples placed at the front desk for pick up

## 2014-09-07 NOTE — Telephone Encounter (Signed)
Please call,wants to know if you have heard anything from his Crestor? He says he is completely out of it.

## 2014-10-02 ENCOUNTER — Other Ambulatory Visit: Payer: Self-pay | Admitting: Cardiology

## 2014-10-04 ENCOUNTER — Telehealth: Payer: Self-pay

## 2014-10-04 NOTE — Telephone Encounter (Signed)
Patient called to get sample of xarelto placed a month supply of xarelto

## 2014-10-11 ENCOUNTER — Ambulatory Visit (INDEPENDENT_AMBULATORY_CARE_PROVIDER_SITE_OTHER): Payer: Medicare Other | Admitting: *Deleted

## 2014-10-11 DIAGNOSIS — I420 Dilated cardiomyopathy: Secondary | ICD-10-CM | POA: Diagnosis not present

## 2014-10-11 DIAGNOSIS — I495 Sick sinus syndrome: Secondary | ICD-10-CM | POA: Diagnosis not present

## 2014-10-11 DIAGNOSIS — R001 Bradycardia, unspecified: Secondary | ICD-10-CM | POA: Diagnosis not present

## 2014-10-11 DIAGNOSIS — I5022 Chronic systolic (congestive) heart failure: Secondary | ICD-10-CM | POA: Diagnosis not present

## 2014-10-11 LAB — MDC_IDC_ENUM_SESS_TYPE_INCLINIC
Battery Remaining Longevity: 123 mo
Battery Voltage: 3.01 V
Brady Statistic AS VS Percent: 97.52 %
Date Time Interrogation Session: 20151111105900
HighPow Impedance: 266 Ohm
HighPow Impedance: 74 Ohm
Lead Channel Impedance Value: 456 Ohm
Lead Channel Pacing Threshold Amplitude: 0.875 V
Lead Channel Pacing Threshold Pulse Width: 0.4 ms
Lead Channel Sensing Intrinsic Amplitude: 2 mV
Lead Channel Sensing Intrinsic Amplitude: 2.5 mV
Lead Channel Sensing Intrinsic Amplitude: 6 mV
Lead Channel Setting Pacing Amplitude: 2.5 V
Lead Channel Setting Pacing Pulse Width: 0.4 ms
Lead Channel Setting Sensing Sensitivity: 0.3 mV
MDC IDC MSMT LEADCHNL RA IMPEDANCE VALUE: 494 Ohm
MDC IDC MSMT LEADCHNL RA PACING THRESHOLD PULSEWIDTH: 0.4 ms
MDC IDC MSMT LEADCHNL RV PACING THRESHOLD AMPLITUDE: 0.875 V
MDC IDC MSMT LEADCHNL RV SENSING INTR AMPL: 5.75 mV
MDC IDC SET LEADCHNL RA PACING AMPLITUDE: 2 V
MDC IDC SET ZONE DETECTION INTERVAL: 340 ms
MDC IDC STAT BRADY AP VP PERCENT: 0.09 %
MDC IDC STAT BRADY AP VS PERCENT: 1.87 %
MDC IDC STAT BRADY AS VP PERCENT: 0.52 %
MDC IDC STAT BRADY RA PERCENT PACED: 1.97 %
MDC IDC STAT BRADY RV PERCENT PACED: 0.61 %
Zone Setting Detection Interval: 300 ms
Zone Setting Detection Interval: 350 ms
Zone Setting Detection Interval: 360 ms

## 2014-10-11 NOTE — Progress Notes (Addendum)
ICD check in clinic. Normal device function. Thresholds, sensing, impedances consistent with previous measurements. Device programmed to maximize longevity. 142 AT/AF episodes (1.0%)---max dur. 1 hr 55 mins, Max Avg A 359, Max Avg V 121 + Xarelto. No high ventricular rates noted. Device programmed at appropriate safety margins. Histogram distribution appropriate for patient activity level. Device programmed to optimize intrinsic conduction. Estimated longevity 10.2 years. Patient will follow up with JA on 1/28 @ 9:15am. Patient education completed about shock plan.

## 2014-10-12 ENCOUNTER — Other Ambulatory Visit: Payer: Self-pay | Admitting: Family Medicine

## 2014-10-13 NOTE — Telephone Encounter (Signed)
Left message for patient to call and schedule an appointment.

## 2014-10-24 ENCOUNTER — Telehealth: Payer: Self-pay | Admitting: Cardiology

## 2014-10-24 ENCOUNTER — Encounter: Payer: Self-pay | Admitting: Internal Medicine

## 2014-10-24 NOTE — Telephone Encounter (Signed)
Spoke to patient.  no samples available today, may call back next week. Patient verbalized understanding.

## 2014-10-24 NOTE — Telephone Encounter (Signed)
Pt would like some samples of Crestor please. If not at home please call-[phone].

## 2014-10-24 NOTE — Telephone Encounter (Signed)
Wrong doctor

## 2014-10-27 ENCOUNTER — Telehealth: Payer: Self-pay | Admitting: *Deleted

## 2014-10-27 NOTE — Telephone Encounter (Signed)
Samples of Xarelto 20mg   placed at front desk for patient

## 2014-10-31 ENCOUNTER — Telehealth: Payer: Self-pay | Admitting: Cardiology

## 2014-10-31 NOTE — Telephone Encounter (Signed)
Spoke with pt, aware samples placed at the front desk at the [address] location for patient to pick up

## 2014-10-31 NOTE — Telephone Encounter (Signed)
Pt would like some samples of Crestor please.

## 2014-11-09 ENCOUNTER — Encounter (HOSPITAL_COMMUNITY): Payer: Self-pay | Admitting: Internal Medicine

## 2014-11-13 ENCOUNTER — Encounter: Payer: Self-pay | Admitting: Family Medicine

## 2014-11-13 ENCOUNTER — Ambulatory Visit (INDEPENDENT_AMBULATORY_CARE_PROVIDER_SITE_OTHER): Payer: Medicare Other | Admitting: Family Medicine

## 2014-11-13 VITALS — BP 131/79 | HR 82 | Temp 98.0°F | Ht 70.0 in | Wt 187.0 lb

## 2014-11-13 DIAGNOSIS — E039 Hypothyroidism, unspecified: Secondary | ICD-10-CM

## 2014-11-13 DIAGNOSIS — R7301 Impaired fasting glucose: Secondary | ICD-10-CM | POA: Diagnosis not present

## 2014-11-13 DIAGNOSIS — N138 Other obstructive and reflux uropathy: Secondary | ICD-10-CM

## 2014-11-13 DIAGNOSIS — N401 Enlarged prostate with lower urinary tract symptoms: Secondary | ICD-10-CM | POA: Diagnosis not present

## 2014-11-13 DIAGNOSIS — C3431 Malignant neoplasm of lower lobe, right bronchus or lung: Secondary | ICD-10-CM

## 2014-11-13 DIAGNOSIS — I2510 Atherosclerotic heart disease of native coronary artery without angina pectoris: Secondary | ICD-10-CM | POA: Diagnosis not present

## 2014-11-13 DIAGNOSIS — E785 Hyperlipidemia, unspecified: Secondary | ICD-10-CM

## 2014-11-13 LAB — POCT GLYCOSYLATED HEMOGLOBIN (HGB A1C): Hemoglobin A1C: 5.7

## 2014-11-13 MED ORDER — FINASTERIDE 5 MG PO TABS: 5.0000 mg | ORAL_TABLET | Freq: Every day | ORAL | Status: DC

## 2014-11-13 MED ORDER — ROSUVASTATIN CALCIUM 10 MG PO TABS: 10.0000 mg | ORAL_TABLET | Freq: Every day | ORAL | Status: DC

## 2014-11-13 MED ORDER — TERAZOSIN HCL 1 MG PO CAPS: 1.0000 mg | ORAL_CAPSULE | Freq: Every day | ORAL | Status: DC

## 2014-11-13 NOTE — Progress Notes (Signed)
   Subjective:    Patient ID: Steven Everett, male    DOB: September 21, 1935, 79 y.o.   MRN: [id number]  HPI Hyperlipidemia - doing well on his statin. Samples would be helpful since he is at his Medicare gap and he is at the end of the year.  Barretts esophagus-he has decided not to have any further evaluation or endoscopy. Dr. Yates Decamp just want him to come back if he felt like he was having problems are becoming more symptomatic.  Cirrhosis of the liver without mention of alcohol-again they have opted not to continue surveillance at this time specially since he is asymptomatic. Has no fluid or ascites. We will check his liver enzymes at least once or twice per year.   Congestive dilated cardiomyopathy-doing well on current regimen. No chest pain, shortness of breath or swelling.  BPH-he did try the tamsulosin for about one month. He did not notice any improvement in his urinary stream. He does not have any nocturia. He would like to try something else if possible.  Review of Systems     Objective:   Physical Exam  Constitutional: He is oriented to person, place, and time. He appears well-developed and well-nourished.  HENT:  Head: Normocephalic and atraumatic.  Cardiovascular: Normal rate, regular rhythm and normal heart sounds.   3/6 SEM best heard at the right sternal border. This is small early S1 high-pitched sound to the murmur.  Pulmonary/Chest: Effort normal and breath sounds normal.  Neurological: He is alert and oriented to person, place, and time.  Skin: Skin is warm and dry.  Psychiatric: He has a normal mood and affect. His behavior is normal.          Assessment & Plan:  Hyperlipidemia - Doing well on Crestor 10-mg.  Samples provided.   BPH-we'll try combination therapy with terazosin to help with flow and finasteride to help shrink the prostate gland. Encouraged him to try for at least 6 weeks to see if he is noticing any improvement.  Coronary artery disease-due for  lipid and CMP. Lab slip provided. He will go when has a chance. Does have a cardiology follow-up in January.  Lung cancer in remission-due for  yearly chest x-ray. Will call with results.   Cirrhosis -Decided not to actively monitor this unless symptomatic.   Barretts esophagus-again has decided not to be proactive in monitoring this but plans to follow-up if he hasn't palms or complications with Dr. Ardis Hughs.

## 2014-11-13 NOTE — Addendum Note (Signed)
Addended by: Teddy Spike on: 11/13/2014 03:56 PM   Modules accepted: Orders, Medications

## 2014-11-13 NOTE — Addendum Note (Signed)
Addended by: Teddy Spike on: 11/13/2014 03:59 PM   Modules accepted: Orders

## 2014-11-13 NOTE — Addendum Note (Signed)
Addended by: Teddy Spike on: 11/13/2014 03:49 PM   Modules accepted: Orders

## 2014-11-13 NOTE — Assessment & Plan Note (Signed)
Due for cholesterol and lipid panel. Lab slip provided today.

## 2014-11-14 ENCOUNTER — Ambulatory Visit (INDEPENDENT_AMBULATORY_CARE_PROVIDER_SITE_OTHER): Payer: Medicare Other

## 2014-11-14 ENCOUNTER — Other Ambulatory Visit: Payer: Self-pay | Admitting: Family Medicine

## 2014-11-14 DIAGNOSIS — J984 Other disorders of lung: Secondary | ICD-10-CM | POA: Diagnosis not present

## 2014-11-14 DIAGNOSIS — C3491 Malignant neoplasm of unspecified part of right bronchus or lung: Secondary | ICD-10-CM

## 2014-11-14 DIAGNOSIS — C3431 Malignant neoplasm of lower lobe, right bronchus or lung: Secondary | ICD-10-CM

## 2014-11-14 DIAGNOSIS — C3490 Malignant neoplasm of unspecified part of unspecified bronchus or lung: Secondary | ICD-10-CM | POA: Diagnosis not present

## 2014-11-16 DIAGNOSIS — N401 Enlarged prostate with lower urinary tract symptoms: Secondary | ICD-10-CM | POA: Diagnosis not present

## 2014-11-16 DIAGNOSIS — E039 Hypothyroidism, unspecified: Secondary | ICD-10-CM | POA: Diagnosis not present

## 2014-11-16 DIAGNOSIS — E785 Hyperlipidemia, unspecified: Secondary | ICD-10-CM | POA: Diagnosis not present

## 2014-11-17 LAB — COMPLETE METABOLIC PANEL WITH GFR
ALK PHOS: 74 U/L (ref 39–117)
ALT: 34 U/L (ref 0–53)
AST: 30 U/L (ref 0–37)
Albumin: 4 g/dL (ref 3.5–5.2)
BILIRUBIN TOTAL: 0.8 mg/dL (ref 0.2–1.2)
BUN: 15 mg/dL (ref 6–23)
CO2: 28 mEq/L (ref 19–32)
CREATININE: 1.14 mg/dL (ref 0.50–1.35)
Calcium: 9.8 mg/dL (ref 8.4–10.5)
Chloride: 101 mEq/L (ref 96–112)
GFR, EST NON AFRICAN AMERICAN: 62 mL/min
GFR, Est African American: 71 mL/min
GLUCOSE: 94 mg/dL (ref 70–99)
Potassium: 4.4 mEq/L (ref 3.5–5.3)
Sodium: 138 mEq/L (ref 135–145)
Total Protein: 6.8 g/dL (ref 6.0–8.3)

## 2014-11-17 LAB — LIPID PANEL
CHOL/HDL RATIO: 2.2 ratio
Cholesterol: 104 mg/dL (ref 0–200)
HDL: 48 mg/dL (ref >39)
LDL Cholesterol: 48 mg/dL (ref 0–99)
Triglycerides: 41 mg/dL (ref <150)
VLDL: 8 mg/dL (ref 0–40)

## 2014-11-17 LAB — TSH: TSH: 1.94 u[IU]/mL (ref 0.350–4.500)

## 2014-11-17 LAB — PSA: PSA: 1.42 ng/mL (ref <=4.00)

## 2014-11-19 NOTE — Progress Notes (Signed)
Quick Note:  All labs are normal. ______ 

## 2014-11-21 ENCOUNTER — Telehealth: Payer: Self-pay | Admitting: *Deleted

## 2014-11-21 NOTE — Telephone Encounter (Signed)
Xarelto samples placed at the front desk for patient. 

## 2014-12-08 DIAGNOSIS — I4510 Unspecified right bundle-branch block: Secondary | ICD-10-CM | POA: Diagnosis not present

## 2014-12-08 DIAGNOSIS — I700 Atherosclerosis of aorta: Secondary | ICD-10-CM | POA: Diagnosis not present

## 2014-12-08 DIAGNOSIS — R05 Cough: Secondary | ICD-10-CM | POA: Diagnosis not present

## 2014-12-08 DIAGNOSIS — E079 Disorder of thyroid, unspecified: Secondary | ICD-10-CM | POA: Diagnosis not present

## 2014-12-08 DIAGNOSIS — R55 Syncope and collapse: Secondary | ICD-10-CM | POA: Diagnosis not present

## 2014-12-08 DIAGNOSIS — R404 Transient alteration of awareness: Secondary | ICD-10-CM | POA: Diagnosis not present

## 2014-12-08 DIAGNOSIS — I495 Sick sinus syndrome: Secondary | ICD-10-CM | POA: Diagnosis not present

## 2014-12-08 DIAGNOSIS — E785 Hyperlipidemia, unspecified: Secondary | ICD-10-CM | POA: Diagnosis not present

## 2014-12-08 DIAGNOSIS — Z79899 Other long term (current) drug therapy: Secondary | ICD-10-CM | POA: Diagnosis not present

## 2014-12-08 DIAGNOSIS — I350 Nonrheumatic aortic (valve) stenosis: Secondary | ICD-10-CM | POA: Diagnosis not present

## 2014-12-08 DIAGNOSIS — G9389 Other specified disorders of brain: Secondary | ICD-10-CM | POA: Diagnosis not present

## 2014-12-08 DIAGNOSIS — R531 Weakness: Secondary | ICD-10-CM | POA: Diagnosis not present

## 2014-12-08 DIAGNOSIS — R062 Wheezing: Secondary | ICD-10-CM | POA: Diagnosis not present

## 2014-12-08 DIAGNOSIS — Z7901 Long term (current) use of anticoagulants: Secondary | ICD-10-CM | POA: Diagnosis not present

## 2014-12-08 DIAGNOSIS — N401 Enlarged prostate with lower urinary tract symptoms: Secondary | ICD-10-CM | POA: Diagnosis not present

## 2014-12-08 DIAGNOSIS — R0602 Shortness of breath: Secondary | ICD-10-CM | POA: Diagnosis not present

## 2014-12-08 DIAGNOSIS — Z950 Presence of cardiac pacemaker: Secondary | ICD-10-CM | POA: Diagnosis not present

## 2014-12-08 DIAGNOSIS — I10 Essential (primary) hypertension: Secondary | ICD-10-CM | POA: Diagnosis not present

## 2014-12-08 DIAGNOSIS — R42 Dizziness and giddiness: Secondary | ICD-10-CM | POA: Diagnosis not present

## 2014-12-08 DIAGNOSIS — I5022 Chronic systolic (congestive) heart failure: Secondary | ICD-10-CM | POA: Diagnosis not present

## 2014-12-08 DIAGNOSIS — Z66 Do not resuscitate: Secondary | ICD-10-CM | POA: Diagnosis not present

## 2014-12-08 DIAGNOSIS — R9082 White matter disease, unspecified: Secondary | ICD-10-CM | POA: Diagnosis not present

## 2014-12-09 DIAGNOSIS — N401 Enlarged prostate with lower urinary tract symptoms: Secondary | ICD-10-CM | POA: Diagnosis not present

## 2014-12-09 DIAGNOSIS — R55 Syncope and collapse: Secondary | ICD-10-CM | POA: Diagnosis not present

## 2014-12-09 DIAGNOSIS — I5022 Chronic systolic (congestive) heart failure: Secondary | ICD-10-CM | POA: Diagnosis not present

## 2014-12-09 DIAGNOSIS — Z7901 Long term (current) use of anticoagulants: Secondary | ICD-10-CM | POA: Diagnosis not present

## 2014-12-09 DIAGNOSIS — R42 Dizziness and giddiness: Secondary | ICD-10-CM | POA: Diagnosis not present

## 2014-12-12 ENCOUNTER — Encounter (HOSPITAL_BASED_OUTPATIENT_CLINIC_OR_DEPARTMENT_OTHER): Payer: Medicare Other | Attending: General Surgery

## 2014-12-18 ENCOUNTER — Other Ambulatory Visit: Payer: Self-pay | Admitting: Cardiology

## 2014-12-18 ENCOUNTER — Other Ambulatory Visit: Payer: Self-pay | Admitting: Family Medicine

## 2014-12-20 ENCOUNTER — Telehealth: Payer: Self-pay

## 2014-12-20 NOTE — Telephone Encounter (Signed)
Patient called for samples of xarelto placed them up front

## 2014-12-26 ENCOUNTER — Encounter: Payer: Self-pay | Admitting: Family Medicine

## 2014-12-26 ENCOUNTER — Ambulatory Visit (INDEPENDENT_AMBULATORY_CARE_PROVIDER_SITE_OTHER): Payer: Medicare Other | Admitting: Family Medicine

## 2014-12-26 VITALS — BP 130/75 | HR 81 | Ht 70.0 in | Wt 183.0 lb

## 2014-12-26 DIAGNOSIS — N138 Other obstructive and reflux uropathy: Secondary | ICD-10-CM

## 2014-12-26 DIAGNOSIS — N401 Enlarged prostate with lower urinary tract symptoms: Secondary | ICD-10-CM

## 2014-12-26 DIAGNOSIS — I952 Hypotension due to drugs: Secondary | ICD-10-CM | POA: Diagnosis not present

## 2014-12-26 NOTE — Progress Notes (Signed)
   Subjective:    Patient ID: Steven Everett, male    DOB: 05/07/1935, 80 y.o.   MRN: [id number]  HPI  Here today for hospital follow-up. Patient went to the emergency department at [facility] location on 12/09/2014. He complained of lightheadedness and was found to be hypotensive. He denies any room spinning he said he just felt like he was going to pass out. We had actually added finasteride to his regimen for BPH. At the emergency department they felt that this was the cause of his dizziness. He did have a head CT which was negative. He decided on his own to stop the terazosin and finasteride. He has felt better since then. He did have a repeat echocardiogram all fair but unfortunately it was a very limited study. Left ventricle function appeared to be preserved. Review of Systems     Objective:   Physical Exam  Constitutional: He is oriented to person, place, and time. He appears well-developed and well-nourished.  HENT:  Head: Normocephalic and atraumatic.  Cardiovascular: Normal rate, regular rhythm and normal heart sounds.   Pulmonary/Chest: Effort normal and breath sounds normal.  Neurological: He is alert and oriented to person, place, and time.  Skin: Skin is warm and dry.  Psychiatric: He has a normal mood and affect. His behavior is normal.          Assessment & Plan:  Hypotension-resolved. Blood pressure looks great today. CT of head was completely normal per report from [facility].   Benign prostatic hypertrophy-he wants to discontinue these medications which I think is perfectly fine. If he feels like he is having more difficulty with urinating or it's getting worse then please let me know and we'll refer him to urology for further evaluation and treatment options.  Has lost 4 lbs but says he has been eating more healthy and has purposefullly been losing weight.   Pacemaker with defibrillator-due for check next week.

## 2014-12-28 ENCOUNTER — Encounter: Payer: Self-pay | Admitting: Internal Medicine

## 2014-12-28 ENCOUNTER — Ambulatory Visit (INDEPENDENT_AMBULATORY_CARE_PROVIDER_SITE_OTHER): Payer: Medicare Other | Admitting: Internal Medicine

## 2014-12-28 VITALS — BP 130/82 | HR 68 | Ht 70.5 in | Wt 182.8 lb

## 2014-12-28 DIAGNOSIS — I5022 Chronic systolic (congestive) heart failure: Secondary | ICD-10-CM | POA: Diagnosis not present

## 2014-12-28 DIAGNOSIS — I420 Dilated cardiomyopathy: Secondary | ICD-10-CM

## 2014-12-28 DIAGNOSIS — R001 Bradycardia, unspecified: Secondary | ICD-10-CM | POA: Diagnosis not present

## 2014-12-28 DIAGNOSIS — I495 Sick sinus syndrome: Secondary | ICD-10-CM | POA: Diagnosis not present

## 2014-12-28 DIAGNOSIS — I4891 Unspecified atrial fibrillation: Secondary | ICD-10-CM

## 2014-12-28 LAB — MDC_IDC_ENUM_SESS_TYPE_INCLINIC
Battery Remaining Longevity: 121 mo
Battery Voltage: 3.01 V
Brady Statistic AP VP Percent: 0.1 %
Brady Statistic AP VS Percent: 0.57 %
Brady Statistic AS VP Percent: 1.09 %
Brady Statistic AS VS Percent: 98.23 %
Brady Statistic RV Percent Paced: 1.2 %
HighPow Impedance: 228 Ohm
HighPow Impedance: 72 Ohm
Lead Channel Impedance Value: 494 Ohm
Lead Channel Pacing Threshold Amplitude: 1 V
Lead Channel Pacing Threshold Amplitude: 1.25 V
Lead Channel Pacing Threshold Pulse Width: 0.4 ms
Lead Channel Pacing Threshold Pulse Width: 0.4 ms
Lead Channel Sensing Intrinsic Amplitude: 2.625 mV
Lead Channel Sensing Intrinsic Amplitude: 5.875 mV
Lead Channel Setting Pacing Amplitude: 2 V
Lead Channel Setting Pacing Amplitude: 2.5 V
Lead Channel Setting Pacing Pulse Width: 0.4 ms
Lead Channel Setting Sensing Sensitivity: 0.3 mV
MDC IDC MSMT LEADCHNL RV IMPEDANCE VALUE: 437 Ohm
MDC IDC SESS DTM: 20160128094901
MDC IDC SET ZONE DETECTION INTERVAL: 360 ms
MDC IDC STAT BRADY RA PERCENT PACED: 0.68 %
Zone Setting Detection Interval: 300 ms
Zone Setting Detection Interval: 340 ms
Zone Setting Detection Interval: 350 ms

## 2014-12-28 NOTE — Patient Instructions (Signed)
Your physician wants you to follow-up in: 12 months with Dr Vallery Ridge will receive a reminder letter in the mail two months in advance. If you don't receive a letter, please call our office to schedule the follow-up appointment.  Remote monitoring is used to monitor your Pacemaker or ICD from home. This monitoring reduces the number of office visits required to check your device to one time per year. It allows [place] to keep an eye on the functioning of your device to ensure it is working properly. You are scheduled for a device check from home on 03/29/15. You may send your transmission at any time that day. If you have a wireless device, the transmission will be sent automatically. After your physician reviews your transmission, you will receive a postcard with your next transmission date.   Alvis Lemmings, RN will call you to set up ICM transmission

## 2014-12-28 NOTE — Progress Notes (Signed)
PCP:  Beatrice Lecher, MD Primary Cardiologist:  Dr Stanford Breed  The patient presents today for routine electrophysiology followup.  Since having his last visit, the patient reports doing very well.  He seems mostly asymptomatic with his AS.  He is toleratring anticoagulation for AF wtihout difficulty.  BP is controlled. Today, he denies symptoms of palpitations, chest pain, shortness of breath, orthopnea, PND, lower extremity edema, dizziness, presyncope, syncope, or neurologic sequela.  The patient feels that he is tolerating medications without difficulties and is otherwise without complaint today.   Past Medical History  Diagnosis Date  . Hemorrhoids, internal   . Diverticulosis of colon   . Barrett's esophagus   . Cirrhosis of liver     w/o mention of alcohol  . Cancer     lung, lower lobe  . Seborrheic dermatitis   . Hyperlipidemia   . Hypothyroidism   . Hypertension   . GERD (gastroesophageal reflux disease)   . Diabetes mellitus     type 2  . Rheumatic fever   . Aortic stenosis   . Paroxysmal atrial fibrillation 12/14/13    detected on ICD interrogation  . Ischemic cardiomyopathy   . Chronic systolic dysfunction of left ventricle   . Sinus node dysfunction    Past Surgical History  Procedure Laterality Date  . Right middle and lower lobectomy  1998    Dr Arlyce Dice- for lung cancer by Dr Rollene Fare  . Neck surgery  6-00    Dr Annette Stable  . Appendectomy    . Back cervical    . Right hip replacement      Dr Alvan Dame  . Foot surgery    . Cardiac catheterization    . Bilat cat surgery  8-11    Dr Ayesha Rumpf  . Colonoscopy    . Tee without cardioversion N/A 02/14/2013    Procedure: TRANSESOPHAGEAL ECHOCARDIOGRAM (TEE);  Surgeon: Lelon Perla, MD;  Location: [facility] ENDOSCOPY;  Service: Cardiovascular;  Laterality: N/A;  . Cardiac pacemaker placement    . Cardiac defibrillator placement  08/12/13    Medtronic Evera XT DR implanted by [name] for primary prevention of sudden death  .  Implantable cardioverter defibrillator implant N/A 08/12/2013    Procedure: IMPLANTABLE CARDIOVERTER DEFIBRILLATOR IMPLANT;  Surgeon: Coralyn Mark, MD;  Location: [facility] CATH LAB;  Service: Cardiovascular;  Laterality: N/A;    Current Outpatient Prescriptions  Medication Sig Dispense Refill  . carvedilol (COREG) 6.25 MG tablet TAKE ONE TABLET TWICE DAILY WITH MEALS (Patient taking differently: TAKE ONE TABLET BY MOUTH TWICE DAILY WITH MEALS) 60 tablet 5  . levothyroxine (SYNTHROID, LEVOTHROID) 112 MCG tablet TAKE ONE TABLET DAILY (Patient taking differently: TAKE ONE TABLET BY MOUTH DAILY) 30 tablet 3  . lisinopril (PRINIVIL,ZESTRIL) 5 MG tablet TAKE 1 TABLET DAILY. (Patient taking differently: TAKE 1 TABLET BY MOUTH DAILY.) 30 tablet 3  . rivaroxaban (XARELTO) 20 MG TABS tablet Take 1 tablet (20 mg total) by mouth daily with supper. 90 tablet 3  . rosuvastatin (CRESTOR) 10 MG tablet Take 1 tablet (10 mg total) by mouth daily. 28 tablet 0   No current facility-administered medications for this visit.    Allergies  Allergen Reactions  . Atorvastatin     REACTION: joint aches    History   Social History  . Marital Status: Widowed    Spouse Name: Hoyle Sauer    Number of Children: 1  . Years of Education: N/A   Occupational History  .     Social History  Main Topics  . Smoking status: Former Smoker    Quit date: 02/14/1989  . Smokeless tobacco: Never Used  . Alcohol Use: No  . Drug Use: No  . Sexual Activity: Not on file   Other Topics Concern  . Not on file   Social History Narrative    Family History  Problem Relation Age of Onset  . Cancer Mother     breast  . Diabetes Mother   . Cancer Father     Lung  . Diabetes      ROS-  All systems are reviewed and are negative except as outlined in the HPI above  Physical Exam: Filed Vitals:   12/28/14 0933  BP: 130/82  Pulse: 68  Height: 5' 10.5" (1.791 m)  Weight: 182 lb 12.8 oz (82.918 kg)    GEN- The patient is  well appearing, alert and oriented x 3 today.   Head- normocephalic, atraumatic Eyes-  Sclera clear, conjunctiva pink Ears- hearing intact Oropharynx- clear Neck- supple,  Lungs- Clear to ausculation bilaterally, normal work of breathing Heart- Regular rate and rhythm, no murmurs, rubs or gallops, PMI not laterally displaced GI- soft, NT, ND, + BS Extremities- no clubbing, cyanosis, or edema MS- no significant deformity or atrophy Skin- no rash or lesion Psych- euthymic mood, full affect Neuro- strength and sensation are intact  ICD interrogation today is reviewed and normal  Assessment and Plan:  1. The patient has a mixed (predominantly nonischemi) CM (EF 30%), NYHA Class II CHF, and CAD.    No ischemic symptoms No CHF symptoms today Normal ICD function See [facility] Art report No changes today carelink--> will send [place] adaptor to the patient and he is ready to begin remotes. I will again enroll in the [facility] clinic with Alvis Lemmings for optivol management long term  2. Sick sinus syndrome No further syncope or pauses  3. HTN Stable No change required today  4. afib Chads2vasc score is at least 5.  Continue xarelto.  Carelink every 3 months I will see in 12 months Follow-up with Dr Stanford Breed as scheduled

## 2015-01-05 DIAGNOSIS — M25552 Pain in left hip: Secondary | ICD-10-CM | POA: Diagnosis not present

## 2015-01-05 DIAGNOSIS — M7062 Trochanteric bursitis, left hip: Secondary | ICD-10-CM | POA: Diagnosis not present

## 2015-01-05 DIAGNOSIS — Z96641 Presence of right artificial hip joint: Secondary | ICD-10-CM | POA: Diagnosis not present

## 2015-01-08 NOTE — Progress Notes (Signed)
HPI: FU AS. Previously seen for asymptomatic murmur. Transesophageal echocardiogram in March of 2014 showed an ejection fraction of 30-35% and akinesis of the inferior and proximal septal myocardium. There was moderate aortic stenosis (AVA by planimetry 1.2-1.4 cm2) and mild mitral regurgitation. There was mild left atrial enlargement. Cardiac catheterization in April of 2014 showed a pulmonary catheter wedge pressure of 13, 30-40% left main, 70 followed by 80% mid LAD, 60-70% proximal right coronary artery and 70% mid. There was moderate LV dysfunction. There was mild aortic stenosis. I reviewed films with Dr Angelena Form and medical therapy felt indicated. Patient had ICD placed in September 2014. Patient seen in January of 2015 and noted to have intermittent atrial fibrillation on his device interrogation. He was started on xarelto. Echocardiogram repeated August 2015. Ejection fraction 28-31%DVVO grade 1 diastolic dysfunction. Mild aortic stenosis with a mean gradient of 13 mmHg. Moderately reduced right ventricular function and mild tricuspid regurgitation. Since he was last seen, there is no dyspnea, chest pain, syncope or bleeding.  Current Outpatient Prescriptions  Medication Sig Dispense Refill  . carvedilol (COREG) 6.25 MG tablet TAKE ONE TABLET TWICE DAILY WITH MEALS (Patient taking differently: TAKE ONE TABLET BY MOUTH TWICE DAILY WITH MEALS) 60 tablet 5  . levothyroxine (SYNTHROID, LEVOTHROID) 112 MCG tablet TAKE ONE TABLET DAILY (Patient taking differently: TAKE ONE TABLET BY MOUTH DAILY) 30 tablet 3  . lisinopril (PRINIVIL,ZESTRIL) 5 MG tablet TAKE 1 TABLET DAILY. (Patient taking differently: TAKE 1 TABLET BY MOUTH DAILY.) 30 tablet 3  . rivaroxaban (XARELTO) 20 MG TABS tablet Take 1 tablet (20 mg total) by mouth daily with supper. 90 tablet 3  . rosuvastatin (CRESTOR) 10 MG tablet Take 1 tablet (10 mg total) by mouth daily. 28 tablet 0   No current facility-administered medications  for this visit.     Past Medical History  Diagnosis Date  . Hemorrhoids, internal   . Diverticulosis of colon   . Barrett's esophagus   . Cirrhosis of liver     w/o mention of alcohol  . Cancer     lung, lower lobe  . Seborrheic dermatitis   . Hyperlipidemia   . Hypothyroidism   . Hypertension   . GERD (gastroesophageal reflux disease)   . Diabetes mellitus     type 2  . Rheumatic fever   . Aortic stenosis   . Paroxysmal atrial fibrillation 12/14/13    detected on ICD interrogation  . Ischemic cardiomyopathy   . Chronic systolic dysfunction of left ventricle   . Sinus node dysfunction     Past Surgical History  Procedure Laterality Date  . Right middle and lower lobectomy  1998    Dr Arlyce Dice- for lung cancer by Dr Rollene Fare  . Neck surgery  6-00    Dr Annette Stable  . Appendectomy    . Back cervical    . Right hip replacement      Dr Alvan Dame  . Foot surgery    . Cardiac catheterization    . Bilat cat surgery  8-11    Dr Ayesha Rumpf  . Colonoscopy    . Tee without cardioversion N/A 02/14/2013    Procedure: TRANSESOPHAGEAL ECHOCARDIOGRAM (TEE);  Surgeon: Lelon Perla, MD;  Location: [facility] ENDOSCOPY;  Service: Cardiovascular;  Laterality: N/A;  . Cardiac pacemaker placement    . Cardiac defibrillator placement  08/12/13    Medtronic Evera XT DR implanted by [name] for primary prevention of sudden death  . Implantable cardioverter defibrillator implant  N/A 08/12/2013    Procedure: IMPLANTABLE CARDIOVERTER DEFIBRILLATOR IMPLANT;  Surgeon: Coralyn Mark, MD;  Location: [facility] CATH LAB;  Service: Cardiovascular;  Laterality: N/A;    History   Social History  . Marital Status: Widowed    Spouse Name: Hoyle Sauer  . Number of Children: 1  . Years of Education: N/A   Occupational History  .     Social History Main Topics  . Smoking status: Former Smoker    Quit date: 02/14/1989  . Smokeless tobacco: Never Used  . Alcohol Use: No  . Drug Use: No  . Sexual Activity: Not on file    Other Topics Concern  . Not on file   Social History Narrative    ROS: no fevers or chills, productive cough, hemoptysis, dysphasia, odynophagia, melena, hematochezia, dysuria, hematuria, rash, seizure activity, orthopnea, PND, pedal edema, claudication. Remaining systems are negative.  Physical Exam: Well-developed well-nourished in no acute distress.  Skin is warm and dry.  HEENT is normal.  Neck is supple.  Chest is clear to auscultation with normal expansion.  Cardiovascular exam is regular rate and rhythm. 2/6 systolic murmur left sternal border. S2 is not diminished. Abdominal exam nontender or distended. No masses palpated. Extremities show no edema. neuro grossly intact  ECG sinus rhythm with first-degree AV block. Left anterior fascicular block. Right bundle branch block.

## 2015-01-10 ENCOUNTER — Encounter: Payer: Self-pay | Admitting: Cardiology

## 2015-01-10 ENCOUNTER — Ambulatory Visit (INDEPENDENT_AMBULATORY_CARE_PROVIDER_SITE_OTHER): Payer: Medicare Other | Admitting: Cardiology

## 2015-01-10 VITALS — BP 130/68 | HR 79 | Ht 70.5 in | Wt 183.4 lb

## 2015-01-10 DIAGNOSIS — I2583 Coronary atherosclerosis due to lipid rich plaque: Secondary | ICD-10-CM

## 2015-01-10 DIAGNOSIS — I4891 Unspecified atrial fibrillation: Secondary | ICD-10-CM | POA: Diagnosis not present

## 2015-01-10 DIAGNOSIS — I2510 Atherosclerotic heart disease of native coronary artery without angina pectoris: Secondary | ICD-10-CM | POA: Diagnosis not present

## 2015-01-10 DIAGNOSIS — E785 Hyperlipidemia, unspecified: Secondary | ICD-10-CM

## 2015-01-10 DIAGNOSIS — I420 Dilated cardiomyopathy: Secondary | ICD-10-CM

## 2015-01-10 DIAGNOSIS — I350 Nonrheumatic aortic (valve) stenosis: Secondary | ICD-10-CM | POA: Diagnosis not present

## 2015-01-10 DIAGNOSIS — Z95810 Presence of automatic (implantable) cardiac defibrillator: Secondary | ICD-10-CM | POA: Diagnosis not present

## 2015-01-10 NOTE — Assessment & Plan Note (Signed)
Patient remains in sinus rhythm. Continue beta blocker and xarelto. He will need hemoglobin and renal function checked every 6 months.

## 2015-01-10 NOTE — Assessment & Plan Note (Signed)
Continue statin. Not on aspirin given need for anticoagulation. 

## 2015-01-10 NOTE — Patient Instructions (Signed)
Your physician wants you to follow-up in: ONE YEAR WITH DR CRENSHAW You will receive a reminder letter in the mail two months in advance. If you don't receive a letter, please call our office to schedule the follow-up appointment.  

## 2015-01-10 NOTE — Assessment & Plan Note (Signed)
Followed by electrophysiology. 

## 2015-01-10 NOTE — Assessment & Plan Note (Signed)
Still sounds less than severe on examination. Repeat echocardiogram in one year. We can consider a dobutamine echocardiogram if there is any question about severity.

## 2015-01-10 NOTE — Assessment & Plan Note (Signed)
Continue ACE inhibitor and beta blocker. 

## 2015-01-10 NOTE — Assessment & Plan Note (Signed)
Continue statin. 

## 2015-01-17 ENCOUNTER — Other Ambulatory Visit: Payer: Self-pay | Admitting: Cardiology

## 2015-01-17 ENCOUNTER — Telehealth: Payer: Self-pay | Admitting: *Deleted

## 2015-01-17 NOTE — Telephone Encounter (Signed)
Xarelto samples placed at the front desk for patient. 

## 2015-01-17 NOTE — Telephone Encounter (Signed)
Rx has been sent to the pharmacy electronically. ° °

## 2015-02-05 ENCOUNTER — Other Ambulatory Visit: Payer: Self-pay | Admitting: Cardiology

## 2015-02-06 ENCOUNTER — Telehealth: Payer: Self-pay | Admitting: Cardiology

## 2015-02-06 NOTE — Telephone Encounter (Signed)
lmtcb re samples

## 2015-02-06 NOTE — Telephone Encounter (Signed)
Pt said he called yesterday re samples of xarelto, do not see this  message, pls advise if we have any , pls call [phone]

## 2015-02-06 NOTE — Telephone Encounter (Signed)
Left message that we did have samples of Xarelto and put them at front desk at [facility] location but to call us back to let us know he received the message.

## 2015-02-08 ENCOUNTER — Encounter: Payer: Medicare Other | Admitting: *Deleted

## 2015-02-08 ENCOUNTER — Telehealth: Payer: Self-pay | Admitting: *Deleted

## 2015-02-08 NOTE — Telephone Encounter (Signed)
Log book checked and samples were picked up on 3/9

## 2015-02-08 NOTE — Telephone Encounter (Signed)
ICM transmission received. I left a message for the patient to call. 

## 2015-02-12 ENCOUNTER — Telehealth: Payer: Self-pay | Admitting: Cardiology

## 2015-02-12 NOTE — Telephone Encounter (Signed)
Mr Hehl is calling to see if he can get some samples of Crestor 10 mg . Please call    Thanks

## 2015-02-13 NOTE — Telephone Encounter (Signed)
No sampes right now, pt. informed

## 2015-02-15 NOTE — Telephone Encounter (Signed)
No call back from the patient. He has his next remote transmission already scheduled for 03/29/15. Will follow up that time. Current transmission shows optivol readings are normal.

## 2015-02-16 ENCOUNTER — Other Ambulatory Visit: Payer: Self-pay | Admitting: Cardiology

## 2015-02-16 NOTE — Telephone Encounter (Signed)
Medication samples have been provided to the patient.  Drug name: crestor 10mg   Qty: 28  LOT: [id number]  Exp.Date: 07/2017  Samples left at front desk for patient pick-up. Patient notified.  Sheral Apley M 1:58 PM 02/16/2015

## 2015-02-16 NOTE — Telephone Encounter (Signed)
Pt called in wanting some samples of Crestor 10mg . Please call  Thanks

## 2015-03-06 ENCOUNTER — Telehealth: Payer: Self-pay | Admitting: Internal Medicine

## 2015-03-06 NOTE — Telephone Encounter (Signed)
Left message we don't have any eliquis

## 2015-03-06 NOTE — Telephone Encounter (Signed)
Or [phone] pt requesting samples of Eliquis 10 mg

## 2015-03-13 ENCOUNTER — Telehealth: Payer: Self-pay | Admitting: Cardiology

## 2015-03-13 DIAGNOSIS — I4891 Unspecified atrial fibrillation: Secondary | ICD-10-CM

## 2015-03-13 MED ORDER — RIVAROXABAN 20 MG PO TABS: 20.0000 mg | ORAL_TABLET | Freq: Every day | ORAL | Status: DC

## 2015-03-13 NOTE — Telephone Encounter (Signed)
Rx(s) sent to pharmacy electronically.  Patient aware that we currently do not have any Xarelto samples. Patient will pick up prescription from pharmacy.

## 2015-03-13 NOTE — Telephone Encounter (Signed)
Pr would like some samples of Xarelto please.If not there please [phone].

## 2015-03-26 ENCOUNTER — Other Ambulatory Visit: Payer: Self-pay | Admitting: Family Medicine

## 2015-03-26 ENCOUNTER — Telehealth: Payer: Self-pay | Admitting: Cardiology

## 2015-03-26 DIAGNOSIS — I4891 Unspecified atrial fibrillation: Secondary | ICD-10-CM

## 2015-03-26 NOTE — Telephone Encounter (Signed)
Pt is calling in stating that he would like some samples of Crestor and Xarelto. Please f/u with him   Thanks

## 2015-03-27 ENCOUNTER — Telehealth: Payer: Self-pay

## 2015-03-27 MED ORDER — RIVAROXABAN 20 MG PO TABS: 20.0000 mg | ORAL_TABLET | Freq: Every day | ORAL | Status: DC

## 2015-03-27 NOTE — Telephone Encounter (Signed)
NO SAMPLES OF CRESTOR  SAMPLES AVAILABLE FOR XARELTO  20 MG  #15

## 2015-03-27 NOTE — Telephone Encounter (Signed)
Called patient to let him know that I placed samples of xarelto up front for him

## 2015-03-28 ENCOUNTER — Telehealth: Payer: Self-pay | Admitting: Cardiology

## 2015-03-28 MED ORDER — ROSUVASTATIN CALCIUM 10 MG PO TABS
10.0000 mg | ORAL_TABLET | Freq: Every day | ORAL | Status: DC
Start: 2015-03-28 — End: 2015-05-01

## 2015-03-28 NOTE — Telephone Encounter (Signed)
Left message for patient, will place script at the front desk.

## 2015-03-28 NOTE — Telephone Encounter (Signed)
Pt is going to be picking up his Xarelto in about a hour. He would like for you to have a written prescription for the generic Crestor please. He says he will have it for next month.

## 2015-03-29 ENCOUNTER — Ambulatory Visit (INDEPENDENT_AMBULATORY_CARE_PROVIDER_SITE_OTHER): Payer: Medicare Other | Admitting: *Deleted

## 2015-03-29 ENCOUNTER — Encounter: Payer: Self-pay | Admitting: Internal Medicine

## 2015-03-29 DIAGNOSIS — Z95810 Presence of automatic (implantable) cardiac defibrillator: Secondary | ICD-10-CM | POA: Diagnosis not present

## 2015-03-29 DIAGNOSIS — I5022 Chronic systolic (congestive) heart failure: Secondary | ICD-10-CM

## 2015-03-29 DIAGNOSIS — I420 Dilated cardiomyopathy: Secondary | ICD-10-CM

## 2015-03-29 LAB — MDC_IDC_ENUM_SESS_TYPE_REMOTE
Battery Voltage: 3.01 V
Brady Statistic AP VS Percent: 0.69 %
Brady Statistic AS VP Percent: 1.26 %
Brady Statistic AS VS Percent: 97.92 %
Date Time Interrogation Session: 20160428052307
HighPow Impedance: 71 Ohm
Lead Channel Impedance Value: 323 Ohm
Lead Channel Impedance Value: 437 Ohm
Lead Channel Impedance Value: 456 Ohm
Lead Channel Pacing Threshold Amplitude: 0.875 V
Lead Channel Pacing Threshold Amplitude: 0.875 V
Lead Channel Pacing Threshold Pulse Width: 0.4 ms
Lead Channel Pacing Threshold Pulse Width: 0.4 ms
Lead Channel Sensing Intrinsic Amplitude: 2 mV
Lead Channel Setting Pacing Amplitude: 2.5 V
Lead Channel Setting Sensing Sensitivity: 0.3 mV
MDC IDC MSMT BATTERY REMAINING LONGEVITY: 118 mo
MDC IDC MSMT LEADCHNL RV SENSING INTR AMPL: 5.25 mV
MDC IDC SET LEADCHNL RA PACING AMPLITUDE: 2 V
MDC IDC SET LEADCHNL RV PACING PULSEWIDTH: 0.4 ms
MDC IDC SET ZONE DETECTION INTERVAL: 350 ms
MDC IDC STAT BRADY AP VP PERCENT: 0.13 %
MDC IDC STAT BRADY RA PERCENT PACED: 0.82 %
MDC IDC STAT BRADY RV PERCENT PACED: 1.39 %
Zone Setting Detection Interval: 300 ms
Zone Setting Detection Interval: 340 ms
Zone Setting Detection Interval: 360 ms

## 2015-03-29 NOTE — Progress Notes (Signed)
Remote ICD transmission.   

## 2015-04-02 ENCOUNTER — Telehealth: Payer: Self-pay | Admitting: Cardiology

## 2015-04-02 ENCOUNTER — Encounter: Payer: Self-pay | Admitting: *Deleted

## 2015-04-02 ENCOUNTER — Telehealth: Payer: Self-pay | Admitting: *Deleted

## 2015-04-02 NOTE — Telephone Encounter (Signed)
ICM transmission received. I left a message for the patient to call at his home and cell #'s. 

## 2015-04-02 NOTE — Telephone Encounter (Signed)
Pt says he he thinks [name] called around 12:03,he is returning somebody's call.

## 2015-04-02 NOTE — Telephone Encounter (Signed)
I spoke with the patient. 

## 2015-04-02 NOTE — Progress Notes (Signed)
EPIC Encounter for ICM Monitoring  Patient Name: Steven Everett is a 80 y.o. male Date: 04/02/2015 Primary Care Physican: Beatrice Lecher, MD Primary Cardiologist: Stanford Breed Electrophysiologist: Allred Dry Weight: 180-183 lbs       In the past month, have you:  1. Gained more than 2 pounds in a day or more than 5 pounds in a week? no  2. Had changes in your medications (with verification of current medications)? no  3. Had more shortness of breath than is usual for you? no  4. Limited your activity because of shortness of breath? no  5. Not been able to sleep because of shortness of breath? no  6. Had increased swelling in your feet or ankles? no  7. Had symptoms of dehydration (dizziness, dry mouth, increased thirst, decreased urine output) no  8. Had changes in sodium restriction? no  9. Been compliant with medication? Yes   ICM trend:   Follow-up plan: ICM clinic phone appointment: 05/17/15. No changes made today.  Copy of note sent to patient's primary care physician, primary cardiologist, and device following physician.  Alvis Lemmings, RN, BSN 04/02/2015 2:49 PM

## 2015-04-02 NOTE — Addendum Note (Signed)
Addended by: Alvis Lemmings C on: 04/02/2015 02:53 PM   Modules accepted: Level of Service

## 2015-04-03 ENCOUNTER — Encounter: Payer: Self-pay | Admitting: Cardiology

## 2015-05-01 ENCOUNTER — Telehealth: Payer: Self-pay | Admitting: Cardiology

## 2015-05-01 MED ORDER — ROSUVASTATIN CALCIUM 10 MG PO TABS: 10.0000 mg | ORAL_TABLET | Freq: Every day | ORAL | Status: DC

## 2015-05-01 NOTE — Telephone Encounter (Signed)
Steven Everett is calling because he wants to speak to you [name] about the generic form of Crestor .Marland Kitchen Please call

## 2015-05-01 NOTE — Telephone Encounter (Signed)
Refill sent to the pharmacy 

## 2015-05-01 NOTE — Telephone Encounter (Signed)
[name] this pt. Wants to speak to you about his Crestor when you get a chance

## 2015-05-15 ENCOUNTER — Ambulatory Visit (INDEPENDENT_AMBULATORY_CARE_PROVIDER_SITE_OTHER): Payer: Medicare Other | Admitting: Family Medicine

## 2015-05-15 ENCOUNTER — Encounter: Payer: Self-pay | Admitting: Family Medicine

## 2015-05-15 VITALS — BP 110/62 | HR 79 | Wt 185.0 lb

## 2015-05-15 DIAGNOSIS — E039 Hypothyroidism, unspecified: Secondary | ICD-10-CM

## 2015-05-15 DIAGNOSIS — N189 Chronic kidney disease, unspecified: Secondary | ICD-10-CM | POA: Insufficient documentation

## 2015-05-15 DIAGNOSIS — N183 Chronic kidney disease, stage 3 (moderate): Secondary | ICD-10-CM

## 2015-05-15 DIAGNOSIS — R7301 Impaired fasting glucose: Secondary | ICD-10-CM | POA: Diagnosis not present

## 2015-05-15 DIAGNOSIS — I2510 Atherosclerotic heart disease of native coronary artery without angina pectoris: Secondary | ICD-10-CM | POA: Diagnosis not present

## 2015-05-15 LAB — POCT UA - MICROALBUMIN
Creatinine, POC: 50 mg/dL
Microalbumin Ur, POC: 10 mg/L

## 2015-05-15 NOTE — Progress Notes (Signed)
   Subjective:    Patient ID: Steven Everett, male    DOB: [birth year], 80 y.o.   MRN: [id number]  HPI IFG - no inc thirst or urination.   Hypothyroidism-no weight changes. No skin or hair changes. Energy levels are okay. Lab Results  Component Value Date   TSH 1.940 11/16/2014    Review of Systems     Objective:   Physical Exam  Constitutional: He is oriented to person, place, and time. He appears well-developed and well-nourished.  HENT:  Head: Normocephalic and atraumatic.  Cardiovascular: Normal rate, regular rhythm and normal heart sounds.   Pulmonary/Chest: Effort normal and breath sounds normal.  Neurological: He is alert and oriented to person, place, and time.  Skin: Skin is warm and dry.  Psychiatric: He has a normal mood and affect. His behavior is normal.          Assessment & Plan:  IFG - well controlled. A1C of 5.5. F/U in 6 months.  He is still ogng to the gym 3 days a week.   Hypothyroidism - due to recheck TSH. Asymtomatic.    Discussed need for prevnar 13.

## 2015-05-15 NOTE — Addendum Note (Signed)
Addended by: Narda Rutherford on: 05/15/2015 02:37 PM   Modules accepted: Orders

## 2015-05-17 ENCOUNTER — Ambulatory Visit (INDEPENDENT_AMBULATORY_CARE_PROVIDER_SITE_OTHER): Payer: Medicare Other | Admitting: *Deleted

## 2015-05-17 ENCOUNTER — Encounter: Payer: Self-pay | Admitting: *Deleted

## 2015-05-17 ENCOUNTER — Telehealth: Payer: Self-pay | Admitting: *Deleted

## 2015-05-17 DIAGNOSIS — I5022 Chronic systolic (congestive) heart failure: Secondary | ICD-10-CM | POA: Diagnosis not present

## 2015-05-17 DIAGNOSIS — Z95810 Presence of automatic (implantable) cardiac defibrillator: Secondary | ICD-10-CM | POA: Diagnosis not present

## 2015-05-17 NOTE — Telephone Encounter (Signed)
ICM transmission received. I left a message for the patient to call. 

## 2015-05-17 NOTE — Progress Notes (Addendum)
EPIC Encounter for ICM Monitoring  Patient Name: Steven Everett is a 80 y.o. male Date: 05/17/2015 Primary Care Physican: Beatrice Lecher, MD Primary Cardiologist: Stanford Breed Electrophysiologist: Allred Dry Weight: 180-183 lbs       In the past month, have you:  1. Gained more than 2 pounds in a day or more than 5 pounds in a week? no  2. Had changes in your medications (with verification of current medications)? no  3. Had more shortness of breath than is usual for you? no  4. Limited your activity because of shortness of breath? no  5. Not been able to sleep because of shortness of breath? no  6. Had increased swelling in your feet or ankles? no  7. Had symptoms of dehydration (dizziness, dry mouth, increased thirst, decreased urine output) no  8. Had changes in sodium restriction? no  9. Been compliant with medication? Yes   ICM trend:   Follow-up plan: ICM clinic phone appointment: 06/28/15. No changes made today. I did advise the patient to stay hydrated with the heat.  Copy of note sent to patient's primary care physician, primary cardiologist, and device following physician.  Alvis Lemmings, RN, BSN 05/17/2015 1:33 PM

## 2015-05-17 NOTE — Telephone Encounter (Signed)
I spoke with the patient. 

## 2015-05-27 DIAGNOSIS — R21 Rash and other nonspecific skin eruption: Secondary | ICD-10-CM | POA: Diagnosis not present

## 2015-05-27 DIAGNOSIS — W57XXXA Bitten or stung by nonvenomous insect and other nonvenomous arthropods, initial encounter: Secondary | ICD-10-CM | POA: Diagnosis not present

## 2015-05-30 ENCOUNTER — Other Ambulatory Visit: Payer: Self-pay | Admitting: *Deleted

## 2015-05-30 ENCOUNTER — Encounter: Payer: Self-pay | Admitting: *Deleted

## 2015-05-30 DIAGNOSIS — I4891 Unspecified atrial fibrillation: Secondary | ICD-10-CM

## 2015-06-05 ENCOUNTER — Telehealth: Payer: Self-pay | Admitting: Cardiology

## 2015-06-05 DIAGNOSIS — Z79899 Other long term (current) drug therapy: Secondary | ICD-10-CM

## 2015-06-05 DIAGNOSIS — E785 Hyperlipidemia, unspecified: Secondary | ICD-10-CM

## 2015-06-05 NOTE — Telephone Encounter (Signed)
NewMessage  Pt calling about samples of xarelto. Please call back and discuss.

## 2015-06-06 NOTE — Telephone Encounter (Signed)
Samples of Xarelto '20mg'$  5 bottles provided to patient.

## 2015-06-07 ENCOUNTER — Telehealth: Payer: Self-pay | Admitting: *Deleted

## 2015-06-07 NOTE — Telephone Encounter (Signed)
Called pt and informed him that he will need to get a cbc done and the order will be faxed to the lab.Audelia Hives [place]

## 2015-06-08 DIAGNOSIS — I4891 Unspecified atrial fibrillation: Secondary | ICD-10-CM | POA: Diagnosis not present

## 2015-06-09 LAB — CBC
HEMATOCRIT: 47.2 % (ref 39.0–52.0)
Hemoglobin: 15.3 g/dL (ref 13.0–17.0)
MCH: 28.3 pg (ref 26.0–34.0)
MCHC: 32.4 g/dL (ref 30.0–36.0)
MCV: 87.2 fL (ref 78.0–100.0)
MPV: 10.6 fL (ref 8.6–12.4)
Platelets: 153 10*3/uL (ref 150–400)
RBC: 5.41 MIL/uL (ref 4.22–5.81)
RDW: 14 % (ref 11.5–15.5)
WBC: 7.3 10*3/uL (ref 4.0–10.5)

## 2015-06-09 LAB — BASIC METABOLIC PANEL WITH GFR
BUN: 22 mg/dL (ref 6–23)
CO2: 25 mEq/L (ref 19–32)
Calcium: 9.5 mg/dL (ref 8.4–10.5)
Chloride: 103 mEq/L (ref 96–112)
Creat: 1.14 mg/dL (ref 0.50–1.35)
GFR, Est African American: 71 mL/min
GFR, Est Non African American: 61 mL/min
Glucose, Bld: 89 mg/dL (ref 70–99)
Potassium: 4.6 mEq/L (ref 3.5–5.3)
Sodium: 139 mEq/L (ref 135–145)

## 2015-06-12 ENCOUNTER — Telehealth: Payer: Self-pay

## 2015-06-12 NOTE — Telephone Encounter (Signed)
Prior auth for Crestor '10mg'$  sent to [facility] via Cover My Meds.

## 2015-06-13 NOTE — Telephone Encounter (Signed)
Dc crestor, pravachol 40 mg daily, lipids and liver in six weeks Kirk Ruths

## 2015-06-13 NOTE — Telephone Encounter (Signed)
Pt states Crestor too much - since going generic. Generic option is $93 for 30 day supply - twice as much as he'd been paying.  Asked if we had samples - informed him we are not stocked w/ samples for this med anymore. States "well I'll just quite taking it - can't afford it".  Asked if he'd consider another med if available - he states yes but can't take lipitor. Allergy listed is noted.  Will defer to Dr. Stanford Breed for options.

## 2015-06-13 NOTE — Telephone Encounter (Signed)
Pt is calling in wanting to speak with a nurse about his Crestor prescription. He says that his insurance isn't trying to pay for the generic drug and he isn't sure on what to do.   Thanks

## 2015-06-14 MED ORDER — PRAVASTATIN SODIUM 40 MG PO TABS: 40.0000 mg | ORAL_TABLET | Freq: Every evening | ORAL | Status: DC

## 2015-06-14 NOTE — Telephone Encounter (Signed)
Patient notified of med changes recommended per MD and agrees with plan. Med ordered and lab slip mailed to patient.

## 2015-06-15 ENCOUNTER — Telehealth: Payer: Self-pay

## 2015-06-15 NOTE — Telephone Encounter (Signed)
Fax from [place] with approval for generic Crestor, good through 12/01/2015. Pharmacy notified.

## 2015-06-28 ENCOUNTER — Ambulatory Visit (INDEPENDENT_AMBULATORY_CARE_PROVIDER_SITE_OTHER): Payer: Medicare Other

## 2015-06-28 DIAGNOSIS — Z95810 Presence of automatic (implantable) cardiac defibrillator: Secondary | ICD-10-CM | POA: Diagnosis not present

## 2015-06-28 DIAGNOSIS — I495 Sick sinus syndrome: Secondary | ICD-10-CM | POA: Diagnosis not present

## 2015-06-28 DIAGNOSIS — I5022 Chronic systolic (congestive) heart failure: Secondary | ICD-10-CM | POA: Diagnosis not present

## 2015-06-28 DIAGNOSIS — I420 Dilated cardiomyopathy: Secondary | ICD-10-CM

## 2015-07-05 NOTE — Progress Notes (Signed)
OptiVol reviewed, trends stable for patient at this time.

## 2015-07-07 LAB — CUP PACEART REMOTE DEVICE CHECK
Battery Remaining Longevity: 115 mo
Battery Voltage: 3.01 V
Brady Statistic AP VP Percent: 0.27 %
Brady Statistic AP VS Percent: 1.26 %
Brady Statistic AS VP Percent: 1.67 %
Brady Statistic RA Percent Paced: 1.53 %
Date Time Interrogation Session: 20160728062707
HighPow Impedance: 70 Ohm
Lead Channel Impedance Value: 399 Ohm
Lead Channel Pacing Threshold Amplitude: 0.875 V
Lead Channel Pacing Threshold Amplitude: 1.125 V
Lead Channel Pacing Threshold Pulse Width: 0.4 ms
Lead Channel Sensing Intrinsic Amplitude: 2.125 mV
Lead Channel Sensing Intrinsic Amplitude: 2.125 mV
Lead Channel Sensing Intrinsic Amplitude: 4.875 mV
Lead Channel Sensing Intrinsic Amplitude: 4.875 mV
Lead Channel Setting Pacing Amplitude: 2 V
Lead Channel Setting Pacing Amplitude: 2.5 V
Lead Channel Setting Sensing Sensitivity: 0.3 mV
MDC IDC MSMT LEADCHNL RA IMPEDANCE VALUE: 456 Ohm
MDC IDC MSMT LEADCHNL RV IMPEDANCE VALUE: 285 Ohm
MDC IDC MSMT LEADCHNL RV PACING THRESHOLD PULSEWIDTH: 0.4 ms
MDC IDC SET LEADCHNL RV PACING PULSEWIDTH: 0.4 ms
MDC IDC SET ZONE DETECTION INTERVAL: 300 ms
MDC IDC STAT BRADY AS VS PERCENT: 96.8 %
MDC IDC STAT BRADY RV PERCENT PACED: 1.94 %
Zone Setting Detection Interval: 340 ms
Zone Setting Detection Interval: 350 ms
Zone Setting Detection Interval: 360 ms

## 2015-07-16 ENCOUNTER — Encounter: Payer: Self-pay | Admitting: Cardiology

## 2015-07-23 ENCOUNTER — Encounter: Payer: Self-pay | Admitting: Internal Medicine

## 2015-07-24 ENCOUNTER — Telehealth: Payer: Self-pay | Admitting: *Deleted

## 2015-07-24 NOTE — Telephone Encounter (Signed)
Xarelto samples placed at the front desk for patient. 

## 2015-07-31 ENCOUNTER — Other Ambulatory Visit: Payer: Self-pay | Admitting: Cardiology

## 2015-07-31 DIAGNOSIS — Z79899 Other long term (current) drug therapy: Secondary | ICD-10-CM | POA: Diagnosis not present

## 2015-07-31 DIAGNOSIS — E785 Hyperlipidemia, unspecified: Secondary | ICD-10-CM | POA: Diagnosis not present

## 2015-07-31 LAB — HEPATIC FUNCTION PANEL
ALBUMIN: 3.8 g/dL (ref 3.6–5.1)
ALK PHOS: 73 U/L (ref 40–115)
ALT: 24 U/L (ref 9–46)
AST: 25 U/L (ref 10–35)
Bilirubin, Direct: 0.2 mg/dL (ref <=0.2)
Indirect Bilirubin: 0.6 mg/dL (ref 0.2–1.2)
TOTAL PROTEIN: 6.6 g/dL (ref 6.1–8.1)
Total Bilirubin: 0.8 mg/dL (ref 0.2–1.2)

## 2015-07-31 LAB — LIPID PANEL
Cholesterol: 141 mg/dL (ref 125–200)
HDL: 53 mg/dL (ref >=40)
LDL CALC: 79 mg/dL (ref <130)
TRIGLYCERIDES: 47 mg/dL (ref <150)
Total CHOL/HDL Ratio: 2.7 Ratio (ref <=5.0)
VLDL: 9 mg/dL (ref <30)

## 2015-08-01 ENCOUNTER — Encounter: Payer: Self-pay | Admitting: *Deleted

## 2015-08-15 ENCOUNTER — Telehealth: Payer: Self-pay

## 2015-08-15 NOTE — Telephone Encounter (Signed)
ICM transmission received.  Attempted call to patient and left message for return call.

## 2015-08-17 NOTE — Telephone Encounter (Signed)
Unable to reach patient for ICM follow up.  Optivol impedance stable.  Will attempt follow up next month.

## 2015-08-22 ENCOUNTER — Telehealth: Payer: Self-pay | Admitting: Internal Medicine

## 2015-08-22 ENCOUNTER — Other Ambulatory Visit: Payer: Self-pay | Admitting: Cardiology

## 2015-08-22 NOTE — Telephone Encounter (Signed)
New Message   Pt calling about Xarelto samples. Please call back and discuss.

## 2015-08-22 NOTE — Telephone Encounter (Signed)
Patient calling the office for samples of medication:   1.  What medication and dosage are you requesting samples for? Xarleto  2.  Are you currently out of this medication? Yes  3. Are you requesting samples to get you through until a mail order prescription arrives? No

## 2015-08-23 NOTE — Telephone Encounter (Signed)
Steven Everett is calling about samples of Xarelto.. Please call

## 2015-08-23 NOTE — Telephone Encounter (Signed)
Medication samples have been provided to the patient. Drug name: Xarelto 20 mg Qty: 20 tabs LOT: [id number] Exp.Date: 12/2017 Samples left at front desk for patient pick-up. Patient notified.  [place], [place] 3:52 PM 08/23/2015

## 2015-08-24 NOTE — Telephone Encounter (Signed)
Left message for patient that no Xarelto samples available.

## 2015-08-31 DIAGNOSIS — L299 Pruritus, unspecified: Secondary | ICD-10-CM | POA: Diagnosis not present

## 2015-08-31 DIAGNOSIS — Z23 Encounter for immunization: Secondary | ICD-10-CM | POA: Diagnosis not present

## 2015-08-31 DIAGNOSIS — H9210 Otorrhea, unspecified ear: Secondary | ICD-10-CM | POA: Diagnosis not present

## 2015-09-17 ENCOUNTER — Telehealth: Payer: Self-pay | Admitting: Internal Medicine

## 2015-09-17 NOTE — Telephone Encounter (Signed)
New message      Patient calling the office for samples of medication:   1.  What medication and dosage are you requesting samples for? xarelto '20mg'$   2.  Are you currently out of this medication? Have 3 pills left  3. Are you requesting samples to get you through until you receive your prescription? Pt is in donut hole

## 2015-09-18 NOTE — Telephone Encounter (Signed)
F/U  Pt following up on Xarelto samples- see prev note; Please call back and discuss.

## 2015-09-19 ENCOUNTER — Other Ambulatory Visit: Payer: Self-pay | Admitting: Cardiology

## 2015-09-19 NOTE — Telephone Encounter (Signed)
Patient calling the office for samples of medication:   1.  What medication and dosage are you requesting samples for? xarelto   2.  Are you currently out of this medication? Yes   3. Are you requesting samples to get you through until you receive your prescription? No

## 2015-09-19 NOTE — Telephone Encounter (Signed)
Called pt to let him know that his sample is up at the front desk waiting on him. 1 Bottle.

## 2015-09-19 NOTE — Telephone Encounter (Signed)
Medication samples have been provided to the patient.  Drug name: xarelto 33  Qty: 28  LOT: [id number]  Exp.Date: 03/2018  Samples left at front desk for patient pick-up. Patient notified.  Sheral Apley M 4:40 PM 09/19/2015

## 2015-09-19 NOTE — Telephone Encounter (Signed)
Follow up     Patient calling the office for samples of medication:   1.  What medication and dosage are you requesting samples for? xarelto '20mg'$   2.  Are you currently out of this medication? 2 left  3. Are you requesting samples to get you through until you receive your prescription? No---pt in donut hole

## 2015-09-27 ENCOUNTER — Ambulatory Visit (INDEPENDENT_AMBULATORY_CARE_PROVIDER_SITE_OTHER): Payer: Medicare Other | Admitting: *Deleted

## 2015-09-27 DIAGNOSIS — Z95810 Presence of automatic (implantable) cardiac defibrillator: Secondary | ICD-10-CM | POA: Diagnosis not present

## 2015-09-27 DIAGNOSIS — I420 Dilated cardiomyopathy: Secondary | ICD-10-CM | POA: Diagnosis not present

## 2015-09-27 NOTE — Progress Notes (Signed)
EPIC Encounter for ICM Monitoring  Patient Name: Steven Everett is a 80 y.o. male Date: 09/27/2015 Primary Care Physican: Beatrice Lecher, MD Primary Cardiologist: Stanford Breed Electrophysiologist: Allred Dry Weight: 182 lbs       In the past month, have you:  1. Gained more than 2 pounds in a day or more than 5 pounds in a week? no  2. Had changes in your medications (with verification of current medications)? no  3. Had more shortness of breath than is usual for you? no  4. Limited your activity because of shortness of breath? no  5. Not been able to sleep because of shortness of breath? no  6. Had increased swelling in your feet or ankles? no  7. Had symptoms of dehydration (dizziness, dry mouth, increased thirst, decreased urine output) no  8. Had changes in sodium restriction? no  9. Been compliant with medication? Yes   ICM trend: 09/07/2015   Follow-up plan: ICM clinic phone appointment on 10/30/2015.  Optivol impedance trending along baseline.  He reported he is getting over a cold but other than that, he is doing well.  No changes today.   Copy of note sent to patient's primary care physician, primary cardiologist, and device following physician.  Rosalene Billings, RN, CCM 09/27/2015 1:51 PM

## 2015-09-27 NOTE — Progress Notes (Signed)
Remote ICD transmission.   

## 2015-10-03 ENCOUNTER — Telehealth: Payer: Self-pay

## 2015-10-03 NOTE — Telephone Encounter (Signed)
Call to patient and he reported the wrong date is on his letter for the next home transmission.  Advised will send new letter with correct remote transmission date which will be 10/30/2015.   Received voice mail message from patient requesting return call.

## 2015-10-09 LAB — CUP PACEART REMOTE DEVICE CHECK
Battery Voltage: 3.01 V
Brady Statistic RA Percent Paced: 1.02 %
Brady Statistic RV Percent Paced: 2.69 %
Date Time Interrogation Session: 20161027125503
HighPow Impedance: 72 Ohm
Implantable Lead Location: 753860
Implantable Lead Model: 5076
Lead Channel Impedance Value: 285 Ohm
Lead Channel Impedance Value: 437 Ohm
Lead Channel Pacing Threshold Pulse Width: 0.4 ms
Lead Channel Sensing Intrinsic Amplitude: 2.125 mV
Lead Channel Sensing Intrinsic Amplitude: 4.25 mV
Lead Channel Sensing Intrinsic Amplitude: 4.25 mV
Lead Channel Setting Pacing Amplitude: 2 V
Lead Channel Setting Pacing Pulse Width: 0.4 ms
MDC IDC LEAD IMPLANT DT: 20140914
MDC IDC LEAD IMPLANT DT: 20140914
MDC IDC LEAD LOCATION: 753859
MDC IDC LEAD MODEL: 6935
MDC IDC MSMT BATTERY REMAINING LONGEVITY: 112 mo
MDC IDC MSMT LEADCHNL RA IMPEDANCE VALUE: 456 Ohm
MDC IDC MSMT LEADCHNL RA PACING THRESHOLD AMPLITUDE: 1 V
MDC IDC MSMT LEADCHNL RA SENSING INTR AMPL: 2.125 mV
MDC IDC MSMT LEADCHNL RV PACING THRESHOLD AMPLITUDE: 0.875 V
MDC IDC MSMT LEADCHNL RV PACING THRESHOLD PULSEWIDTH: 0.4 ms
MDC IDC SET LEADCHNL RV PACING AMPLITUDE: 2.5 V
MDC IDC SET LEADCHNL RV SENSING SENSITIVITY: 0.3 mV
MDC IDC STAT BRADY AP VP PERCENT: 0.2 %
MDC IDC STAT BRADY AP VS PERCENT: 0.82 %
MDC IDC STAT BRADY AS VP PERCENT: 2.49 %
MDC IDC STAT BRADY AS VS PERCENT: 96.5 %

## 2015-10-10 ENCOUNTER — Encounter: Payer: Self-pay | Admitting: Cardiology

## 2015-10-18 ENCOUNTER — Encounter: Payer: Self-pay | Admitting: Family Medicine

## 2015-10-18 ENCOUNTER — Ambulatory Visit (INDEPENDENT_AMBULATORY_CARE_PROVIDER_SITE_OTHER): Payer: Medicare Other | Admitting: Family Medicine

## 2015-10-18 ENCOUNTER — Ambulatory Visit (INDEPENDENT_AMBULATORY_CARE_PROVIDER_SITE_OTHER): Payer: Medicare Other

## 2015-10-18 VITALS — BP 134/64 | HR 67 | Ht 70.5 in | Wt 184.0 lb

## 2015-10-18 DIAGNOSIS — Z9889 Other specified postprocedural states: Secondary | ICD-10-CM | POA: Diagnosis not present

## 2015-10-18 DIAGNOSIS — Z85118 Personal history of other malignant neoplasm of bronchus and lung: Secondary | ICD-10-CM

## 2015-10-18 DIAGNOSIS — E039 Hypothyroidism, unspecified: Secondary | ICD-10-CM

## 2015-10-18 DIAGNOSIS — I2510 Atherosclerotic heart disease of native coronary artery without angina pectoris: Secondary | ICD-10-CM | POA: Diagnosis not present

## 2015-10-18 DIAGNOSIS — I350 Nonrheumatic aortic (valve) stenosis: Secondary | ICD-10-CM

## 2015-10-18 DIAGNOSIS — J948 Other specified pleural conditions: Secondary | ICD-10-CM | POA: Diagnosis not present

## 2015-10-18 DIAGNOSIS — R7301 Impaired fasting glucose: Secondary | ICD-10-CM

## 2015-10-18 DIAGNOSIS — I2583 Coronary atherosclerosis due to lipid rich plaque: Secondary | ICD-10-CM

## 2015-10-18 DIAGNOSIS — I5022 Chronic systolic (congestive) heart failure: Secondary | ICD-10-CM

## 2015-10-18 DIAGNOSIS — Z23 Encounter for immunization: Secondary | ICD-10-CM

## 2015-10-18 LAB — POCT GLYCOSYLATED HEMOGLOBIN (HGB A1C): HEMOGLOBIN A1C: 5.5

## 2015-10-18 LAB — TSH: TSH: 2.128 u[IU]/mL (ref 0.350–4.500)

## 2015-10-18 NOTE — Progress Notes (Signed)
   Subjective:    Patient ID: Steven Everett, male    DOB: Aug 26, 1935, 80 y.o.   MRN: [id number]  HPI Hypertension- Pt denies chest pain, SOB, dizziness, or heart palpitations.  Taking meds as directed w/o problems.  Denies medication side effects.  Still working out 3 times per week and sometimes 4.  Brought in home BP log and he is taking 120/70s.   Aortic stenosis/CAD - He decided to stop his Statin. No CP.  Has follow-up with his cardiologist in.  CHF, systolic- he decided to stop his ACE. No swelling or SOB. Still working out regularly.    IFG - due to recheck glucose. No inc thirst or urination.   Review of Systems     Objective:   Physical Exam  Constitutional: He is oriented to person, place, and time. He appears well-developed and well-nourished.  HENT:  Head: Normocephalic and atraumatic.  Neck: Neck supple. No thyromegaly present.  Cardiovascular: Normal rate, regular rhythm and normal heart sounds.   Pulmonary/Chest: Effort normal and breath sounds normal.  Lymphadenopathy:    He has no cervical adenopathy.  Neurological: He is alert and oriented to person, place, and time.  Skin: Skin is warm and dry.  Psychiatric: He has a normal mood and affect. His behavior is normal.          Assessment & Plan:  HTN - well controlled. Continue current regimen.  Aortic stenosis/CAD- discussed the importance of taking a statin explained how it can prevent further plaque formation as well as stabilize plaque which is how it works to reduce the risk of heart attack and stroke. Once I explained the importance and how it works he agreed to continue to take the medication.  CHF - chronic systolic heart failure with pacemaker-also explained to him the importance of taking his ACE inhibitor and how it works to help improve function with his heart. He is on a low dose and since he has tolerated it well I strongly encouraged him to stay on it. Again once I explained that he agreed to  continue the medication. He had actually been checking his blood pressures in hopes that we can stop the medication as he thought it was purely for blood pressure control.  IFG - hemoglobin A1c of 5.5 today which is absolutely fantastic. We'll continue to keep an eye on this periodically. He is doing great.  Hx of lung cancer - repeat CXR ordered today.  Prevnar 13 vaccine given today.Marland Kitchen

## 2015-10-21 ENCOUNTER — Emergency Department (HOSPITAL_COMMUNITY)
Admission: EM | Admit: 2015-10-21 | Discharge: 2015-10-21 | Disposition: A | Discharge status: Home / Self Care | Payer: Medicare Other | Attending: Emergency Medicine | Admitting: Emergency Medicine

## 2015-10-21 ENCOUNTER — Encounter (HOSPITAL_COMMUNITY): Payer: Self-pay | Admitting: Emergency Medicine

## 2015-10-21 ENCOUNTER — Emergency Department (HOSPITAL_COMMUNITY): Payer: Medicare Other

## 2015-10-21 DIAGNOSIS — Z79899 Other long term (current) drug therapy: Secondary | ICD-10-CM | POA: Diagnosis not present

## 2015-10-21 DIAGNOSIS — E785 Hyperlipidemia, unspecified: Secondary | ICD-10-CM | POA: Insufficient documentation

## 2015-10-21 DIAGNOSIS — Z95810 Presence of automatic (implantable) cardiac defibrillator: Secondary | ICD-10-CM | POA: Diagnosis not present

## 2015-10-21 DIAGNOSIS — I480 Paroxysmal atrial fibrillation: Secondary | ICD-10-CM | POA: Insufficient documentation

## 2015-10-21 DIAGNOSIS — Z8719 Personal history of other diseases of the digestive system: Secondary | ICD-10-CM | POA: Insufficient documentation

## 2015-10-21 DIAGNOSIS — R0789 Other chest pain: Secondary | ICD-10-CM | POA: Diagnosis not present

## 2015-10-21 DIAGNOSIS — E039 Hypothyroidism, unspecified: Secondary | ICD-10-CM | POA: Insufficient documentation

## 2015-10-21 DIAGNOSIS — Z7901 Long term (current) use of anticoagulants: Secondary | ICD-10-CM | POA: Insufficient documentation

## 2015-10-21 DIAGNOSIS — E119 Type 2 diabetes mellitus without complications: Secondary | ICD-10-CM | POA: Diagnosis not present

## 2015-10-21 DIAGNOSIS — G8929 Other chronic pain: Secondary | ICD-10-CM | POA: Diagnosis not present

## 2015-10-21 DIAGNOSIS — R079 Chest pain, unspecified: Secondary | ICD-10-CM | POA: Diagnosis not present

## 2015-10-21 DIAGNOSIS — R918 Other nonspecific abnormal finding of lung field: Secondary | ICD-10-CM | POA: Diagnosis not present

## 2015-10-21 DIAGNOSIS — Z9889 Other specified postprocedural states: Secondary | ICD-10-CM | POA: Insufficient documentation

## 2015-10-21 DIAGNOSIS — I10 Essential (primary) hypertension: Secondary | ICD-10-CM | POA: Insufficient documentation

## 2015-10-21 DIAGNOSIS — I519 Heart disease, unspecified: Secondary | ICD-10-CM | POA: Diagnosis not present

## 2015-10-21 DIAGNOSIS — Z85118 Personal history of other malignant neoplasm of bronchus and lung: Secondary | ICD-10-CM | POA: Insufficient documentation

## 2015-10-21 DIAGNOSIS — Z872 Personal history of diseases of the skin and subcutaneous tissue: Secondary | ICD-10-CM | POA: Insufficient documentation

## 2015-10-21 DIAGNOSIS — Z87891 Personal history of nicotine dependence: Secondary | ICD-10-CM | POA: Insufficient documentation

## 2015-10-21 LAB — COMPREHENSIVE METABOLIC PANEL
ALBUMIN: 3.4 g/dL — AB (ref 3.5–5.0)
ALK PHOS: 65 U/L (ref 38–126)
ALT: 26 U/L (ref 17–63)
ANION GAP: 6 (ref 5–15)
AST: 30 U/L (ref 15–41)
BILIRUBIN TOTAL: 0.7 mg/dL (ref 0.3–1.2)
BUN: 14 mg/dL (ref 6–20)
CALCIUM: 9.6 mg/dL (ref 8.9–10.3)
CO2: 27 mmol/L (ref 22–32)
Chloride: 103 mmol/L (ref 101–111)
Creatinine, Ser: 1.11 mg/dL (ref 0.61–1.24)
GLUCOSE: 105 mg/dL — AB (ref 65–99)
POTASSIUM: 4.1 mmol/L (ref 3.5–5.1)
Sodium: 136 mmol/L (ref 135–145)
TOTAL PROTEIN: 6.4 g/dL — AB (ref 6.5–8.1)

## 2015-10-21 LAB — CBC WITH DIFFERENTIAL/PLATELET
BASOS PCT: 1 %
Basophils Absolute: 0 10*3/uL (ref 0.0–0.1)
Eosinophils Absolute: 0.3 10*3/uL (ref 0.0–0.7)
Eosinophils Relative: 5 %
HEMATOCRIT: 44.6 % (ref 39.0–52.0)
HEMOGLOBIN: 14.6 g/dL (ref 13.0–17.0)
LYMPHS ABS: 1.4 10*3/uL (ref 0.7–4.0)
Lymphocytes Relative: 23 %
MCH: 29 pg (ref 26.0–34.0)
MCHC: 32.7 g/dL (ref 30.0–36.0)
MCV: 88.5 fL (ref 78.0–100.0)
MONO ABS: 0.5 10*3/uL (ref 0.1–1.0)
MONOS PCT: 9 %
Neutro Abs: 3.7 10*3/uL (ref 1.7–7.7)
Neutrophils Relative %: 62 %
Platelets: 143 10*3/uL — ABNORMAL LOW (ref 150–400)
RBC: 5.04 MIL/uL (ref 4.22–5.81)
RDW: 13.4 % (ref 11.5–15.5)
WBC: 5.9 10*3/uL (ref 4.0–10.5)

## 2015-10-21 LAB — I-STAT TROPONIN, ED
TROPONIN I, POC: 0 ng/mL (ref 0.00–0.08)
TROPONIN I, POC: 0.02 ng/mL (ref 0.00–0.08)

## 2015-10-21 NOTE — ED Provider Notes (Signed)
CSN: [id number]     Arrival date & time 10/21/15  1520 History   First MD Initiated Contact with Patient 10/21/15 1521     Chief Complaint  Patient presents with  . Chest Pain     (Consider location/radiation/quality/duration/timing/severity/associated sxs/prior Treatment) The history is provided by the patient.  Steven Everett is a 80 y.o. male hx of cirrhosis, HL, HTN, GERD, DM, aortic stenosis, afib on xarelto, pacemaker here with chest pain. Substernal chest pain acute onset around 2:30 PM when he was watching a ballgame. Denies any shortness of breath or radiation of the pain. Last about 5-10 minutes.  He called EMS and the pain has resolved when EMS got there. He was not given anything by EMS. Denies any recent travel. Denies that his pacemaker has shocked him. Patient does not have any cardiac stents.    Past Medical History  Diagnosis Date  . Hemorrhoids, internal   . Diverticulosis of colon   . Barrett's esophagus   . Cirrhosis of liver (HCC)     w/o mention of alcohol  . Cancer ([place])     lung, lower lobe  . Seborrheic dermatitis   . Hyperlipidemia   . Hypothyroidism   . Hypertension   . GERD (gastroesophageal reflux disease)   . Diabetes mellitus     type 2  . Rheumatic fever   . Aortic stenosis   . Paroxysmal atrial fibrillation ([place]) 12/14/13    detected on ICD interrogation  . Ischemic cardiomyopathy   . Chronic systolic dysfunction of left ventricle   . Sinus node dysfunction [facility])    Past Surgical History  Procedure Laterality Date  . Right middle and lower lobectomy  1998    Dr Arlyce Dice- for lung cancer by Dr Rollene Fare  . Neck surgery  6-00    Dr Annette Stable  . Appendectomy    . Back cervical    . Right hip replacement      Dr Alvan Dame  . Foot surgery    . Cardiac catheterization    . Bilat cat surgery  8-11    Dr Ayesha Rumpf  . Colonoscopy    . Tee without cardioversion N/A 02/14/2013    Procedure: TRANSESOPHAGEAL ECHOCARDIOGRAM (TEE);  Surgeon: Lelon Perla, MD;   Location: [facility] ENDOSCOPY;  Service: Cardiovascular;  Laterality: N/A;  . Cardiac pacemaker placement    . Cardiac defibrillator placement  08/12/13    Medtronic Evera XT DR implanted by [name] for primary prevention of sudden death  . Implantable cardioverter defibrillator implant N/A 08/12/2013    Procedure: IMPLANTABLE CARDIOVERTER DEFIBRILLATOR IMPLANT;  Surgeon: Coralyn Mark, MD;  Location: [facility] CATH LAB;  Service: Cardiovascular;  Laterality: N/A;   Family History  Problem Relation Age of Onset  . Cancer Mother     breast  . Diabetes Mother   . Cancer Father     Lung  . Diabetes     Social History  Substance Use Topics  . Smoking status: Former Smoker    Quit date: 02/14/1989  . Smokeless tobacco: Never Used  . Alcohol Use: No    Review of Systems  Cardiovascular: Positive for chest pain.  All other systems reviewed and are negative.     Allergies  Atorvastatin  Home Medications   Prior to Admission medications   Medication Sig Start Date End Date Taking? Authorizing Provider  carvedilol (COREG) 6.25 MG tablet TAKE ONE TABLET TWICE DAILY WITH MEALS 01/17/15   Lelon Perla, MD  levothyroxine (SYNTHROID,  LEVOTHROID) 112 MCG tablet TAKE ONE TABLET DAILY 03/28/15   Hali Marry, MD  lisinopril (PRINIVIL,ZESTRIL) 5 MG tablet TAKE 1 TABLET DAILY. Patient not taking: Reported on 10/18/2015 02/05/15   Lelon Perla, MD  pravastatin (PRAVACHOL) 40 MG tablet Take 1 tablet (40 mg total) by mouth every evening. Patient not taking: Reported on 10/18/2015 06/14/15   Lelon Perla, MD  rivaroxaban (XARELTO) 20 MG TABS tablet Take 1 tablet (20 mg total) by mouth daily with supper. 03/27/15   Lelon Perla, MD   BP 138/83 mmHg  Pulse 79  Temp(Src) 98.2 F (36.8 C) (Oral)  Resp 12  SpO2 100% Physical Exam  Constitutional: He is oriented to person, place, and time. He appears well-developed.  HENT:  Head: Normocephalic.  Mouth/Throat: Oropharynx is clear and  moist.  Eyes: Conjunctivae are normal. Pupils are equal, round, and reactive to light.  Neck: Normal range of motion. Neck supple.  Cardiovascular: Normal rate, regular rhythm and normal heart sounds.   Pulmonary/Chest: Effort normal and breath sounds normal. No respiratory distress. He has no wheezes. He has no rales.  Abdominal: Soft. Bowel sounds are normal. He exhibits no distension. There is no tenderness. There is no rebound.  Musculoskeletal: Normal range of motion. He exhibits no edema or tenderness.  Neurological: He is alert and oriented to person, place, and time. No cranial nerve deficit. Coordination normal.  Skin: Skin is warm and dry.  Psychiatric: He has a normal mood and affect. His behavior is normal. Judgment and thought content normal.  Nursing note and vitals reviewed.   ED Course  Procedures (including critical care time) Labs Review Labs Reviewed  CBC WITH DIFFERENTIAL/PLATELET - Abnormal; Notable for the following:    Platelets 143 (*)    All other components within normal limits  COMPREHENSIVE METABOLIC PANEL - Abnormal; Notable for the following:    Glucose, Bld 105 (*)    Total Protein 6.4 (*)    Albumin 3.4 (*)    All other components within normal limits  I-STAT TROPOININ, ED  I-STAT TROPOININ, ED    Imaging Review Dg Chest 2 View  10/21/2015  CLINICAL DATA:  Per patient he started to have centeralized chest pain that started about 2:30 today for about 5 min, patient also states he was having SOB. HX right middle and lower lobectomy EXAM: CHEST  2 VIEW COMPARISON:  10/18/2015 FINDINGS: Dual lead pacer with leads at right atrium and right ventricle. Midline trachea. Normal heart size. Tortuous thoracic aorta. No left-sided pleural fluid. No pneumothorax. Volume loss in the right hemi thorax with inferior pleural parenchymal opacification and diaphragmatic elevation . This is similar and postoperative. No congestive failure. Clear left lung. IMPRESSION: No  acute cardiopulmonary disease. Similar appearance of pleural-parenchymal opacity in the inferior right hemi thorax with volume loss, postoperative. Electronically Signed   By: Abigail Miyamoto M.D.   On: 10/21/2015 15:59   I have personally reviewed and evaluated these images and lab results as part of my medical decision-making.   EKG Interpretation   Date/Time:  Sunday October 21 2015 15:23:29 EST Ventricular Rate:  88 PR Interval:  263 QRS Duration: 163 QT Interval:  401 QTC Calculation: 485 R Axis:   -90 Text Interpretation:  Sinus rhythm Prolonged PR interval Probable left  atrial enlargement RBBB and LAFB LAFB new since previous  Confirmed by YAO   MD, DAVID ([id number]) on 10/21/2015 3:33:09 PM      MDM   Final diagnoses:  None   ABHINAV MAYORQUIN is a 80 y.o. male here with chest pain. Has new EKG changes. No hx of cardiac stents but has hx of CHF, aortic stenosis, afib. Will consult cardiology.   7:50 PM I consulted Dr. Stanford Breed who reviewed EKG. Actually had previous EKG in the office that showed the same BBB and even if he has new onset LAFB, will not change management. Pain free for 4 hours. Delta trop neg. Offered admission but refused. Will dc home.     Wandra Arthurs, MD 10/21/15 [phone]

## 2015-10-21 NOTE — ED Notes (Signed)
Patient transported to X-ray 

## 2015-10-21 NOTE — ED Notes (Signed)
Per EMS, pt coming in due to non-radiating sub-sternal CP which started prior to calling EMS. PT reports that he is pain free at this time. Pt alert x4.

## 2015-10-21 NOTE — ED Notes (Signed)
Pt left with all his belongings and ambulated out of the treatment area.  

## 2015-10-21 NOTE — Discharge Instructions (Signed)
Continue your current meds.   See your heart doctor.   Return to ER if you have severe chest pain, shortness of breath,

## 2015-10-29 ENCOUNTER — Telehealth: Payer: Self-pay | Admitting: Internal Medicine

## 2015-10-29 NOTE — Telephone Encounter (Signed)
New message ° ° °Patient calling the office for samples of medication: ° ° °1.  What medication and dosage are you requesting samples for?    xarelto 20mg ° °2.  Are you currently out of this medication? no ° ° °

## 2015-10-30 ENCOUNTER — Ambulatory Visit (INDEPENDENT_AMBULATORY_CARE_PROVIDER_SITE_OTHER): Payer: Medicare Other

## 2015-10-30 ENCOUNTER — Telehealth: Payer: Self-pay | Admitting: Cardiology

## 2015-10-30 DIAGNOSIS — I4891 Unspecified atrial fibrillation: Secondary | ICD-10-CM

## 2015-10-30 DIAGNOSIS — Z95810 Presence of automatic (implantable) cardiac defibrillator: Secondary | ICD-10-CM | POA: Diagnosis not present

## 2015-10-30 DIAGNOSIS — I5022 Chronic systolic (congestive) heart failure: Secondary | ICD-10-CM | POA: Diagnosis not present

## 2015-10-30 MED ORDER — RIVAROXABAN 20 MG PO TABS: 20.0000 mg | ORAL_TABLET | Freq: Every day | ORAL | Status: DC

## 2015-10-30 NOTE — Telephone Encounter (Signed)
Patient calling the office for samples of medication:   1.  What medication and dosage are you requesting samples for?Xarelto  2.  Are you currently out of this medication? No,just enough for this week

## 2015-10-30 NOTE — Telephone Encounter (Signed)
called pt and told him we did not have samples, told him to call and check back on friday, he is in agreement with plan.

## 2015-10-30 NOTE — Telephone Encounter (Signed)
Patient aware samples are at the front desk for pick up  

## 2015-10-31 ENCOUNTER — Telehealth: Payer: Self-pay

## 2015-10-31 NOTE — Progress Notes (Signed)
EPIC Encounter for ICM Monitoring  Patient Name: Steven Everett is a 80 y.o. male Date: 10/31/2015 Primary Care Physican: Beatrice Lecher, MD Primary Cardiologist: Stanford Breed Electrophysiologist: Allred Dry Weight: 186 lb       In the past month, have you:  1. Gained more than 2 pounds in a day or more than 5 pounds in a week? no  2. Had changes in your medications (with verification of current medications)? no  3. Had more shortness of breath than is usual for you? no  4. Limited your activity because of shortness of breath? no  5. Not been able to sleep because of shortness of breath? no  6. Had increased swelling in your feet or ankles? no  7. Had symptoms of dehydration (dizziness, dry mouth, increased thirst, decreased urine output) no  8. Had changes in sodium restriction? no  9. Been compliant with medication? Yes   ICM trend:   Follow-up plan: ICM clinic phone appointment ion 0/01/2016.  Optivol thoracic impedance above baseline ~10/27/2015 to 10/30/2015 suggesting fluid retention.  He reported he went to ER on 10/21/2015 for chest pain but nothing was found. He stated the pain actually stopped by the time EMS arrived but stated he should be checked.  He reported he is about 1 pound above goal weight.  He stated the fluid is probably from eating holiday meal.   Education given to limit sodium intake and call if he experiences any HF symptoms. No changes today.   Copy of note sent to patient's primary care physician, primary cardiologist, and device following physician.  Rosalene Billings, RN, CCM 10/31/2015 1:51 PM

## 2015-10-31 NOTE — Telephone Encounter (Signed)
ICM transmission received.  Attempted call and left message for return call.  

## 2015-11-19 ENCOUNTER — Telehealth: Payer: Self-pay | Admitting: Internal Medicine

## 2015-11-19 NOTE — Telephone Encounter (Signed)
New message ° ° °Patient calling the office for samples of medication: ° ° °1.  What medication and dosage are you requesting samples for?    xarelto 20mg ° °2.  Are you currently out of this medication? no ° ° °

## 2015-11-20 ENCOUNTER — Other Ambulatory Visit: Payer: Self-pay | Admitting: Cardiology

## 2015-11-20 NOTE — Telephone Encounter (Signed)
Called pt to inform him that we did not have any samples of xarelto 20 mg tablets. I advised the pt that he could call back tomorrow to see if we have gotten any in. Pt verbalized understanding.

## 2015-11-20 NOTE — Telephone Encounter (Signed)
Called pt to inform pt that we do not have any samples of xarelto 20 mg tablets and that he was welcomed to call back tomorrow to see if we have gotten any samples in. Pt verbalized understanding.

## 2015-11-20 NOTE — Telephone Encounter (Signed)
Patient calling the office for samples of medication:   1.  What medication and dosage are you requesting samples for? Xarelto-pt coming to [place] today,he would like to pick them up while he is in [place]   2.  Are you currently out of this medication? No,3 left

## 2015-11-20 NOTE — Telephone Encounter (Signed)
Follow up     Patient calling the office for samples of medication:   1.  What medication and dosage are you requesting samples for? xarelto 20 mg   2.  Are you currently out of this medication? Only 2 more days left

## 2015-11-20 NOTE — Telephone Encounter (Signed)
Pt aware no samples on-hand today. States he will call back tomorrow or Thursday to recheck.

## 2015-11-21 ENCOUNTER — Telehealth: Payer: Self-pay | Admitting: *Deleted

## 2015-11-21 ENCOUNTER — Telehealth: Payer: Self-pay | Admitting: Internal Medicine

## 2015-11-21 NOTE — Telephone Encounter (Signed)
Patient calling the office for samples of medication:   1.  What medication and dosage are you requesting samples for?   XARELTO   '30MG'$   2.  Are you currently out of this medication? 2 LEFT

## 2015-11-21 NOTE — Telephone Encounter (Signed)
LVM for pt to pick up samples.

## 2015-11-22 ENCOUNTER — Telehealth: Payer: Self-pay | Admitting: Cardiology

## 2015-11-22 NOTE — Telephone Encounter (Signed)
Left message for pt to call.

## 2015-11-22 NOTE — Telephone Encounter (Signed)
Spoke with pt, Aware of dr Jacalyn Lefevre recommendations.  He will try saline nasal spray or humidifier to help with mosture. He will call with sont problems

## 2015-11-22 NOTE — Telephone Encounter (Signed)
Pt says he nose have been bleeding for the last 3 days. It is not getting any better,wonder if it might be the Xarelto.

## 2015-11-22 NOTE — Telephone Encounter (Signed)
Returned call to patient.He stated for the past 3 days when he blows nose in morning his nose bleeds.He wants to know if xarelto is causing.Message sent to [facility] for advice.

## 2015-11-22 NOTE — Telephone Encounter (Signed)
xarelto likely contributing; continue unless nosebleed persists or becomes severe. Kirk Ruths

## 2015-11-28 ENCOUNTER — Other Ambulatory Visit: Payer: Self-pay | Admitting: Cardiology

## 2015-11-28 NOTE — Telephone Encounter (Signed)
REFILL 

## 2015-12-04 ENCOUNTER — Ambulatory Visit (INDEPENDENT_AMBULATORY_CARE_PROVIDER_SITE_OTHER): Payer: Medicare Other

## 2015-12-04 ENCOUNTER — Telehealth: Payer: Self-pay | Admitting: Internal Medicine

## 2015-12-04 DIAGNOSIS — Z95810 Presence of automatic (implantable) cardiac defibrillator: Secondary | ICD-10-CM | POA: Diagnosis not present

## 2015-12-04 DIAGNOSIS — I5022 Chronic systolic (congestive) heart failure: Secondary | ICD-10-CM | POA: Diagnosis not present

## 2015-12-04 NOTE — Telephone Encounter (Signed)
Informed patient that remote was received. Patient voiced understanding. 

## 2015-12-04 NOTE — Telephone Encounter (Signed)
New message ° ° ° ° °Did we get his remote transmission report? °

## 2015-12-04 NOTE — Telephone Encounter (Signed)
Informed patient that remote was not received. Patient states that he will try again. 800# given just in case pt runs into problems. Patient voiced understanding.

## 2015-12-05 ENCOUNTER — Telehealth: Payer: Self-pay

## 2015-12-05 NOTE — Telephone Encounter (Signed)
Spoke with patient.

## 2015-12-05 NOTE — Telephone Encounter (Signed)
ICM transmission received.  Attempted ICM call and left message for return call.

## 2015-12-05 NOTE — Progress Notes (Signed)
EPIC Encounter for ICM Monitoring  Patient Name: Steven Everett is a 81 y.o. male Date: 12/05/2015 Primary Care Physican: Beatrice Lecher, MD Primary Cardiologist: Stanford Breed Electrophysiologist: Allred Dry Weight: 186 lbs       In the past month, have you:  1. Gained more than 2 pounds in a day or more than 5 pounds in a week? no  2. Had changes in your medications (with verification of current medications)? no  3. Had more shortness of breath than is usual for you? no  4. Limited your activity because of shortness of breath? no  5. Not been able to sleep because of shortness of breath? no  6. Had increased swelling in your feet or ankles? no  7. Had symptoms of dehydration (dizziness, dry mouth, increased thirst, decreased urine output) no  8. Had changes in sodium restriction? no  9. Been compliant with medication? Yes   ICM trend: 1 year view - 12/04/2015  ICM Trend: 3 month view 12/04/2015  Follow-up plan: ICM clinic phone appointment on 01/31/2015 and in office check with [name] on 12/31/2015.  [place] 10/29/2015 to 11/07/2015 and 11/17/2015 to 11/22/2015, 11/27/2015 to 11/29/2015 suggesting fluid retention.  He denied any symptoms and thinks the fluid retention is related eating holiday foods.  He reported he has resumed his low salt diet.   Encouraged him to call should he have any fluid symptoms.  No changes today.   Copy of note sent to patient's primary care physician, primary cardiologist, and device following physician.  Rosalene Billings, RN, CCM 12/05/2015 2:08 PM

## 2015-12-11 ENCOUNTER — Telehealth: Payer: Self-pay | Admitting: Internal Medicine

## 2015-12-11 ENCOUNTER — Telehealth: Payer: Self-pay | Admitting: Cardiology

## 2015-12-11 NOTE — Telephone Encounter (Signed)
New message       Patient calling the office for samples of medication:   1.  What medication and dosage are you requesting samples for?  xarelto '20mg'$   2.  Are you currently out of this medication? Last pill will be taken on Friday.  Pt will be in [place] on thurs and can pick up samples at that time

## 2015-12-11 NOTE — Telephone Encounter (Signed)
Patient calling the office for samples of medication:   1.  What medication and dosage are you requesting samples for? Xarelto-would you take to [facility] tomorrow please.  2.  Are you currently out of this medication? Enough until Friday

## 2015-12-11 NOTE — Telephone Encounter (Signed)
Spoke with pt, aware will take samples to [facility] tomorrow.

## 2015-12-12 ENCOUNTER — Other Ambulatory Visit: Payer: Self-pay | Admitting: Family Medicine

## 2015-12-12 NOTE — Telephone Encounter (Signed)
Called patient to let him know that we had samples available for him at the front desk.

## 2015-12-31 ENCOUNTER — Ambulatory Visit (INDEPENDENT_AMBULATORY_CARE_PROVIDER_SITE_OTHER): Payer: Medicare Other | Admitting: Internal Medicine

## 2015-12-31 ENCOUNTER — Encounter: Payer: Self-pay | Admitting: Internal Medicine

## 2015-12-31 VITALS — BP 150/82 | HR 80 | Ht 70.0 in | Wt 189.8 lb

## 2015-12-31 DIAGNOSIS — Z95810 Presence of automatic (implantable) cardiac defibrillator: Secondary | ICD-10-CM | POA: Diagnosis not present

## 2015-12-31 DIAGNOSIS — I5022 Chronic systolic (congestive) heart failure: Secondary | ICD-10-CM | POA: Diagnosis not present

## 2015-12-31 DIAGNOSIS — I480 Paroxysmal atrial fibrillation: Secondary | ICD-10-CM

## 2015-12-31 DIAGNOSIS — I4891 Unspecified atrial fibrillation: Secondary | ICD-10-CM | POA: Diagnosis not present

## 2015-12-31 DIAGNOSIS — I350 Nonrheumatic aortic (valve) stenosis: Secondary | ICD-10-CM

## 2015-12-31 NOTE — Patient Instructions (Addendum)
Medication Instructions:  Your physician recommends that you continue on your current medications as directed. Please refer to the Current Medication list given to you today.   Labwork: None ordered   Testing/Procedures: Your physician has requested that you have an echocardiogram. Echocardiography is a painless test that uses sound waves to create images of your heart. It provides your doctor with information about the size and shape of your heart and how well your heart's chambers and valves are working. This procedure takes approximately one hour. There are no restrictions for this procedure.   Your physician has requested that you have a TEE. During a TEE, sound waves are used to create images of your heart. It provides your doctor with information about the size and shape of your heart and how well your heart's chambers and valves are working. In this test, a transducer is attached to the end of a flexible tube that's guided down your throat and into your esophagus (the tube leading from you mouth to your stomach) to get a more detailed image of your heart. You are not awake for the procedure. Please see the instruction sheet given to you today. For further information please visit [email or web address] for 01/09/16.  Please check in at the [facility] at 7:30am.  Do not eat or drink after midnight the night before and you will need someone to drive you home after procedure     Follow-up:  Your physician recommends that you schedule a follow-up appointment in: 2-3 weeks with Chanetta Marshall, NP   Remote monitoring is used to monitor your  ICD from home. This monitoring reduces the number of office visits required to check your device to one time per year. It allows [place] to keep an eye on the functioning of your device to ensure it is working properly. You are scheduled for a device check from home on 03/31/16. You may send your transmission at any time that  day. If you have a wireless device, the transmission will be sent automatically. After your physician reviews your transmission, you will receive a postcard with your next transmission date.   Your physician wants you to follow-up in: 12  Months with Dr Vallery Ridge will receive a reminder letter in the mail two months in advance. If you don't receive a letter, please call our office to schedule the follow-up appointment.     Any Other Special Instructions Will Be Listed Below (If Applicable).     If you need a refill on your cardiac medications before your next appointment, please call your pharmacy.

## 2015-12-31 NOTE — Progress Notes (Signed)
PCP:  Beatrice Lecher, MD Primary Cardiologist:  Dr Stanford Breed  The patient presents today for routine electrophysiology followup.  Since having his last visit, the patient reports doing very well.  He seems mostly asymptomatic with his AS.  He is very concerned about frequent bruising and bleeding with xarelto.  He finds that this significant limits his activity.  He is very afraid to be active due to bleeding concerns. Today, he denies symptoms of palpitations, chest pain, shortness of breath, orthopnea, PND, lower extremity edema, dizziness, presyncope, syncope, or neurologic sequela.  The patient feels that he is tolerating medications without difficulties and is otherwise without complaint today.   Past Medical History  Diagnosis Date  . Hemorrhoids, internal   . Diverticulosis of colon   . Barrett's esophagus   . Cirrhosis of liver (HCC)     w/o mention of alcohol  . Cancer ([place])     lung, lower lobe  . Seborrheic dermatitis   . Hyperlipidemia   . Hypothyroidism   . Hypertension   . GERD (gastroesophageal reflux disease)   . Diabetes mellitus     type 2  . Rheumatic fever   . Aortic stenosis   . Paroxysmal atrial fibrillation ([place]) 12/14/13    detected on ICD interrogation  . Ischemic cardiomyopathy   . Chronic systolic dysfunction of left ventricle   . Sinus node dysfunction [facility])    Past Surgical History  Procedure Laterality Date  . Right middle and lower lobectomy  1998    Dr Arlyce Dice- for lung cancer by Dr Rollene Fare  . Neck surgery  6-00    Dr Annette Stable  . Appendectomy    . Back cervical    . Right hip replacement      Dr Alvan Dame  . Foot surgery    . Cardiac catheterization    . Bilat cat surgery  8-11    Dr Ayesha Rumpf  . Colonoscopy    . Tee without cardioversion N/A 02/14/2013    Procedure: TRANSESOPHAGEAL ECHOCARDIOGRAM (TEE);  Surgeon: Lelon Perla, MD;  Location: [facility] ENDOSCOPY;  Service: Cardiovascular;  Laterality: N/A;  . Cardiac pacemaker placement    . Cardiac  defibrillator placement  08/12/13    Medtronic Evera XT DR implanted by [name] for primary prevention of sudden death  . Implantable cardioverter defibrillator implant N/A 08/12/2013    Procedure: IMPLANTABLE CARDIOVERTER DEFIBRILLATOR IMPLANT;  Surgeon: Coralyn Mark, MD;  Location: [facility] CATH LAB;  Service: Cardiovascular;  Laterality: N/A;    Current Outpatient Prescriptions  Medication Sig Dispense Refill  . carvedilol (COREG) 6.25 MG tablet TAKE ONE TABLET TWICE DAILY WITH MEALS 60 tablet 2  . levothyroxine (SYNTHROID, LEVOTHROID) 112 MCG tablet Take 1 tablet (112 mcg total) by mouth daily. APPOINTMENT NEEDED FOR FURTHER REFILLS 30 tablet 1  . lisinopril (PRINIVIL,ZESTRIL) 5 MG tablet TAKE 1 TABLET DAILY. 30 tablet 10  . pravastatin (PRAVACHOL) 40 MG tablet Take 1 tablet (40 mg total) by mouth every evening. 30 tablet 5  . rivaroxaban (XARELTO) 20 MG TABS tablet Take 1 tablet (20 mg total) by mouth daily with supper. 20 tablet 0   No current facility-administered medications for this visit.    Allergies  Allergen Reactions  . Atorvastatin     REACTION: joint aches    Social History   Social History  . Marital Status: Widowed    Spouse Name: Hoyle Sauer  . Number of Children: 1  . Years of Education: N/A   Occupational History  .  Social History Main Topics  . Smoking status: Former Smoker    Quit date: 02/14/1989  . Smokeless tobacco: Never Used  . Alcohol Use: No  . Drug Use: No  . Sexual Activity: Not on file   Other Topics Concern  . Not on file   Social History Narrative    Family History  Problem Relation Age of Onset  . Cancer Mother     breast  . Diabetes Mother   . Cancer Father     Lung  . Diabetes      ROS-  All systems are reviewed and are negative except as outlined in the HPI above  Physical Exam: Filed Vitals:   12/31/15 1551  BP: 150/82  Pulse: 80  Height: '5\' 10"'$  (1.778 m)  Weight: 189 lb 12.8 oz (86.093 kg)    GEN- The patient is  well appearing, alert and oriented x 3 today.   Head- normocephalic, atraumatic Eyes-  Sclera clear, conjunctiva pink Ears- hearing intact Oropharynx- clear Neck- supple,  Lungs- Clear to ausculation bilaterally, normal work of breathing Heart- Regular rate and rhythm, 2/6 SEM LUSB, S2 is very audible GI- soft, NT, ND, + BS Extremities- no clubbing, cyanosis, or edema MS- no significant deformity or atrophy Skin- ecchymosis over both hands Psych- euthymic mood, full affect Neuro- strength and sensation are intact  ICD interrogation today is reviewed and normal  Assessment and Plan:  1. The patient has a mixed (predominantly nonischemi) CM (EF 30%), NYHA Class II CHF, and CAD.    No ischemic symptoms No CHF symptoms today Normal ICD function See Pace Art report No changes today Followed in ICM device clinic  2. Sick sinus syndrome No further syncope or pauses  3. HTN Stable No change required today  4. afib Chads2vasc score is at least 5.  He does not feel that he is tolerating xarelto well due to frequent bruising and bleeding (skin).  He has reduced his activity due to this and is very concerned about bleeding risks.  He does not feel that he could take coumadin due to bleeding concerns and is not willing to make this change presently.  Procedural risks for the Watchman implant have been reviewed with the patient including a 1% risk of stroke, 2% risk of perforation, 0.1% risk of device embolization.  Given the patient's poor candidacy for long-term oral anticoagulation, ability to tolerate short term oral anticoagulation, I have recommended the watchman left atrial appendage closure system.  TEE will be scheduled to review LAA anatomy.  The patient understands that the ability to implant Watchman is dependent on results of the TEE.  If patient is candidate for Watchman based on TEE results, we will schedule the procedure at the next available time.  He will also discuss this  with Dr Stanford Breed when they meet in a few weeks  5. AS Not severe by exam Will repeat echo as per Dr Jacalyn Lefevre last note  [facility]link   Follow-up with EP NP Follow-up with Dr Stanford Breed as scheduled  Thompson Grayer MD, [facility] 12/31/2015 5:25 PM

## 2016-01-09 ENCOUNTER — Ambulatory Visit (HOSPITAL_BASED_OUTPATIENT_CLINIC_OR_DEPARTMENT_OTHER): Payer: Medicare Other

## 2016-01-09 ENCOUNTER — Encounter (HOSPITAL_COMMUNITY): Payer: Self-pay | Admitting: *Deleted

## 2016-01-09 ENCOUNTER — Encounter (HOSPITAL_COMMUNITY)
Admission: RE | Disposition: A | Discharge status: Home / Self Care | Payer: Self-pay | Source: Ambulatory Visit | Attending: Cardiology

## 2016-01-09 ENCOUNTER — Ambulatory Visit (HOSPITAL_COMMUNITY)
Admission: RE | Admit: 2016-01-09 | Discharge: 2016-01-09 | Disposition: A | Discharge status: Home / Self Care | Payer: Medicare Other | Source: Ambulatory Visit | Attending: Cardiology | Admitting: Cardiology

## 2016-01-09 DIAGNOSIS — E785 Hyperlipidemia, unspecified: Secondary | ICD-10-CM | POA: Diagnosis not present

## 2016-01-09 DIAGNOSIS — I10 Essential (primary) hypertension: Secondary | ICD-10-CM | POA: Diagnosis not present

## 2016-01-09 DIAGNOSIS — I495 Sick sinus syndrome: Secondary | ICD-10-CM | POA: Diagnosis not present

## 2016-01-09 DIAGNOSIS — I2510 Atherosclerotic heart disease of native coronary artery without angina pectoris: Secondary | ICD-10-CM | POA: Diagnosis not present

## 2016-01-09 DIAGNOSIS — I4891 Unspecified atrial fibrillation: Secondary | ICD-10-CM | POA: Diagnosis not present

## 2016-01-09 DIAGNOSIS — Z96641 Presence of right artificial hip joint: Secondary | ICD-10-CM | POA: Insufficient documentation

## 2016-01-09 DIAGNOSIS — E119 Type 2 diabetes mellitus without complications: Secondary | ICD-10-CM | POA: Diagnosis not present

## 2016-01-09 DIAGNOSIS — Z87891 Personal history of nicotine dependence: Secondary | ICD-10-CM | POA: Diagnosis not present

## 2016-01-09 DIAGNOSIS — E039 Hypothyroidism, unspecified: Secondary | ICD-10-CM | POA: Diagnosis not present

## 2016-01-09 DIAGNOSIS — Z79899 Other long term (current) drug therapy: Secondary | ICD-10-CM | POA: Diagnosis not present

## 2016-01-09 DIAGNOSIS — K7460 Unspecified cirrhosis of liver: Secondary | ICD-10-CM | POA: Diagnosis not present

## 2016-01-09 DIAGNOSIS — Z95810 Presence of automatic (implantable) cardiac defibrillator: Secondary | ICD-10-CM | POA: Diagnosis not present

## 2016-01-09 DIAGNOSIS — I255 Ischemic cardiomyopathy: Secondary | ICD-10-CM | POA: Diagnosis not present

## 2016-01-09 DIAGNOSIS — I480 Paroxysmal atrial fibrillation: Secondary | ICD-10-CM | POA: Diagnosis not present

## 2016-01-09 DIAGNOSIS — I080 Rheumatic disorders of both mitral and aortic valves: Secondary | ICD-10-CM | POA: Insufficient documentation

## 2016-01-09 DIAGNOSIS — I340 Nonrheumatic mitral (valve) insufficiency: Secondary | ICD-10-CM

## 2016-01-09 DIAGNOSIS — K219 Gastro-esophageal reflux disease without esophagitis: Secondary | ICD-10-CM | POA: Diagnosis not present

## 2016-01-09 DIAGNOSIS — Z7901 Long term (current) use of anticoagulants: Secondary | ICD-10-CM | POA: Diagnosis not present

## 2016-01-09 HISTORY — PX: TEE WITHOUT CARDIOVERSION: SHX5443

## 2016-01-09 SURGERY — ECHOCARDIOGRAM, TRANSESOPHAGEAL
Anesthesia: Moderate Sedation

## 2016-01-09 MED ORDER — FENTANYL CITRATE (PF) 100 MCG/2ML IJ SOLN
INTRAMUSCULAR | Status: AC
  Filled 2016-01-09: qty 2

## 2016-01-09 MED ORDER — MIDAZOLAM HCL 5 MG/ML IJ SOLN
INTRAMUSCULAR | Status: AC
  Filled 2016-01-09: qty 2

## 2016-01-09 MED ORDER — FENTANYL CITRATE (PF) 100 MCG/2ML IJ SOLN
INTRAMUSCULAR | Status: DC | PRN
  Administered 2016-01-09 (×2): 25 ug via INTRAVENOUS

## 2016-01-09 MED ORDER — MIDAZOLAM HCL 10 MG/2ML IJ SOLN
INTRAMUSCULAR | Status: DC | PRN
  Administered 2016-01-09: 2 mg via INTRAVENOUS
  Administered 2016-01-09: 1 mg via INTRAVENOUS
  Administered 2016-01-09: 2 mg via INTRAVENOUS

## 2016-01-09 MED ORDER — SODIUM CHLORIDE 0.9 % IV SOLN: INTRAVENOUS | Status: DC

## 2016-01-09 MED ORDER — BUTAMBEN-TETRACAINE-BENZOCAINE 2-2-14 % EX AERO
INHALATION_SPRAY | CUTANEOUS | Status: DC | PRN
  Administered 2016-01-09: 2 via TOPICAL

## 2016-01-09 NOTE — CV Procedure (Signed)
TEE:  During this procedure the patient is administered a total of Versed 4 mg and Fentanyl 25 mg to achieve and maintain moderate conscious sedation.  The patient's heart rate, blood pressure, and oxygen saturation are monitored continuously during the procedure. The period of conscious sedation is 39 minutes, of which I was present face-to-face 100% of this time.  EF 40% Calcified Aortic valve Area 1.1 cm2 planimetry mild to mod AS mild AR Mild MR No LAA clot Measurements for Watchman see [employer]tronics Atrial septal aneurysm with no PFO and negative bubble AICD wires in RA/RV with mild TR Moderate mural aortic debris  Jenkins Rouge

## 2016-01-09 NOTE — Progress Notes (Signed)
*  PRELIMINARY RESULTS* Echocardiogram Echocardiogram Transesophageal has been performed.  Leavy Cella 01/09/2016, 9:40 AM

## 2016-01-09 NOTE — Discharge Instructions (Signed)
Moderate Conscious Sedation, Adult, Care After Refer to this sheet in the next few weeks. These instructions provide you with information on caring for yourself after your procedure. Your health care provider may also give you more specific instructions. Your treatment has been planned according to current medical practices, but problems sometimes occur. Call your health care provider if you have any problems or questions after your procedure. WHAT TO EXPECT AFTER THE PROCEDURE  After your procedure:  You may feel sleepy, clumsy, and have poor balance for several hours.  Vomiting may occur if you eat too soon after the procedure. HOME CARE INSTRUCTIONS  Do not participate in any activities where you could become injured for at least 24 hours. Do not:  Drive.  Swim.  Ride a bicycle.  Operate heavy machinery.  Cook.  Use power tools.  Climb ladders.  Work from a high place.  Do not make important decisions or sign legal documents until you are improved.  If you vomit, drink water, juice, or soup when you can drink without vomiting. Make sure you have little or no nausea before eating solid foods.  Only take over-the-counter or prescription medicines for pain, discomfort, or fever as directed by your health care provider.  Make sure you and your family fully understand everything about the medicines given to you, including what side effects may occur.  You should not drink alcohol, take sleeping pills, or take medicines that cause drowsiness for at least 24 hours.  If you smoke, do not smoke without supervision.  If you are feeling better, you may resume normal activities 24 hours after you were sedated.  Keep all appointments with your health care provider. SEEK MEDICAL CARE IF:  Your skin is pale or bluish in color.  You continue to feel nauseous or vomit.  Your pain is getting worse and is not helped by medicine.  You have bleeding or swelling.  You are still  sleepy or feeling clumsy after 24 hours. SEEK IMMEDIATE MEDICAL CARE IF:  You develop a rash.  You have difficulty breathing.  You develop any type of allergic problem.  You have a fever. MAKE SURE YOU:  Understand these instructions.  Will watch your condition.  Will get help right away if you are not doing well or get worse.   This information is not intended to replace advice given to you by your health care provider. Make sure you discuss any questions you have with your health care provider.   Document Released: 09/07/2013 Document Revised: 12/08/2014 Document Reviewed: 09/07/2013 Elsevier Interactive Patient Education 2016 [place]. Transesophageal Echocardiogram Transesophageal echocardiography (TEE) is a picture test of your heart using sound waves. The pictures taken can give very detailed pictures of your heart. This can help your doctor see if there are problems with your heart. TEE can check:  If your heart has blood clots in it.  How well your heart valves are working.  If you have an infection on the inside of your heart.  Some of the major arteries of your heart.  If your heart valve is working after a Office manager.  Your heart before a procedure that uses a shock to your heart to get the rhythm back to normal. BEFORE THE PROCEDURE  Do not eat or drink for 6 hours before the procedure or as told by your doctor.  Make plans to have someone drive you home after the procedure. Do not drive yourself home.  An IV tube will be put in  your arm. PROCEDURE  You will be given a medicine to help you relax (sedative). It will be given through the IV tube.  A numbing medicine will be sprayed or gargled in the back of your throat to help numb it.  The tip of the probe is placed into the back of your mouth. You will be asked to swallow. This helps to pass the probe into your esophagus.  Once the tip of the probe is in the right place, your doctor can take pictures of  your heart.  You may feel pressure at the back of your throat. AFTER THE PROCEDURE  You will be taken to a recovery area so the sedative can wear off.  Your throat may be sore and scratchy. This will go away slowly over time.  You will go home when you are fully awake and able to swallow liquids.  You should have someone stay with you for the next 24 hours.  Do not drive or operate machinery for the next 24 hours.   This information is not intended to replace advice given to you by your health care provider. Make sure you discuss any questions you have with your health care provider.   Document Released: 09/14/2009 Document Revised: 11/22/2013 Document Reviewed: 05/19/2013 Elsevier Interactive Patient Education [employer].

## 2016-01-09 NOTE — H&P (View-Only) (Signed)
PCP:  Beatrice Lecher, MD Primary Cardiologist:  Dr Stanford Breed  The patient presents today for routine electrophysiology followup.  Since having his last visit, the patient reports doing very well.  He seems mostly asymptomatic with his AS.  He is very concerned about frequent bruising and bleeding with xarelto.  He finds that this significant limits his activity.  He is very afraid to be active due to bleeding concerns. Today, he denies symptoms of palpitations, chest pain, shortness of breath, orthopnea, PND, lower extremity edema, dizziness, presyncope, syncope, or neurologic sequela.  The patient feels that he is tolerating medications without difficulties and is otherwise without complaint today.   Past Medical History  Diagnosis Date  . Hemorrhoids, internal   . Diverticulosis of colon   . Barrett's esophagus   . Cirrhosis of liver (HCC)     w/o mention of alcohol  . Cancer ([place])     lung, lower lobe  . Seborrheic dermatitis   . Hyperlipidemia   . Hypothyroidism   . Hypertension   . GERD (gastroesophageal reflux disease)   . Diabetes mellitus     type 2  . Rheumatic fever   . Aortic stenosis   . Paroxysmal atrial fibrillation ([place]) 12/14/13    detected on ICD interrogation  . Ischemic cardiomyopathy   . Chronic systolic dysfunction of left ventricle   . Sinus node dysfunction [facility])    Past Surgical History  Procedure Laterality Date  . Right middle and lower lobectomy  1998    Dr Arlyce Dice- for lung cancer by Dr Rollene Fare  . Neck surgery  6-00    Dr Annette Stable  . Appendectomy    . Back cervical    . Right hip replacement      Dr Alvan Dame  . Foot surgery    . Cardiac catheterization    . Bilat cat surgery  8-11    Dr Ayesha Rumpf  . Colonoscopy    . Tee without cardioversion N/A 02/14/2013    Procedure: TRANSESOPHAGEAL ECHOCARDIOGRAM (TEE);  Surgeon: Lelon Perla, MD;  Location: [facility] ENDOSCOPY;  Service: Cardiovascular;  Laterality: N/A;  . Cardiac pacemaker placement    . Cardiac  defibrillator placement  08/12/13    Medtronic Evera XT DR implanted by [name] for primary prevention of sudden death  . Implantable cardioverter defibrillator implant N/A 08/12/2013    Procedure: IMPLANTABLE CARDIOVERTER DEFIBRILLATOR IMPLANT;  Surgeon: Coralyn Mark, MD;  Location: [facility] CATH LAB;  Service: Cardiovascular;  Laterality: N/A;    Current Outpatient Prescriptions  Medication Sig Dispense Refill  . carvedilol (COREG) 6.25 MG tablet TAKE ONE TABLET TWICE DAILY WITH MEALS 60 tablet 2  . levothyroxine (SYNTHROID, LEVOTHROID) 112 MCG tablet Take 1 tablet (112 mcg total) by mouth daily. APPOINTMENT NEEDED FOR FURTHER REFILLS 30 tablet 1  . lisinopril (PRINIVIL,ZESTRIL) 5 MG tablet TAKE 1 TABLET DAILY. 30 tablet 10  . pravastatin (PRAVACHOL) 40 MG tablet Take 1 tablet (40 mg total) by mouth every evening. 30 tablet 5  . rivaroxaban (XARELTO) 20 MG TABS tablet Take 1 tablet (20 mg total) by mouth daily with supper. 20 tablet 0   No current facility-administered medications for this visit.    Allergies  Allergen Reactions  . Atorvastatin     REACTION: joint aches    Social History   Social History  . Marital Status: Widowed    Spouse Name: Hoyle Sauer  . Number of Children: 1  . Years of Education: N/A   Occupational History  .  Social History Main Topics  . Smoking status: Former Smoker    Quit date: 02/14/1989  . Smokeless tobacco: Never Used  . Alcohol Use: No  . Drug Use: No  . Sexual Activity: Not on file   Other Topics Concern  . Not on file   Social History Narrative    Family History  Problem Relation Age of Onset  . Cancer Mother     breast  . Diabetes Mother   . Cancer Father     Lung  . Diabetes      ROS-  All systems are reviewed and are negative except as outlined in the HPI above  Physical Exam: Filed Vitals:   12/31/15 1551  BP: 150/82  Pulse: 80  Height: '5\' 10"'$  (1.778 m)  Weight: 189 lb 12.8 oz (86.093 kg)    GEN- The patient is  well appearing, alert and oriented x 3 today.   Head- normocephalic, atraumatic Eyes-  Sclera clear, conjunctiva pink Ears- hearing intact Oropharynx- clear Neck- supple,  Lungs- Clear to ausculation bilaterally, normal work of breathing Heart- Regular rate and rhythm, 2/6 SEM LUSB, S2 is very audible GI- soft, NT, ND, + BS Extremities- no clubbing, cyanosis, or edema MS- no significant deformity or atrophy Skin- ecchymosis over both hands Psych- euthymic mood, full affect Neuro- strength and sensation are intact  ICD interrogation today is reviewed and normal  Assessment and Plan:  1. The patient has a mixed (predominantly nonischemi) CM (EF 30%), NYHA Class II CHF, and CAD.    No ischemic symptoms No CHF symptoms today Normal ICD function See Pace Art report No changes today Followed in ICM device clinic  2. Sick sinus syndrome No further syncope or pauses  3. HTN Stable No change required today  4. afib Chads2vasc score is at least 5.  He does not feel that he is tolerating xarelto well due to frequent bruising and bleeding (skin).  He has reduced his activity due to this and is very concerned about bleeding risks.  He does not feel that he could take coumadin due to bleeding concerns and is not willing to make this change presently.  Procedural risks for the Watchman implant have been reviewed with the patient including a 1% risk of stroke, 2% risk of perforation, 0.1% risk of device embolization.  Given the patient's poor candidacy for long-term oral anticoagulation, ability to tolerate short term oral anticoagulation, I have recommended the watchman left atrial appendage closure system.  TEE will be scheduled to review LAA anatomy.  The patient understands that the ability to implant Watchman is dependent on results of the TEE.  If patient is candidate for Watchman based on TEE results, we will schedule the procedure at the next available time.  He will also discuss this  with Dr Stanford Breed when they meet in a few weeks  5. AS Not severe by exam Will repeat echo as per Dr Jacalyn Lefevre last note  [facility]   Follow-up with EP NP Follow-up with Dr Stanford Breed as scheduled  Thompson Grayer MD, [facility] 12/31/2015 5:25 PM

## 2016-01-09 NOTE — Interval H&P Note (Signed)
History and Physical Interval Note:  01/09/2016 8:09 AM  Steven Everett  has presented today for surgery, with the diagnosis of AFIB  The various methods of treatment have been discussed with the patient and family. After consideration of risks, benefits and other options for treatment, the patient has consented to  Procedure(s): TRANSESOPHAGEAL ECHOCARDIOGRAM (TEE) (N/A) as a surgical intervention .  The patient's history has been reviewed, patient examined, no change in status, stable for surgery.  I have reviewed the patient's chart and labs.  Questions were answered to the patient's satisfaction.     Jenkins Rouge

## 2016-01-10 ENCOUNTER — Encounter (HOSPITAL_COMMUNITY): Payer: Self-pay | Admitting: Cardiovascular Disease

## 2016-01-10 ENCOUNTER — Ambulatory Visit (HOSPITAL_COMMUNITY): Payer: Medicare Other | Attending: Cardiology

## 2016-01-10 ENCOUNTER — Other Ambulatory Visit: Payer: Self-pay

## 2016-01-10 DIAGNOSIS — I350 Nonrheumatic aortic (valve) stenosis: Secondary | ICD-10-CM | POA: Diagnosis not present

## 2016-01-10 DIAGNOSIS — I517 Cardiomegaly: Secondary | ICD-10-CM | POA: Insufficient documentation

## 2016-01-10 DIAGNOSIS — E785 Hyperlipidemia, unspecified: Secondary | ICD-10-CM | POA: Diagnosis not present

## 2016-01-10 NOTE — CV Procedure (Signed)
[facility]   TRANSESOPHAGEAL ECHOCARDIOGRAM (TEE) NOTE  - WATCHMAN ATRIAL APPENDAGE OCCLUDER DEVICE EVALUATION    INDICATIONS: atrial fibrillation  PROCEDURE:   Informed consent was obtained prior to the procedure. The risks, benefits and alternatives for the procedure were discussed and the patient comprehended these risks.  Risks include, but are not limited to, cough, sore throat, vomiting, nausea, somnolence, esophageal and stomach trauma or perforation, bleeding, low blood pressure, aspiration, pneumonia, infection, trauma to the teeth and death.    After a procedural time-out, the patient was given 4 mg versed and 50 mcg fentanyl for moderate sedation.  The oropharynx was anesthetized 10 cc of topical 1% viscous lidocaine.  The transesophageal probe was inserted in the esophagus and stomach without difficulty and multiple views were obtained.  The patient was kept under observation until the patient left the procedure room.  The patient left the procedure room in stable condition.   Agitated microbubble saline contrast was administered.  COMPLICATIONS:    There were no immediate complications.  Findings:  1. LEFT VENTRICLE: The left ventricular wall thickness is normal.  The left ventricular cavity is dilated in size. Wall motion is diffusely hypokinetic.  LVEF is 35-40%%.  2. RIGHT VENTRICLE:  The right ventricle is normal in structure and function without any thrombus or masses.  AICD wire present  3. LEFT ATRIUM:  The left atrium is mildly dilated  in size without any thrombus or masses.  There is not spontaneous echo contrast ("smoke") in the left atrium consistent with a low flow state.  4. LEFT ATRIAL APPENDAGE:  The left atrial appendage is free of any thrombus or masses. The appendage has single lobes and a wind sock morphology . Pertinent measurements in the appendage are as follows: (mm)   0 degrees 45 degrees 90 degrees 135 degrees  LAA Ostium (mm) '14 15 13 13  '$ LAA Length  (mm) '23 17 17 18      '$ *LAA size should be between >17 mm and <31 mm, length generally greater than ostial length. Measure 2 cm from the coumadin ridge to ostium and across from left coronary artery.  .      5. ATRIAL SEPTUM:  Aneurysmal and mobile with no PFO and negative bubble study  6. RIGHT ATRIUM:  The right atrium is normal in size and function without any thrombus or masses. AICD wire present   7. MITRAL VALVE:  The mitral valve is normal in structure and function with Mild regurgitation.  There were no vegetations or stenosis.  8. AORTIC VALVE:  The aortic valve is trileaflet, moderately calcified with mild AS AVA planimetry 1.1 cm2  9. TRICUSPID VALVE:  The tricuspid valve is normal in structure and function with Mild regurgitation.  There were no vegetations or stenosis  10.  PULMONIC VALVE:  The pulmonic valve is normal in structure and function with Mild regurgitation.  There were no vegetations or stenosis.   11. AORTIC ARCH, ASCENDING AND DESCENDING AORTA:  There was grade 2 Ron Parker et. Al, 1992) atherosclerosis of the ascending aorta, aortic arch, or proximal descending aorta.  12. PULMONARY VEINS: Anomalous pulmonary venous return was not noted.  13. PERICARDIUM: The pericardium appeared normal and non-thickened.  There is  a Minimal pericardial effusion.  IMPRESSION:   See above Measurements for Watchman indicate appendage may be too small for deployment Will review with colleagues and preceptors from [place]:   (available Watchman sizes: 21, 24, 27, 30, 33 mm)  Time Spent Directly with the Patient:  88 minutes   Jenkins Rouge MD [facility] Attending Cardiologist [place]  01/10/2016, 11:35 AM

## 2016-01-13 DIAGNOSIS — E079 Disorder of thyroid, unspecified: Secondary | ICD-10-CM | POA: Diagnosis not present

## 2016-01-13 DIAGNOSIS — J069 Acute upper respiratory infection, unspecified: Secondary | ICD-10-CM | POA: Diagnosis not present

## 2016-01-13 DIAGNOSIS — I10 Essential (primary) hypertension: Secondary | ICD-10-CM | POA: Diagnosis not present

## 2016-01-13 DIAGNOSIS — E7800 Pure hypercholesterolemia, unspecified: Secondary | ICD-10-CM | POA: Diagnosis not present

## 2016-01-13 DIAGNOSIS — I5020 Unspecified systolic (congestive) heart failure: Secondary | ICD-10-CM | POA: Diagnosis not present

## 2016-01-13 DIAGNOSIS — Z7901 Long term (current) use of anticoagulants: Secondary | ICD-10-CM | POA: Diagnosis not present

## 2016-01-13 DIAGNOSIS — Z7952 Long term (current) use of systemic steroids: Secondary | ICD-10-CM | POA: Diagnosis not present

## 2016-01-13 DIAGNOSIS — Z902 Acquired absence of lung [part of]: Secondary | ICD-10-CM | POA: Diagnosis not present

## 2016-01-13 DIAGNOSIS — Z95810 Presence of automatic (implantable) cardiac defibrillator: Secondary | ICD-10-CM | POA: Diagnosis not present

## 2016-01-13 DIAGNOSIS — Z79899 Other long term (current) drug therapy: Secondary | ICD-10-CM | POA: Diagnosis not present

## 2016-01-13 DIAGNOSIS — Z888 Allergy status to other drugs, medicaments and biological substances status: Secondary | ICD-10-CM | POA: Diagnosis not present

## 2016-01-15 ENCOUNTER — Telehealth: Payer: Self-pay | Admitting: Nurse Practitioner

## 2016-01-15 NOTE — Telephone Encounter (Signed)
TEE images reviewed and favorable for Watchman. Patient notified. He will keep appointment 2/23 with Dr Stanford Breed.  If they agree that Watchman is appropriate, he is tentatively scheduled for 3/2.  I will follow up with patient next week.  He is aware and agrees with plan.  Chanetta Marshall, NP 01/15/2016 10:26 AM

## 2016-01-16 ENCOUNTER — Encounter (HOSPITAL_COMMUNITY): Payer: Self-pay

## 2016-01-16 NOTE — Progress Notes (Signed)
HPI: Steven Everett. Previously seen for asymptomatic murmur. Transesophageal echocardiogram in March of 2014 showed an ejection fraction of 30-35% and akinesis of the inferior and proximal septal myocardium. There was moderate aortic stenosis (AVA by planimetry 1.2-1.4 cm2) and mild mitral regurgitation. There was mild left atrial enlargement. Cardiac catheterization in April of 2014 showed a pulmonary catheter wedge pressure of 13, 30-40% left main, 70 followed by 80% mid LAD, 60-70% proximal right coronary artery and 70% mid. There was moderate LV dysfunction. There was mild aortic stenosis. I reviewed films with Dr Angelena Form and medical therapy felt indicated. Patient had ICD placed in September 2014. Patient seen in January of 2015 and noted to have intermittent atrial fibrillation on his device interrogation. He was started on xarelto. Pt being considered for watchman. TEE February 2017 showed ejection fraction 35-40%, mild aortic stenosis, mild mitral regurgitation, biatrial enlargement. Echocardiogram repeated in February 2017. Ejection fraction 35-40%. Mild aortic stenosis. Mild left atrial enlargement. Since he was last seen, He denies dyspnea, chest pain, palpitations or syncope.  Current Outpatient Prescriptions  Medication Sig Dispense Refill  . carvedilol (COREG) 6.25 MG tablet TAKE ONE TABLET TWICE DAILY WITH MEALS 60 tablet 2  . levothyroxine (SYNTHROID, LEVOTHROID) 112 MCG tablet Take 1 tablet (112 mcg total) by mouth daily. APPOINTMENT NEEDED FOR FURTHER REFILLS 30 tablet 1  . lisinopril (PRINIVIL,ZESTRIL) 5 MG tablet TAKE 1 TABLET DAILY. 30 tablet 10  . pravastatin (PRAVACHOL) 40 MG tablet Take 1 tablet (40 mg total) by mouth every evening. 30 tablet 5  . rivaroxaban (XARELTO) 20 MG TABS tablet Take 1 tablet (20 mg total) by mouth daily with supper. 20 tablet 0   No current facility-administered medications for this visit.     Past Medical History  Diagnosis Date  . Hemorrhoids,  internal   . Diverticulosis of colon   . Barrett's esophagus   . Cirrhosis of liver (HCC)     w/o mention of alcohol  . Cancer ([place])     lung, lower lobe  . Seborrheic dermatitis   . Hyperlipidemia   . Hypothyroidism   . Hypertension   . GERD (gastroesophageal reflux disease)   . Diabetes mellitus     type 2  . Rheumatic fever   . Aortic stenosis   . Paroxysmal atrial fibrillation ([place]) 12/14/13    detected on ICD interrogation  . Ischemic cardiomyopathy   . Chronic systolic dysfunction of left ventricle   . Sinus node dysfunction [facility])     Past Surgical History  Procedure Laterality Date  . Right middle and lower lobectomy  1998    Dr Arlyce Dice- for lung cancer by Dr Rollene Fare  . Neck surgery  6-00    Dr Annette Stable  . Appendectomy    . Back cervical    . Right hip replacement      Dr Alvan Dame  . Foot surgery    . Cardiac catheterization    . Bilat cat surgery  8-11    Dr Ayesha Rumpf  . Colonoscopy    . Tee without cardioversion N/A 02/14/2013    Procedure: TRANSESOPHAGEAL ECHOCARDIOGRAM (TEE);  Surgeon: Lelon Perla, MD;  Location: [facility] ENDOSCOPY;  Service: Cardiovascular;  Laterality: N/A;  . Cardiac pacemaker placement    . Cardiac defibrillator placement  08/12/13    Medtronic Evera XT DR implanted by [name] for primary prevention of sudden death  . Implantable cardioverter defibrillator implant N/A 08/12/2013    Procedure: IMPLANTABLE CARDIOVERTER DEFIBRILLATOR IMPLANT;  Surgeon: Coralyn Mark, MD;  Location: [facility] CATH LAB;  Service: Cardiovascular;  Laterality: N/A;  . Tee without cardioversion N/A 01/09/2016    Procedure: TRANSESOPHAGEAL ECHOCARDIOGRAM (TEE);  Surgeon: Josue Hector, MD;  Location: [facility] ENDOSCOPY;  Service: Cardiovascular;  Laterality: N/A;    Social History   Social History  . Marital Status: Widowed    Spouse Name: Hoyle Sauer  . Number of Children: 1  . Years of Education: N/A   Occupational History  .     Social History Main Topics  . Smoking status:  Former Smoker    Quit date: 02/14/1989  . Smokeless tobacco: Never Used  . Alcohol Use: No  . Drug Use: No  . Sexual Activity: Not on file   Other Topics Concern  . Not on file   Social History Narrative    Family History  Problem Relation Age of Onset  . Cancer Mother     breast  . Diabetes Mother   . Cancer Father     Lung  . Diabetes      ROS: no fevers or chills, productive cough, hemoptysis, dysphasia, odynophagia, melena, hematochezia, dysuria, hematuria, rash, seizure activity, orthopnea, PND, pedal edema, claudication. Remaining systems are negative.  Physical Exam: Well-developed well-nourished in no acute distress.  Skin is warm and dry.  HEENT is normal.  Neck is supple.  Chest is clear to auscultation with normal expansion.  Cardiovascular exam is regular rate and rhythm. 3/6 systolic murmur left sternal border. Abdominal exam nontender or distended. No masses palpated. Extremities show no edema. neuro grossly intact  ECG Sinus rhythm at a rate of 80. First degree AV block. Left anterior fascicular block. Right bundle branch block.

## 2016-01-17 ENCOUNTER — Ambulatory Visit (INDEPENDENT_AMBULATORY_CARE_PROVIDER_SITE_OTHER): Payer: Medicare Other

## 2016-01-17 ENCOUNTER — Encounter: Payer: Self-pay | Admitting: Sports Medicine

## 2016-01-17 ENCOUNTER — Ambulatory Visit (INDEPENDENT_AMBULATORY_CARE_PROVIDER_SITE_OTHER): Payer: Medicare Other | Admitting: Sports Medicine

## 2016-01-17 VITALS — BP 155/82 | HR 85 | Temp 97.8°F | Wt 190.0 lb

## 2016-01-17 DIAGNOSIS — R05 Cough: Secondary | ICD-10-CM

## 2016-01-17 DIAGNOSIS — R059 Cough, unspecified: Secondary | ICD-10-CM | POA: Insufficient documentation

## 2016-01-17 DIAGNOSIS — R062 Wheezing: Secondary | ICD-10-CM | POA: Diagnosis not present

## 2016-01-17 MED ORDER — METHYLPREDNISOLONE SODIUM SUCC 125 MG IJ SOLR
125.0000 mg | Freq: Once | INTRAMUSCULAR | Status: AC
  Administered 2016-01-17: 125 mg via INTRAMUSCULAR

## 2016-01-17 MED ORDER — PREDNISONE 50 MG PO TABS: ORAL_TABLET | ORAL | Status: DC

## 2016-01-17 MED ORDER — DOXYCYCLINE HYCLATE 100 MG PO TABS: 100.0000 mg | ORAL_TABLET | Freq: Two times a day (BID) | ORAL | Status: DC

## 2016-01-17 NOTE — Assessment & Plan Note (Signed)
Appears euvolemic on exam, cough with wheeze, history of lung cancer with right upper and middle lobectomy. With his smoking history this likely represents a COPD exacerbation. Solu-Medrol 125 intramuscular, prednisone, doxycycline, chest x-ray. Return to see PCP in 2 weeks for lung function test pre-and post bronchodilator.

## 2016-01-17 NOTE — Progress Notes (Signed)
  Subjective:    CC: "I think I have a cold"  HPI: Patient presents with a "cold" that began on Sunday. He reports first noticing a headache and feeling tired with mild rhinorrhea and a productive cough that began around Tuesday/Wednesday. He also says that he has not been able to exercise like he usually does because he gets short of breath. He does not have any SOB at rest. He denies chest pain, sore throat, ear pain, ear tingling, visual changes, N/V, or neck stiffness. He reports his headache feels generalized and he has not tried anything for it. He denies the feeling of frontal pressure. He denies any focal weakness, numbness, or tingling. His rhinorrhea and cough are productive of clearish/greenish fluid. He denies any hemoptysis or white production.  Past medical history, Surgical history, Family history not pertinant except as noted below, Social history, Allergies, and medications have been entered into the medical record, reviewed, and no changes needed.   Review of Systems: No fevers, chills, night sweats, weight loss, abdominal pain, or dysuria.   Objective:    General: Well Developed, well nourished, and in no acute distress.  Neuro: Alert and oriented x3, extra-ocular muscles intact, sensation grossly intact.  HEENT: Normocephalic, atraumatic, pupils equal round reactive to light, neck supple, no masses, no lymphadenopathy, thyroid nonpalpable, oropharynx mildly erythematous without petechiae or tonsilar exudates, TMs clear bilaterally without bulge, nares crusted, and clear conjunctivae.  Skin: Warm and dry, no rashes. Cardiac: Regular rate and rhythm, audible pacemaker noise noted, no other murmurs rubs or gallops, no lower extremity edema.  Respiratory: Diffuse wheezing heard bilaterally with absent breath sounds at right base. Not using accessory muscles, speaking in full sentences. Abdomen: Normoactive bowel sounds. NT/ND. No hepatosplenomegaly.   Impression and  Recommendations:    Patient's presentation is most likely a COPD exacerbation given diffuse wheezing, DOE, and smoking history. Not likely a CHF exacerbation given euvolemic status. Patient is not in respiratory distress at this time and so will manage as an outpatient. Will give solumedrol IM in the clinic today with an Rx for prednisone and doxycycline.   Patient has not been officially diagnosed with COPD. Recommend that patient returns to PCP for PFTs.

## 2016-01-21 ENCOUNTER — Telehealth: Payer: Self-pay | Admitting: Internal Medicine

## 2016-01-21 NOTE — Telephone Encounter (Signed)
Follow up      Pt is also calling to leave sample request at Dr Jacalyn Lefevre office.  See message below

## 2016-01-21 NOTE — Telephone Encounter (Signed)
New message ° ° ° ° ° °Patient calling the office for samples of medication: ° ° °1.  What medication and dosage are you requesting samples for? xarelto 20mg °2.  Are you currently out of this medication? Almost out ° ° °

## 2016-01-21 NOTE — Telephone Encounter (Signed)
Pt samples left at front desk for pick up; 1 months supply Pt stated he has appt with Dr. Stanford Breed on 2/23 and will pick up then.  No additional questions at this time.

## 2016-01-24 ENCOUNTER — Ambulatory Visit (INDEPENDENT_AMBULATORY_CARE_PROVIDER_SITE_OTHER): Payer: Medicare Other | Admitting: Cardiology

## 2016-01-24 ENCOUNTER — Encounter: Payer: Self-pay | Admitting: Cardiology

## 2016-01-24 VITALS — BP 112/68 | HR 80 | Ht 70.0 in | Wt 185.7 lb

## 2016-01-24 DIAGNOSIS — I2510 Atherosclerotic heart disease of native coronary artery without angina pectoris: Secondary | ICD-10-CM | POA: Diagnosis not present

## 2016-01-24 DIAGNOSIS — I4891 Unspecified atrial fibrillation: Secondary | ICD-10-CM | POA: Diagnosis not present

## 2016-01-24 DIAGNOSIS — Z95810 Presence of automatic (implantable) cardiac defibrillator: Secondary | ICD-10-CM

## 2016-01-24 DIAGNOSIS — E785 Hyperlipidemia, unspecified: Secondary | ICD-10-CM

## 2016-01-24 DIAGNOSIS — I2583 Coronary atherosclerosis due to lipid rich plaque: Secondary | ICD-10-CM

## 2016-01-24 DIAGNOSIS — I350 Nonrheumatic aortic (valve) stenosis: Secondary | ICD-10-CM

## 2016-01-24 DIAGNOSIS — I420 Dilated cardiomyopathy: Secondary | ICD-10-CM | POA: Diagnosis not present

## 2016-01-24 NOTE — Assessment & Plan Note (Signed)
Continue statin. 

## 2016-01-24 NOTE — Assessment & Plan Note (Signed)
Management per electrophysiology. 

## 2016-01-24 NOTE — Assessment & Plan Note (Signed)
Continue ACE inhibitor and beta blocker. 

## 2016-01-24 NOTE — Assessment & Plan Note (Signed)
Patient remains in sinus rhythm. Continue beta blocker and xarelto. He is being considered for watchman device. He is having some bleeding from cuts at times and is extremely concerned about the risk of bleeding. I therefore feel it is appropriate to proceed as he could avoid anticoagulation in the future potentially.

## 2016-01-24 NOTE — Assessment & Plan Note (Signed)
Plan follow-up echocardiogram February 2018.

## 2016-01-24 NOTE — Patient Instructions (Signed)
Your physician wants you to follow-up in: 6 MONTHS WITH DR CRENSHAW You will receive a reminder letter in the mail two months in advance. If you don't receive a letter, please call our office to schedule the follow-up appointment.  

## 2016-01-24 NOTE — Assessment & Plan Note (Signed)
Continue statin. He did not tolerate Lipitor previously.

## 2016-01-28 ENCOUNTER — Telehealth: Payer: Self-pay | Admitting: Nurse Practitioner

## 2016-01-28 NOTE — Telephone Encounter (Signed)
Pt returned call to Amber-pls call in the am

## 2016-01-29 ENCOUNTER — Other Ambulatory Visit: Payer: Self-pay | Admitting: Nurse Practitioner

## 2016-01-29 DIAGNOSIS — I4819 Other persistent atrial fibrillation: Secondary | ICD-10-CM

## 2016-01-29 NOTE — Telephone Encounter (Signed)
Returning your call. °

## 2016-01-29 NOTE — Telephone Encounter (Signed)
Spoke with patient regarding Watchman implant for 01/31/16. Pt scheduled for first case implant and will arrive at 6AM. Pt to come in the morning for labs, will review instructions with him at that time.  Pt aware and agrees with plan.   Chanetta Marshall, NP 01/29/2016 8:56 AM

## 2016-01-30 ENCOUNTER — Encounter: Payer: Medicare Other | Admitting: Nurse Practitioner

## 2016-01-30 ENCOUNTER — Other Ambulatory Visit (INDEPENDENT_AMBULATORY_CARE_PROVIDER_SITE_OTHER): Payer: Medicare Other | Admitting: *Deleted

## 2016-01-30 DIAGNOSIS — I481 Persistent atrial fibrillation: Secondary | ICD-10-CM

## 2016-01-30 DIAGNOSIS — I4819 Other persistent atrial fibrillation: Secondary | ICD-10-CM

## 2016-01-30 DIAGNOSIS — I4891 Unspecified atrial fibrillation: Secondary | ICD-10-CM | POA: Diagnosis not present

## 2016-01-30 LAB — BASIC METABOLIC PANEL
BUN: 15 mg/dL (ref 7–25)
CHLORIDE: 104 mmol/L (ref 98–110)
CO2: 25 mmol/L (ref 20–31)
CREATININE: 1.14 mg/dL (ref 0.70–1.18)
Calcium: 9.3 mg/dL (ref 8.6–10.3)
GLUCOSE: 98 mg/dL (ref 65–99)
POTASSIUM: 4.2 mmol/L (ref 3.5–5.3)
Sodium: 140 mmol/L (ref 135–146)

## 2016-01-30 LAB — CBC
HEMATOCRIT: 45.5 % (ref 39.0–52.0)
HEMOGLOBIN: 15.7 g/dL (ref 13.0–17.0)
MCH: 29.6 pg (ref 26.0–34.0)
MCHC: 34.5 g/dL (ref 30.0–36.0)
MCV: 85.8 fL (ref 78.0–100.0)
MPV: 10.3 fL (ref 8.6–12.4)
Platelets: 138 10*3/uL — ABNORMAL LOW (ref 150–400)
RBC: 5.3 MIL/uL (ref 4.22–5.81)
RDW: 14.1 % (ref 11.5–15.5)
WBC: 7.1 10*3/uL (ref 4.0–10.5)

## 2016-01-30 NOTE — Addendum Note (Signed)
Addended by: Eulis Foster on: 01/30/2016 07:43 AM   Modules accepted: Orders

## 2016-01-31 ENCOUNTER — Inpatient Hospital Stay (HOSPITAL_COMMUNITY): Payer: Medicare Other

## 2016-01-31 ENCOUNTER — Inpatient Hospital Stay (HOSPITAL_COMMUNITY): Payer: Medicare Other | Admitting: Certified Registered"

## 2016-01-31 ENCOUNTER — Ambulatory Visit (INDEPENDENT_AMBULATORY_CARE_PROVIDER_SITE_OTHER): Payer: Medicare Other

## 2016-01-31 ENCOUNTER — Encounter (HOSPITAL_COMMUNITY): Payer: Self-pay | Admitting: Certified Registered"

## 2016-01-31 ENCOUNTER — Inpatient Hospital Stay (HOSPITAL_COMMUNITY)
Admission: RE | Admit: 2016-01-31 | Discharge: 2016-02-01 | DRG: 274 | Disposition: A | Discharge status: Home / Self Care | Payer: Medicare Other | Source: Ambulatory Visit | Attending: Internal Medicine | Admitting: Internal Medicine

## 2016-01-31 ENCOUNTER — Encounter (HOSPITAL_COMMUNITY)
Admission: RE | Disposition: A | Discharge status: Home / Self Care | Payer: Self-pay | Source: Ambulatory Visit | Attending: Internal Medicine

## 2016-01-31 DIAGNOSIS — Z95810 Presence of automatic (implantable) cardiac defibrillator: Secondary | ICD-10-CM

## 2016-01-31 DIAGNOSIS — Z79899 Other long term (current) drug therapy: Secondary | ICD-10-CM | POA: Diagnosis not present

## 2016-01-31 DIAGNOSIS — Z7901 Long term (current) use of anticoagulants: Secondary | ICD-10-CM | POA: Diagnosis not present

## 2016-01-31 DIAGNOSIS — I4891 Unspecified atrial fibrillation: Secondary | ICD-10-CM | POA: Diagnosis not present

## 2016-01-31 DIAGNOSIS — I481 Persistent atrial fibrillation: Secondary | ICD-10-CM | POA: Diagnosis not present

## 2016-01-31 DIAGNOSIS — K7460 Unspecified cirrhosis of liver: Secondary | ICD-10-CM | POA: Diagnosis not present

## 2016-01-31 DIAGNOSIS — I5022 Chronic systolic (congestive) heart failure: Secondary | ICD-10-CM | POA: Diagnosis present

## 2016-01-31 DIAGNOSIS — E039 Hypothyroidism, unspecified: Secondary | ICD-10-CM | POA: Diagnosis present

## 2016-01-31 DIAGNOSIS — Z96641 Presence of right artificial hip joint: Secondary | ICD-10-CM | POA: Diagnosis present

## 2016-01-31 DIAGNOSIS — I4819 Other persistent atrial fibrillation: Secondary | ICD-10-CM | POA: Diagnosis present

## 2016-01-31 DIAGNOSIS — Z9842 Cataract extraction status, left eye: Secondary | ICD-10-CM | POA: Diagnosis not present

## 2016-01-31 DIAGNOSIS — E785 Hyperlipidemia, unspecified: Secondary | ICD-10-CM | POA: Diagnosis present

## 2016-01-31 DIAGNOSIS — Z888 Allergy status to other drugs, medicaments and biological substances status: Secondary | ICD-10-CM

## 2016-01-31 DIAGNOSIS — Z85118 Personal history of other malignant neoplasm of bronchus and lung: Secondary | ICD-10-CM | POA: Diagnosis not present

## 2016-01-31 DIAGNOSIS — Z87891 Personal history of nicotine dependence: Secondary | ICD-10-CM | POA: Diagnosis not present

## 2016-01-31 DIAGNOSIS — Z902 Acquired absence of lung [part of]: Secondary | ICD-10-CM

## 2016-01-31 DIAGNOSIS — I350 Nonrheumatic aortic (valve) stenosis: Secondary | ICD-10-CM | POA: Diagnosis present

## 2016-01-31 DIAGNOSIS — I10 Essential (primary) hypertension: Secondary | ICD-10-CM | POA: Diagnosis present

## 2016-01-31 DIAGNOSIS — E119 Type 2 diabetes mellitus without complications: Secondary | ICD-10-CM | POA: Diagnosis not present

## 2016-01-31 DIAGNOSIS — Z006 Encounter for examination for normal comparison and control in clinical research program: Secondary | ICD-10-CM | POA: Diagnosis not present

## 2016-01-31 DIAGNOSIS — Z9841 Cataract extraction status, right eye: Secondary | ICD-10-CM

## 2016-01-31 DIAGNOSIS — K219 Gastro-esophageal reflux disease without esophagitis: Secondary | ICD-10-CM | POA: Diagnosis not present

## 2016-01-31 DIAGNOSIS — I480 Paroxysmal atrial fibrillation: Secondary | ICD-10-CM | POA: Diagnosis present

## 2016-01-31 DIAGNOSIS — I2510 Atherosclerotic heart disease of native coronary artery without angina pectoris: Secondary | ICD-10-CM | POA: Diagnosis present

## 2016-01-31 HISTORY — DX: Type 2 diabetes mellitus without complications: E11.9

## 2016-01-31 HISTORY — PX: LEFT ATRIAL APPENDAGE OCCLUSION: SHX173A

## 2016-01-31 HISTORY — DX: Malignant neoplasm of unspecified part of right bronchus or lung: C34.91

## 2016-01-31 LAB — GLUCOSE, CAPILLARY
GLUCOSE-CAPILLARY: 106 mg/dL — AB (ref 65–99)
GLUCOSE-CAPILLARY: 93 mg/dL (ref 65–99)
GLUCOSE-CAPILLARY: 118 mg/dL — AB (ref 65–99)
Glucose-Capillary: 90 mg/dL (ref 65–99)

## 2016-01-31 LAB — TYPE AND SCREEN
ABO/RH(D): A POS
ANTIBODY SCREEN: NEGATIVE

## 2016-01-31 LAB — POCT ACTIVATED CLOTTING TIME
ACTIVATED CLOTTING TIME: 559 s
Activated Clotting Time: 147 seconds

## 2016-01-31 SURGERY — LEFT ATRIAL APPENDAGE OCCLUSION
Anesthesia: General

## 2016-01-31 MED ORDER — MIDAZOLAM HCL 2 MG/2ML IJ SOLN
INTRAMUSCULAR | Status: DC | PRN
  Administered 2016-01-31: 1 mg via INTRAVENOUS

## 2016-01-31 MED ORDER — SODIUM CHLORIDE 0.9 % IV SOLN: 250.0000 mL | INTRAVENOUS | Status: DC | PRN

## 2016-01-31 MED ORDER — BUPIVACAINE HCL (PF) 0.25 % IJ SOLN
INTRAMUSCULAR | Status: DC | PRN
  Administered 2016-01-31: 10 mL

## 2016-01-31 MED ORDER — CEFAZOLIN SODIUM-DEXTROSE 2-3 GM-% IV SOLR
2.0000 g | INTRAVENOUS | Status: AC
  Administered 2016-01-31: 2 g via INTRAVENOUS

## 2016-01-31 MED ORDER — HEPARIN (PORCINE) IN NACL 2-0.9 UNIT/ML-% IJ SOLN
INTRAMUSCULAR | Status: AC
  Filled 2016-01-31: qty 500

## 2016-01-31 MED ORDER — SODIUM CHLORIDE 0.9 % IV SOLN: INTRAVENOUS | Status: DC

## 2016-01-31 MED ORDER — ONDANSETRON HCL 4 MG/2ML IJ SOLN: 4.0000 mg | Freq: Four times a day (QID) | INTRAMUSCULAR | Status: DC | PRN

## 2016-01-31 MED ORDER — HEPARIN SODIUM (PORCINE) 1000 UNIT/ML IJ SOLN
INTRAMUSCULAR | Status: DC | PRN
  Administered 2016-01-31: 10000 [IU] via INTRAVENOUS

## 2016-01-31 MED ORDER — ROCURONIUM BROMIDE 100 MG/10ML IV SOLN
INTRAVENOUS | Status: DC | PRN
  Administered 2016-01-31: 40 mg via INTRAVENOUS
  Administered 2016-01-31: 10 mg via INTRAVENOUS

## 2016-01-31 MED ORDER — DEXTROSE 5 % IV SOLN
10.0000 mg | INTRAVENOUS | Status: DC | PRN
  Administered 2016-01-31: 60 ug/min via INTRAVENOUS

## 2016-01-31 MED ORDER — SODIUM CHLORIDE 0.9% FLUSH: 3.0000 mL | INTRAVENOUS | Status: DC | PRN

## 2016-01-31 MED ORDER — SODIUM CHLORIDE 0.9 % IV SOLN: Freq: Once | INTRAVENOUS | Status: DC

## 2016-01-31 MED ORDER — HEPARIN (PORCINE) IN NACL 2-0.9 UNIT/ML-% IJ SOLN
INTRAMUSCULAR | Status: DC | PRN
  Administered 2016-01-31: 1000 mL

## 2016-01-31 MED ORDER — HEPARIN SODIUM (PORCINE) 1000 UNIT/ML IJ SOLN
INTRAMUSCULAR | Status: DC | PRN
  Administered 2016-01-31: 2000 [IU] via INTRAVENOUS

## 2016-01-31 MED ORDER — LIDOCAINE HCL (CARDIAC) 20 MG/ML IV SOLN
INTRAVENOUS | Status: DC | PRN
  Administered 2016-01-31: 60 mg via INTRAVENOUS

## 2016-01-31 MED ORDER — SODIUM CHLORIDE 0.9% FLUSH
3.0000 mL | Freq: Two times a day (BID) | INTRAVENOUS | Status: DC
  Administered 2016-01-31: 3 mL via INTRAVENOUS

## 2016-01-31 MED ORDER — FENTANYL CITRATE (PF) 250 MCG/5ML IJ SOLN
INTRAMUSCULAR | Status: DC | PRN
  Administered 2016-01-31 (×2): 50 ug via INTRAVENOUS

## 2016-01-31 MED ORDER — HEPARIN SODIUM (PORCINE) 1000 UNIT/ML IJ SOLN
INTRAMUSCULAR | Status: AC
  Filled 2016-01-31: qty 1

## 2016-01-31 MED ORDER — PROPOFOL 10 MG/ML IV BOLUS
INTRAVENOUS | Status: DC | PRN
  Administered 2016-01-31: 90 mg via INTRAVENOUS

## 2016-01-31 MED ORDER — LEVOTHYROXINE SODIUM 112 MCG PO TABS
112.0000 ug | ORAL_TABLET | Freq: Every day | ORAL | Status: DC
  Administered 2016-02-01: 112 ug via ORAL
  Filled 2016-01-31: qty 1

## 2016-01-31 MED ORDER — RIVAROXABAN 20 MG PO TABS
20.0000 mg | ORAL_TABLET | Freq: Every day | ORAL | Status: DC
  Administered 2016-01-31: 20 mg via ORAL
  Filled 2016-01-31: qty 1

## 2016-01-31 MED ORDER — SODIUM CHLORIDE 0.45 % IV SOLN: INTRAVENOUS | Status: DC

## 2016-01-31 MED ORDER — CEFAZOLIN SODIUM-DEXTROSE 2-3 GM-% IV SOLR
INTRAVENOUS | Status: AC
  Filled 2016-01-31: qty 50

## 2016-01-31 MED ORDER — SUGAMMADEX SODIUM 200 MG/2ML IV SOLN
INTRAVENOUS | Status: DC | PRN
  Administered 2016-01-31: 150 mg via INTRAVENOUS

## 2016-01-31 MED ORDER — CARVEDILOL 3.125 MG PO TABS
6.2500 mg | ORAL_TABLET | Freq: Two times a day (BID) | ORAL | Status: DC
  Administered 2016-01-31 – 2016-02-01 (×2): 6.25 mg via ORAL
  Filled 2016-01-31 (×2): qty 2

## 2016-01-31 MED ORDER — ACETAMINOPHEN 325 MG PO TABS: 650.0000 mg | ORAL_TABLET | ORAL | Status: DC | PRN

## 2016-01-31 MED ORDER — LISINOPRIL 5 MG PO TABS
5.0000 mg | ORAL_TABLET | Freq: Every day | ORAL | Status: DC
  Filled 2016-01-31: qty 1

## 2016-01-31 MED ORDER — LACTATED RINGERS IV SOLN
INTRAVENOUS | Status: DC | PRN
  Administered 2016-01-31 (×2): via INTRAVENOUS

## 2016-01-31 MED ORDER — PROTAMINE SULFATE 10 MG/ML IV SOLN
INTRAVENOUS | Status: DC | PRN
  Administered 2016-01-31: 30 mg via INTRAVENOUS

## 2016-01-31 MED ORDER — ONDANSETRON HCL 4 MG/2ML IJ SOLN
INTRAMUSCULAR | Status: DC | PRN
  Administered 2016-01-31: 4 mg via INTRAVENOUS

## 2016-01-31 SURGICAL SUPPLY — 20 items
BLANKET WARM UNDERBOD FULL ACC (MISCELLANEOUS) ×2 IMPLANT
CATH DIAG 6FR PIGTAIL (CATHETERS) ×2 IMPLANT
KIT HEART LEFT (KITS) ×2 IMPLANT
NDL TRANSEP BRK 71CM 407200 (NEEDLE) IMPLANT
NDL TRANSSPETAL BRK-XS 71CM (NEEDLE) IMPLANT
NEEDLE TRANSEP BRK 71CM 407200 (NEEDLE) ×3 IMPLANT
NEEDLE TRANSSPETAL BRK-XS 71CM (NEEDLE) ×3
PACK CARDIAC CATHETERIZATION (CUSTOM PROCEDURE TRAY) ×2 IMPLANT
PAD DEFIB LIFELINK (PAD) ×2 IMPLANT
SHEATH INTRO CHECKFLO 16F 13 (SHEATH) IMPLANT
SHEATH INTRO CHECKFLO 16F 13CM (SHEATH) ×2
SHEATH PINNACLE 8F 10CM (SHEATH) ×3 IMPLANT
SHEATH SWARTZ TS SL2 63CM 8.5F (SHEATH) ×2 IMPLANT
SHIELD RADPAD SCOOP 12X17 (MISCELLANEOUS) ×2 IMPLANT
TRANSDUCER W/STOPCOCK (MISCELLANEOUS) ×2 IMPLANT
TUBING CIL FLEX 10 FLL-RA (TUBING) ×2 IMPLANT
WATCHMAN ACCESS DOUBLE CURVE (SHEATH) ×2 IMPLANT
WATCHMAN CLOSURE 24MM (Prosthesis & Implant Heart) ×2 IMPLANT
WIRE AMPLATZ WHISKJ .035X260CM (WIRE) ×2 IMPLANT
WIRE MAILMAN 182CM (WIRE) ×2 IMPLANT

## 2016-01-31 NOTE — Anesthesia Postprocedure Evaluation (Signed)
Anesthesia Post Note  Patient: Steven Everett  Procedure(s) Performed: Procedure(s) (LRB): LEFT ATRIAL APPENDAGE OCCLUSION (N/A)  Patient location during evaluation: PACU Anesthesia Type: General Level of consciousness: awake and alert Pain management: pain level controlled Vital Signs Assessment: post-procedure vital signs reviewed and stable Respiratory status: spontaneous breathing, nonlabored ventilation, respiratory function stable and patient connected to nasal cannula oxygen Cardiovascular status: blood pressure returned to baseline and stable Postop Assessment: no signs of nausea or vomiting Anesthetic complications: no    Last Vitals:  Filed Vitals:   01/31/16 1105 01/31/16 1123  BP: 125/65 136/66  Pulse: 74 88  Temp:  36.7 C  Resp: 8     Last Pain: There were no vitals filed for this visit.               Pecola Haxton,JAMES TERRILL

## 2016-01-31 NOTE — Addendum Note (Signed)
Addendum  created 01/31/16 1453 by Barrington Ellison, CRNA   Modules edited: Anesthesia Flowsheet

## 2016-01-31 NOTE — Discharge Summary (Signed)
ELECTROPHYSIOLOGY PROCEDURE DISCHARGE SUMMARY    Patient ID: Steven Everett,  MRN: [id number], DOB/AGE: [birth year] 81 y.o.  Admit date: 01/31/2016 Discharge date: 02/01/2016  Primary Care Physician: Beatrice Lecher, MD Primary Cardiologist: Stanford Breed Electrophysiologist: Thompson Grayer, MD  Primary Discharge Diagnosis:  Paroxysmal atrial fibrillation status post LAA occluder insertion this admission  Secondary Discharge Diagnosis:  1.  Mild aortic stenosis 2.  ICM s/p ICD 3.  Diabetes 4.  Sinus node dysfunction 5.  Cirrhosis 6.  Hypertension 7.  Hyperlipidemia  Procedures This Admission:  1.  Insertion of left atrial appendage occluder (Watchman) on 01/31/16 by [name] and Dr Burt Knack.  This study demonstrated successful implantation of Watchman LAA occluder with no early apparent complications. TEE at time of procedure demonstrated no leak around device.   Brief HPI: VALIN MASSIE is a 81 y.o. male with a history of paroxysmal atrial fibrillation.  They are felt to not be a candidate for long term Warfarin due to high risk of bleeding. Risks, benefits, and alternatives to Watchman implant were reviewed with the patient who wished to proceed.  The patient underwent TEE prior to the procedure which demonstrated appendage suitable for attempt at [facility] placement.    Hospital Course:  The patient was admitted and underwent Watchman insertion with details as outlined above.  They were monitored on telemetry overnight which demonstrated sinus rhythm.  Groin was without complication on the day of discharge.  The patient was examined and considered to be stable for discharge.  Wound care and restrictions were reviewed with the patient.   Renal function slightly elevated post procedure, will hold Lisinopril for 2 days then resume.   This patients CHA2DS2-VASc Score and unadjusted Ischemic Stroke Rate (% per year) is equal to 7.2 % stroke rate/year from a score of 5 Above score calculated  as 1 point each if present [CHF, HTN, DM, Vascular=MI/PAD/Aortic Plaque, Age if 65-74, or Male] Above score calculated as 2 points each if present [Age > 75, or Stroke/TIA/TE]  The patient's Xarelto will be resumed.  The patient will be seen by APP in 1 week for groin check, monitor for s/s of bleeding, CBC.  45 day TEE will also be scheduled at that time.   Physical Exam: Filed Vitals:   01/31/16 1500 01/31/16 1707 01/31/16 1921 02/01/16 0409  BP: 1'07/59 92/62 95/57 '$ 113/67  Pulse: 96 90 92 83  Temp:  98 F (36.7 C) 98.5 F (36.9 C) 98.1 F (36.7 C)  TempSrc:  Oral Oral Oral  Resp: '15 15 15 16  '$ Height:      Weight:    183 lb 13.8 oz (83.4 kg)  SpO2: 98% 100% 100% 100%    GEN- The patient is well appearing, alert and oriented x 3 today.   HEENT: normocephalic, atraumatic; sclera clear, conjunctiva pink; hearing intact; oropharynx clear; neck supple Lungs- Clear to ausculation bilaterally, normal work of breathing.  No wheezes, rales, rhonchi Heart- Regular rate and rhythm  GI- soft, non-tender, non-distended, bowel sounds present Extremities- no clubbing, cyanosis, or edema; DP/PT/radial pulses 2+ bilaterally, groin without hematoma/bruit MS- no significant deformity or atrophy Skin- warm and dry, no rash or lesion Psych- euthymic mood, full affect Neuro- strength and sensation are intact   Labs:   Lab Results  Component Value Date   WBC 7.1 01/30/2016   HGB 15.7 01/30/2016   HCT 45.5 01/30/2016   MCV 85.8 01/30/2016   PLT 138* 01/30/2016     Recent Labs Lab 02/01/16  0440  NA 138  K 4.2  CL 101  CO2 28  BUN 13  CREATININE 1.29*  CALCIUM 9.0  GLUCOSE 120*     Discharge Medications:    Medication List    TAKE these medications        aspirin EC 81 MG tablet  Take 1 tablet (81 mg total) by mouth daily.     carvedilol 6.25 MG tablet  Commonly known as:  COREG  TAKE ONE TABLET TWICE DAILY WITH MEALS     levothyroxine 112 MCG tablet  Commonly known  as:  SYNTHROID, LEVOTHROID  Take 1 tablet (112 mcg total) by mouth daily. APPOINTMENT NEEDED FOR FURTHER REFILLS     lisinopril 5 MG tablet  Commonly known as:  PRINIVIL,ZESTRIL  Take 1 tablet (5 mg total) by mouth daily. Restart on 02/03/16     rivaroxaban 20 MG Tabs tablet  Commonly known as:  XARELTO  Take 1 tablet (20 mg total) by mouth daily with supper.        Disposition:  Discharge Instructions    Diet - low sodium heart healthy    Complete by:  As directed      Discharge instructions    Complete by:  As directed   No driving for 3 days. No lifting over 5 lbs for 1 week. No sexual activity for 1 week. Keep procedure site clean & dry. If you notice increased pain, swelling, bleeding or pus, call/return!  You may shower, but no soaking baths/hot tubs/pools for 1 week.     Increase activity slowly    Complete by:  As directed           Follow-up Information    Follow up with Thompson Grayer, MD On 02/11/2016.   Specialty:  Cardiology   Why:  at 8:30AM - appt with Chanetta Marshall, NP   Contact information:   [place] [address] [phone]       Duration of Discharge Encounter: Greater than 30 minutes including physician time.  Fidel Levy, NP 02/01/2016 6:38 AM  Patient seen, examined. Available data reviewed. Agree with findings, assessment, and plan as outlined by Chanetta Marshall, NP. The patient was independently interviewed and examined. His right groin site is clear without evidence of ecchymoses or hematoma. Agree with the plan as outlined above. Patient will be treated with Xarelto and low-dose aspirin for 6 weeks until his follow-up TEE is performed. Follow-up is arranged as above.  Sherren Mocha, M.D. 02/01/2016 8:00 AM

## 2016-01-31 NOTE — Anesthesia Preprocedure Evaluation (Addendum)
Anesthesia Evaluation  Patient identified by MRN, date of birth, ID band Patient awake    Reviewed: Allergy & Precautions, NPO status   History of Anesthesia Complications Negative for: history of anesthetic complications  Airway Mallampati: II  TM Distance: >3 FB Neck ROM: Full    Dental  (+) Edentulous Upper, Edentulous Lower   Pulmonary former smoker,    breath sounds clear to auscultation       Cardiovascular hypertension, + CAD  + Valvular Problems/Murmurs AS  Rhythm:Irregular Rate:Normal     Neuro/Psych negative neurological ROS     GI/Hepatic GERD  ,  Endo/Other  diabetesHypothyroidism   Renal/GU      Musculoskeletal   Abdominal   Peds  Hematology   Anesthesia Other Findings   Reproductive/Obstetrics                           Anesthesia Physical Anesthesia Plan  ASA: III  Anesthesia Plan: General   Post-op Pain Management:    Induction: Intravenous  Airway Management Planned: Oral ETT  Additional Equipment: TEE and Arterial line  Intra-op Plan:   Post-operative Plan: Extubation in OR  Informed Consent: I have reviewed the patients History and Physical, chart, labs and discussed the procedure including the risks, benefits and alternatives for the proposed anesthesia with the patient or authorized representative who has indicated his/her understanding and acceptance.   Dental advisory given  Plan Discussed with: CRNA and Surgeon  Anesthesia Plan Comments:         Anesthesia Quick Evaluation

## 2016-01-31 NOTE — H&P (Signed)
PCP: Beatrice Lecher, MD Primary Cardiologist: Dr Stanford Breed  The patient presents today for left atrial appendage occlusive procedure (Watchman). Since having his last visit, the patient reports doing very well. He seems mostly asymptomatic with his AS.  Today, he denies symptoms of palpitations, chest pain, shortness of breath, orthopnea, PND, lower extremity edema, dizziness, presyncope, syncope, or neurologic sequela. The patient feels that he is tolerating medications without difficulties and is otherwise without complaint today.   Past Medical History  Diagnosis Date  . Hemorrhoids, internal   . Diverticulosis of colon   . Barrett's esophagus   . Cirrhosis of liver (HCC)     w/o mention of alcohol  . Cancer ([place])     lung, lower lobe  . Seborrheic dermatitis   . Hyperlipidemia   . Hypothyroidism   . Hypertension   . GERD (gastroesophageal reflux disease)   . Diabetes mellitus     type 2  . Rheumatic fever   . Aortic stenosis   . Paroxysmal atrial fibrillation ([place]) 12/14/13    detected on ICD interrogation  . Ischemic cardiomyopathy   . Chronic systolic dysfunction of left ventricle   . Sinus node dysfunction [facility])    Past Surgical History  Procedure Laterality Date  . Right middle and lower lobectomy  1998    Dr Arlyce Dice- for lung cancer by Dr Rollene Fare  . Neck surgery  6-00    Dr Annette Stable  . Appendectomy    . Back cervical    . Right hip replacement      Dr Alvan Dame  . Foot surgery    . Cardiac catheterization    . Bilat cat surgery  8-11    Dr Ayesha Rumpf  . Colonoscopy    . Tee without cardioversion N/A 02/14/2013    Procedure: TRANSESOPHAGEAL ECHOCARDIOGRAM (TEE); Surgeon: Lelon Perla, MD; Location: [facility] ENDOSCOPY; Service: Cardiovascular; Laterality: N/A;  . Cardiac pacemaker placement    . Cardiac defibrillator placement   08/12/13    Medtronic Evera XT DR implanted by [name] for primary prevention of sudden death  . Implantable cardioverter defibrillator implant N/A 08/12/2013    Procedure: IMPLANTABLE CARDIOVERTER DEFIBRILLATOR IMPLANT; Surgeon: Coralyn Mark, MD; Location: [facility] CATH LAB; Service: Cardiovascular; Laterality: N/A;    Current Outpatient Prescriptions  Medication Sig Dispense Refill  . carvedilol (COREG) 6.25 MG tablet TAKE ONE TABLET TWICE DAILY WITH MEALS 60 tablet 2  . levothyroxine (SYNTHROID, LEVOTHROID) 112 MCG tablet Take 1 tablet (112 mcg total) by mouth daily. APPOINTMENT NEEDED FOR FURTHER REFILLS 30 tablet 1  . lisinopril (PRINIVIL,ZESTRIL) 5 MG tablet TAKE 1 TABLET DAILY. 30 tablet 10  . pravastatin (PRAVACHOL) 40 MG tablet Take 1 tablet (40 mg total) by mouth every evening. 30 tablet 5  . rivaroxaban (XARELTO) 20 MG TABS tablet Take 1 tablet (20 mg total) by mouth daily with supper. 20 tablet 0   No current facility-administered medications for this visit.    Allergies  Allergen Reactions  . Atorvastatin     REACTION: joint aches    Social History   Social History  . Marital Status: Widowed    Spouse Name: Hoyle Sauer  . Number of Children: 1  . Years of Education: N/A   Occupational History  .     Social History Main Topics  . Smoking status: Former Smoker    Quit date: 02/14/1989  . Smokeless tobacco: Never Used  . Alcohol Use: No  . Drug Use: No  . Sexual Activity: Not on file  Other Topics Concern  . Not on file   Social History Narrative    Family History  Problem Relation Age of Onset  . Cancer Mother     breast  . Diabetes Mother   . Cancer Father     Lung  . Diabetes      ROS- All systems are reviewed and are negative except as outlined in the HPI above  Physical Exam: Filed Vitals:   01/31/16  0531  BP: 130/69  Pulse: 78  Temp: 97.7 F (36.5 C)  Resp: 18   GEN- The patient is well appearing, alert and oriented x 3 today.  Head- normocephalic, atraumatic Eyes- Sclera clear, conjunctiva pink Ears- hearing intact Oropharynx- clear Neck- supple,  Lungs- Clear to ausculation bilaterally, normal work of breathing Heart- Regular rate and rhythm, 2/6 SEM LUSB, S2 is very audible GI- soft, NT, ND, + BS Extremities- no clubbing, cyanosis, or edema MS- no significant deformity or atrophy Skin- ecchymosis over both hands Psych- euthymic mood, full affect Neuro- strength and sensation are intact   Assessment and Plan:  1. afib Chads2vasc score is at least 5. He does not feel that he is tolerating xarelto well due to frequent bruising and bleeding (skin). He has reduced his activity due to this and is very concerned about bleeding risks. He does not feel that he could take coumadin due to bleeding concerns and is not willing to make this change presently. He has been evaluated by Dr Stanford Breed who has discussed Watchman implant with the patient and they agree to proceed. Procedural risks for the Watchman implant have been reviewed with the patient and his son again today including a 1% risk of stroke, 2% risk of perforation, 0.1% risk of device embolization.  We will proceed at this time.   Thompson Grayer MD, [facility] 01/31/2016 7:15 AM

## 2016-01-31 NOTE — Progress Notes (Signed)
Site area: Right groin a 16 french venous sheath was removed  Site Prior to Removal:  Level 0  Pressure Applied For 20 MINUTES    Minutes Beginning at 1025a  Manual:   Yes.    Patient Status During Pull:  stable  Post Pull Groin Site:  Level 0  Post Pull Instructions Given:  Yes.    Post Pull Pulses Present:  Yes.    Dressing Applied:  Yes.    Comments:  VS remain stable during sheath pull

## 2016-01-31 NOTE — Transfer of Care (Signed)
Immediate Anesthesia Transfer of Care Note  Patient: Steven Everett  Procedure(s) Performed: Procedure(s): LEFT ATRIAL APPENDAGE OCCLUSION (N/A)  Patient Location: Cath Lab  Anesthesia Type:General  Level of Consciousness: lethargic and responds to stimulation  Airway & Oxygen Therapy: Patient Spontanous Breathing and Patient connected to nasal cannula oxygen  Post-op Assessment: Report given to RN  Post vital signs: Reviewed and stable  Last Vitals:  Filed Vitals:   01/31/16 0531  BP: 130/69  Pulse: 78  Temp: 36.5 C  Resp: 18    Complications: No apparent anesthesia complications

## 2016-01-31 NOTE — Anesthesia Procedure Notes (Signed)
Procedure Name: Intubation Date/Time: 01/31/2016 7:43 AM Performed by: Barrington Ellison Pre-anesthesia Checklist: Patient identified, Emergency Drugs available, Suction available, Patient being monitored and Timeout performed Patient Re-evaluated:Patient Re-evaluated prior to inductionOxygen Delivery Method: Circle system utilized Preoxygenation: Pre-oxygenation with 100% oxygen Intubation Type: IV induction Ventilation: Mask ventilation without difficulty Laryngoscope Size: Mac and 3 Grade View: Grade I Tube type: Oral Tube size: 7.5 mm Number of attempts: 1 Airway Equipment and Method: Stylet Placement Confirmation: ETT inserted through vocal cords under direct vision,  positive ETCO2 and breath sounds checked- equal and bilateral Secured at: 22 cm Tube secured with: Tape Dental Injury: Teeth and Oropharynx as per pre-operative assessment

## 2016-02-01 DIAGNOSIS — I480 Paroxysmal atrial fibrillation: Principal | ICD-10-CM

## 2016-02-01 LAB — BASIC METABOLIC PANEL
ANION GAP: 9 (ref 5–15)
BUN: 13 mg/dL (ref 6–20)
CO2: 28 mmol/L (ref 22–32)
Calcium: 9 mg/dL (ref 8.9–10.3)
Chloride: 101 mmol/L (ref 101–111)
Creatinine, Ser: 1.29 mg/dL — ABNORMAL HIGH (ref 0.61–1.24)
GFR, EST AFRICAN AMERICAN: 60 mL/min — AB (ref >60)
GFR, EST NON AFRICAN AMERICAN: 51 mL/min — AB (ref >60)
Glucose, Bld: 120 mg/dL — ABNORMAL HIGH (ref 65–99)
POTASSIUM: 4.2 mmol/L (ref 3.5–5.1)
SODIUM: 138 mmol/L (ref 135–145)

## 2016-02-01 LAB — GLUCOSE, CAPILLARY: GLUCOSE-CAPILLARY: 99 mg/dL (ref 65–99)

## 2016-02-01 MED ORDER — LISINOPRIL 5 MG PO TABS: 5.0000 mg | ORAL_TABLET | Freq: Every day | ORAL | Status: DC

## 2016-02-01 MED ORDER — ASPIRIN EC 81 MG PO TBEC: 81.0000 mg | DELAYED_RELEASE_TABLET | Freq: Every day | ORAL | Status: DC

## 2016-02-09 NOTE — Progress Notes (Signed)
Electrophysiology Office Note Date: 02/11/2016  ID:  Vail, Vuncannon [birth year], MRN [id number]  PCP: Beatrice Lecher, MD Primary Cardiologist: Stanford Breed Electrophysiologist: Doroteo Nickolson  CC: post Watchman follow up  Steven Everett is a 81 y.o. male seen today post Watchman implant. Since discharge, the patient reports doing very well. He denies chest pain, palpitations, dyspnea, PND, orthopnea, nausea, vomiting, dizziness, syncope, edema, weight gain, or early satiety.  He has not had groin complications. He has not had ICD shocks.   Device History: MDT dual chamber ICD implanted 2014 for primary prevention History of appropriate therapy: no History of AAD therapy: no   Past Medical History  Diagnosis Date  . Hemorrhoids, internal   . Diverticulosis of colon   . Barrett's esophagus   . Cirrhosis of liver (HCC)     w/o mention of alcohol  . Seborrheic dermatitis   . Hyperlipidemia   . Hypothyroidism   . Hypertension   . GERD (gastroesophageal reflux disease)   . Rheumatic fever   . Aortic stenosis   . Paroxysmal atrial fibrillation ([place]) 12/14/13    detected on ICD interrogation  . Ischemic cardiomyopathy   . Chronic systolic dysfunction of left ventricle   . Sinus node dysfunction (HCC)   . AICD (automatic cardioverter/defibrillator) present   . Heart murmur   . Type II diabetes mellitus ([place])     "weighed over 300#; lost weight; lost diabetes" (01/31/2016)  . Cancer of right lung [facility])     lower lobes   Past Surgical History  Procedure Laterality Date  . Lung removal, partial Right 1998    for lung cancer by Dr Rollene Fare; middle and lower lobes  . Back surgery    . Total hip arthroplasty Right     Dr Alvan Dame  . Fracture surgery    . Cataract extraction, bilateral Bilateral 8-11    Dr Ayesha Rumpf  . Colonoscopy    . Tee without cardioversion N/A 02/14/2013    Procedure: TRANSESOPHAGEAL ECHOCARDIOGRAM (TEE);  Surgeon: Lelon Perla, MD;  Location: [facility] ENDOSCOPY;   Service: Cardiovascular;  Laterality: N/A;  . Cardiac pacemaker placement    . Cardiac defibrillator placement  08/12/13    Medtronic Evera XT DR implanted by [name] for primary prevention of sudden death  . Implantable cardioverter defibrillator implant N/A 08/12/2013    Procedure: IMPLANTABLE CARDIOVERTER DEFIBRILLATOR IMPLANT;  Surgeon: Coralyn Mark, MD;  Location: [facility] CATH LAB;  Service: Cardiovascular;  Laterality: N/A;  . Tee without cardioversion N/A 01/09/2016    Procedure: TRANSESOPHAGEAL ECHOCARDIOGRAM (TEE);  Surgeon: Josue Hector, MD;  Location: [facility] ENDOSCOPY;  Service: Cardiovascular;  Laterality: N/A;  . Cardiac catheterization  "years ago"  . Appendectomy  1940s  . Joint replacement    . Lumbar disc surgery  X 2    "herniated discs"  . Ankle fracture surgery Right 1940s  . Electrophysiologic study N/A 01/31/2016    Procedure: LEFT ATRIAL APPENDAGE OCCLUSION;  Surgeon: Thompson Grayer, MD;  Location: [facility] CV LAB;  Service: Cardiovascular;  Laterality: N/A;    Current Outpatient Prescriptions  Medication Sig Dispense Refill  . aspirin EC 81 MG tablet Take 1 tablet (81 mg total) by mouth daily.    . carvedilol (COREG) 6.25 MG tablet TAKE ONE TABLET TWICE DAILY WITH MEALS 60 tablet 2  . levothyroxine (SYNTHROID, LEVOTHROID) 112 MCG tablet Take 1 tablet (112 mcg total) by mouth daily. APPOINTMENT NEEDED FOR FURTHER REFILLS 30 tablet 1  . lisinopril (PRINIVIL,ZESTRIL) 5 MG  tablet Take 1 tablet (5 mg total) by mouth daily. Restart on 02/03/16 30 tablet 10  . rivaroxaban (XARELTO) 20 MG TABS tablet Take 1 tablet (20 mg total) by mouth daily with supper. 20 tablet 0   No current facility-administered medications for this visit.    Allergies:   Atorvastatin   Social History: Social History   Social History  . Marital Status: Widowed    Spouse Name: Hoyle Sauer  . Number of Children: 1  . Years of Education: N/A   Occupational History  .     Social History Main Topics  .  Smoking status: Former Smoker -- 2.00 packs/day for 36 years    Types: Cigarettes    Quit date: 02/14/1989  . Smokeless tobacco: Never Used  . Alcohol Use: No  . Drug Use: No  . Sexual Activity: Not Currently   Other Topics Concern  . Not on file   Social History Narrative    Family History: Family History  Problem Relation Age of Onset  . Cancer Mother     breast  . Diabetes Mother   . Cancer Father     Lung  . Diabetes      Review of Systems: All other systems reviewed and are otherwise negative except as noted above.   Physical Exam: VS:  BP 138/84 mmHg  Pulse 72  Ht '5\' 10"'$  (1.778 m)  Wt 187 lb 9.6 oz (85.095 kg)  BMI 26.92 kg/m2 , BMI Body mass index is 26.92 kg/(m^2).  GEN- The patient is well appearing, alert and oriented x 3 today.   HEENT: normocephalic, atraumatic; sclera clear, conjunctiva pink; hearing intact; oropharynx clear; neck supple Lungs- Clear to ausculation bilaterally, normal work of breathing.  No wheezes, rales, rhonchi Heart- Regular rate and rhythm, no murmurs, rubs or gallops GI- soft, non-tender, non-distended, bowel sounds present Extremities- no clubbing, cyanosis, or edema; DP/PT/radial pulses 2+ bilaterally MS- no significant deformity or atrophy Skin- warm and dry, no rash or lesion; ICD pocket well healed Psych- euthymic mood, full affect Neuro- strength and sensation are intact  ICD interrogation- reviewed in detail today,  See PACEART report  EKG:  EKG is not ordered today.  Recent Labs: 10/18/2015: TSH 2.128 10/21/2015: ALT 26 01/30/2016: Hemoglobin 15.7; Platelets 138* 02/01/2016: BUN 13; Creatinine, Ser 1.29*; Potassium 4.2; Sodium 138   Wt Readings from Last 3 Encounters:  02/11/16 187 lb 9.6 oz (85.095 kg)  02/01/16 183 lb 13.8 oz (83.4 kg)  01/24/16 185 lb 11.2 oz (84.233 kg)     Other studies Reviewed: Additional studies/ records that were reviewed today include: hospital records  Assessment and Plan:  1.   Paroxysmal atrial fibrillation Doing well s/p Watchman implant Continue Xarelto and ASA '81mg'$  daily TEE 6 weeks post Watchman to evaluate LAA closure. Risks, benefits discussed with patient today who wishes to proceed.    2. Chronic systolic dysfunction euvolemic today Stable on an appropriate medical regimen Normal ICD function See Pace Art report No changes today  3.  HTN Stable No change required today  Current medicines are reviewed at length with the patient today.   The patient does not have concerns regarding his medicines.  The following changes were made today:  none  Labs/ tests ordered today include: BMET   Disposition:   Follow up with EP NP after 6 week TEE   Army Fossa MD  02/11/2016 8:41 AM  [facility] [address] [place] [address] [phone] (office) (  [phone] (fax)

## 2016-02-11 ENCOUNTER — Encounter: Payer: Self-pay | Admitting: Family Medicine

## 2016-02-11 ENCOUNTER — Ambulatory Visit (INDEPENDENT_AMBULATORY_CARE_PROVIDER_SITE_OTHER): Payer: Medicare Other | Admitting: Internal Medicine

## 2016-02-11 ENCOUNTER — Encounter: Payer: Self-pay | Admitting: Internal Medicine

## 2016-02-11 ENCOUNTER — Telehealth: Payer: Self-pay | Admitting: Internal Medicine

## 2016-02-11 ENCOUNTER — Ambulatory Visit (INDEPENDENT_AMBULATORY_CARE_PROVIDER_SITE_OTHER): Payer: Medicare Other | Admitting: Family Medicine

## 2016-02-11 VITALS — BP 138/84 | HR 72 | Ht 70.0 in | Wt 187.6 lb

## 2016-02-11 VITALS — BP 126/74 | HR 75 | Ht 70.0 in | Wt 187.0 lb

## 2016-02-11 DIAGNOSIS — I10 Essential (primary) hypertension: Secondary | ICD-10-CM | POA: Diagnosis not present

## 2016-02-11 DIAGNOSIS — I5022 Chronic systolic (congestive) heart failure: Secondary | ICD-10-CM | POA: Diagnosis not present

## 2016-02-11 DIAGNOSIS — R05 Cough: Secondary | ICD-10-CM | POA: Diagnosis not present

## 2016-02-11 DIAGNOSIS — I480 Paroxysmal atrial fibrillation: Secondary | ICD-10-CM

## 2016-02-11 DIAGNOSIS — I2510 Atherosclerotic heart disease of native coronary artery without angina pectoris: Secondary | ICD-10-CM | POA: Diagnosis not present

## 2016-02-11 DIAGNOSIS — R059 Cough, unspecified: Secondary | ICD-10-CM

## 2016-02-11 DIAGNOSIS — I4891 Unspecified atrial fibrillation: Secondary | ICD-10-CM | POA: Diagnosis not present

## 2016-02-11 LAB — CUP PACEART INCLINIC DEVICE CHECK
Date Time Interrogation Session: 20170313155407
Implantable Lead Location: 753860
Implantable Lead Model: 6935
Lead Channel Pacing Threshold Amplitude: 0.75 V
Lead Channel Pacing Threshold Amplitude: 1.125 V
Lead Channel Pacing Threshold Pulse Width: 0.4 ms
Lead Channel Pacing Threshold Pulse Width: 0.4 ms
Lead Channel Sensing Intrinsic Amplitude: 4.6 mV
Lead Channel Setting Pacing Amplitude: 2 V
Lead Channel Setting Sensing Sensitivity: 0.3 mV
MDC IDC LEAD IMPLANT DT: 20140914
MDC IDC LEAD IMPLANT DT: 20140914
MDC IDC LEAD LOCATION: 753859
MDC IDC MSMT LEADCHNL RA IMPEDANCE VALUE: 456 Ohm
MDC IDC MSMT LEADCHNL RA SENSING INTR AMPL: 2 mV
MDC IDC MSMT LEADCHNL RV IMPEDANCE VALUE: 437 Ohm
MDC IDC SET LEADCHNL RV PACING AMPLITUDE: 2.5 V
MDC IDC SET LEADCHNL RV PACING PULSEWIDTH: 0.4 ms
MDC IDC STAT BRADY AP VP PERCENT: 0.1 %
MDC IDC STAT BRADY AP VS PERCENT: 0.2 %
MDC IDC STAT BRADY AS VP PERCENT: 2.7 %
MDC IDC STAT BRADY AS VS PERCENT: 97 %

## 2016-02-11 LAB — BASIC METABOLIC PANEL
BUN: 15 mg/dL (ref 7–25)
CALCIUM: 9.1 mg/dL (ref 8.6–10.3)
CHLORIDE: 104 mmol/L (ref 98–110)
CO2: 29 mmol/L (ref 20–31)
Creat: 0.81 mg/dL (ref 0.70–1.18)
Glucose, Bld: 99 mg/dL (ref 65–99)
POTASSIUM: 4.2 mmol/L (ref 3.5–5.3)
SODIUM: 138 mmol/L (ref 135–146)

## 2016-02-11 MED ORDER — ALBUTEROL SULFATE (2.5 MG/3ML) 0.083% IN NEBU
2.5000 mg | INHALATION_SOLUTION | Freq: Once | RESPIRATORY_TRACT | Status: AC
  Administered 2016-02-11: 2.5 mg via RESPIRATORY_TRACT

## 2016-02-11 NOTE — Telephone Encounter (Signed)
New message      Pt was seen today.  Is pt supposted to be taking prevastatin?  Please call

## 2016-02-11 NOTE — Progress Notes (Signed)
   Subjective:    Patient ID: Steven Everett, male    DOB: 1935/02/01, 81 y.o.   MRN: [id number]  HPI  Recent CXR shows : Postoperative change on the right with volume loss and chronic inferior right-sided pleural thickening. Prior history of lung cancer in November 2008.  He's not sure if he she be taking his pravastatin. It was taken off of his medication list during his recent hospitalization where he had a watchman placed. He is excited that he will be coming off of anticoagulation saying.  Review of Systems     Objective:   Physical Exam  Constitutional: He is oriented to person, place, and time. He appears well-developed and well-nourished.  HENT:  Head: Normocephalic and atraumatic.  Cardiovascular: Normal rate, regular rhythm and normal heart sounds.   Pulmonary/Chest: Effort normal and breath sounds normal.  Neurological: He is alert and oriented to person, place, and time.  Skin: Skin is warm and dry.  Psychiatric: He has a normal mood and affect. His behavior is normal.          Assessment & Plan:  Restrictive Lung Disease - Spirometry most consistent with restrictive lung disease which is not unusual considering that he's had part of the lung removed back in 2008 for lung cancer. He does have some thickening of the pleura on that side so I am going to refer him to pulmonology just to have them review everything and see if they recommend any changes. He likely would not benefit from an inhaler at this point in time. He walks 30 minutes a day and is happy with his ability to exercise and activity of daily living.  We will call his cardiologist office to find out why they do not want him to continue with his statin. It was evidently discontinued during his recent hospitalization but I see no note of why it was discontinued. I suspect that this could actually be an error side told him to hold onto the pravastatin until we find out if he should be taking it or  not.   Spirometry shows FVC of 60% and FEV1 of 62% with a ratio of 78%. No significant improvement postbronchodilator. Consistent with moderately severe restriction.

## 2016-02-11 NOTE — Patient Instructions (Addendum)
Medication Instructions:  Your physician recommends that you continue on your current medications as directed. Please refer to the Current Medication list given to you today.   Labwork: Your physician recommends that you return for lab work today: BMP   Testing/Procedures: Your physician has requested that you have a TEE. During a TEE, sound waves are used to create images of your heart. It provides your doctor with information about the size and shape of your heart and how well your heart's chambers and valves are working. In this test, a transducer is attached to the end of a flexible tube that's guided down your throat and into your esophagus (the tube leading from you mouth to your stomach) to get a more detailed image of your heart. You are not awake for the procedure. Please see the instruction sheet given to you today. For further information please visit [email or web address].  Please [facility] at the [facility] on 03/20/16 at 8:00am for your TEE.  Do not eat or drink after midnight and do not take any medications the morning of your procedure.    You will need some one to drive you home after procedure  Pre-cert sent   Follow-Up: Your physician recommends that you schedule a follow-up appointment in: 8 weeks with Chanetta Marshall, NP   Any Other Special Instructions Will Be Listed Below (If Applicable).     If you need a refill on your cardiac medications before your next appointment, please call your pharmacy.

## 2016-02-12 NOTE — Telephone Encounter (Signed)
Ok to resume.  I'm not sure why it wasn't on home med list for reconciliation at discharge  Chanetta Marshall, NP 02/12/2016 4:43 PM

## 2016-02-12 NOTE — Telephone Encounter (Signed)
Per the office note he is taking.  This is the list taht was gone over with while her for OV.

## 2016-02-12 NOTE — Telephone Encounter (Signed)
When Pt was in our office with PCP this week, he stated the Pravastatin was discontinued while he was in the hospital and he never restarted it. Should he restart?

## 2016-02-12 NOTE — Telephone Encounter (Signed)
I see on his H&P but not on discharge summary and can not see where it was stopped.  I will forward to Chanetta Marshall, NP to review as she was closely involved in his care.

## 2016-02-13 NOTE — Telephone Encounter (Signed)
Rx added back to med list and Pt notified. Verbalized understanding.

## 2016-02-13 NOTE — Telephone Encounter (Signed)
Please see note.

## 2016-02-13 NOTE — Progress Notes (Signed)
EPIC Encounter for ICM Monitoring  Patient Name: Steven Everett is a 81 y.o. male Date: 02/13/2016 Primary Care Physican: Beatrice Lecher, MD Primary Cardiologist: Stanford Breed Electrophysiologist: Allred Dry Weight: unknown   In the past month, have you:  1. Gained more than 2 pounds in a day or more than 5 pounds in a week? N/A  2. Had changes in your medications (with verification of current medications)? N/A  3. Had more shortness of breath than is usual for you? N/A  4. Limited your activity because of shortness of breath? N/A  5. Not been able to sleep because of shortness of breath? N/A  6. Had increased swelling in your feet or ankles? N/A  7. Had symptoms of dehydration (dizziness, dry mouth, increased thirst, decreased urine output) N/A  8. Had changes in sodium restriction? N/A  9. Been compliant with medication? N/A   ICM trend: 3 month view for 01/31/2016   ICM trend: 1 year view for 01/31/2016   Follow-up plan: ICM clinic phone appointment 03/18/2016.  Transmission reviewed.  Thoracic impedance trending along reference line suggesting stable fluid levels.  No changes today.     Rosalene Billings, RN, CCM 02/13/2016 9:01 AM

## 2016-02-13 NOTE — Telephone Encounter (Signed)
Ok, please tell patient to restart med and add back to his med list.

## 2016-02-25 ENCOUNTER — Telehealth: Payer: Self-pay

## 2016-02-25 ENCOUNTER — Other Ambulatory Visit: Payer: Self-pay | Admitting: Family Medicine

## 2016-02-25 NOTE — Telephone Encounter (Signed)
Xarelto 20 mg 3 bottles provided to patient today. He states he may be able to get off of it soon.

## 2016-03-10 ENCOUNTER — Other Ambulatory Visit: Payer: Self-pay | Admitting: Cardiology

## 2016-03-10 NOTE — Telephone Encounter (Signed)
Rx request sent to pharmacy.  

## 2016-03-18 ENCOUNTER — Ambulatory Visit (INDEPENDENT_AMBULATORY_CARE_PROVIDER_SITE_OTHER): Payer: Medicare Other

## 2016-03-18 ENCOUNTER — Telehealth: Payer: Self-pay | Admitting: Internal Medicine

## 2016-03-18 DIAGNOSIS — I5022 Chronic systolic (congestive) heart failure: Secondary | ICD-10-CM | POA: Diagnosis not present

## 2016-03-18 DIAGNOSIS — Z95810 Presence of automatic (implantable) cardiac defibrillator: Secondary | ICD-10-CM | POA: Diagnosis not present

## 2016-03-18 NOTE — Progress Notes (Signed)
EPIC Encounter for ICM Monitoring  Patient Name: Steven Everett is a 81 y.o. male Date: 03/18/2016 Primary Care Physican: Beatrice Lecher, MD Primary Cardiologist: Stanford Breed Electrophysiologist: Allred Dry Weight: 185 lbs (checks at gym)   In the past month, have you:  1. Gained more than 2 pounds in a day or more than 5 pounds in a week? No  2. Had changes in your medications (with verification of current medications)? No  3. Had more shortness of breath than is usual for you? No  4. Limited your activity because of shortness of breath? No  5. Not been able to sleep because of shortness of breath? No  6. Had increased swelling in your feet or ankles? No  7. Had symptoms of dehydration (dizziness, dry mouth, increased thirst, decreased urine output) No  8. Had changes in sodium restriction? No  9. Been compliant with medication? No   ICM trend: 3 month view for 03/18/2016   ICM trend: 1 year view for 03/18/2016   Follow-up plan: ICM clinic phone appointment on 03/31/2016 and office appointment with [name] on 04/14/2016.  Spoke with patient. Transmission reviewed.  Thoracic impedance below reference line from 02/25/2016 to 03/06/2016 and 03/12/2016 to 03/18/2016 suggesting fluid accumulation.  He denied any symptoms and stated he is feeling fine.  Reviewed fluid symptoms to report.  Advised to check food labels for salt content and limit to 2000 mg daily and 64 oz daily.  Patient currently not prescribed diuretic.   Advised would send to Dr Stanford Breed and [name] for review and will call back if any recommendations.  Patient asymptomatic and currently not prescribed diuretic.  Repeat transmission 03/31/2016.     Copy of note sent to patient's primary care physician, primary cardiologist, and device following physician.  Rosalene Billings, RN, CCM 03/18/2016 2:01 PM

## 2016-03-18 NOTE — Telephone Encounter (Signed)
called pt and informed him that samples up front, placing PA papers with meds

## 2016-03-18 NOTE — Telephone Encounter (Signed)
New message     Patient calling the office for samples of medication:   1.  What medication and dosage are you requesting samples for? xarleto  20 mg   2.  Are you currently out of this medication? Yes

## 2016-03-19 ENCOUNTER — Other Ambulatory Visit: Payer: Self-pay | Admitting: Nurse Practitioner

## 2016-03-19 NOTE — Progress Notes (Signed)
Returned patients call and he stated he was exercising at this time and requested call back after 1:30 pm

## 2016-03-20 ENCOUNTER — Encounter (HOSPITAL_COMMUNITY)
Admission: RE | Disposition: A | Discharge status: Home / Self Care | Payer: Self-pay | Source: Ambulatory Visit | Attending: Cardiology

## 2016-03-20 ENCOUNTER — Ambulatory Visit (HOSPITAL_BASED_OUTPATIENT_CLINIC_OR_DEPARTMENT_OTHER): Payer: Medicare Other

## 2016-03-20 ENCOUNTER — Ambulatory Visit (HOSPITAL_COMMUNITY)
Admission: RE | Admit: 2016-03-20 | Discharge: 2016-03-20 | Disposition: A | Discharge status: Home / Self Care | Payer: Medicare Other | Source: Ambulatory Visit | Attending: Cardiology | Admitting: Cardiology

## 2016-03-20 ENCOUNTER — Encounter (HOSPITAL_COMMUNITY): Payer: Self-pay | Admitting: *Deleted

## 2016-03-20 DIAGNOSIS — I340 Nonrheumatic mitral (valve) insufficiency: Secondary | ICD-10-CM

## 2016-03-20 DIAGNOSIS — E119 Type 2 diabetes mellitus without complications: Secondary | ICD-10-CM | POA: Insufficient documentation

## 2016-03-20 DIAGNOSIS — I5022 Chronic systolic (congestive) heart failure: Secondary | ICD-10-CM | POA: Insufficient documentation

## 2016-03-20 DIAGNOSIS — Z96641 Presence of right artificial hip joint: Secondary | ICD-10-CM | POA: Diagnosis not present

## 2016-03-20 DIAGNOSIS — E785 Hyperlipidemia, unspecified: Secondary | ICD-10-CM | POA: Diagnosis not present

## 2016-03-20 DIAGNOSIS — I351 Nonrheumatic aortic (valve) insufficiency: Secondary | ICD-10-CM | POA: Diagnosis not present

## 2016-03-20 DIAGNOSIS — I480 Paroxysmal atrial fibrillation: Secondary | ICD-10-CM | POA: Diagnosis not present

## 2016-03-20 DIAGNOSIS — I110 Hypertensive heart disease with heart failure: Secondary | ICD-10-CM | POA: Insufficient documentation

## 2016-03-20 DIAGNOSIS — I4891 Unspecified atrial fibrillation: Secondary | ICD-10-CM

## 2016-03-20 DIAGNOSIS — Z95810 Presence of automatic (implantable) cardiac defibrillator: Secondary | ICD-10-CM | POA: Insufficient documentation

## 2016-03-20 DIAGNOSIS — E039 Hypothyroidism, unspecified: Secondary | ICD-10-CM | POA: Diagnosis not present

## 2016-03-20 DIAGNOSIS — Z7901 Long term (current) use of anticoagulants: Secondary | ICD-10-CM | POA: Diagnosis not present

## 2016-03-20 DIAGNOSIS — Z7982 Long term (current) use of aspirin: Secondary | ICD-10-CM | POA: Insufficient documentation

## 2016-03-20 DIAGNOSIS — Z87891 Personal history of nicotine dependence: Secondary | ICD-10-CM | POA: Diagnosis not present

## 2016-03-20 DIAGNOSIS — Z85118 Personal history of other malignant neoplasm of bronchus and lung: Secondary | ICD-10-CM | POA: Insufficient documentation

## 2016-03-20 HISTORY — PX: TEE WITHOUT CARDIOVERSION: SHX5443

## 2016-03-20 SURGERY — ECHOCARDIOGRAM, TRANSESOPHAGEAL
Anesthesia: Moderate Sedation

## 2016-03-20 MED ORDER — MIDAZOLAM HCL 10 MG/2ML IJ SOLN
INTRAMUSCULAR | Status: DC | PRN
  Administered 2016-03-20: 2 mg via INTRAVENOUS

## 2016-03-20 MED ORDER — MIDAZOLAM HCL 5 MG/ML IJ SOLN
INTRAMUSCULAR | Status: AC
  Filled 2016-03-20: qty 2

## 2016-03-20 MED ORDER — FENTANYL CITRATE (PF) 100 MCG/2ML IJ SOLN
INTRAMUSCULAR | Status: AC
  Filled 2016-03-20: qty 2

## 2016-03-20 MED ORDER — FENTANYL CITRATE (PF) 100 MCG/2ML IJ SOLN
INTRAMUSCULAR | Status: DC | PRN
  Administered 2016-03-20: 25 ug via INTRAVENOUS

## 2016-03-20 MED ORDER — BUTAMBEN-TETRACAINE-BENZOCAINE 2-2-14 % EX AERO
INHALATION_SPRAY | CUTANEOUS | Status: DC | PRN
  Administered 2016-03-20: 2 via TOPICAL

## 2016-03-20 MED ORDER — SODIUM CHLORIDE 0.9 % IV SOLN
INTRAVENOUS | Status: DC
  Administered 2016-03-20: 08:00:00 via INTRAVENOUS

## 2016-03-20 NOTE — Discharge Instructions (Signed)
Transesophageal Echocardiogram Transesophageal echocardiography (TEE) is a picture test of your heart using sound waves. The pictures taken can give very detailed pictures of your heart. This can help your doctor see if there are problems with your heart. TEE can check:  If your heart has blood clots in it.  How well your heart valves are working.  If you have an infection on the inside of your heart.  Some of the major arteries of your heart.  If your heart valve is working after a Office manager.  Your heart before a procedure that uses a shock to your heart to get the rhythm back to normal. BEFORE THE PROCEDURE  Do not eat or drink for 6 hours before the procedure or as told by your doctor.  Make plans to have someone drive you home after the procedure. Do not drive yourself home.  An IV tube will be put in your arm. PROCEDURE  You will be given a medicine to help you relax (sedative). It will be given through the IV tube.  A numbing medicine will be sprayed or gargled in the back of your throat to help numb it.  The tip of the probe is placed into the back of your mouth. You will be asked to swallow. This helps to pass the probe into your esophagus.  Once the tip of the probe is in the right place, your doctor can take pictures of your heart.  You may feel pressure at the back of your throat. AFTER THE PROCEDURE  You will be taken to a recovery area so the sedative can wear off.  Your throat may be sore and scratchy. This will go away slowly over time.  You will go home when you are fully awake and able to swallow liquids.  You should have someone stay with you for the next 24 hours.  Do not drive or operate machinery for the next 24 hours.   This information is not intended to replace advice given to you by your health care provider. Make sure you discuss any questions you have with your health care provider.   Document Released: 09/14/2009 Document Revised: 11/22/2013  Document Reviewed: 05/19/2013 Elsevier Interactive Patient Education 2016 [place].    Moderate Conscious Sedation, Adult, Care After Refer to this sheet in the next few weeks. These instructions provide you with information on caring for yourself after your procedure. Your health care provider may also give you more specific instructions. Your treatment has been planned according to current medical practices, but problems sometimes occur. Call your health care provider if you have any problems or questions after your procedure. WHAT TO EXPECT AFTER THE PROCEDURE  After your procedure:  You may feel sleepy, clumsy, and have poor balance for several hours.  Vomiting may occur if you eat too soon after the procedure. HOME CARE INSTRUCTIONS  Do not participate in any activities where you could become injured for at least 24 hours. Do not:  Drive.  Swim.  Ride a bicycle.  Operate heavy machinery.  Cook.  Use power tools.  Climb ladders.  Work from a high place.  Do not make important decisions or sign legal documents until you are improved.  If you vomit, drink water, juice, or soup when you can drink without vomiting. Make sure you have little or no nausea before eating solid foods.  Only take over-the-counter or prescription medicines for pain, discomfort, or fever as directed by your health care provider.  Make sure you  and your family fully understand everything about the medicines given to you, including what side effects may occur.  You should not drink alcohol, take sleeping pills, or take medicines that cause drowsiness for at least 24 hours.  If you smoke, do not smoke without supervision.  If you are feeling better, you may resume normal activities 24 hours after you were sedated.  Keep all appointments with your health care provider. SEEK MEDICAL CARE IF:  Your skin is pale or bluish in color.  You continue to feel nauseous or vomit.  Your pain is  getting worse and is not helped by medicine.  You have bleeding or swelling.  You are still sleepy or feeling clumsy after 24 hours. SEEK IMMEDIATE MEDICAL CARE IF:  You develop a rash.  You have difficulty breathing.  You develop any type of allergic problem.  You have a fever. MAKE SURE YOU:  Understand these instructions.  Will watch your condition.  Will get help right away if you are not doing well or get worse.   This information is not intended to replace advice given to you by your health care provider. Make sure you discuss any questions you have with your health care provider.   Document Released: 09/07/2013 Document Revised: 12/08/2014 Document Reviewed: 09/07/2013 Elsevier Interactive Patient Education [employer].

## 2016-03-20 NOTE — CV Procedure (Signed)
See full TEE report in [employer].  Kirk Ruths

## 2016-03-20 NOTE — Interval H&P Note (Signed)
History and Physical Interval Note:  03/20/2016 8:45 AM  Steven Everett  has presented today for surgery, with the diagnosis of A FIB   The various methods of treatment have been discussed with the patient and family. After consideration of risks, benefits and other options for treatment, the patient has consented to  Procedure(s): TRANSESOPHAGEAL ECHOCARDIOGRAM (TEE) (N/A) as a surgical intervention .  The patient's history has been reviewed, patient examined, no change in status, stable for surgery.  I have reviewed the patient's chart and labs.  Questions were answered to the patient's satisfaction.     Kirk Ruths

## 2016-03-20 NOTE — H&P (Signed)
02/11/2016 8:30 AM  Office Visit  MRN:  [id number]   Description: Male DOB: 1935-03-31  Provider: Thompson Grayer, MD  Department: [address] [facility]       Vital Signs  Most recent update: 02/11/2016 8:18 AM by Zebedee Iba, CMA    BP Pulse Ht Wt BMI    138/84 mmHg 72 '5\' 10"'$  (1.778 m) 187 lb 9.6 oz (85.095 kg) 26.92 kg/m2      Progress Notes      Thompson Grayer, MD at 02/09/2016 3:51 PM     Status: Signed       Expand All Collapse All      Electrophysiology Office Note Date: 02/11/2016  ID: Steven Everett, DOB 11-Feb-1935, MRN [id number]  PCP: Beatrice Lecher, MD Primary Cardiologist: Stanford Breed Electrophysiologist: Allred  CC: post Watchman follow up  Steven Everett is a 81 y.o. male seen today post Watchman implant. Since discharge, the patient reports doing very well. He denies chest pain, palpitations, dyspnea, PND, orthopnea, nausea, vomiting, dizziness, syncope, edema, weight gain, or early satiety. He has not had groin complications. He has not had ICD shocks.   Device History: MDT dual chamber ICD implanted 2014 for primary prevention History of appropriate therapy: no History of AAD therapy: no   Past Medical History  Diagnosis Date  . Hemorrhoids, internal   . Diverticulosis of colon   . Barrett's esophagus   . Cirrhosis of liver (HCC)     w/o mention of alcohol  . Seborrheic dermatitis   . Hyperlipidemia   . Hypothyroidism   . Hypertension   . GERD (gastroesophageal reflux disease)   . Rheumatic fever   . Aortic stenosis   . Paroxysmal atrial fibrillation ([place]) 12/14/13    detected on ICD interrogation  . Ischemic cardiomyopathy   . Chronic systolic dysfunction of left ventricle   . Sinus node dysfunction (HCC)   . AICD (automatic cardioverter/defibrillator) present   . Heart murmur   . Type II diabetes mellitus ([place])     "weighed over 300#; lost weight; lost diabetes"  (01/31/2016)  . Cancer of right lung [facility])     lower lobes   Past Surgical History  Procedure Laterality Date  . Lung removal, partial Right 1998    for lung cancer by Dr Rollene Fare; middle and lower lobes  . Back surgery    . Total hip arthroplasty Right     Dr Alvan Dame  . Fracture surgery    . Cataract extraction, bilateral Bilateral 8-11    Dr Ayesha Rumpf  . Colonoscopy    . Tee without cardioversion N/A 02/14/2013    Procedure: TRANSESOPHAGEAL ECHOCARDIOGRAM (TEE); Surgeon: Lelon Perla, MD; Location: [facility] ENDOSCOPY; Service: Cardiovascular; Laterality: N/A;  . Cardiac pacemaker placement    . Cardiac defibrillator placement  08/12/13    Medtronic Evera XT DR implanted by [name] for primary prevention of sudden death  . Implantable cardioverter defibrillator implant N/A 08/12/2013    Procedure: IMPLANTABLE CARDIOVERTER DEFIBRILLATOR IMPLANT; Surgeon: Coralyn Mark, MD; Location: [facility] CATH LAB; Service: Cardiovascular; Laterality: N/A;  . Tee without cardioversion N/A 01/09/2016    Procedure: TRANSESOPHAGEAL ECHOCARDIOGRAM (TEE); Surgeon: Josue Hector, MD; Location: [facility] ENDOSCOPY; Service: Cardiovascular; Laterality: N/A;  . Cardiac catheterization  "years ago"  . Appendectomy  1940s  . Joint replacement    . Lumbar disc surgery  X 2    "herniated discs"  . Ankle fracture surgery Right 1940s  . Electrophysiologic study N/A 01/31/2016    Procedure: LEFT ATRIAL  APPENDAGE OCCLUSION; Surgeon: Thompson Grayer, MD; Location: [place] CV LAB; Service: Cardiovascular; Laterality: N/A;    Current Outpatient Prescriptions  Medication Sig Dispense Refill  . aspirin EC 81 MG tablet Take 1 tablet (81 mg total) by mouth daily.    . carvedilol (COREG) 6.25 MG tablet TAKE ONE TABLET TWICE DAILY WITH MEALS 60 tablet 2  . levothyroxine (SYNTHROID, LEVOTHROID) 112 MCG  tablet Take 1 tablet (112 mcg total) by mouth daily. APPOINTMENT NEEDED FOR FURTHER REFILLS 30 tablet 1  . lisinopril (PRINIVIL,ZESTRIL) 5 MG tablet Take 1 tablet (5 mg total) by mouth daily. Restart on 02/03/16 30 tablet 10  . rivaroxaban (XARELTO) 20 MG TABS tablet Take 1 tablet (20 mg total) by mouth daily with supper. 20 tablet 0   No current facility-administered medications for this visit.    Allergies: Atorvastatin   Social History: Social History   Social History  . Marital Status: Widowed    Spouse Name: Hoyle Sauer  . Number of Children: 1  . Years of Education: N/A   Occupational History  .     Social History Main Topics  . Smoking status: Former Smoker -- 2.00 packs/day for 36 years    Types: Cigarettes    Quit date: 02/14/1989  . Smokeless tobacco: Never Used  . Alcohol Use: No  . Drug Use: No  . Sexual Activity: Not Currently   Other Topics Concern  . Not on file   Social History Narrative    Family History: Family History  Problem Relation Age of Onset  . Cancer Mother     breast  . Diabetes Mother   . Cancer Father     Lung  . Diabetes      Review of Systems: All other systems reviewed and are otherwise negative except as noted above.   Physical Exam: VS: BP 138/84 mmHg  Pulse 72  Ht '5\' 10"'$  (1.778 m)  Wt 187 lb 9.6 oz (85.095 kg)  BMI 26.92 kg/m2 , BMI Body mass index is 26.92 kg/(m^2).  GEN- The patient is well appearing, alert and oriented x 3 today.  HEENT: normocephalic, atraumatic; sclera clear, conjunctiva pink; hearing intact; oropharynx clear; neck supple Lungs- Clear to ausculation bilaterally, normal work of breathing. No wheezes, rales, rhonchi Heart- Regular rate and rhythm, no murmurs, rubs or gallops GI- soft, non-tender, non-distended, bowel sounds present Extremities- no clubbing, cyanosis, or edema; DP/PT/radial pulses 2+  bilaterally MS- no significant deformity or atrophy Skin- warm and dry, no rash or lesion; ICD pocket well healed Psych- euthymic mood, full affect Neuro- strength and sensation are intact  ICD interrogation- reviewed in detail today, See PACEART report  EKG: EKG is not ordered today.  Recent Labs: 10/18/2015: TSH 2.128 10/21/2015: ALT 26 01/30/2016: Hemoglobin 15.7; Platelets 138* 02/01/2016: BUN 13; Creatinine, Ser 1.29*; Potassium 4.2; Sodium 138   Wt Readings from Last 3 Encounters:  02/11/16 187 lb 9.6 oz (85.095 kg)  02/01/16 183 lb 13.8 oz (83.4 kg)  01/24/16 185 lb 11.2 oz (84.233 kg)     Other studies Reviewed: Additional studies/ records that were reviewed today include: hospital records  Assessment and Plan:  1. Paroxysmal atrial fibrillation Doing well s/p Watchman implant Continue Xarelto and ASA '81mg'$  daily TEE 6 weeks post Watchman to evaluate LAA closure. Risks, benefits discussed with patient today who wishes to proceed.   2. Chronic systolic dysfunction euvolemic today Stable on an appropriate medical regimen Normal ICD function See Pace Art report No changes today  3.  HTN Stable No change required today  Current medicines are reviewed at length with the patient today.  The patient does not have concerns regarding his medicines. The following changes were made today: none  Labs/ tests ordered today include: BMET   Disposition: Follow up with EP NP after 6 week TEE   Army Fossa MD  02/11/2016 8:41 AM  [facility] [address] [place] [address] [phone] (office) [phone] (fax)       For TEE post watchman; no changes. Kirk Ruths

## 2016-03-21 ENCOUNTER — Encounter (HOSPITAL_COMMUNITY): Payer: Self-pay | Admitting: Cardiology

## 2016-03-25 ENCOUNTER — Telehealth: Payer: Self-pay | Admitting: Nurse Practitioner

## 2016-03-25 MED ORDER — ASPIRIN EC 325 MG PO TBEC: 325.0000 mg | DELAYED_RELEASE_TABLET | Freq: Every day | ORAL | Status: DC

## 2016-03-25 MED ORDER — CLOPIDOGREL BISULFATE 75 MG PO TABS: 75.0000 mg | ORAL_TABLET | Freq: Every day | ORAL | Status: DC

## 2016-03-25 NOTE — Telephone Encounter (Signed)
Post Watchman TEE reviewed. No leak, device well seated. Spoke with patient and informed of results.  Discontinue Xarelto at this time. Start Plavix '75mg'$  daily and increase ASA to '325mg'$  daily. Rx sent in to pharmacy. Pt aware and agrees with plan. Will keep follow up as scheduled in May with [name].  Chanetta Marshall, NP 03/25/2016 8:34 AM

## 2016-03-31 ENCOUNTER — Ambulatory Visit (INDEPENDENT_AMBULATORY_CARE_PROVIDER_SITE_OTHER): Payer: Medicare Other

## 2016-03-31 ENCOUNTER — Telehealth: Payer: Self-pay | Admitting: Cardiology

## 2016-03-31 DIAGNOSIS — I420 Dilated cardiomyopathy: Secondary | ICD-10-CM

## 2016-03-31 DIAGNOSIS — Z95810 Presence of automatic (implantable) cardiac defibrillator: Secondary | ICD-10-CM

## 2016-03-31 DIAGNOSIS — I5022 Chronic systolic (congestive) heart failure: Secondary | ICD-10-CM

## 2016-03-31 NOTE — Telephone Encounter (Signed)
LMOVM reminding pt to send remote transmission.   

## 2016-04-01 NOTE — Progress Notes (Signed)
EPIC Encounter for ICM Monitoring  Patient Name: Steven Everett is a 81 y.o. male Date: 04/01/2016 Primary Care Physican: Beatrice Lecher, MD Primary Cardiologist: Stanford Breed Electrophysiologist: Allred Dry Weight: Checks at gym   In the past month, have you:  1. Gained more than 2 pounds in a day or more than 5 pounds in a week? no  2. Had changes in your medications (with verification of current medications)? no  3. Had more shortness of breath than is usual for you? no  4. Limited your activity because of shortness of breath? no  5. Not been able to sleep because of shortness of breath? no  6. Had increased swelling in your feet or ankles? no  7. Had symptoms of dehydration (dizziness, dry mouth, increased thirst, decreased urine output) no  8. Had changes in sodium restriction? no  9. Been compliant with medication? Yes  ICM trend: 3 month view for 03/31/2016  ICM trend: 1 year view for 03/31/2016    Follow-up plan: ICM clinic phone appointment 05/15/2016 and office appointment with [name] 04/14/2016.    FLUID LEVELS:  Since last ICM transmission on 03/18/2016, Optivol thoracic impedance improved and is trending along baseline suggesting stable fluid levels.   SYMPTOMS:   None. Encouraged to call for any fluid symptoms.   He reported he had Watchman procedure done and it went very well.  He stated he feels good at this time.  EDUCATION:   Limit sodium intake to < 2000 mg and fluid intake to 64 oz daily.   No changes today.    Rosalene Billings, RN, CCM 04/01/2016 9:05 AM

## 2016-04-01 NOTE — Progress Notes (Signed)
Remote ICD transmission.   

## 2016-04-07 ENCOUNTER — Other Ambulatory Visit: Payer: Self-pay | Admitting: Cardiology

## 2016-04-07 NOTE — Telephone Encounter (Signed)
Rx Refill

## 2016-04-14 ENCOUNTER — Encounter: Payer: Self-pay | Admitting: Internal Medicine

## 2016-04-14 ENCOUNTER — Ambulatory Visit (INDEPENDENT_AMBULATORY_CARE_PROVIDER_SITE_OTHER): Payer: Medicare Other | Admitting: Internal Medicine

## 2016-04-14 VITALS — BP 144/76 | HR 82 | Ht 70.0 in | Wt 186.2 lb

## 2016-04-14 DIAGNOSIS — I5022 Chronic systolic (congestive) heart failure: Secondary | ICD-10-CM | POA: Diagnosis not present

## 2016-04-14 DIAGNOSIS — I495 Sick sinus syndrome: Secondary | ICD-10-CM | POA: Diagnosis not present

## 2016-04-14 DIAGNOSIS — I2510 Atherosclerotic heart disease of native coronary artery without angina pectoris: Secondary | ICD-10-CM

## 2016-04-14 DIAGNOSIS — I481 Persistent atrial fibrillation: Secondary | ICD-10-CM | POA: Diagnosis not present

## 2016-04-14 DIAGNOSIS — I4819 Other persistent atrial fibrillation: Secondary | ICD-10-CM

## 2016-04-14 LAB — CUP PACEART INCLINIC DEVICE CHECK
Battery Remaining Longevity: 104 mo
Battery Voltage: 2.99 V
Brady Statistic AP VP Percent: 0.25 %
Brady Statistic AS VS Percent: 93.93 %
Brady Statistic RV Percent Paced: 4.52 %
HIGH POWER IMPEDANCE MEASURED VALUE: 65 Ohm
Implantable Lead Implant Date: 20140914
Implantable Lead Location: 753860
Implantable Lead Model: 6935
Lead Channel Impedance Value: 285 Ohm
Lead Channel Impedance Value: 437 Ohm
Lead Channel Pacing Threshold Amplitude: 1 V
Lead Channel Pacing Threshold Pulse Width: 0.4 ms
Lead Channel Pacing Threshold Pulse Width: 0.4 ms
Lead Channel Sensing Intrinsic Amplitude: 2.125 mV
Lead Channel Setting Pacing Pulse Width: 0.4 ms
MDC IDC LEAD IMPLANT DT: 20140914
MDC IDC LEAD LOCATION: 753859
MDC IDC MSMT LEADCHNL RA IMPEDANCE VALUE: 494 Ohm
MDC IDC MSMT LEADCHNL RV PACING THRESHOLD AMPLITUDE: 0.75 V
MDC IDC MSMT LEADCHNL RV SENSING INTR AMPL: 4.625 mV
MDC IDC SESS DTM: 20170515162802
MDC IDC SET LEADCHNL RA PACING AMPLITUDE: 2 V
MDC IDC SET LEADCHNL RV PACING AMPLITUDE: 2.5 V
MDC IDC SET LEADCHNL RV SENSING SENSITIVITY: 0.3 mV
MDC IDC STAT BRADY AP VS PERCENT: 1.55 %
MDC IDC STAT BRADY AS VP PERCENT: 4.27 %
MDC IDC STAT BRADY RA PERCENT PACED: 1.8 %

## 2016-04-14 NOTE — Patient Instructions (Signed)
Medication Instructions:  Your physician recommends that you continue on your current medications as directed. Please refer to the Current Medication list given to you today.   Labwork: None ordered   Testing/Procedures: None ordered   Follow-Up: Your physician wants you to follow-up in: September with Chanetta Marshall, NP You will receive a reminder letter in the mail two months in advance. If you don't receive a letter, please call our office to schedule the follow-up appointment.  Remote monitoring is used to monitor your  ICD from home. This monitoring reduces the number of office visits required to check your device to one time per year. It allows [place] to keep an eye on the functioning of your device to ensure it is working properly. You are scheduled for a device check from home on 07/14/16. You may send your transmission at any time that day. If you have a wireless device, the transmission will be sent automatically. After your physician reviews your transmission, you will receive a postcard with your next transmission date.     Any Other Special Instructions Will Be Listed Below (If Applicable).     If you need a refill on your cardiac medications before your next appointment, please call your pharmacy.

## 2016-04-14 NOTE — Progress Notes (Signed)
Electrophysiology Office Note Date: 04/14/2016  ID:  Steven Everett, Steven Everett [birth year], MRN [id number]  PCP: Beatrice Lecher, MD Primary Cardiologist: Stanford Breed Electrophysiologist: Malina Geers  CC: post Watchman follow up  Steven Everett is a 81 y.o. male seen today 6 weeks post Watchman implant. Since last being seen in clinic, the patient reports doing very well.  He is tolerating Plavix and ASA without bleeding complications.  He denies chest pain, palpitations, dyspnea, PND, orthopnea, nausea, vomiting, dizziness, syncope, edema, weight gain, or early satiety.  He has not had ICD shocks.   Device History: MDT dual chamber ICD implanted 2014 for primary prevention History of appropriate therapy: no History of AAD therapy: no   Past Medical History  Diagnosis Date  . Hemorrhoids, internal   . Diverticulosis of colon   . Barrett's esophagus   . Cirrhosis of liver (HCC)     w/o mention of alcohol  . Seborrheic dermatitis   . Hyperlipidemia   . Hypothyroidism   . Hypertension   . GERD (gastroesophageal reflux disease)   . Rheumatic fever   . Aortic stenosis   . Paroxysmal atrial fibrillation ([place]) 12/14/13    detected on ICD interrogation  . Ischemic cardiomyopathy   . Chronic systolic dysfunction of left ventricle   . Sinus node dysfunction (HCC)   . AICD (automatic cardioverter/defibrillator) present   . Heart murmur   . Type II diabetes mellitus ([place])     "weighed over 300#; lost weight; lost diabetes" (01/31/2016)  . Cancer of right lung [facility])     lower lobes   Past Surgical History  Procedure Laterality Date  . Lung removal, partial Right 1998    for lung cancer by Dr Rollene Fare; middle and lower lobes  . Back surgery    . Total hip arthroplasty Right     Dr Alvan Dame  . Fracture surgery    . Cataract extraction, bilateral Bilateral 8-11    Dr Ayesha Rumpf  . Colonoscopy    . Tee without cardioversion N/A 02/14/2013    Procedure: TRANSESOPHAGEAL ECHOCARDIOGRAM (TEE);  Surgeon:  Lelon Perla, MD;  Location: [facility] ENDOSCOPY;  Service: Cardiovascular;  Laterality: N/A;  . Cardiac pacemaker placement    . Cardiac defibrillator placement  08/12/13    Medtronic Evera XT DR implanted by [name] for primary prevention of sudden death  . Implantable cardioverter defibrillator implant N/A 08/12/2013    Procedure: IMPLANTABLE CARDIOVERTER DEFIBRILLATOR IMPLANT;  Surgeon: Coralyn Mark, MD;  Location: [facility] CATH LAB;  Service: Cardiovascular;  Laterality: N/A;  . Tee without cardioversion N/A 01/09/2016    Procedure: TRANSESOPHAGEAL ECHOCARDIOGRAM (TEE);  Surgeon: Josue Hector, MD;  Location: [facility] ENDOSCOPY;  Service: Cardiovascular;  Laterality: N/A;  . Cardiac catheterization  "years ago"  . Appendectomy  1940s  . Joint replacement    . Lumbar disc surgery  X 2    "herniated discs"  . Ankle fracture surgery Right 1940s  . Electrophysiologic study N/A 01/31/2016    Procedure: LEFT ATRIAL APPENDAGE OCCLUSION;  Surgeon: Thompson Grayer, MD;  Location: [place] CV LAB;  Service: Cardiovascular;  Laterality: N/A;  . Tee without cardioversion N/A 03/20/2016    Procedure: TRANSESOPHAGEAL ECHOCARDIOGRAM (TEE);  Surgeon: Lelon Perla, MD;  Location: [facility] ENDOSCOPY;  Service: Cardiovascular;  Laterality: N/A;    Current Outpatient Prescriptions  Medication Sig Dispense Refill  . aspirin EC 325 MG tablet Take 1 tablet (325 mg total) by mouth daily.    . carvedilol (COREG) 6.25 MG tablet  TAKE ONE TABLET TWICE DAILY WITH MEALS 60 tablet 4  . clopidogrel (PLAVIX) 75 MG tablet Take 1 tablet (75 mg total) by mouth daily. 90 tablet 3  . levothyroxine (SYNTHROID, LEVOTHROID) 112 MCG tablet Take 1 tablet (112 mcg total) by mouth daily before breakfast. 30 tablet 6  . lisinopril (PRINIVIL,ZESTRIL) 5 MG tablet Take 1 tablet (5 mg total) by mouth daily. Restart on 02/03/16 30 tablet 10  . pravastatin (PRAVACHOL) 40 MG tablet      No current facility-administered medications for this visit.     Allergies:   Atorvastatin   Social History: Social History   Social History  . Marital Status: Widowed    Spouse Name: Hoyle Sauer  . Number of Children: 1  . Years of Education: N/A   Occupational History  .     Social History Main Topics  . Smoking status: Former Smoker -- 2.00 packs/day for 36 years    Types: Cigarettes    Quit date: 02/14/1989  . Smokeless tobacco: Never Used  . Alcohol Use: No  . Drug Use: No  . Sexual Activity: Not Currently   Other Topics Concern  . Not on file   Social History Narrative    Family History: Family History  Problem Relation Age of Onset  . Cancer Mother     breast  . Diabetes Mother   . Cancer Father     Lung  . Diabetes      Review of Systems: All other systems reviewed and are otherwise negative except as noted above.   Physical Exam: VS:  BP 144/76 mmHg  Pulse 82  Ht '5\' 10"'$  (1.778 m)  Wt 186 lb 3.2 oz (84.46 kg)  BMI 26.72 kg/m2 , BMI Body mass index is 26.72 kg/(m^2).  GEN- The patient is well appearing, alert and oriented x 3 today.   HEENT: normocephalic, atraumatic; sclera clear, conjunctiva pink; hearing intact; oropharynx clear; neck supple Lungs- Clear to ausculation bilaterally, normal work of breathing.  Scattered expiratory wheezing Heart- Regular rate and rhythm, no murmurs, rubs or gallops GI- soft, non-tender, non-distended, bowel sounds present Extremities- no clubbing, cyanosis, or edema; DP/PT/radial pulses 2+ bilaterally MS- no significant deformity or atrophy Skin- warm and dry, no rash or lesion; ICD pocket well healed, scattered ecchymosis  Psych- euthymic mood, full affect Neuro- strength and sensation are intact  ICD interrogation- reviewed in detail today,  See PACEART report  EKG:  EKG is ordered today. Sinus rhythm with RBBB, LAFB  Recent Labs: 10/18/2015: TSH 2.128 10/21/2015: ALT 26 01/30/2016: Hemoglobin 15.7; Platelets 138* 02/11/2016: BUN 15; Creat 0.81; Potassium 4.2; Sodium  138   Wt Readings from Last 3 Encounters:  04/14/16 186 lb 3.2 oz (84.46 kg)  03/20/16 185 lb (83.915 kg)  02/11/16 187 lb (84.823 kg)     Other studies Reviewed: Additional studies/ records that were reviewed today include: hospital records  Assessment and Plan:  1.  Paroxysmal atrial fibrillation Doing well s/p Watchman implant Continue Plavix and ASA '325mg'$  until 08/2016  2. Chronic systolic dysfunction euvolemic today Stable on an appropriate medical regimen Normal ICD function See Pace Art report No changes today  3.  HTN Stable No change required today  Current medicines are reviewed at length with the patient today.   The patient does not have concerns regarding his medicines.  The following changes were made today:  none  Labs/ tests ordered today include: none   Disposition:   Follow up with EP NP in September  Army Fossa MD  04/14/2016 1:57 PM  [place] [address] [phone] (office) [phone] (fax)

## 2016-04-22 ENCOUNTER — Ambulatory Visit (INDEPENDENT_AMBULATORY_CARE_PROVIDER_SITE_OTHER): Payer: Medicare Other | Admitting: Family Medicine

## 2016-04-22 ENCOUNTER — Encounter: Payer: Self-pay | Admitting: Family Medicine

## 2016-04-22 VITALS — BP 121/57 | HR 63 | Wt 184.0 lb

## 2016-04-22 DIAGNOSIS — I481 Persistent atrial fibrillation: Secondary | ICD-10-CM

## 2016-04-22 DIAGNOSIS — I4819 Other persistent atrial fibrillation: Secondary | ICD-10-CM

## 2016-04-22 DIAGNOSIS — I2510 Atherosclerotic heart disease of native coronary artery without angina pectoris: Secondary | ICD-10-CM

## 2016-04-22 DIAGNOSIS — E038 Other specified hypothyroidism: Secondary | ICD-10-CM

## 2016-04-22 DIAGNOSIS — R7301 Impaired fasting glucose: Secondary | ICD-10-CM

## 2016-04-22 LAB — POCT GLYCOSYLATED HEMOGLOBIN (HGB A1C): Hemoglobin A1C: 5.3

## 2016-04-22 NOTE — Progress Notes (Signed)
Subjective:    Patient ID: Steven Everett, male    DOB: 22-Dec-1934, 81 y.o.   MRN: [id number]  HPI IFG - No increased thirst or urination. He still making it to the gym 3 days per week and working out very regularly.  Coronary artery disease-no recent chest pain or shortness of breath even with exercise. He is currently taking his aspirin and Plavix. Next  Atrial fibrillation-he had the watchman placed and is no longer on additional blood thinners and is very happy about that. He has not been symptomatic. Next  Hypothyroidism-taking his thyroid medication regularly on the stomach before breakfast without any palms or palpitations. He denies any recent skin or hair changes and no significant weight changes.   Review of Systems   BP 121/57 mmHg  Pulse 63  Wt 184 lb (83.462 kg)  SpO2 100%    Allergies  Allergen Reactions  . Atorvastatin Other (See Comments)    joint aches    Past Medical History  Diagnosis Date  . Hemorrhoids, internal   . Diverticulosis of colon   . Barrett's esophagus   . Cirrhosis of liver (HCC)     w/o mention of alcohol  . Seborrheic dermatitis   . Hyperlipidemia   . Hypothyroidism   . Hypertension   . GERD (gastroesophageal reflux disease)   . Rheumatic fever   . Aortic stenosis   . Paroxysmal atrial fibrillation ([place]) 12/14/13    detected on ICD interrogation  . Ischemic cardiomyopathy   . Chronic systolic dysfunction of left ventricle   . Sinus node dysfunction (HCC)   . AICD (automatic cardioverter/defibrillator) present   . Heart murmur   . Type II diabetes mellitus ([place])     "weighed over 300#; lost weight; lost diabetes" (01/31/2016)  . Cancer of right lung [facility])     lower lobes    Past Surgical History  Procedure Laterality Date  . Lung removal, partial Right 1998    for lung cancer by Dr Rollene Fare; middle and lower lobes  . Back surgery    . Total hip arthroplasty Right     Dr Alvan Dame  . Fracture surgery    . Cataract extraction,  bilateral Bilateral 8-11    Dr Ayesha Rumpf  . Colonoscopy    . Tee without cardioversion N/A 02/14/2013    Procedure: TRANSESOPHAGEAL ECHOCARDIOGRAM (TEE);  Surgeon: Lelon Perla, MD;  Location: [facility] ENDOSCOPY;  Service: Cardiovascular;  Laterality: N/A;  . Cardiac pacemaker placement    . Cardiac defibrillator placement  08/12/13    Medtronic Evera XT DR implanted by [name] for primary prevention of sudden death  . Implantable cardioverter defibrillator implant N/A 08/12/2013    Procedure: IMPLANTABLE CARDIOVERTER DEFIBRILLATOR IMPLANT;  Surgeon: Coralyn Mark, MD;  Location: [facility] CATH LAB;  Service: Cardiovascular;  Laterality: N/A;  . Tee without cardioversion N/A 01/09/2016    Procedure: TRANSESOPHAGEAL ECHOCARDIOGRAM (TEE);  Surgeon: Josue Hector, MD;  Location: [facility] ENDOSCOPY;  Service: Cardiovascular;  Laterality: N/A;  . Cardiac catheterization  "years ago"  . Appendectomy  1940s  . Joint replacement    . Lumbar disc surgery  X 2    "herniated discs"  . Ankle fracture surgery Right 1940s  . Electrophysiologic study N/A 01/31/2016    Procedure: LEFT ATRIAL APPENDAGE OCCLUSION;  Surgeon: Thompson Grayer, MD;  Location: [place] CV LAB;  Service: Cardiovascular;  Laterality: N/A;  . Tee without cardioversion N/A 03/20/2016    Procedure: TRANSESOPHAGEAL ECHOCARDIOGRAM (TEE);  Surgeon: Aaron Edelman  Jacalyn Lefevre, MD;  Location: [place] ENDOSCOPY;  Service: Cardiovascular;  Laterality: N/A;    Social History   Social History  . Marital Status: Widowed    Spouse Name: Hoyle Sauer  . Number of Children: 1  . Years of Education: N/A   Occupational History  .     Social History Main Topics  . Smoking status: Former Smoker -- 2.00 packs/day for 36 years    Types: Cigarettes    Quit date: 02/14/1989  . Smokeless tobacco: Never Used  . Alcohol Use: No  . Drug Use: No  . Sexual Activity: Not Currently   Other Topics Concern  . Not on file   Social History Narrative    Family History  Problem Relation  Age of Onset  . Cancer Mother     breast  . Diabetes Mother   . Cancer Father     Lung  . Diabetes      Outpatient Encounter Prescriptions as of 04/22/2016  Medication Sig  . aspirin EC 325 MG tablet Take 1 tablet (325 mg total) by mouth daily.  . carvedilol (COREG) 6.25 MG tablet TAKE ONE TABLET TWICE DAILY WITH MEALS  . clopidogrel (PLAVIX) 75 MG tablet Take 1 tablet (75 mg total) by mouth daily.  Marland Kitchen levothyroxine (SYNTHROID, LEVOTHROID) 112 MCG tablet Take 1 tablet (112 mcg total) by mouth daily before breakfast.  . lisinopril (PRINIVIL,ZESTRIL) 5 MG tablet Take 1 tablet (5 mg total) by mouth daily. Restart on 02/03/16  . pravastatin (PRAVACHOL) 40 MG tablet    No facility-administered encounter medications on file as of 04/22/2016.        Objective:   Physical Exam  Constitutional: He is oriented to person, place, and time. He appears well-developed and well-nourished.  HENT:  Head: Normocephalic and atraumatic.  Cardiovascular: Normal rate, regular rhythm and normal heart sounds.   Pulmonary/Chest: Effort normal and breath sounds normal.  Neurological: He is alert and oriented to person, place, and time.  Skin: Skin is warm and dry.  Psychiatric: He has a normal mood and affect. His behavior is normal.        Assessment & Plan:  IFG - Well controlled. Looks fantastic. In fact we will just recheck again in a year. Lab Results  Component Value Date   HGBA1C 5.3 04/22/2016   Hypothyroidism-last TSH looks great in November. We'll plan to repeat again in November.  Coronary artery disease-stable. No recent symptoms. Continue aspirin and Plavix and carvedilol and pravastatin. Next  Atrial fibrillation-followed by cardiology has watchman in place.

## 2016-05-15 ENCOUNTER — Telehealth: Payer: Self-pay

## 2016-05-15 ENCOUNTER — Ambulatory Visit: Payer: Medicare Other

## 2016-05-15 DIAGNOSIS — Z95810 Presence of automatic (implantable) cardiac defibrillator: Secondary | ICD-10-CM

## 2016-05-15 DIAGNOSIS — I5022 Chronic systolic (congestive) heart failure: Secondary | ICD-10-CM

## 2016-05-15 NOTE — Progress Notes (Signed)
EPIC Encounter for ICM Monitoring  Patient Name: Steven Everett is a 81 y.o. male Date: 05/15/2016 Primary Care Physican: Beatrice Lecher, MD Primary Cardiologist: Stanford Breed Electrophysiologist: Allred Dry Weight: unknown   In the past month, have you:  1. Gained more than 2 pounds in a day or more than 5 pounds in a week? N/A  2. Had changes in your medications (with verification of current medications)? N/A  3. Had more shortness of breath than is usual for you? N/A  4. Limited your activity because of shortness of breath? N/A  5. Not been able to sleep because of shortness of breath? N/A  6. Had increased swelling in your feet, ankles, legs or stomach area? N/A  7. Had symptoms of dehydration (dizziness, dry mouth, increased thirst, decreased urine output) N/A  8. Had changes in sodium restriction? N/A  9. Been compliant with medication? N/A  ICM trend: 3 month view for 05/15/2016   ICM trend: 1 year view for 05/15/2016  Follow-up plan: ICM clinic phone appointment 06/17/2016.  Attempted call to patient and unable to reach.  Transmission reviewed.  FLUID LEVELS:  Optivol thoracic impedance decreased 04/08/2016 to 04/25/2016 and 05/01/2016 to 05/11/2016 suggesting fluid accumulation with the exception of a few days at baseline and impedance returned to baseline 05/11/2016.    SYMPTOMS:  He denied any fluid symptoms and stated he is doing well.  Does not recall any fluid symptoms during the decreased impedance.         RECOMMENDATIONS:  Advised to check food labels for salt amount per serving.  He stated he does not salt any foods.  Explained there is already a lot of salt in the foods that are purchased and to limit to '2000mg'$  daily.    Rosalene Billings, RN, CCM 05/15/2016 8:50 AM

## 2016-05-15 NOTE — Telephone Encounter (Signed)
Remote ICM transmission received.  Attempted patient call and left message for return call.   

## 2016-05-23 ENCOUNTER — Encounter (HOSPITAL_COMMUNITY): Payer: Self-pay | Admitting: Internal Medicine

## 2016-05-30 ENCOUNTER — Telehealth: Payer: Self-pay

## 2016-05-30 NOTE — Telephone Encounter (Signed)
Returned patient call.  He asked if his home monitor is dependent on a land line.  Advised that his machine will transmit wirelessly and land line is not needed.  He will call if he decides to stop landline service.

## 2016-06-16 ENCOUNTER — Other Ambulatory Visit: Payer: Self-pay | Admitting: Cardiology

## 2016-06-16 NOTE — Telephone Encounter (Signed)
Rx(s) sent to pharmacy electronically.  

## 2016-06-18 ENCOUNTER — Ambulatory Visit (INDEPENDENT_AMBULATORY_CARE_PROVIDER_SITE_OTHER): Payer: Medicare Other

## 2016-06-18 DIAGNOSIS — Z95810 Presence of automatic (implantable) cardiac defibrillator: Secondary | ICD-10-CM | POA: Diagnosis not present

## 2016-06-18 DIAGNOSIS — I5022 Chronic systolic (congestive) heart failure: Secondary | ICD-10-CM | POA: Diagnosis not present

## 2016-06-19 NOTE — Progress Notes (Signed)
EPIC Encounter for ICM Monitoring  Patient Name: Steven Everett is a 81 y.o. male Date: 06/19/2016 Primary Care Physican: Beatrice Lecher, MD Primary Cardiologist: Stanford Breed Electrophysiologist: Allred Dry Weight: unknown      Attempted patient call.  Left detailed message (DPR) that transmission was normal and please call back if he has fluid symptoms, questions or concerns.  Next remote transmission scheduled for 07/22/2016.  Thoracic impedence normal.    ICM trend: 06/19/2016    Follow-up plan: ICM clinic phone appointment on 07/22/2016.  Copy of ICM check sent to device physician.   Rosalene Billings, RN 06/19/2016 12:37 PM

## 2016-07-18 ENCOUNTER — Encounter: Payer: Self-pay | Admitting: Nurse Practitioner

## 2016-07-22 ENCOUNTER — Ambulatory Visit (INDEPENDENT_AMBULATORY_CARE_PROVIDER_SITE_OTHER): Payer: Medicare Other | Admitting: *Deleted

## 2016-07-22 DIAGNOSIS — I5022 Chronic systolic (congestive) heart failure: Secondary | ICD-10-CM

## 2016-07-22 DIAGNOSIS — Z95810 Presence of automatic (implantable) cardiac defibrillator: Secondary | ICD-10-CM

## 2016-07-22 DIAGNOSIS — I420 Dilated cardiomyopathy: Secondary | ICD-10-CM

## 2016-07-22 NOTE — Progress Notes (Signed)
EPIC Encounter for ICM Monitoring  Patient Name: Steven Everett is a 81 y.o. male Date: 07/22/2016 Primary Care Physican: Beatrice Lecher, MD Primary Cardiologist: Stanford Breed Electrophysiologist: Allred Dry Weight: 180 lb        Heart Failure questions reviewed, pt asymptomatic   Thoracic impedance returned normal on 07/19/2016.  He reported drinking more water than usual that correlates with decreased impedance 07/17/2016 to 07/19/2016.  Recommendations: No changes.  Low sodium diet education provided.    Follow-up plan: ICM clinic phone appointment on 10/01/2016.  Office appointment with Chanetta Marshall, NP on 08/28/2016 and Dr Stanford Breed 09/19/2016.  Copy of ICM check sent to device physician.   ICM trend: 07/22/2016   *    Rosalene Billings, RN 07/22/2016 4:51 PM

## 2016-07-22 NOTE — Progress Notes (Signed)
Remote ICD transmission.   

## 2016-07-25 ENCOUNTER — Encounter: Payer: Self-pay | Admitting: Cardiology

## 2016-07-31 LAB — CUP PACEART REMOTE DEVICE CHECK
Battery Remaining Longevity: 99 mo
Brady Statistic AP VP Percent: 0.7 %
Brady Statistic AP VS Percent: 2.47 %
Brady Statistic AS VP Percent: 9.13 %
Brady Statistic RA Percent Paced: 3.17 %
Brady Statistic RV Percent Paced: 9.83 %
Date Time Interrogation Session: 20170822073526
HIGH POWER IMPEDANCE MEASURED VALUE: 71 Ohm
Implantable Lead Implant Date: 20140914
Implantable Lead Model: 5076
Implantable Lead Model: 6935
Lead Channel Impedance Value: 285 Ohm
Lead Channel Impedance Value: 399 Ohm
Lead Channel Pacing Threshold Amplitude: 0.625 V
Lead Channel Sensing Intrinsic Amplitude: 2 mV
Lead Channel Sensing Intrinsic Amplitude: 2 mV
Lead Channel Sensing Intrinsic Amplitude: 4.875 mV
Lead Channel Sensing Intrinsic Amplitude: 4.875 mV
Lead Channel Setting Pacing Amplitude: 2.5 V
Lead Channel Setting Pacing Pulse Width: 0.4 ms
Lead Channel Setting Sensing Sensitivity: 0.3 mV
MDC IDC LEAD IMPLANT DT: 20140914
MDC IDC LEAD LOCATION: 753859
MDC IDC LEAD LOCATION: 753860
MDC IDC MSMT BATTERY VOLTAGE: 2.99 V
MDC IDC MSMT LEADCHNL RA IMPEDANCE VALUE: 456 Ohm
MDC IDC MSMT LEADCHNL RA PACING THRESHOLD AMPLITUDE: 1.125 V
MDC IDC MSMT LEADCHNL RA PACING THRESHOLD PULSEWIDTH: 0.4 ms
MDC IDC MSMT LEADCHNL RV PACING THRESHOLD PULSEWIDTH: 0.4 ms
MDC IDC SET LEADCHNL RA PACING AMPLITUDE: 2 V
MDC IDC STAT BRADY AS VS PERCENT: 87.7 %

## 2016-08-12 DIAGNOSIS — Z23 Encounter for immunization: Secondary | ICD-10-CM | POA: Diagnosis not present

## 2016-08-19 ENCOUNTER — Encounter: Payer: Self-pay | Admitting: Cardiology

## 2016-08-25 ENCOUNTER — Other Ambulatory Visit: Payer: Self-pay | Admitting: Cardiovascular Disease

## 2016-08-27 NOTE — Progress Notes (Signed)
HPI: FU AS. Previously seen for asymptomatic murmur. Transesophageal echocardiogram in March of 2014 showed an ejection fraction of 30-35% and akinesis of the inferior and proximal septal myocardium. There was moderate aortic stenosis (AVA by planimetry 1.2-1.4 cm2) and mild mitral regurgitation. There was mild left atrial enlargement. Cardiac catheterization in April of 2014 showed pulmonary catheter wedge pressure of 13, 30-40% left main, 70 followed by 80% mid LAD, 60-70% proximal right coronary artery and 70% mid. There was moderate LV dysfunction. There was mild aortic stenosis. I reviewed films with Dr Angelena Form and medical therapy felt indicated. Patient had ICD placed in September 2014. Patient seen in January of 2015 and noted to have intermittent atrial fibrillation on his device interrogation. Echocardiogram repeated in February 2017. Ejection fraction 35-40%. Mild aortic stenosis. Mild left atrial enlargement. Had watchman 3/17. TEE April 2017 showed ejection fraction 35-40%, mild aortic insufficiency, reduced cusp excursion aortic valve, biatrial enlargement and enlargement device in place with no thrombus. Since he was last seen, the patient denies any dyspnea on exertion, orthopnea, PND, pedal edema, palpitations, syncope or chest pain.   Current Outpatient Prescriptions  Medication Sig Dispense Refill  . aspirin 81 MG tablet Take 81 mg by mouth 2 (two) times daily. PT TAKE 1 TABLET IN THE MORNING AND 1 TABLET AT NIGHT.    Marland Kitchen carvedilol (COREG) 6.25 MG tablet TAKE ONE TABLET TWICE DAILY WITH MEALS 60 tablet 3  . clopidogrel (PLAVIX) 75 MG tablet Take 1 tablet (75 mg total) by mouth daily. 90 tablet 3  . levothyroxine (SYNTHROID, LEVOTHROID) 112 MCG tablet Take 1 tablet (112 mcg total) by mouth daily before breakfast. 30 tablet 6  . lisinopril (PRINIVIL,ZESTRIL) 5 MG tablet Take 1 tablet (5 mg total) by mouth daily. Restart on 02/03/16 30 tablet 10  . pravastatin (PRAVACHOL) 40 MG  tablet Take 1 tablet (40 mg total) by mouth daily. 30 tablet 10   No current facility-administered medications for this visit.      Past Medical History:  Diagnosis Date  . AICD (automatic cardioverter/defibrillator) present   . Aortic stenosis   . Barrett's esophagus   . Cancer of right lung ([place])    lower lobes  . Chronic systolic dysfunction of left ventricle   . Cirrhosis of liver (HCC)    w/o mention of alcohol  . Diverticulosis of colon   . GERD (gastroesophageal reflux disease)   . Heart murmur   . Hemorrhoids, internal   . Hyperlipidemia   . Hypertension   . Hypothyroidism   . Ischemic cardiomyopathy   . Paroxysmal atrial fibrillation ([place]) 12/14/13   detected on ICD interrogation  . Rheumatic fever   . Seborrheic dermatitis   . Sinus node dysfunction (HCC)   . Type II diabetes mellitus ([place])    "weighed over 300#; lost weight; lost diabetes" (01/31/2016)    Past Surgical History:  Procedure Laterality Date  . ANKLE FRACTURE SURGERY Right 1940s  . APPENDECTOMY  1940s  . BACK SURGERY    . CARDIAC CATHETERIZATION  "years ago"  . CARDIAC DEFIBRILLATOR PLACEMENT  08/12/13   Medtronic Evera XT DR implanted by [name] for primary prevention of sudden death  . CARDIAC PACEMAKER PLACEMENT    . CATARACT EXTRACTION, BILATERAL Bilateral 8-11   Dr Ayesha Rumpf  . COLONOSCOPY    . FRACTURE SURGERY    . IMPLANTABLE CARDIOVERTER DEFIBRILLATOR IMPLANT N/A 08/12/2013   Procedure: IMPLANTABLE CARDIOVERTER DEFIBRILLATOR IMPLANT;  Surgeon: Coralyn Mark, MD;  Location:  [place] CATH LAB;  Service: Cardiovascular;  Laterality: N/A;  . JOINT REPLACEMENT    . LEFT ATRIAL APPENDAGE OCCLUSION N/A 01/31/2016   Procedure: LEFT ATRIAL APPENDAGE OCCLUSION;  Surgeon: Thompson Grayer, MD;  Location: [place] CV LAB;  Service: Cardiovascular;  Laterality: N/A;  . LUMBAR DISC SURGERY  X 2   "herniated discs"  . LUNG REMOVAL, PARTIAL Right 1998   for lung cancer by Dr Rollene Fare; middle and lower lobes  .  TEE WITHOUT CARDIOVERSION N/A 02/14/2013   Procedure: TRANSESOPHAGEAL ECHOCARDIOGRAM (TEE);  Surgeon: Lelon Perla, MD;  Location: [facility] ENDOSCOPY;  Service: Cardiovascular;  Laterality: N/A;  . TEE WITHOUT CARDIOVERSION N/A 01/09/2016   Procedure: TRANSESOPHAGEAL ECHOCARDIOGRAM (TEE);  Surgeon: Josue Hector, MD;  Location: [facility] ENDOSCOPY;  Service: Cardiovascular;  Laterality: N/A;  . TEE WITHOUT CARDIOVERSION N/A 03/20/2016   Procedure: TRANSESOPHAGEAL ECHOCARDIOGRAM (TEE);  Surgeon: Lelon Perla, MD;  Location: [facility] ENDOSCOPY;  Service: Cardiovascular;  Laterality: N/A;  . TOTAL HIP ARTHROPLASTY Right    Dr Alvan Dame    Social History   Social History  . Marital status: Widowed    Spouse name: Hoyle Sauer  . Number of children: 1  . Years of education: N/A   Occupational History  .  Retired   Social History Main Topics  . Smoking status: Former Smoker    Packs/day: 2.00    Years: 36.00    Types: Cigarettes    Quit date: 02/14/1989  . Smokeless tobacco: Never Used  . Alcohol use No  . Drug use: No  . Sexual activity: Not Currently   Other Topics Concern  . Not on file   Social History Narrative  . No narrative on file    Family History  Problem Relation Age of Onset  . Cancer Mother     breast  . Diabetes Mother   . Cancer Father     Lung  . Diabetes      ROS: no fevers or chills, productive cough, hemoptysis, dysphasia, odynophagia, melena, hematochezia, dysuria, hematuria, rash, seizure activity, orthopnea, PND, pedal edema, claudication. Remaining systems are negative.  Physical Exam: Well-developed well-nourished in no acute distress.  Skin is warm and dry.  HEENT is normal.  Neck is supple.  Chest is clear to auscultation with normal expansion.  Cardiovascular exam is regular rate and rhythm. 2/6 systolic murmur left sternal border. S2 is not diminished. Abdominal exam nontender or distended. No masses palpated. Extremities show no edema. neuro grossly  intact  ECG-Sinus rhythm with first-degree AV block, left anterior fascicular block, right bundle branch block.  A/P  1 Aortic stenosis-plan follow-up echocardiogram February 2018.  2 atrial fibrillation-patient is in sinus rhythm today. He is status post watchman device. Continue ASA and plavix. DC plavix six months after watchman. Continue beta blocker.  3 coronary artery disease-continue statin and aspirin. He did not tolerate Lipitor previously.  4 hyperlipidemia-continue statin.  5 cardiomyopathy-continue ACE inhibitor and beta blocker.  6 ICD-management per electrophysiology.  Kirk Ruths, MD

## 2016-08-28 ENCOUNTER — Encounter: Payer: Medicare Other | Admitting: Nurse Practitioner

## 2016-09-01 ENCOUNTER — Ambulatory Visit (INDEPENDENT_AMBULATORY_CARE_PROVIDER_SITE_OTHER): Payer: Medicare Other | Admitting: Cardiology

## 2016-09-01 ENCOUNTER — Encounter: Payer: Self-pay | Admitting: Cardiology

## 2016-09-01 VITALS — BP 118/70 | HR 91 | Ht 70.0 in | Wt 183.0 lb

## 2016-09-01 DIAGNOSIS — I480 Paroxysmal atrial fibrillation: Secondary | ICD-10-CM

## 2016-09-01 DIAGNOSIS — I359 Nonrheumatic aortic valve disorder, unspecified: Secondary | ICD-10-CM

## 2016-09-01 DIAGNOSIS — I2583 Coronary atherosclerosis due to lipid rich plaque: Secondary | ICD-10-CM | POA: Diagnosis not present

## 2016-09-01 DIAGNOSIS — E784 Other hyperlipidemia: Secondary | ICD-10-CM | POA: Diagnosis not present

## 2016-09-01 DIAGNOSIS — I2510 Atherosclerotic heart disease of native coronary artery without angina pectoris: Secondary | ICD-10-CM | POA: Diagnosis not present

## 2016-09-01 DIAGNOSIS — E7849 Other hyperlipidemia: Secondary | ICD-10-CM

## 2016-09-01 NOTE — Patient Instructions (Signed)
Medication Instructions:   NO CHANGE  Testing/Procedures:  Your physician has requested that you have an echocardiogram. Echocardiography is a painless test that uses sound waves to create images of your heart. It provides your doctor with information about the size and shape of your heart and how well your heart's chambers and valves are working. This procedure takes approximately one hour. There are no restrictions for this procedure.SCHEDULE IN [place]    Follow-Up:  Your physician wants you to follow-up in: [place] will receive a reminder letter in the mail two months in advance. If you don't receive a letter, please call our office to schedule the follow-up appointment.   If you need a refill on your cardiac medications before your next appointment, please call your pharmacy.

## 2016-09-10 NOTE — Progress Notes (Signed)
Electrophysiology Office Note Date: 09/11/2016  ID:  Steven, Everett [birth year], MRN [id number]  PCP: Beatrice Lecher, MD Primary Cardiologist: Stanford Breed Electrophysiologist: Allred  CC: post Watchman follow up  Steven Everett is a 81 y.o. male seen today 6 months post Watchman implant. Since last being seen in clinic, the patient reports doing very well.  He is tolerating Plavix and ASA without bleeding complications.  He denies chest pain, palpitations, dyspnea, PND, orthopnea, nausea, vomiting, dizziness, syncope, edema, weight gain, or early satiety.  He has not had ICD shocks.   Device History: MDT dual chamber ICD implanted 2014 for primary prevention History of appropriate therapy: no History of AAD therapy: no   Past Medical History:  Diagnosis Date  . Aortic stenosis   . Barrett's esophagus   . Cancer of right lung ([place])    lower lobes  . Chronic systolic dysfunction of left ventricle   . Cirrhosis of liver (HCC)    w/o mention of alcohol  . Diverticulosis of colon   . GERD (gastroesophageal reflux disease)   . Hemorrhoids, internal   . Hyperlipidemia   . Hypertension   . Hypothyroidism   . Ischemic cardiomyopathy   . Paroxysmal atrial fibrillation ([place]) 12/14/2013   a. s/p Watchman   . Rheumatic fever   . Seborrheic dermatitis   . Sinus node dysfunction (HCC)   . Type II diabetes mellitus ([place])    "weighed over 300#; lost weight; lost diabetes" (01/31/2016)   Past Surgical History:  Procedure Laterality Date  . ANKLE FRACTURE SURGERY Right 1940s  . APPENDECTOMY  1940s  . CARDIAC CATHETERIZATION  "years ago"  . CARDIAC DEFIBRILLATOR PLACEMENT  08/12/13   Medtronic Evera XT DR implanted by [name] for primary prevention of sudden death  . CARDIAC PACEMAKER PLACEMENT    . CATARACT EXTRACTION, BILATERAL Bilateral 8-11   Dr Ayesha Rumpf  . COLONOSCOPY    . FRACTURE SURGERY    . IMPLANTABLE CARDIOVERTER DEFIBRILLATOR IMPLANT N/A 08/12/2013   Procedure:  IMPLANTABLE CARDIOVERTER DEFIBRILLATOR IMPLANT;  Surgeon: Coralyn Mark, MD;  Location: [place] CATH LAB;  Service: Cardiovascular;  Laterality: N/A;  . JOINT REPLACEMENT    . LEFT ATRIAL APPENDAGE OCCLUSION N/A 01/31/2016   Procedure: LEFT ATRIAL APPENDAGE OCCLUSION;  Surgeon: Thompson Grayer, MD;  Location: [place] CV LAB;  Service: Cardiovascular;  Laterality: N/A;  . LUMBAR DISC SURGERY  X 2   "herniated discs"  . LUNG REMOVAL, PARTIAL Right 1998   for lung cancer by Dr Rollene Fare; middle and lower lobes  . TEE WITHOUT CARDIOVERSION N/A 02/14/2013   Procedure: TRANSESOPHAGEAL ECHOCARDIOGRAM (TEE);  Surgeon: Lelon Perla, MD;  Location: [place] ENDOSCOPY;  Service: Cardiovascular;  Laterality: N/A;  . TEE WITHOUT CARDIOVERSION N/A 01/09/2016   Procedure: TRANSESOPHAGEAL ECHOCARDIOGRAM (TEE);  Surgeon: Josue Hector, MD;  Location: [facility] ENDOSCOPY;  Service: Cardiovascular;  Laterality: N/A;  . TEE WITHOUT CARDIOVERSION N/A 03/20/2016   Procedure: TRANSESOPHAGEAL ECHOCARDIOGRAM (TEE);  Surgeon: Lelon Perla, MD;  Location: [facility] ENDOSCOPY;  Service: Cardiovascular;  Laterality: N/A;  . TOTAL HIP ARTHROPLASTY Right    Dr Alvan Dame    Current Outpatient Prescriptions  Medication Sig Dispense Refill  . aspirin 81 MG tablet TAKE 1 TABLET  BY MOUTH IN THE MORNING AND 1 TABLET BY MOUTH AT NIGHT.    Marland Kitchen carvedilol (COREG) 6.25 MG tablet Take 6.25 mg by mouth 2 (two) times daily with a meal.    . levothyroxine (SYNTHROID, LEVOTHROID) 112 MCG tablet Take 1  tablet (112 mcg total) by mouth daily before breakfast. 30 tablet 6  . lisinopril (PRINIVIL,ZESTRIL) 5 MG tablet Take 1 tablet (5 mg total) by mouth daily. Restart on 02/03/16 30 tablet 10  . pravastatin (PRAVACHOL) 40 MG tablet Take 1 tablet (40 mg total) by mouth daily. 30 tablet 10   No current facility-administered medications for this visit.     Allergies:   Atorvastatin   Social History: Social History   Social History  . Marital status: Widowed     Spouse name: Hoyle Sauer  . Number of children: 1  . Years of education: N/A   Occupational History  .  Retired   Social History Main Topics  . Smoking status: Former Smoker    Packs/day: 2.00    Years: 36.00    Types: Cigarettes    Quit date: 02/14/1989  . Smokeless tobacco: Never Used  . Alcohol use No  . Drug use: No  . Sexual activity: Not Currently   Other Topics Concern  . Not on file   Social History Narrative  . No narrative on file    Family History: Family History  Problem Relation Age of Onset  . Cancer Mother     breast  . Diabetes Mother   . Cancer Father     Lung  . Diabetes      Review of Systems: All other systems reviewed and are otherwise negative except as noted above.   Physical Exam: VS:  BP 118/62   Pulse 75   Ht '5\' 10"'$  (1.778 m)   Wt 185 lb (83.9 kg)   SpO2 99%   BMI 26.54 kg/m  , BMI Body mass index is 26.54 kg/m.  GEN- The patient is elderly appearing, alert and oriented x 3 today.   HEENT: normocephalic, atraumatic; sclera clear, conjunctiva pink; hearing intact; oropharynx clear; neck supple Lungs- Clear to ausculation bilaterally, normal work of breathing.  Scattered expiratory wheezing Heart- Regular rate and rhythm, 2/6 SEM GI- soft, non-tender, non-distended, bowel sounds present Extremities- no clubbing, cyanosis, or edema  MS- no significant deformity or atrophy Skin- warm and dry, no rash or lesion; ICD pocket well healed, scattered ecchymosis  Psych- euthymic mood, full affect Neuro- strength and sensation are intact  ICD interrogation- reviewed in detail today,  See PACEART report  EKG:  EKG is not ordered today.  Recent Labs: 10/18/2015: TSH 2.128 10/21/2015: ALT 26 01/30/2016: Hemoglobin 15.7; Platelets 138 02/11/2016: BUN 15; Creat 0.81; Potassium 4.2; Sodium 138   Wt Readings from Last 3 Encounters:  09/11/16 185 lb (83.9 kg)  09/01/16 183 lb (83 kg)  04/22/16 184 lb (83.5 kg)     Other studies  Reviewed: Additional studies/ records that were reviewed today include: hospital records  Assessment and Plan:  1.  Paroxysmal atrial fibrillation Doing well s/p Watchman implant Discontinue Plavix now that 6 months post Watchman Continue lifelong ASA Burden by device interrogation 13.5%, v rates controlled   2. Chronic systolic dysfunction euvolemic today Stable on an appropriate medical regimen Normal ICD function See Pace Art report No changes today  3.  HTN Stable No change required today  Current medicines are reviewed at length with the patient today.   The patient does not have concerns regarding his medicines.  The following changes were made today:  none  Labs/ tests ordered today include: none   Disposition:   Follow up with [facility], ICM clinic, Dr Stanford Breed as scheduled, [name] 1 year    Signed, [employer]  Lynnell Jude, NP  09/11/2016 9:01 AM  [facility] [address] [place] [address] [phone] (office) [phone] (fax)

## 2016-09-11 ENCOUNTER — Encounter: Payer: Self-pay | Admitting: Internal Medicine

## 2016-09-11 ENCOUNTER — Ambulatory Visit (INDEPENDENT_AMBULATORY_CARE_PROVIDER_SITE_OTHER): Payer: Medicare Other | Admitting: Nurse Practitioner

## 2016-09-11 ENCOUNTER — Encounter: Payer: Self-pay | Admitting: Nurse Practitioner

## 2016-09-11 VITALS — BP 118/62 | HR 75 | Ht 70.0 in | Wt 185.0 lb

## 2016-09-11 DIAGNOSIS — I5022 Chronic systolic (congestive) heart failure: Secondary | ICD-10-CM

## 2016-09-11 DIAGNOSIS — I480 Paroxysmal atrial fibrillation: Secondary | ICD-10-CM

## 2016-09-11 DIAGNOSIS — I2583 Coronary atherosclerosis due to lipid rich plaque: Secondary | ICD-10-CM

## 2016-09-11 DIAGNOSIS — I10 Essential (primary) hypertension: Secondary | ICD-10-CM

## 2016-09-11 DIAGNOSIS — I2510 Atherosclerotic heart disease of native coronary artery without angina pectoris: Secondary | ICD-10-CM

## 2016-09-11 NOTE — Patient Instructions (Addendum)
Medication Instructions:   STOP TAKING PLAVIX 75 MG  If you need a refill on your cardiac medications before your next appointment, please call your pharmacy.  Labwork: NONE ORDER TODAY    Testing/Procedures: NONE ORDER TODAY    Follow-Up:  Your physician wants you to follow-up in: [place] will receive a reminder letter in the mail two months in advance. If you don't receive a letter, please call our office to schedule the follow-up appointment.  Remote monitoring is used to monitor your Pacemaker of ICD from home. This monitoring reduces the number of office visits required to check your device to one time per year. It allows [place] to keep an eye on the functioning of your device to ensure it is working properly. You are scheduled for a device check from home on ..12/12/16..You may send your transmission at any time that day. If you have a wireless device, the transmission will be sent automatically. After your physician reviews your transmission, you will receive a postcard with your next transmission date. ]   Any Other Special Instructions Will Be Listed Below (If Applicable).                                                                                                                                                 \

## 2016-09-30 DIAGNOSIS — M25571 Pain in right ankle and joints of right foot: Secondary | ICD-10-CM | POA: Diagnosis not present

## 2016-09-30 DIAGNOSIS — M792 Neuralgia and neuritis, unspecified: Secondary | ICD-10-CM | POA: Diagnosis not present

## 2016-09-30 DIAGNOSIS — M19071 Primary osteoarthritis, right ankle and foot: Secondary | ICD-10-CM | POA: Diagnosis not present

## 2016-10-01 ENCOUNTER — Ambulatory Visit (INDEPENDENT_AMBULATORY_CARE_PROVIDER_SITE_OTHER): Payer: Medicare Other

## 2016-10-01 DIAGNOSIS — I5022 Chronic systolic (congestive) heart failure: Secondary | ICD-10-CM

## 2016-10-01 DIAGNOSIS — Z95810 Presence of automatic (implantable) cardiac defibrillator: Secondary | ICD-10-CM

## 2016-10-01 NOTE — Progress Notes (Signed)
EPIC Encounter for ICM Monitoring  Patient Name: Steven Everett is a 81 y.o. male Date: 10/01/2016 Primary Care Physican: Beatrice Lecher, MD Primary Cardiologist:Crenshaw Electrophysiologist: Allred Dry Weight:    180 lb       Heart Failure questions reviewed, pt asymptomatic   Thoracic impedance abnormal suggesting fluid accumulation.  Recommendations:  No changes.  Advised to limit salt intake to 2000 mg daily.  Encouraged to call for fluid symptoms.    Follow-up plan: ICM clinic phone appointment on 10/16/2016 to recheck fluid levels.   Patient currently not on diuretic.   Copy of ICM check sent to primary cardiologist and device physician.   ICM trend: 10/01/2016       Steven Billings, RN 10/01/2016 1:26 PM

## 2016-10-07 DIAGNOSIS — M19071 Primary osteoarthritis, right ankle and foot: Secondary | ICD-10-CM | POA: Diagnosis not present

## 2016-10-07 DIAGNOSIS — M25571 Pain in right ankle and joints of right foot: Secondary | ICD-10-CM | POA: Diagnosis not present

## 2016-10-07 DIAGNOSIS — M792 Neuralgia and neuritis, unspecified: Secondary | ICD-10-CM | POA: Diagnosis not present

## 2016-10-16 ENCOUNTER — Ambulatory Visit: Payer: Medicare Other | Admitting: Family Medicine

## 2016-10-16 ENCOUNTER — Ambulatory Visit (INDEPENDENT_AMBULATORY_CARE_PROVIDER_SITE_OTHER): Payer: Medicare Other

## 2016-10-16 DIAGNOSIS — I5022 Chronic systolic (congestive) heart failure: Secondary | ICD-10-CM

## 2016-10-16 DIAGNOSIS — Z95810 Presence of automatic (implantable) cardiac defibrillator: Secondary | ICD-10-CM

## 2016-10-17 NOTE — Progress Notes (Signed)
EPIC Encounter for ICM Monitoring  Patient Name: Steven Everett is a 81 y.o. male Date: 10/17/2016 Primary Care Physican: Beatrice Lecher, MD Primary Cardiologist:Crenshaw Electrophysiologist: Allred Dry Weight:unknown          Attempted ICM call and unable to reach. Left detailed message regarding transmission.  Transmission reviewed.   Thoracic impedance returned to normal   Follow-up plan: ICM clinic phone appointment on 11/13/2016.  Copy of ICM check sent to primary cardiologist and device physician.   ICM trend: 10/16/2016       Rosalene Billings, RN 10/17/2016 3:22 PM

## 2016-10-21 ENCOUNTER — Encounter: Payer: Self-pay | Admitting: Family Medicine

## 2016-10-21 ENCOUNTER — Ambulatory Visit (INDEPENDENT_AMBULATORY_CARE_PROVIDER_SITE_OTHER): Payer: Medicare Other | Admitting: Family Medicine

## 2016-10-21 VITALS — BP 128/51 | HR 79 | Ht 70.0 in | Wt 185.0 lb

## 2016-10-21 DIAGNOSIS — K7460 Unspecified cirrhosis of liver: Secondary | ICD-10-CM

## 2016-10-21 DIAGNOSIS — I2583 Coronary atherosclerosis due to lipid rich plaque: Secondary | ICD-10-CM

## 2016-10-21 DIAGNOSIS — N183 Chronic kidney disease, stage 3 unspecified: Secondary | ICD-10-CM

## 2016-10-21 DIAGNOSIS — R7301 Impaired fasting glucose: Secondary | ICD-10-CM

## 2016-10-21 DIAGNOSIS — E039 Hypothyroidism, unspecified: Secondary | ICD-10-CM

## 2016-10-21 DIAGNOSIS — I2510 Atherosclerotic heart disease of native coronary artery without angina pectoris: Secondary | ICD-10-CM

## 2016-10-21 LAB — COMPLETE METABOLIC PANEL WITH GFR
ALBUMIN: 4 g/dL (ref 3.6–5.1)
ALT: 23 U/L (ref 9–46)
AST: 26 U/L (ref 10–35)
Alkaline Phosphatase: 80 U/L (ref 40–115)
BILIRUBIN TOTAL: 0.7 mg/dL (ref 0.2–1.2)
BUN: 19 mg/dL (ref 7–25)
CALCIUM: 9.2 mg/dL (ref 8.6–10.3)
CHLORIDE: 103 mmol/L (ref 98–110)
CO2: 27 mmol/L (ref 20–31)
CREATININE: 1.18 mg/dL (ref 0.70–1.18)
GFR, EST AFRICAN AMERICAN: 67 mL/min (ref >=60)
GFR, Est Non African American: 58 mL/min — ABNORMAL LOW (ref >=60)
Glucose, Bld: 91 mg/dL (ref 65–99)
Potassium: 4.3 mmol/L (ref 3.5–5.3)
Sodium: 139 mmol/L (ref 135–146)
Total Protein: 6.8 g/dL (ref 6.1–8.1)

## 2016-10-21 LAB — LIPID PANEL
CHOLESTEROL: 114 mg/dL (ref <200)
HDL: 49 mg/dL (ref >40)
LDL Cholesterol: 53 mg/dL (ref <100)
TRIGLYCERIDES: 58 mg/dL (ref <150)
Total CHOL/HDL Ratio: 2.3 Ratio (ref <5.0)
VLDL: 12 mg/dL (ref <30)

## 2016-10-21 LAB — CBC WITH DIFFERENTIAL/PLATELET
BASOS ABS: 61 {cells}/uL (ref 0–200)
BASOS PCT: 1 %
EOS ABS: 244 {cells}/uL (ref 15–500)
Eosinophils Relative: 4 %
HEMATOCRIT: 47.6 % (ref 38.5–50.0)
HEMOGLOBIN: 15.2 g/dL (ref 13.2–17.1)
LYMPHS ABS: 1159 {cells}/uL (ref 850–3900)
Lymphocytes Relative: 19 %
MCH: 27.7 pg (ref 27.0–33.0)
MCHC: 31.9 g/dL — ABNORMAL LOW (ref 32.0–36.0)
MCV: 86.9 fL (ref 80.0–100.0)
MONO ABS: 427 {cells}/uL (ref 200–950)
MONOS PCT: 7 %
MPV: 10.4 fL (ref 7.5–12.5)
NEUTROS ABS: 4209 {cells}/uL (ref 1500–7800)
Neutrophils Relative %: 69 %
PLATELETS: 146 10*3/uL (ref 140–400)
RBC: 5.48 MIL/uL (ref 4.20–5.80)
RDW: 13.7 % (ref 11.0–15.0)
WBC: 6.1 10*3/uL (ref 3.8–10.8)

## 2016-10-21 LAB — POCT GLYCOSYLATED HEMOGLOBIN (HGB A1C): HEMOGLOBIN A1C: 5.6

## 2016-10-21 LAB — TSH: TSH: 1.38 mIU/L (ref 0.40–4.50)

## 2016-10-21 NOTE — Progress Notes (Addendum)
Subjective:    CC: Hypothyroid  HPI:  Hypothyroidism-no recent skin or hair changes. No significant weight changes. Early on levothyroxin 112 g daily and taking as prescribed.  IFG - no increased thirst or urination.  Cirrhosis of the liver, nonalcoholic-due to recheck liver enzymes.    Past medical history, Surgical history, Family history not pertinant except as noted below, Social history, Allergies, and medications have been entered into the medical record, reviewed, and corrections made.   Review of Systems: No fevers, chills, night sweats, weight loss, chest pain, or shortness of breath.   Objective:    General: Well Developed, well nourished, and in no acute distress.  Neuro: Alert and oriented x3, extra-ocular muscles intact, sensation grossly intact.  HEENT: Normocephalic, atraumatic, no thyromegaly Skin: Warm and dry, no rashes. Cardiac: Regular rate and rhythm, 3/6 SEM best heard ar the right sternal border.  No lower extremity edema.  Respiratory: Clear to auscultation bilaterally. Not using accessory muscles, speaking in full sentences.   Impression and Recommendations:    Hypothyroidism-due to recheck TSH. Will call with results once available. He is asymptomatic though.  Impaired fasting glucose-hemoglobin A1c looks absolutely fantastic at 5.6. We'll continue to monitor every 6-12 months. He does a great job in controlling his diet.  Cirrhosis of the liver-due to recheck liver enzymes. Follows with Dr. Ardis Hughs, though he says he has not seen him in years. Currently on pravastatin. Recheck platelets.    CAD- due to check lipids. On statin.      CKD 3 - due to recheck renal function.

## 2016-10-27 ENCOUNTER — Other Ambulatory Visit: Payer: Self-pay | Admitting: Family Medicine

## 2016-11-04 DIAGNOSIS — G5761 Lesion of plantar nerve, right lower limb: Secondary | ICD-10-CM | POA: Diagnosis not present

## 2016-11-04 DIAGNOSIS — M792 Neuralgia and neuritis, unspecified: Secondary | ICD-10-CM | POA: Diagnosis not present

## 2016-11-04 DIAGNOSIS — M19071 Primary osteoarthritis, right ankle and foot: Secondary | ICD-10-CM | POA: Diagnosis not present

## 2016-11-04 DIAGNOSIS — M25571 Pain in right ankle and joints of right foot: Secondary | ICD-10-CM | POA: Diagnosis not present

## 2016-11-07 DIAGNOSIS — N401 Enlarged prostate with lower urinary tract symptoms: Secondary | ICD-10-CM | POA: Diagnosis not present

## 2016-11-07 DIAGNOSIS — R3912 Poor urinary stream: Secondary | ICD-10-CM | POA: Diagnosis not present

## 2016-11-13 ENCOUNTER — Ambulatory Visit (INDEPENDENT_AMBULATORY_CARE_PROVIDER_SITE_OTHER): Payer: Medicare Other

## 2016-11-13 DIAGNOSIS — Z95810 Presence of automatic (implantable) cardiac defibrillator: Secondary | ICD-10-CM

## 2016-11-13 DIAGNOSIS — I5022 Chronic systolic (congestive) heart failure: Secondary | ICD-10-CM

## 2016-11-14 NOTE — Progress Notes (Signed)
EPIC Encounter for ICM Monitoring  Patient Name: Steven Everett is a 81 y.o. male Date: 11/14/2016 Primary Care Physican: Beatrice Lecher, MD Primary Cardiologist:Crenshaw Electrophysiologist: Allred Dry Weight:unknown   Heart Failure questions reviewed, pt asymptomatic.  He stated he is feeling very good.   Thoracic impedance is just below baseline.  Not currently prescribed diuretic.  Recommendations: No changes.  Reinforced to limit low salt food choices to 2000 mg day and limiting fluid intake to < 2 liters per day. Encouraged to call for fluid symptoms.    Follow-up plan: ICM clinic phone appointment on 12/15/2016.  Copy of ICM check sent to device physician.   ICM trend: 11/14/2016       Rosalene Billings, RN 11/14/2016 11:45 AM

## 2016-12-15 ENCOUNTER — Ambulatory Visit (INDEPENDENT_AMBULATORY_CARE_PROVIDER_SITE_OTHER): Payer: Medicare Other | Admitting: *Deleted

## 2016-12-15 DIAGNOSIS — Z95810 Presence of automatic (implantable) cardiac defibrillator: Secondary | ICD-10-CM

## 2016-12-15 DIAGNOSIS — I5022 Chronic systolic (congestive) heart failure: Secondary | ICD-10-CM | POA: Diagnosis not present

## 2016-12-15 DIAGNOSIS — I420 Dilated cardiomyopathy: Secondary | ICD-10-CM

## 2016-12-15 NOTE — Progress Notes (Signed)
Remote ICD transmission.   

## 2016-12-15 NOTE — Progress Notes (Signed)
EPIC Encounter for ICM Monitoring  Patient Name: Steven Everett is a 82 y.o. male Date: 12/15/2016 Primary Care Physican: Beatrice Lecher, MD Primary Cardiologist:Crenshaw Electrophysiologist: Allred Dry Weight:unknown  Clinical Status (13-Nov-2016 to 15-Dec-2016) Treated VT/VF 0 episodes AT/AF 201 episodes  Time in AT/AF 1.2 hr/day (4.9%)   Observations (1) (13-Nov-2016 to 15-Dec-2016)  AT/AF >= 6 hr for 1 days.      Heart Failure questions reviewed, pt asymptomatic   Thoracic impedance at normal today but has had a little fluid since 12/07/2016.   Does not currently take diuretic.   Recommendations: No changes. Discussed limiting dietary salt intake to 2000 mg/day and fluid intake to < 2 liters per day. Encouraged to call for fluid symptoms.  Follow-up plan: ICM clinic phone appointment on 01/15/2017.  Copy of ICM check sent to device physician.   3 month ICM trend: 12/15/2016        OptiVol 2.0 Fluid Trends (Nov-2016 to Jan-2018)      Rosalene Billings, RN 12/15/2016 9:32 AM

## 2016-12-19 LAB — CUP PACEART REMOTE DEVICE CHECK
Battery Remaining Longevity: 91 mo
Brady Statistic AP VP Percent: 0.26 %
Brady Statistic AP VS Percent: 1.72 %
Brady Statistic RV Percent Paced: 12.87 %
Date Time Interrogation Session: 20180115062407
HIGH POWER IMPEDANCE MEASURED VALUE: 68 Ohm
Implantable Lead Implant Date: 20140914
Implantable Lead Location: 753859
Implantable Lead Model: 5076
Implantable Pulse Generator Implant Date: 20140912
Lead Channel Impedance Value: 285 Ohm
Lead Channel Impedance Value: 437 Ohm
Lead Channel Pacing Threshold Amplitude: 0.75 V
Lead Channel Pacing Threshold Amplitude: 1 V
Lead Channel Pacing Threshold Pulse Width: 0.4 ms
Lead Channel Sensing Intrinsic Amplitude: 5.125 mV
Lead Channel Sensing Intrinsic Amplitude: 5.125 mV
Lead Channel Setting Pacing Amplitude: 2 V
Lead Channel Setting Pacing Amplitude: 2.5 V
Lead Channel Setting Pacing Pulse Width: 0.4 ms
Lead Channel Setting Sensing Sensitivity: 0.3 mV
MDC IDC LEAD IMPLANT DT: 20140914
MDC IDC LEAD LOCATION: 753860
MDC IDC MSMT BATTERY VOLTAGE: 3 V
MDC IDC MSMT LEADCHNL RA PACING THRESHOLD PULSEWIDTH: 0.4 ms
MDC IDC MSMT LEADCHNL RA SENSING INTR AMPL: 2.125 mV
MDC IDC MSMT LEADCHNL RA SENSING INTR AMPL: 2.125 mV
MDC IDC MSMT LEADCHNL RV IMPEDANCE VALUE: 437 Ohm
MDC IDC STAT BRADY AS VP PERCENT: 12.6 %
MDC IDC STAT BRADY AS VS PERCENT: 85.42 %
MDC IDC STAT BRADY RA PERCENT PACED: 1.93 %

## 2016-12-24 ENCOUNTER — Encounter: Payer: Self-pay | Admitting: Cardiology

## 2016-12-26 ENCOUNTER — Other Ambulatory Visit: Payer: Self-pay | Admitting: Internal Medicine

## 2016-12-29 ENCOUNTER — Ambulatory Visit (INDEPENDENT_AMBULATORY_CARE_PROVIDER_SITE_OTHER): Payer: Medicare Other

## 2016-12-29 ENCOUNTER — Other Ambulatory Visit: Payer: Self-pay | Admitting: Cardiology

## 2016-12-29 ENCOUNTER — Telehealth: Payer: Self-pay

## 2016-12-29 DIAGNOSIS — Z95810 Presence of automatic (implantable) cardiac defibrillator: Secondary | ICD-10-CM

## 2016-12-29 DIAGNOSIS — I5022 Chronic systolic (congestive) heart failure: Secondary | ICD-10-CM

## 2016-12-29 NOTE — Telephone Encounter (Signed)
Received call from patient.  He reported gaining 8 lbs in last week, feet are swollen and has some shortness of breath.  He will send remote transmission for review of fluid level.  See ICM note.

## 2016-12-29 NOTE — Progress Notes (Signed)
EPIC Encounter for ICM Monitoring  Patient Name: Steven Everett is a 82 y.o. male Date: 12/29/2016 Primary Care Physican: Beatrice Lecher, MD Primary Cardiologist:Crenshaw Electrophysiologist: Allred Dry Weight:unknown Time in AT/AF 2.5 hr day (10.3%) Longest ATF 40 minutes  Clinical Status (15-Dec-2016 to 29-Dec-2016) Treated VT/VF 0 episodes  AT/AF 180 episodes  Time in AT/AF 2.5 hr/day (10.3%)       Heart Failure questions reviewed, pt has gained 8 lbs in last week and swelling of ankles.  Thoracic impedance abnormal suggesting fluid accumulation since 12/08/2016.  Labs: 10/21/2016 Creatinine 1.18, BUN 19, Potassium 4.3, Sodium 139  Recommendations:  Spoke with [name] and recommended to make an appointment with [facility] extender or Dr Stanford Breed as soon as something is available.  Patient currently not on diuretic.  Follow-up plan: ICM clinic phone appointment on 01/06/2017 to recheck fluid levels.  Copy of ICM check sent to primary cardiologist and device physician.   3 month ICM trend: 12/29/2016     1 Year ICM trend:      Rosalene Billings, RN 12/29/2016 2:18 PM

## 2016-12-30 NOTE — Progress Notes (Signed)
Patient called and decided he as feeling better today.  He thinks weight has dropped and no ankle swelling today.  He does not want an office appointment at this time.  Advised if symptoms return he can call [facility] office to schedule with PA if needed.  No changes today.  Recheck fluid levels on 01/06/2017.

## 2017-01-01 DIAGNOSIS — R3912 Poor urinary stream: Secondary | ICD-10-CM | POA: Diagnosis not present

## 2017-01-01 DIAGNOSIS — N401 Enlarged prostate with lower urinary tract symptoms: Secondary | ICD-10-CM | POA: Diagnosis not present

## 2017-01-02 ENCOUNTER — Telehealth (HOSPITAL_COMMUNITY): Payer: Self-pay | Admitting: Radiology

## 2017-01-02 NOTE — Telephone Encounter (Signed)
LMOM Dr. Stanford Breed want echocardiogram scheduled for this month

## 2017-01-06 ENCOUNTER — Ambulatory Visit (INDEPENDENT_AMBULATORY_CARE_PROVIDER_SITE_OTHER): Payer: Medicare Other

## 2017-01-06 DIAGNOSIS — I5022 Chronic systolic (congestive) heart failure: Secondary | ICD-10-CM

## 2017-01-06 DIAGNOSIS — Z95810 Presence of automatic (implantable) cardiac defibrillator: Secondary | ICD-10-CM

## 2017-01-06 NOTE — Progress Notes (Signed)
EPIC Encounter for ICM Monitoring  Patient Name: Steven Everett is a 82 y.o. male Date: 01/06/2017 Primary Care Physican: Beatrice Lecher, MD Primary Cardiologist:Crenshaw Electrophysiologist: Allred Dry Weight:unknown      Heart Failure questions reviewed, pt asymptomatic.  He stated symptoms have resolved since 1/29 and is feeling fine now  Thoracic impedance close to normal.  Labs: 10/21/2016 Creatinine 1.18, BUN 19, Potassium 4.3, Sodium 139  Recommendations: No changes. Reminded to limit dietary salt intake to 2000 mg/day and fluid intake to < 2 liters/day. Encouraged to call for fluid symptoms.  Follow-up plan: ICM clinic phone appointment on 02/06/2017.  Copy of ICM check sent to device physician.   3 month ICM trend: 01/06/2017           1 Year ICM trend:      Rosalene Billings, RN 01/06/2017 4:44 PM

## 2017-01-09 ENCOUNTER — Encounter: Payer: Self-pay | Admitting: Cardiology

## 2017-01-09 ENCOUNTER — Ambulatory Visit: Payer: Medicare Other | Admitting: Cardiovascular Disease

## 2017-01-09 ENCOUNTER — Ambulatory Visit (INDEPENDENT_AMBULATORY_CARE_PROVIDER_SITE_OTHER): Payer: Medicare Other | Admitting: Cardiology

## 2017-01-09 ENCOUNTER — Telehealth: Payer: Self-pay

## 2017-01-09 VITALS — BP 110/60 | HR 82 | Ht 70.0 in | Wt 192.0 lb

## 2017-01-09 DIAGNOSIS — I480 Paroxysmal atrial fibrillation: Secondary | ICD-10-CM | POA: Diagnosis not present

## 2017-01-09 DIAGNOSIS — I359 Nonrheumatic aortic valve disorder, unspecified: Secondary | ICD-10-CM

## 2017-01-09 DIAGNOSIS — I420 Dilated cardiomyopathy: Secondary | ICD-10-CM

## 2017-01-09 DIAGNOSIS — R5383 Other fatigue: Secondary | ICD-10-CM | POA: Diagnosis not present

## 2017-01-09 NOTE — Telephone Encounter (Signed)
Call back to patient.  He stated he has not been feeling well.  Feels dizzy, lightheaded, severely fatigued.  Patient denies shortness of breath but sounded SOB over the phone.  He stated he just thinks something is not right. Offered to check schedule for appointment with either Dr Stanford Breed today or NP, PA and he agreed.  Requested he send remote transmission.

## 2017-01-09 NOTE — Telephone Encounter (Signed)
Received voice mail message to call patient because he is not feeling well.  Attempted call back to patient and no answer.  Left message that I returned call and provide ICM number.

## 2017-01-09 NOTE — Patient Instructions (Signed)

## 2017-01-09 NOTE — Telephone Encounter (Signed)
Call back to patient.  Advised that [facility] office had an opening with Dr Sallyanne Kuster at 11:30 am.  He agreed to appointment.  Reviewed remote transmission and no fluid accumulation noted.  Thoracic impedance is trending along baseline.  Message sent to Dr Croitioru's nurse advising there is a remote transmission from today that can be reviewed by Dr Sallyanne Kuster if needed.    Optivol trend 01/09/2017

## 2017-01-09 NOTE — Progress Notes (Signed)
HPI: FU AS. Transesophageal echocardiogram in March of 2014 showed an ejection fraction of 30-35% and akinesis of the inferior and proximal septal myocardium. There was moderate aortic stenosis (AVA by planimetry 1.2-1.4 cm2) and mild mitral regurgitation. There was mild left atrial enlargement. Cardiac catheterization in April of 2014 showed pulmonary catheter wedge pressure of 13, 30-40% left main, 70 followed by 80% mid LAD, 60-70% proximal right coronary artery and 70% mid. There was moderate LV dysfunction. There was mild aortic stenosis. I reviewed films with Dr Angelena Form and medical therapy felt indicated. Patient had ICD placed in September 2014. Patient seen in January of 2015 and noted to have intermittent atrial fibrillation on his device interrogation. Echocardiogram repeated in February 2017. Ejection fraction 35-40%. Mild aortic stenosis. Mild left atrial enlargement. Had watchman 3/17. TEE April 2017 showed ejection fraction 35-40%, mild aortic insufficiency, reduced cusp excursion aortic valve, biatrial enlargement and enlargement device in place with no thrombus. Since he was last seen, patient denies dyspnea, chest pain, palpitations or syncope. Over the past 2 days he is describing fatigue.  Current Outpatient Prescriptions  Medication Sig Dispense Refill  . lisinopril (PRINIVIL,ZESTRIL) 5 MG tablet Take 1 tablet (5 mg total) by mouth daily. 30 tablet 6  . pravastatin (PRAVACHOL) 40 MG tablet Take 1 tablet (40 mg total) by mouth daily. 30 tablet 10  . alfuzosin (UROXATRAL) 10 MG 24 hr tablet Take 1 tablet by mouth daily.    Marland Kitchen aspirin 81 MG tablet TAKE 1 TABLET  BY MOUTH IN THE MORNING AND 1 TABLET BY MOUTH AT NIGHT.    Marland Kitchen carvedilol (COREG) 6.25 MG tablet Take 6.25 mg by mouth 2 (two) times daily with a meal.    . finasteride (PROSCAR) 5 MG tablet Take 1 tablet by mouth daily.    Marland Kitchen levothyroxine (SYNTHROID, LEVOTHROID) 112 MCG tablet Take 1 tablet (112 mcg total) by mouth daily  before breakfast. 90 tablet 1   No current facility-administered medications for this visit.      Past Medical History:  Diagnosis Date  . Aortic stenosis   . Barrett's esophagus   . Cancer of right lung ([place])    lower lobes  . Chronic systolic dysfunction of left ventricle   . Cirrhosis of liver ([facility])    w/o mention of alcohol  . Diverticulosis of colon   . GERD (gastroesophageal reflux disease)   . Hemorrhoids, internal   . Hyperlipidemia   . Hypertension   . Hypothyroidism   . Ischemic cardiomyopathy   . Paroxysmal atrial fibrillation ([place]) 12/14/2013   a. s/p Watchman   . Rheumatic fever   . Seborrheic dermatitis   . Sinus node dysfunction (H[facility])   . Type II diabetes mellitus ([place])    "weighed over 300#; lost weight; lost diabetes" (01/31/2016)    Past Surgical History:  Procedure Laterality Date  . ANKLE FRACTURE SURGERY Right 1940s  . APPENDECTOMY  1940s  . CARDIAC CATHETERIZATION  "years ago"  . CARDIAC DEFIBRILLATOR PLACEMENT  08/12/13   Medtronic Evera XT DR implanted by [name] for primary prevention of sudden death  . CARDIAC PACEMAKER PLACEMENT    . CATARACT EXTRACTION, BILATERAL Bilateral 8-11   Dr Ayesha Rumpf  . COLONOSCOPY    . FRACTURE SURGERY    . IMPLANTABLE CARDIOVERTER DEFIBRILLATOR IMPLANT N/A 08/12/2013   Procedure: IMPLANTABLE CARDIOVERTER DEFIBRILLATOR IMPLANT;  Surgeon: Coralyn Mark, MD;  Location: [facility] CATH LAB;  Service: Cardiovascular;  Laterality: N/A;  . JOINT REPLACEMENT    .  LEFT ATRIAL APPENDAGE OCCLUSION N/A 01/31/2016   Procedure: LEFT ATRIAL APPENDAGE OCCLUSION;  Surgeon: Thompson Grayer, MD;  Location: [facility] CV LAB;  Service: Cardiovascular;  Laterality: N/A;  . LUMBAR DISC SURGERY  X 2   "herniated discs"  . LUNG REMOVAL, PARTIAL Right 1998   for lung cancer by Dr Rollene Fare; middle and lower lobes  . TEE WITHOUT CARDIOVERSION N/A 02/14/2013   Procedure: TRANSESOPHAGEAL ECHOCARDIOGRAM (TEE);  Surgeon: Lelon Perla, MD;  Location: [facility]  ENDOSCOPY;  Service: Cardiovascular;  Laterality: N/A;  . TEE WITHOUT CARDIOVERSION N/A 01/09/2016   Procedure: TRANSESOPHAGEAL ECHOCARDIOGRAM (TEE);  Surgeon: Josue Hector, MD;  Location: [facility] ENDOSCOPY;  Service: Cardiovascular;  Laterality: N/A;  . TEE WITHOUT CARDIOVERSION N/A 03/20/2016   Procedure: TRANSESOPHAGEAL ECHOCARDIOGRAM (TEE);  Surgeon: Lelon Perla, MD;  Location: [facility] ENDOSCOPY;  Service: Cardiovascular;  Laterality: N/A;  . TOTAL HIP ARTHROPLASTY Right    Dr Alvan Dame    Social History   Social History  . Marital status: Widowed    Spouse name: Hoyle Sauer  . Number of children: 1  . Years of education: N/A   Occupational History  .  Retired   Social History Main Topics  . Smoking status: Former Smoker    Packs/day: 2.00    Years: 36.00    Types: Cigarettes    Quit date: 02/14/1989  . Smokeless tobacco: Never Used  . Alcohol use No  . Drug use: No  . Sexual activity: Not Currently   Other Topics Concern  . Not on file   Social History Narrative  . No narrative on file    Family History  Problem Relation Age of Onset  . Cancer Mother     breast  . Diabetes Mother   . Cancer Father     Lung  . Diabetes      ROS: fatigue but no fevers or chills, productive cough, hemoptysis, dysphasia, odynophagia, melena, hematochezia, dysuria, hematuria, rash, seizure activity, orthopnea, PND, pedal edema, claudication. Remaining systems are negative.  Physical Exam: Well-developed well-nourished in no acute distress.  Skin is warm and dry.  HEENT is normal.  Neck is supple.  Chest is clear to auscultation with normal expansion.  Cardiovascular exam is irregular, 3/6 systolic murmur left sternal border. S2 is preserved. Abdominal exam nontender or distended. No masses palpated. Extremities show no edema. neuro grossly intact  ECG-sinus rhythm with first-degree AV block and occasional ventricular pacing. Right bundle branch block. Left anterior fascicular  block.  A/P  1 Aortic stenosis-I will arrange follow-up echocardiogram to reassess.  2 paroxysmal atrial fibrillation-patient is status post watchman device. Continue aspirin. Continue beta blocker.  3 coronary artery disease-continue aspirin and statin. Patient did not tolerate Lipitor previously.  4 cardiomyopathy-continue ACE inhibitor and beta blocker.  5 hyperlipidemia-continue statin. Last LDL 53.  6 prior ICD-management per electrophysiology.  7 fatigue-etiology unclear. Check hemoglobin and TSH. No symptoms of progressive aortic stenosis.  Kirk Ruths, MD

## 2017-01-10 LAB — CBC
HEMATOCRIT: 44.2 % (ref 38.5–50.0)
Hemoglobin: 14.3 g/dL (ref 13.2–17.1)
MCH: 28.4 pg (ref 27.0–33.0)
MCHC: 32.4 g/dL (ref 32.0–36.0)
MCV: 87.9 fL (ref 80.0–100.0)
MPV: 10.4 fL (ref 7.5–12.5)
Platelets: 150 10*3/uL (ref 140–400)
RBC: 5.03 MIL/uL (ref 4.20–5.80)
RDW: 13.9 % (ref 11.0–15.0)
WBC: 6.5 10*3/uL (ref 3.8–10.8)

## 2017-01-10 LAB — TSH: TSH: 1.56 mIU/L (ref 0.40–4.50)

## 2017-01-11 NOTE — Progress Notes (Signed)
All labs are normal. 

## 2017-01-15 ENCOUNTER — Other Ambulatory Visit: Payer: Self-pay

## 2017-01-15 ENCOUNTER — Ambulatory Visit (HOSPITAL_COMMUNITY): Payer: Medicare Other | Attending: Cardiovascular Disease

## 2017-01-15 DIAGNOSIS — I350 Nonrheumatic aortic (valve) stenosis: Secondary | ICD-10-CM | POA: Diagnosis not present

## 2017-01-15 DIAGNOSIS — I359 Nonrheumatic aortic valve disorder, unspecified: Secondary | ICD-10-CM | POA: Diagnosis not present

## 2017-01-15 DIAGNOSIS — I501 Left ventricular failure: Secondary | ICD-10-CM | POA: Insufficient documentation

## 2017-01-16 ENCOUNTER — Ambulatory Visit (INDEPENDENT_AMBULATORY_CARE_PROVIDER_SITE_OTHER): Payer: Medicare Other

## 2017-01-16 ENCOUNTER — Ambulatory Visit (INDEPENDENT_AMBULATORY_CARE_PROVIDER_SITE_OTHER): Payer: Medicare Other | Admitting: Family Medicine

## 2017-01-16 ENCOUNTER — Encounter: Payer: Self-pay | Admitting: Family Medicine

## 2017-01-16 VITALS — BP 126/58 | HR 93 | Temp 98.4°F | Ht 70.0 in | Wt 191.0 lb

## 2017-01-16 DIAGNOSIS — R05 Cough: Secondary | ICD-10-CM

## 2017-01-16 DIAGNOSIS — R531 Weakness: Secondary | ICD-10-CM | POA: Diagnosis not present

## 2017-01-16 DIAGNOSIS — R059 Cough, unspecified: Secondary | ICD-10-CM

## 2017-01-16 DIAGNOSIS — R5383 Other fatigue: Secondary | ICD-10-CM

## 2017-01-16 DIAGNOSIS — Z85118 Personal history of other malignant neoplasm of bronchus and lung: Secondary | ICD-10-CM

## 2017-01-16 NOTE — Progress Notes (Signed)
Subjective:    Patient ID: Steven Everett, male    DOB: December 06, 1934, 82 y.o.   MRN: [id number]  HPI 82 year old male comes in today complaining of 1 day of symptoms including dry intermittant cough, headache, chills. No fever. He actually had his echocardiogram done yesterday and got good results back. Felt lightheaded immediately after the echocardiogram yesterday.No other worsening or alleviating factors. He says last week he noticed that when he went to the gym he just got completely fatigued and worn out. He did not experience actual shortness of breath or chest pain but had to go home early because he was too tired. That was very unusual for him and it was his typical workout. So he actually called the cardiologist and got in for evaluation this week. He had a normal CBC and thyroid level.   Review of Systems  BP (!) 126/58   Pulse 93   Temp 98.4 F (36.9 C)   Ht '5\' 10"'$  (1.778 m)   Wt 191 lb (86.6 kg)   SpO2 97%   BMI 27.41 kg/m     Allergies  Allergen Reactions  . Atorvastatin Other (See Comments)    joint aches    Past Medical History:  Diagnosis Date  . Aortic stenosis   . Barrett's esophagus   . Cancer of right lung ([place])    lower lobes  . Chronic systolic dysfunction of left ventricle   . Cirrhosis of liver (H[facility])    w/o mention of alcohol  . Diverticulosis of colon   . GERD (gastroesophageal reflux disease)   . Hemorrhoids, internal   . Hyperlipidemia   . Hypertension   . Hypothyroidism   . Ischemic cardiomyopathy   . Paroxysmal atrial fibrillation ([place]) 12/14/2013   a. s/p Watchman   . Rheumatic fever   . Seborrheic dermatitis   . Sinus node dysfunction (HCC)   . Type II diabetes mellitus ([place])    "weighed over 300#; lost weight; lost diabetes" (01/31/2016)    Past Surgical History:  Procedure Laterality Date  . ANKLE FRACTURE SURGERY Right 1940s  . APPENDECTOMY  1940s  . CARDIAC CATHETERIZATION  "years ago"  . CARDIAC DEFIBRILLATOR PLACEMENT   08/12/13   Medtronic Evera XT DR implanted by [name] for primary prevention of sudden death  . CARDIAC PACEMAKER PLACEMENT    . CATARACT EXTRACTION, BILATERAL Bilateral 8-11   Dr Ayesha Rumpf  . COLONOSCOPY    . FRACTURE SURGERY    . IMPLANTABLE CARDIOVERTER DEFIBRILLATOR IMPLANT N/A 08/12/2013   Procedure: IMPLANTABLE CARDIOVERTER DEFIBRILLATOR IMPLANT;  Surgeon: Coralyn Mark, MD;  Location: [facility] [place] CATH LAB;  Service: Cardiovascular;  Laterality: N/A;  . JOINT REPLACEMENT    . LEFT ATRIAL APPENDAGE OCCLUSION N/A 01/31/2016   Procedure: LEFT ATRIAL APPENDAGE OCCLUSION;  Surgeon: Thompson Grayer, MD;  Location: [place] CV LAB;  Service: Cardiovascular;  Laterality: N/A;  . LUMBAR DISC SURGERY  X 2   "herniated discs"  . LUNG REMOVAL, PARTIAL Right 1998   for lung cancer by Dr Rollene Fare; middle and lower lobes  . TEE WITHOUT CARDIOVERSION N/A 02/14/2013   Procedure: TRANSESOPHAGEAL ECHOCARDIOGRAM (TEE);  Surgeon: Lelon Perla, MD;  Location: [facility] ENDOSCOPY;  Service: Cardiovascular;  Laterality: N/A;  . TEE WITHOUT CARDIOVERSION N/A 01/09/2016   Procedure: TRANSESOPHAGEAL ECHOCARDIOGRAM (TEE);  Surgeon: Josue Hector, MD;  Location: [facility] ENDOSCOPY;  Service: Cardiovascular;  Laterality: N/A;  . TEE WITHOUT CARDIOVERSION N/A 03/20/2016   Procedure: TRANSESOPHAGEAL ECHOCARDIOGRAM (TEE);  Surgeon: Denice Bors  Stanford Breed, MD;  Location: [facility];  Service: Cardiovascular;  Laterality: N/A;  . TOTAL HIP ARTHROPLASTY Right    Dr Alvan Dame    Social History   Social History  . Marital status: Widowed    Spouse name: Hoyle Sauer  . Number of children: 1  . Years of education: N/A   Occupational History  .  Retired   Social History Main Topics  . Smoking status: Former Smoker    Packs/day: 2.00    Years: 36.00    Types: Cigarettes    Quit date: 02/14/1989  . Smokeless tobacco: Never Used  . Alcohol use No  . Drug use: No  . Sexual activity: Not Currently   Other Topics Concern  . Not on file   Social  History Narrative  . No narrative on file    Family History  Problem Relation Age of Onset  . Cancer Mother     breast  . Diabetes Mother   . Cancer Father     Lung  . Diabetes      Outpatient Encounter Prescriptions as of 01/16/2017  Medication Sig  . alfuzosin (UROXATRAL) 10 MG 24 hr tablet Take 1 tablet by mouth daily.  Marland Kitchen aspirin 81 MG tablet TAKE 1 TABLET  BY MOUTH IN THE MORNING AND 1 TABLET BY MOUTH AT NIGHT.  Marland Kitchen carvedilol (COREG) 6.25 MG tablet Take 6.25 mg by mouth 2 (two) times daily with a meal.  . finasteride (PROSCAR) 5 MG tablet Take 1 tablet by mouth daily.  Marland Kitchen levothyroxine (SYNTHROID, LEVOTHROID) 112 MCG tablet Take 1 tablet (112 mcg total) by mouth daily before breakfast.  . lisinopril (PRINIVIL,ZESTRIL) 5 MG tablet Take 1 tablet (5 mg total) by mouth daily.  . pravastatin (PRAVACHOL) 40 MG tablet Take 1 tablet (40 mg total) by mouth daily.   No facility-administered encounter medications on file as of 01/16/2017.          Objective:   Physical Exam  Constitutional: He is oriented to person, place, and time. He appears well-developed and well-nourished.  HENT:  Head: Normocephalic and atraumatic.  Right Ear: External ear normal.  Left Ear: External ear normal.  Nose: Nose normal.  Mouth/Throat: Oropharynx is clear and moist.  TMs and canals are clear.   Eyes: Conjunctivae and EOM are normal. Pupils are equal, round, and reactive to light.  Neck: Neck supple. No thyromegaly present.  Cardiovascular: Normal rate and normal heart sounds.   3/6 SEM with high pitched sound  Pulmonary/Chest: Effort normal and breath sounds normal.  Absent BS in the right lower lobe  Lymphadenopathy:    He has no cervical adenopathy.  Neurological: He is alert and oriented to person, place, and time.  Skin: Skin is warm and dry.  Psychiatric: He has a normal mood and affect.       Assessment & Plan:  Cough fatigue-unclear etiology. May just be a mild viral illness.  Certainly if he develops a fever gets worse just give [place] a call back. He actually is due for his yearly chest x-rays will do that today. He had a normal echocardiogram done yesterday so this is not the cause of his symptoms. He also had a CBC and a TSH so abnormal thyroid and anemia are are not the cause either.

## 2017-01-16 NOTE — Patient Instructions (Signed)
Call me if not feeling better into next week, or if your cough he comes more wet sounding and productive or if you run a fever.

## 2017-02-03 ENCOUNTER — Telehealth: Payer: Self-pay | Admitting: Family Medicine

## 2017-02-03 ENCOUNTER — Encounter: Payer: Self-pay | Admitting: Family Medicine

## 2017-02-03 ENCOUNTER — Ambulatory Visit (INDEPENDENT_AMBULATORY_CARE_PROVIDER_SITE_OTHER): Payer: Medicare Other | Admitting: Family Medicine

## 2017-02-03 VITALS — BP 129/69 | HR 79 | Ht 70.0 in | Wt 193.0 lb

## 2017-02-03 DIAGNOSIS — R195 Other fecal abnormalities: Secondary | ICD-10-CM | POA: Diagnosis not present

## 2017-02-03 DIAGNOSIS — R102 Pelvic and perineal pain: Secondary | ICD-10-CM

## 2017-02-03 DIAGNOSIS — R109 Unspecified abdominal pain: Secondary | ICD-10-CM

## 2017-02-03 DIAGNOSIS — Z1211 Encounter for screening for malignant neoplasm of colon: Secondary | ICD-10-CM

## 2017-02-03 LAB — POCT URINALYSIS DIPSTICK
BILIRUBIN UA: NEGATIVE
GLUCOSE UA: NEGATIVE
KETONES UA: NEGATIVE
Leukocytes, UA: NEGATIVE
Nitrite, UA: NEGATIVE
Protein, UA: NEGATIVE
RBC UA: NEGATIVE
Spec Grav, UA: 1.02
Urobilinogen, UA: 4
pH, UA: 5.5

## 2017-02-03 LAB — IFOBT (OCCULT BLOOD): IFOBT: POSITIVE

## 2017-02-03 NOTE — Telephone Encounter (Signed)
Please call patient and let him know that we did his rectal exam we did a stool test. It did come back positive showing a little bit of blood. I think his last colonoscopy was around 2009 for almost 10 years ago. I would recommend that we check this out and work it up a little bit further. If he is okay with GI referral please let me know and then find out if he has a preference for location. Or if he has already been to GI in the last several years please let me know.

## 2017-02-03 NOTE — Telephone Encounter (Signed)
Pt informed. Pt understood and was agreeable. Referral was already placed for GI by Dr. Madilyn Fireman.

## 2017-02-03 NOTE — Telephone Encounter (Signed)
LVM requesting pt to call to discuss lab results.

## 2017-02-03 NOTE — Progress Notes (Signed)
Subjective:    Patient ID: Steven Everett, male    DOB: 25-Jun-1935, 82 y.o.   MRN: [id number]  HPI 82 year old male w/ hx of BPH   comes in today with lower abdominal pain. He says it actually feels a numbness like a pressure sensation. He says after he urinates he actually gets a little bit of relief. No dysuria. No fevers chills or sweats. No hematuria. No change in frequency or odor to the urine. No change in bowels. He denies any constipation or diarrhea.   Review of Systems  BP 129/69   Pulse 79   Ht '5\' 10"'$  (1.778 m)   Wt 193 lb (87.5 kg)   SpO2 98%   BMI 27.69 kg/m     Allergies  Allergen Reactions  . Atorvastatin Other (See Comments)    joint aches    Past Medical History:  Diagnosis Date  . Aortic stenosis   . Barrett's esophagus   . Cancer of right lung ([place])    lower lobes  . Chronic systolic dysfunction of left ventricle   . Cirrhosis of liver (HCC)    w/o mention of alcohol  . Diverticulosis of colon   . GERD (gastroesophageal reflux disease)   . Hemorrhoids, internal   . Hyperlipidemia   . Hypertension   . Hypothyroidism   . Ischemic cardiomyopathy   . Paroxysmal atrial fibrillation ([place]) 12/14/2013   a. s/p Watchman   . Rheumatic fever   . Seborrheic dermatitis   . Sinus node dysfunction (HCC)   . Type II diabetes mellitus ([place])    "weighed over 300#; lost weight; lost diabetes" (01/31/2016)    Past Surgical History:  Procedure Laterality Date  . ANKLE FRACTURE SURGERY Right 1940s  . APPENDECTOMY  1940s  . CARDIAC CATHETERIZATION  "years ago"  . CARDIAC DEFIBRILLATOR PLACEMENT  08/12/13   Medtronic Evera XT DR implanted by [name] for primary prevention of sudden death  . CARDIAC PACEMAKER PLACEMENT    . CATARACT EXTRACTION, BILATERAL Bilateral 8-11   Dr Ayesha Rumpf  . COLONOSCOPY    . FRACTURE SURGERY    . IMPLANTABLE CARDIOVERTER DEFIBRILLATOR IMPLANT N/A 08/12/2013   Procedure: IMPLANTABLE CARDIOVERTER DEFIBRILLATOR IMPLANT;  Surgeon: Coralyn Mark, MD;  Location: [facility] CATH LAB;  Service: Cardiovascular;  Laterality: N/A;  . JOINT REPLACEMENT    . LEFT ATRIAL APPENDAGE OCCLUSION N/A 01/31/2016   Procedure: LEFT ATRIAL APPENDAGE OCCLUSION;  Surgeon: Thompson Grayer, MD;  Location: [facility];  Service: Cardiovascular;  Laterality: N/A;  . LUMBAR DISC SURGERY  X 2   "herniated discs"  . LUNG REMOVAL, PARTIAL Right 1998   for lung cancer by Dr Rollene Fare; middle and lower lobes  . TEE WITHOUT CARDIOVERSION N/A 02/14/2013   Procedure: TRANSESOPHAGEAL ECHOCARDIOGRAM (TEE);  Surgeon: Lelon Perla, MD;  Location: [facility] ENDOSCOPY;  Service: Cardiovascular;  Laterality: N/A;  . TEE WITHOUT CARDIOVERSION N/A 01/09/2016   Procedure: TRANSESOPHAGEAL ECHOCARDIOGRAM (TEE);  Surgeon: Josue Hector, MD;  [facility] ENDOSCOPY;  Service: Cardiovascular;  Laterality: N/A;  . TEE WITHOUT CARDIOVERSION N/A 03/20/2016   Procedure: TRANSESOPHAGEAL ECHOCARDIOGRAM (TEE);  Surgeon: Lelon Perla, MD;  Location: [facility] ENDOSCOPY;  Service: Cardiovascular;  Laterality: N/A;  . TOTAL HIP ARTHROPLASTY Right    Dr Alvan Dame    Social History   Social History  . Marital status: Widowed    Spouse name: Hoyle Sauer  . Number of children: 1  . Years of education: N/A   Occupational History  .  Retired   Social History Main Topics  . Smoking status: Former Smoker    Packs/day: 2.00    Years: 36.00    Types: Cigarettes    Quit date: 02/14/1989  . Smokeless tobacco: Never Used  . Alcohol use No  . Drug use: No  . Sexual activity: Not Currently   Other Topics Concern  . Not on file   Social History Narrative  . No narrative on file    Family History  Problem Relation Age of Onset  . Cancer Mother     breast  . Diabetes Mother   . Cancer Father     Lung  . Diabetes      Outpatient Encounter Prescriptions as of 02/03/2017  Medication Sig  . alfuzosin (UROXATRAL) 10 MG 24 hr tablet Take 1 tablet by mouth daily.  Marland Kitchen aspirin 81 MG tablet TAKE 1 TABLET   BY MOUTH IN THE MORNING AND 1 TABLET BY MOUTH AT NIGHT.  Marland Kitchen carvedilol (COREG) 6.25 MG tablet Take 6.25 mg by mouth 2 (two) times daily with a meal.  . finasteride (PROSCAR) 5 MG tablet Take 1 tablet by mouth daily.  Marland Kitchen levothyroxine (SYNTHROID, LEVOTHROID) 112 MCG tablet Take 1 tablet (112 mcg total) by mouth daily before breakfast.  . lisinopril (PRINIVIL,ZESTRIL) 5 MG tablet Take 1 tablet (5 mg total) by mouth daily.  . pravastatin (PRAVACHOL) 40 MG tablet Take 1 tablet (40 mg total) by mouth daily.   No facility-administered encounter medications on file as of 02/03/2017.          Objective:   Physical Exam  Constitutional: He is oriented to person, place, and time. He appears well-developed and well-nourished.  HENT:  Head: Normocephalic and atraumatic.  Cardiovascular: Normal rate, regular rhythm and normal heart sounds.   Pulmonary/Chest: Effort normal and breath sounds normal.  Abdominal: Soft. Bowel sounds are normal. He exhibits no distension and no mass. There is tenderness. There is no rebound and no guarding.  Mild TTP over the suprapubic area.    Genitourinary: Rectal exam shows guaiac positive stool. Rectal exam shows no external hemorrhoid, no fissure, no mass, no tenderness and anal tone normal. Prostate is enlarged. Prostate is not tender.  Neurological: He is alert and oriented to person, place, and time.  Skin: Skin is warm and dry.  Psychiatric: He has a normal mood and affect. His behavior is normal.       Assessment & Plan:  Suprapubic pain-unclear the etiology at this point. Urinalysis was normal so no sign of UTI. Prostate exam showed enlargement but no bogginess or tenderness on exam so prostatitis is less likely. He actually has been feeling a little bit better today so we'll hold off on any specific treatment. Continue his current regimen. If symptoms recur before the end of the week it is a call back and consider further workup. Could consider diverticulitis as  a possible diagnosis if symptoms recur as well.  Guaiac positive on exam today. Last colonoscopy was 2009. We'll contact patient about possible referral back to GI for further workup.

## 2017-02-06 ENCOUNTER — Ambulatory Visit (INDEPENDENT_AMBULATORY_CARE_PROVIDER_SITE_OTHER): Payer: Medicare Other

## 2017-02-06 ENCOUNTER — Telehealth: Payer: Self-pay

## 2017-02-06 DIAGNOSIS — Z95810 Presence of automatic (implantable) cardiac defibrillator: Secondary | ICD-10-CM

## 2017-02-06 DIAGNOSIS — I5022 Chronic systolic (congestive) heart failure: Secondary | ICD-10-CM | POA: Diagnosis not present

## 2017-02-06 NOTE — Progress Notes (Signed)
EPIC Encounter for ICM Monitoring  Patient Name: Steven Everett is a 82 y.o. male Date: 02/06/2017 Primary Care Physican: Beatrice Lecher, MD Primary Cardiologist:Crenshaw Electrophysiologist: Allred Dry Weight:unknown        Attempted call to patient and unable to reach.  Left detailed message regarding transmission.  Transmission reviewed.    Thoracic impedance abnormal suggesting fluid accumulation but trending back toward baseline.  Patient had PCP appointment on 02/03/2017 for lower abdominal pain per epic note  No diuretic  Labs: 10/21/2016 Creatinine 1.18, BUN 19, Potassium 4.3, Sodium 139  Recommendations:  Left voice mail with ICM number and encouraged to call for fluid symptoms.  Follow-up plan: ICM clinic phone appointment on 03/16/2017.  Copy of ICM check sent to primary cardiologist and device physician.   3 month ICM trend: 02/06/2017      1 Year ICM trend:      Rosalene Billings, RN 02/06/2017 8:45 AM

## 2017-02-06 NOTE — Telephone Encounter (Signed)
Remote ICM transmission received.  Attempted patient call and left detailed message regarding transmission and next ICM scheduled for 03/16/2017.  Advised to return call for any fluid symptoms or questions.

## 2017-03-16 ENCOUNTER — Ambulatory Visit (INDEPENDENT_AMBULATORY_CARE_PROVIDER_SITE_OTHER): Payer: Medicare Other | Admitting: *Deleted

## 2017-03-16 DIAGNOSIS — Z95810 Presence of automatic (implantable) cardiac defibrillator: Secondary | ICD-10-CM | POA: Diagnosis not present

## 2017-03-16 DIAGNOSIS — I495 Sick sinus syndrome: Secondary | ICD-10-CM

## 2017-03-16 DIAGNOSIS — I5022 Chronic systolic (congestive) heart failure: Secondary | ICD-10-CM

## 2017-03-16 NOTE — Progress Notes (Signed)
EPIC Encounter for ICM Monitoring  Patient Name: Steven Everett is a 82 y.o. male Date: 03/16/2017 Primary Care Physican: Beatrice Lecher, MD Primary Cardiologist:Crenshaw Electrophysiologist: Allred Dry Weight:unknown         Heart Failure questions reviewed, pt asymptomatic.   Thoracic impedance normal since 03/09/2017 but was abnormal suggesting fluid accumulation from ~01/29/2017 to 03/08/2017.  No diuretic  Labs: 10/21/2016 Creatinine 1.18, BUN 19, Potassium 4.3, Sodium 139  Recommendations: No changes. Advised to limit salt intake to 2000 mg/day.  Encouraged to call for fluid symptoms.  Follow-up plan: ICM clinic phone appointment on 04/16/2017.    Copy of ICM check sent to primary cardiologist and device physician.   3 month ICM trend: 03/16/2017   1 Year ICM trend:      Rosalene Billings, RN 03/16/2017 1:52 PM

## 2017-03-16 NOTE — Progress Notes (Signed)
Remote ICD transmission.   

## 2017-03-17 ENCOUNTER — Telehealth: Payer: Self-pay

## 2017-03-17 ENCOUNTER — Other Ambulatory Visit: Payer: Self-pay

## 2017-03-17 ENCOUNTER — Telehealth: Payer: Self-pay | Admitting: *Deleted

## 2017-03-17 LAB — CUP PACEART REMOTE DEVICE CHECK
Battery Remaining Longevity: 86 mo
Battery Voltage: 2.99 V
Brady Statistic AP VP Percent: 0.34 %
Brady Statistic AP VS Percent: 1.21 %
Brady Statistic RA Percent Paced: 1.48 %
Brady Statistic RV Percent Paced: 10.54 %
Date Time Interrogation Session: 20180416083727
HIGH POWER IMPEDANCE MEASURED VALUE: 71 Ohm
Implantable Lead Implant Date: 20140914
Implantable Lead Location: 753859
Implantable Lead Location: 753860
Implantable Lead Model: 5076
Implantable Pulse Generator Implant Date: 20140912
Lead Channel Impedance Value: 285 Ohm
Lead Channel Impedance Value: 399 Ohm
Lead Channel Pacing Threshold Amplitude: 0.875 V
Lead Channel Pacing Threshold Amplitude: 1 V
Lead Channel Setting Pacing Amplitude: 2.5 V
Lead Channel Setting Pacing Pulse Width: 0.4 ms
Lead Channel Setting Sensing Sensitivity: 0.3 mV
MDC IDC LEAD IMPLANT DT: 20140914
MDC IDC MSMT LEADCHNL RA IMPEDANCE VALUE: 437 Ohm
MDC IDC MSMT LEADCHNL RA PACING THRESHOLD PULSEWIDTH: 0.4 ms
MDC IDC MSMT LEADCHNL RA SENSING INTR AMPL: 2 mV
MDC IDC MSMT LEADCHNL RA SENSING INTR AMPL: 2 mV
MDC IDC MSMT LEADCHNL RV PACING THRESHOLD PULSEWIDTH: 0.4 ms
MDC IDC MSMT LEADCHNL RV SENSING INTR AMPL: 4.375 mV
MDC IDC MSMT LEADCHNL RV SENSING INTR AMPL: 4.375 mV
MDC IDC SET LEADCHNL RA PACING AMPLITUDE: 2 V
MDC IDC STAT BRADY AS VP PERCENT: 9.83 %
MDC IDC STAT BRADY AS VS PERCENT: 88.61 %

## 2017-03-17 MED ORDER — LEVOTHYROXINE SODIUM 112 MCG PO TABS: 112.0000 ug | ORAL_TABLET | Freq: Every day | ORAL | 1 refills | Status: DC

## 2017-03-17 NOTE — Telephone Encounter (Signed)
Patient left a msg on the refill vm requesting refills on all of his meds from Dr Stanford Breed be sent to [facility]. He can be reached at [phone]. Thanks, MI

## 2017-03-17 NOTE — Telephone Encounter (Signed)
Pt will be having his pharmacy changed to [facility].  Per patient, the pharmacy will notify us.

## 2017-03-18 ENCOUNTER — Encounter: Payer: Self-pay | Admitting: Cardiology

## 2017-03-18 MED ORDER — LISINOPRIL 5 MG PO TABS: ORAL_TABLET | ORAL | 6 refills | Status: DC

## 2017-03-18 MED ORDER — PRAVASTATIN SODIUM 40 MG PO TABS: 40.0000 mg | ORAL_TABLET | Freq: Every day | ORAL | 10 refills | Status: AC

## 2017-03-19 ENCOUNTER — Telehealth: Payer: Self-pay | Admitting: Cardiovascular Disease

## 2017-03-19 ENCOUNTER — Telehealth: Payer: Self-pay | Admitting: Family Medicine

## 2017-03-19 MED ORDER — CARVEDILOL 6.25 MG PO TABS: 6.2500 mg | ORAL_TABLET | Freq: Two times a day (BID) | ORAL | 3 refills | Status: DC

## 2017-03-19 NOTE — Telephone Encounter (Signed)
This is Dr. Crenshaw's pt 

## 2017-03-19 NOTE — Telephone Encounter (Signed)
His prescriptions have already been sent to [facility]

## 2017-03-19 NOTE — Telephone Encounter (Signed)
Patient called and wanted his Prescriptions called into [facility]. PH# [phone]. Patient states that he's leaving [facility] all together and needs his meds called into [facility] and this Updated in his chart.

## 2017-03-19 NOTE — Telephone Encounter (Signed)
New Message     Pt is switching all his prescriptions from [facility] to [facility], he will needs new prescriptions sent over to [facility] [phone]   Pt sees Dr Lovena Le Dr Croitoru and Dr Stanford Breed

## 2017-03-19 NOTE — Telephone Encounter (Signed)
All cardiac rx has been sent to [facility]-patient aware.

## 2017-03-24 ENCOUNTER — Telehealth: Payer: Self-pay | Admitting: Cardiology

## 2017-03-24 ENCOUNTER — Other Ambulatory Visit: Payer: Self-pay

## 2017-03-24 MED ORDER — LEVOTHYROXINE SODIUM 112 MCG PO TABS: 112.0000 ug | ORAL_TABLET | Freq: Every day | ORAL | 1 refills | Status: AC

## 2017-03-24 NOTE — Telephone Encounter (Signed)
New message      Do not use [facility] for his prescriptions, he wants to continue using [facility].   Thank you

## 2017-03-24 NOTE — Telephone Encounter (Signed)
Returned the call to the patient and informed him that [facility] would be changed to his only pharmacy per his request.

## 2017-03-30 ENCOUNTER — Inpatient Hospital Stay (HOSPITAL_COMMUNITY)
Admission: EM | Admit: 2017-03-30 | Discharge: 2017-04-09 | DRG: 233 | Disposition: A | Discharge status: Home / Self Care | Payer: Medicare Other | Attending: Cardiothoracic Surgery | Admitting: Cardiothoracic Surgery

## 2017-03-30 ENCOUNTER — Emergency Department (HOSPITAL_COMMUNITY): Payer: Medicare Other

## 2017-03-30 ENCOUNTER — Encounter (HOSPITAL_COMMUNITY): Payer: Self-pay | Admitting: Emergency Medicine

## 2017-03-30 DIAGNOSIS — L219 Seborrheic dermatitis, unspecified: Secondary | ICD-10-CM | POA: Diagnosis present

## 2017-03-30 DIAGNOSIS — E039 Hypothyroidism, unspecified: Secondary | ICD-10-CM | POA: Diagnosis present

## 2017-03-30 DIAGNOSIS — I453 Trifascicular block: Secondary | ICD-10-CM | POA: Diagnosis not present

## 2017-03-30 DIAGNOSIS — Z09 Encounter for follow-up examination after completed treatment for conditions other than malignant neoplasm: Secondary | ICD-10-CM

## 2017-03-30 DIAGNOSIS — Z888 Allergy status to other drugs, medicaments and biological substances status: Secondary | ICD-10-CM

## 2017-03-30 DIAGNOSIS — J9859 Other diseases of mediastinum, not elsewhere classified: Secondary | ICD-10-CM | POA: Diagnosis not present

## 2017-03-30 DIAGNOSIS — Z950 Presence of cardiac pacemaker: Secondary | ICD-10-CM

## 2017-03-30 DIAGNOSIS — J9811 Atelectasis: Secondary | ICD-10-CM | POA: Diagnosis not present

## 2017-03-30 DIAGNOSIS — I480 Paroxysmal atrial fibrillation: Secondary | ICD-10-CM | POA: Diagnosis present

## 2017-03-30 DIAGNOSIS — I5022 Chronic systolic (congestive) heart failure: Secondary | ICD-10-CM | POA: Diagnosis present

## 2017-03-30 DIAGNOSIS — Z803 Family history of malignant neoplasm of breast: Secondary | ICD-10-CM

## 2017-03-30 DIAGNOSIS — R0789 Other chest pain: Secondary | ICD-10-CM | POA: Diagnosis not present

## 2017-03-30 DIAGNOSIS — D62 Acute posthemorrhagic anemia: Secondary | ICD-10-CM | POA: Diagnosis not present

## 2017-03-30 DIAGNOSIS — Z9689 Presence of other specified functional implants: Secondary | ICD-10-CM

## 2017-03-30 DIAGNOSIS — C3490 Malignant neoplasm of unspecified part of unspecified bronchus or lung: Secondary | ICD-10-CM

## 2017-03-30 DIAGNOSIS — I2510 Atherosclerotic heart disease of native coronary artery without angina pectoris: Secondary | ICD-10-CM | POA: Diagnosis present

## 2017-03-30 DIAGNOSIS — Z96641 Presence of right artificial hip joint: Secondary | ICD-10-CM | POA: Diagnosis present

## 2017-03-30 DIAGNOSIS — E785 Hyperlipidemia, unspecified: Secondary | ICD-10-CM | POA: Diagnosis present

## 2017-03-30 DIAGNOSIS — I700 Atherosclerosis of aorta: Secondary | ICD-10-CM | POA: Diagnosis present

## 2017-03-30 DIAGNOSIS — K2270 Barrett's esophagus without dysplasia: Secondary | ICD-10-CM | POA: Diagnosis present

## 2017-03-30 DIAGNOSIS — Z951 Presence of aortocoronary bypass graft: Secondary | ICD-10-CM

## 2017-03-30 DIAGNOSIS — Z9842 Cataract extraction status, left eye: Secondary | ICD-10-CM

## 2017-03-30 DIAGNOSIS — Z85118 Personal history of other malignant neoplasm of bronchus and lung: Secondary | ICD-10-CM

## 2017-03-30 DIAGNOSIS — Z801 Family history of malignant neoplasm of trachea, bronchus and lung: Secondary | ICD-10-CM

## 2017-03-30 DIAGNOSIS — K219 Gastro-esophageal reflux disease without esophagitis: Secondary | ICD-10-CM | POA: Diagnosis present

## 2017-03-30 DIAGNOSIS — N289 Disorder of kidney and ureter, unspecified: Secondary | ICD-10-CM | POA: Diagnosis not present

## 2017-03-30 DIAGNOSIS — Z923 Personal history of irradiation: Secondary | ICD-10-CM

## 2017-03-30 DIAGNOSIS — K7460 Unspecified cirrhosis of liver: Secondary | ICD-10-CM | POA: Diagnosis present

## 2017-03-30 DIAGNOSIS — Z9221 Personal history of antineoplastic chemotherapy: Secondary | ICD-10-CM

## 2017-03-30 DIAGNOSIS — I110 Hypertensive heart disease with heart failure: Secondary | ICD-10-CM | POA: Diagnosis present

## 2017-03-30 DIAGNOSIS — I2584 Coronary atherosclerosis due to calcified coronary lesion: Secondary | ICD-10-CM | POA: Diagnosis present

## 2017-03-30 DIAGNOSIS — I214 Non-ST elevation (NSTEMI) myocardial infarction: Principal | ICD-10-CM | POA: Diagnosis present

## 2017-03-30 DIAGNOSIS — I350 Nonrheumatic aortic (valve) stenosis: Secondary | ICD-10-CM | POA: Diagnosis present

## 2017-03-30 DIAGNOSIS — R079 Chest pain, unspecified: Secondary | ICD-10-CM | POA: Diagnosis not present

## 2017-03-30 DIAGNOSIS — I25110 Atherosclerotic heart disease of native coronary artery with unstable angina pectoris: Secondary | ICD-10-CM | POA: Diagnosis present

## 2017-03-30 DIAGNOSIS — Z87891 Personal history of nicotine dependence: Secondary | ICD-10-CM

## 2017-03-30 DIAGNOSIS — I452 Bifascicular block: Secondary | ICD-10-CM | POA: Diagnosis present

## 2017-03-30 DIAGNOSIS — K5730 Diverticulosis of large intestine without perforation or abscess without bleeding: Secondary | ICD-10-CM | POA: Diagnosis present

## 2017-03-30 DIAGNOSIS — Z833 Family history of diabetes mellitus: Secondary | ICD-10-CM

## 2017-03-30 DIAGNOSIS — Z9841 Cataract extraction status, right eye: Secondary | ICD-10-CM

## 2017-03-30 DIAGNOSIS — Y842 Radiological procedure and radiotherapy as the cause of abnormal reaction of the patient, or of later complication, without mention of misadventure at the time of the procedure: Secondary | ICD-10-CM | POA: Diagnosis present

## 2017-03-30 DIAGNOSIS — Z95810 Presence of automatic (implantable) cardiac defibrillator: Secondary | ICD-10-CM

## 2017-03-30 DIAGNOSIS — Z7982 Long term (current) use of aspirin: Secondary | ICD-10-CM

## 2017-03-30 DIAGNOSIS — I255 Ischemic cardiomyopathy: Secondary | ICD-10-CM | POA: Diagnosis present

## 2017-03-30 LAB — BASIC METABOLIC PANEL
ANION GAP: 8 (ref 5–15)
BUN: 15 mg/dL (ref 6–20)
CALCIUM: 8.9 mg/dL (ref 8.9–10.3)
CO2: 26 mmol/L (ref 22–32)
Chloride: 105 mmol/L (ref 101–111)
Creatinine, Ser: 1.04 mg/dL (ref 0.61–1.24)
Glucose, Bld: 102 mg/dL — ABNORMAL HIGH (ref 65–99)
Potassium: 3.8 mmol/L (ref 3.5–5.1)
Sodium: 139 mmol/L (ref 135–145)

## 2017-03-30 LAB — CBC
HCT: 39.6 % (ref 39.0–52.0)
HEMOGLOBIN: 12.8 g/dL — AB (ref 13.0–17.0)
MCH: 28.3 pg (ref 26.0–34.0)
MCHC: 32.3 g/dL (ref 30.0–36.0)
MCV: 87.4 fL (ref 78.0–100.0)
Platelets: 139 10*3/uL — ABNORMAL LOW (ref 150–400)
RBC: 4.53 MIL/uL (ref 4.22–5.81)
RDW: 14.8 % (ref 11.5–15.5)
WBC: 4.8 10*3/uL (ref 4.0–10.5)

## 2017-03-30 LAB — I-STAT TROPONIN, ED: TROPONIN I, POC: 0.31 ng/mL — AB (ref 0.00–0.08)

## 2017-03-30 MED ORDER — ACETAMINOPHEN 325 MG PO TABS: 650.0000 mg | ORAL_TABLET | ORAL | Status: DC | PRN

## 2017-03-30 MED ORDER — PRAVASTATIN SODIUM 40 MG PO TABS
40.0000 mg | ORAL_TABLET | Freq: Every day | ORAL | Status: DC
  Administered 2017-04-02 – 2017-04-09 (×8): 40 mg via ORAL
  Filled 2017-03-30 (×9): qty 1

## 2017-03-30 MED ORDER — ASPIRIN EC 81 MG PO TBEC
81.0000 mg | DELAYED_RELEASE_TABLET | Freq: Every day | ORAL | Status: DC
  Administered 2017-03-31: 81 mg via ORAL
  Filled 2017-03-30: qty 1

## 2017-03-30 MED ORDER — HEPARIN (PORCINE) IN NACL 100-0.45 UNIT/ML-% IJ SOLN
1100.0000 [IU]/h | INTRAMUSCULAR | Status: DC
  Administered 2017-03-30: 1100 [IU]/h via INTRAVENOUS
  Filled 2017-03-30: qty 250

## 2017-03-30 MED ORDER — LISINOPRIL 5 MG PO TABS
5.0000 mg | ORAL_TABLET | Freq: Every day | ORAL | Status: DC
  Filled 2017-03-30: qty 1

## 2017-03-30 MED ORDER — ASPIRIN 81 MG PO CHEW: 324.0000 mg | CHEWABLE_TABLET | ORAL | Status: DC

## 2017-03-30 MED ORDER — LEVOTHYROXINE SODIUM 112 MCG PO TABS
112.0000 ug | ORAL_TABLET | Freq: Every day | ORAL | Status: DC
  Administered 2017-04-02 – 2017-04-09 (×8): 112 ug via ORAL
  Filled 2017-03-30 (×9): qty 1

## 2017-03-30 MED ORDER — ASPIRIN 300 MG RE SUPP: 300.0000 mg | RECTAL | Status: DC

## 2017-03-30 MED ORDER — HEPARIN SODIUM (PORCINE) 5000 UNIT/ML IJ SOLN: 4000.0000 [IU] | Freq: Once | INTRAMUSCULAR | Status: DC

## 2017-03-30 MED ORDER — ONDANSETRON HCL 4 MG/2ML IJ SOLN: 4.0000 mg | Freq: Four times a day (QID) | INTRAMUSCULAR | Status: DC | PRN

## 2017-03-30 MED ORDER — ASPIRIN 81 MG PO TABS: 81.0000 mg | ORAL_TABLET | Freq: Once | ORAL | Status: DC

## 2017-03-30 MED ORDER — CARVEDILOL 6.25 MG PO TABS
6.2500 mg | ORAL_TABLET | Freq: Two times a day (BID) | ORAL | Status: DC
  Administered 2017-03-31: 6.25 mg via ORAL
  Filled 2017-03-30 (×2): qty 1

## 2017-03-30 MED ORDER — HEPARIN BOLUS VIA INFUSION
4000.0000 [IU] | Freq: Once | INTRAVENOUS | Status: AC
  Administered 2017-03-30: 4000 [IU] via INTRAVENOUS
  Filled 2017-03-30: qty 4000

## 2017-03-30 MED ORDER — NITROGLYCERIN 0.4 MG SL SUBL: 0.4000 mg | SUBLINGUAL_TABLET | SUBLINGUAL | Status: DC | PRN

## 2017-03-30 NOTE — ED Notes (Signed)
PA Dorothea Ogle made aware of elevated trop.

## 2017-03-30 NOTE — ED Notes (Signed)
Patient transported to X-ray 

## 2017-03-30 NOTE — ED Triage Notes (Signed)
Per EMS: pt was working out this morning at 0700 and started to get some CP that radiated top his L Pain.  Pt currently not c/o anything. Pt has Cardiac Hx and has a Pacemaker and Defib. Pt is A&Ox4. VSS. Pt received 324 of ASP.

## 2017-03-30 NOTE — ED Provider Notes (Signed)
[facility] DEPT Provider Note   CSN: [id number] Arrival date & time: 03/30/17  1948     History   Chief Complaint Chief Complaint  Patient presents with  . Chest Pain    HPI Steven Everett is a 82 y.o. male.  HPI  82 y.o. male with a hx of CHF, Cirrhosis, HTN, HLD, Paroxysmal Atrial Fibrillation, DM2, presents to the Emergency Department today via EMS complaining of chest pain while working out this AM around 0700. Pt states that he was on the elliptical as well as a treadmill when he started having heavy pressure in right side of chest. Noted initial radiation of pain in left arm. Pt states that he sat down and pain went away in 15 minutes. Notes pain returned later in the day while at rest and notified EMS. Given 4 Baby ASA. Notes no pain currently. No N/V. No diaphoresis. No Cough or URI symptoms. Pt has a pacemaker and defibrillator.   Cardiologist- Dr. Stanford Breed   01-15-17 Echocardiogram - Left ventricle: The cavity size was normal. Wall thickness was   normal. Systolic function was normal. The estimated ejection   fraction was in the range of 60% to 65%. Wall motion was normal;   there were no regional wall motion abnormalities. Features are   consistent with a pseudonormal left ventricular filling pattern,   with concomitant abnormal relaxation and increased filling   pressure (grade 2 diastolic dysfunction). - Aortic valve: Moderately to severely calcified annulus.   Moderately thickened, moderately calcified leaflets. There was   mild stenosis. Mean gradient (S): 16 mm Hg. Peak gradient (S): 31   mm Hg.  03-08-13 Cardiac Catheterization 1. Severe calcified CAD involving the LAD and RCA.    Right Coronary Artery: large, dominant, heavily calcified. Proximal 60-70%, mid 70% eccentric stenosis  Left anterior Descending: heavily calcified, proximal ulceration 30%, mid vessel is heavily calcified,  Sequential 70% and then 80%, eccentric stenosis. 2. Mild aortic  stenosis  Past Medical History:  Diagnosis Date  . Aortic stenosis   . Barrett's esophagus   . Cancer of right lung ([place])    lower lobes  . Chronic systolic dysfunction of left ventricle   . Cirrhosis of liver (HCC)    w/o mention of alcohol  . Diverticulosis of colon   . GERD (gastroesophageal reflux disease)   . Hemorrhoids, internal   . Hyperlipidemia   . Hypertension   . Hypothyroidism   . Ischemic cardiomyopathy   . Paroxysmal atrial fibrillation ([place]) 12/14/2013   a. s/p Watchman   . Rheumatic fever   . Seborrheic dermatitis   . Sinus node dysfunction (HCC)   . Type II diabetes mellitus ([place])    "weighed over 300#; lost weight; lost diabetes" (01/31/2016)    Patient Active Problem List   Diagnosis Date Noted  . Persistent atrial fibrillation ([place]) 01/31/2016  . IFG (impaired fasting glucose) 05/15/2015  . CKD (chronic kidney disease) 05/15/2015  . ICD (implantable cardioverter-defibrillator) in place 01/10/2015  . Atrial fibrillation ([place]) 12/14/2013  . Sick sinus syndrome ([place]) 12/14/2013  . Chronic systolic heart failure ([place]) 08/12/2013  . Symptomatic bradycardia 08/12/2013  . Hyperlipidemia 07/20/2013  . CAD (coronary artery disease) 04/06/2013  . Congestive dilated cardiomyopathy ([place]) 01/26/2013  . Aortic stenosis 01/26/2013  . SHOULDER PAIN, RIGHT 02/03/2011  . Benign prostatic hypertrophy with urinary obstruction 07/20/2008  . ADENOMATOUS COLONIC POLYP 01/24/2008  . HEMORRHOIDS, INTERNAL 01/11/2008  . DIVERTICULOSIS, COLON 01/11/2008  . Hepatic cirrhosis ([place]) 11/16/2007  .  History of lung cancer 10/06/2007    Class: History of  . SEBORRHEIC DERMATITIS 10/04/2007  . BARRETTS ESOPHAGUS 01/01/2007  . Hypothyroidism 12/22/2006  . GERD 12/22/2006    Past Surgical History:  Procedure Laterality Date  . ANKLE FRACTURE SURGERY Right 1940s  . APPENDECTOMY  1940s  . CARDIAC CATHETERIZATION  "years ago"  . CARDIAC DEFIBRILLATOR PLACEMENT  08/12/13    Medtronic Evera XT DR implanted by [name] for primary prevention of sudden death  . CARDIAC PACEMAKER PLACEMENT    . CATARACT EXTRACTION, BILATERAL Bilateral 8-11   Dr Ayesha Rumpf  . COLONOSCOPY    . FRACTURE SURGERY    . IMPLANTABLE CARDIOVERTER DEFIBRILLATOR IMPLANT N/A 08/12/2013   Procedure: IMPLANTABLE CARDIOVERTER DEFIBRILLATOR IMPLANT;  Surgeon: Coralyn Mark, MD;  Location: [facility] CATH LAB;  Service: Cardiovascular;  Laterality: N/A;  . JOINT REPLACEMENT    . LEFT ATRIAL APPENDAGE OCCLUSION N/A 01/31/2016   Procedure: LEFT ATRIAL APPENDAGE OCCLUSION;  Surgeon: Thompson Grayer, MD;  Location: [facility] CV LAB;  Service: Cardiovascular;  Laterality: N/A;  . LUMBAR DISC SURGERY  X 2   "herniated discs"  . LUNG REMOVAL, PARTIAL Right 1998   for lung cancer by Dr Rollene Fare; middle and lower lobes  . TEE WITHOUT CARDIOVERSION N/A 02/14/2013   Procedure: TRANSESOPHAGEAL ECHOCARDIOGRAM (TEE);  Surgeon: Lelon Perla, MD;  Location: [facility] ENDOSCOPY;  Service: Cardiovascular;  Laterality: N/A;  . TEE WITHOUT CARDIOVERSION N/A 01/09/2016   Procedure: TRANSESOPHAGEAL ECHOCARDIOGRAM (TEE);  Surgeon: Josue Hector, MD;  Location: [facility] ENDOSCOPY;  Service: Cardiovascular;  Laterality: N/A;  . TEE WITHOUT CARDIOVERSION N/A 03/20/2016   Procedure: TRANSESOPHAGEAL ECHOCARDIOGRAM (TEE);  Surgeon: Lelon Perla, MD;  Location: [facility] ENDOSCOPY;  Service: Cardiovascular;  Laterality: N/A;  . TOTAL HIP ARTHROPLASTY Right    Dr Alvan Dame       Home Medications    Prior to Admission medications   Medication Sig Start Date End Date Taking? Authorizing Provider  alfuzosin (UROXATRAL) 10 MG 24 hr tablet Take 1 tablet by mouth daily. 01/05/17   Historical Provider, MD  aspirin 81 MG tablet TAKE 1 TABLET  BY MOUTH IN THE MORNING AND 1 TABLET BY MOUTH AT NIGHT.    Historical Provider, MD  carvedilol (COREG) 6.25 MG tablet Take 1 tablet (6.25 mg total) by mouth 2 (two) times daily with a meal. 03/19/17   Lelon Perla, MD   finasteride (PROSCAR) 5 MG tablet Take 1 tablet by mouth daily. 01/01/17   Historical Provider, MD  levothyroxine (SYNTHROID, LEVOTHROID) 112 MCG tablet Take 1 tablet (112 mcg total) by mouth daily before breakfast. 03/24/17   Hali Marry, MD  lisinopril (PRINIVIL,ZESTRIL) 5 MG tablet Take 1 tablet (5 mg total) by mouth daily. 03/18/17   Lelon Perla, MD  pravastatin (PRAVACHOL) 40 MG tablet Take 1 tablet (40 mg total) by mouth daily. 03/18/17   Lelon Perla, MD    Family History Family History  Problem Relation Age of Onset  . Cancer Mother     breast  . Diabetes Mother   . Cancer Father     Lung  . Diabetes      Social History Social History  Substance Use Topics  . Smoking status: Former Smoker    Packs/day: 2.00    Years: 36.00    Types: Cigarettes    Quit date: 02/14/1989  . Smokeless tobacco: Never Used  . Alcohol use No     Allergies   Atorvastatin   Review  of Systems Review of Systems ROS reviewed and all are negative for acute change except as noted in the HPI.  Physical Exam Updated Vital Signs BP 134/81 (BP Location: Right Arm)   Pulse 86   Temp 97.8 F (36.6 C) (Oral)   Resp 13   Ht '6\' 1"'$  (1.854 m)   Wt 86.2 kg   SpO2 100%   BMI 25.07 kg/m   Physical Exam  Constitutional: He is oriented to person, place, and time. Vital signs are normal. He appears well-developed and well-nourished.  HENT:  Head: Normocephalic and atraumatic.  Right Ear: Hearing normal.  Left Ear: Hearing normal.  Eyes: Conjunctivae and EOM are normal. Pupils are equal, round, and reactive to light.  Neck: Normal range of motion.  Cardiovascular: Normal rate, regular rhythm, normal heart sounds and intact distal pulses.   Pulmonary/Chest: Effort normal and breath sounds normal.  Abdominal: Soft.  Musculoskeletal: Normal range of motion.  Neurological: He is alert and oriented to person, place, and time.  Skin: Skin is warm and dry.  Psychiatric: He has a normal  mood and affect. His speech is normal and behavior is normal. Thought content normal.  Nursing note and vitals reviewed.  ED Treatments / Results  Labs (all labs ordered are listed, but only abnormal results are displayed) Labs Reviewed  BASIC METABOLIC PANEL - Abnormal; Notable for the following:       Result Value   Glucose, Bld 102 (*)    All other components within normal limits  CBC - Abnormal; Notable for the following:    Hemoglobin 12.8 (*)    Platelets 139 (*)    All other components within normal limits  I-STAT TROPOININ, ED - Abnormal; Notable for the following:    Troponin i, poc 0.31 (*)    All other components within normal limits    EKG  EKG Interpretation  Date/Time:  Monday March 30 2017 19:50:42 EDT Ventricular Rate:  86 PR Interval:    QRS Duration: 163 QT Interval:  408 QTC Calculation: 488 R Axis:   -104 Text Interpretation:  Sinus rhythm Prolonged PR interval RBBB and LAFB since last tracing no significant change Confirmed by Eulis Foster  MD, ELLIOTT ([id number]) on 03/30/2017 8:02:00 PM      Radiology No results found.  Procedures Procedures (including critical care time) CRITICAL CARE Performed by: Ozella Rocks   Total critical care time: 35 minutes  Critical care time was exclusive of separately billable procedures and treating other patients.  Critical care was necessary to treat or prevent imminent or life-threatening deterioration.  Critical care was time spent personally by me on the following activities: development of treatment plan with patient and/or surrogate as well as nursing, discussions with consultants, evaluation of patient's response to treatment, examination of patient, obtaining history from patient or surrogate, ordering and performing treatments and interventions, ordering and review of laboratory studies, ordering and review of radiographic studies, pulse oximetry and re-evaluation of patient's condition.   Medications Ordered in  ED Medications  heparin injection 4,000 Units (not administered)     Initial Impression / Assessment and Plan / ED Course  I have reviewed the triage vital signs and the nursing notes.  Pertinent labs & imaging results that were available during my care of the patient were reviewed by me and considered in my medical decision making (see chart for details).  Final Clinical Impressions(s) / ED Diagnoses  {I have reviewed and evaluated the relevant laboratory values. {I have reviewed  and evaluated the relevant imaging studies. {I have interpreted the relevant EKG. {I have reviewed the relevant previous healthcare records. {I have reviewed EMS Documentation. {I obtained HPI from historian. {Patient discussed with supervising physician.  ED Course:  Assessment: Pt is a 82 y.o. male Hx CHF, Cirrhosis, HTN, HLD, Paroxysmal Atrial Fibrillation with pacemaker and defibrillator, DM2 presents with CP this AM around 0700 with exertion on treadmill. Pain went away after 15 min at rest. Notes pain returned later today at rest and notified EMS. Given 4 baby ASA PTA. Cardiology has been consulted and will see patient in the ED for admit. EKG unremarkable. Trop elevated 0.31. Labs unremarkable. CXR unremarkable. Started on Heparin drip for NSTEMI. Pt has been re-evaluated prior to consult and VSS, NAD, heart RRR, pain 0/10, lungs CTAB.   Disposition/Plan:  Admit Pt acknowledges and agrees with plan  Supervising Physician Daleen Bo, MD  Final diagnoses:  NSTEMI (non-ST elevated myocardial infarction) [facility])    New Prescriptions New Prescriptions   No medications on file       Shary Decamp, PA-C 03/30/17 2055    Daleen Bo, MD 04/02/17 [phone]

## 2017-03-30 NOTE — Progress Notes (Signed)
ANTICOAGULATION CONSULT NOTE - Initial Consult  Pharmacy Consult for heparin Indication: chest pain/ACS  Allergies  Allergen Reactions  . Atorvastatin Other (See Comments)    joint aches    Patient Measurements: Height: '6\' 1"'$  (185.4 cm) Weight: 190 lb (86.2 kg) IBW/kg (Calculated) : 79.9 Heparin Dosing Weight: 86.2kg  Vital Signs: Temp: 97.8 F (36.6 C) (04/30 1950) Temp Source: Oral (04/30 1950) BP: 109/68 (04/30 2045) Pulse Rate: 76 (04/30 2045)  Labs:  Recent Labs  03/30/17 2005  HGB 12.8*  HCT 39.6  PLT 139*  CREATININE 1.04    Estimated Creatinine Clearance: 64 mL/min (by C-G formula based on SCr of 1.04 mg/dL).   Medical History: Past Medical History:  Diagnosis Date  . Aortic stenosis   . Barrett's esophagus   . Cancer of right lung ([place])    lower lobes  . Chronic systolic dysfunction of left ventricle   . Cirrhosis of liver (HCC)    w/o mention of alcohol  . Diverticulosis of colon   . GERD (gastroesophageal reflux disease)   . Hemorrhoids, internal   . Hyperlipidemia   . Hypertension   . Hypothyroidism   . Ischemic cardiomyopathy   . Paroxysmal atrial fibrillation ([place]) 12/14/2013   a. s/p Watchman   . Rheumatic fever   . Seborrheic dermatitis   . Sinus node dysfunction (HCC)   . Type II diabetes mellitus ([place])    "weighed over 300#; lost weight; lost diabetes" (01/31/2016)    Medications:  Infusions:  . heparin     Assessment: 37 yom presented to the ED with CP. Troponin elevated and now starting IV heparin. Baseline H/H is WNL but platelets are slightly low. Will monitor. He is not on anticoagulation PTA.   Goal of Therapy:  Heparin level 0.3-0.7 units/ml Monitor platelets by anticoagulation protocol: Yes   Plan:  Heparin bolus 4000 units IV x 1  Heparin gtt 1100 units/hr Check an 8 hr heparin level Daily heparin level and CBC  Ajah Vanhoose, Rande Lawman 03/30/2017,9:12 PM

## 2017-03-30 NOTE — ED Notes (Addendum)
Elevated Trop reported to [facility] who stated that they would report the result to [place]-PA

## 2017-03-31 ENCOUNTER — Observation Stay (HOSPITAL_BASED_OUTPATIENT_CLINIC_OR_DEPARTMENT_OTHER): Payer: Medicare Other

## 2017-03-31 ENCOUNTER — Observation Stay (HOSPITAL_COMMUNITY): Payer: Medicare Other

## 2017-03-31 ENCOUNTER — Encounter (HOSPITAL_COMMUNITY): Payer: Self-pay | Admitting: Cardiology

## 2017-03-31 ENCOUNTER — Encounter (HOSPITAL_COMMUNITY)
Admission: EM | Disposition: A | Discharge status: Home / Self Care | Payer: Self-pay | Source: Home / Self Care | Attending: Cardiothoracic Surgery

## 2017-03-31 ENCOUNTER — Ambulatory Visit: Payer: Medicare Other | Admitting: Gastroenterology

## 2017-03-31 ENCOUNTER — Telehealth: Payer: Self-pay | Admitting: Gastroenterology

## 2017-03-31 ENCOUNTER — Other Ambulatory Visit: Payer: Self-pay | Admitting: *Deleted

## 2017-03-31 DIAGNOSIS — Y842 Radiological procedure and radiotherapy as the cause of abnormal reaction of the patient, or of later complication, without mention of misadventure at the time of the procedure: Secondary | ICD-10-CM | POA: Diagnosis present

## 2017-03-31 DIAGNOSIS — Z4502 Encounter for adjustment and management of automatic implantable cardiac defibrillator: Secondary | ICD-10-CM | POA: Diagnosis not present

## 2017-03-31 DIAGNOSIS — I110 Hypertensive heart disease with heart failure: Secondary | ICD-10-CM | POA: Diagnosis not present

## 2017-03-31 DIAGNOSIS — I255 Ischemic cardiomyopathy: Secondary | ICD-10-CM | POA: Diagnosis present

## 2017-03-31 DIAGNOSIS — I2510 Atherosclerotic heart disease of native coronary artery without angina pectoris: Secondary | ICD-10-CM

## 2017-03-31 DIAGNOSIS — K7460 Unspecified cirrhosis of liver: Secondary | ICD-10-CM | POA: Diagnosis present

## 2017-03-31 DIAGNOSIS — I25110 Atherosclerotic heart disease of native coronary artery with unstable angina pectoris: Secondary | ICD-10-CM | POA: Diagnosis not present

## 2017-03-31 DIAGNOSIS — I4891 Unspecified atrial fibrillation: Secondary | ICD-10-CM | POA: Diagnosis not present

## 2017-03-31 DIAGNOSIS — I083 Combined rheumatic disorders of mitral, aortic and tricuspid valves: Secondary | ICD-10-CM | POA: Diagnosis not present

## 2017-03-31 DIAGNOSIS — K219 Gastro-esophageal reflux disease without esophagitis: Secondary | ICD-10-CM | POA: Diagnosis present

## 2017-03-31 DIAGNOSIS — Z7982 Long term (current) use of aspirin: Secondary | ICD-10-CM | POA: Diagnosis not present

## 2017-03-31 DIAGNOSIS — I453 Trifascicular block: Secondary | ICD-10-CM | POA: Diagnosis not present

## 2017-03-31 DIAGNOSIS — I2583 Coronary atherosclerosis due to lipid rich plaque: Secondary | ICD-10-CM | POA: Diagnosis not present

## 2017-03-31 DIAGNOSIS — D62 Acute posthemorrhagic anemia: Secondary | ICD-10-CM | POA: Diagnosis not present

## 2017-03-31 DIAGNOSIS — Z96641 Presence of right artificial hip joint: Secondary | ICD-10-CM | POA: Diagnosis present

## 2017-03-31 DIAGNOSIS — Z95810 Presence of automatic (implantable) cardiac defibrillator: Secondary | ICD-10-CM | POA: Diagnosis not present

## 2017-03-31 DIAGNOSIS — I350 Nonrheumatic aortic (valve) stenosis: Secondary | ICD-10-CM | POA: Diagnosis not present

## 2017-03-31 DIAGNOSIS — Z01810 Encounter for preprocedural cardiovascular examination: Secondary | ICD-10-CM

## 2017-03-31 DIAGNOSIS — Z4682 Encounter for fitting and adjustment of non-vascular catheter: Secondary | ICD-10-CM | POA: Diagnosis not present

## 2017-03-31 DIAGNOSIS — I480 Paroxysmal atrial fibrillation: Secondary | ICD-10-CM | POA: Diagnosis not present

## 2017-03-31 DIAGNOSIS — K5730 Diverticulosis of large intestine without perforation or abscess without bleeding: Secondary | ICD-10-CM | POA: Diagnosis not present

## 2017-03-31 DIAGNOSIS — E785 Hyperlipidemia, unspecified: Secondary | ICD-10-CM | POA: Diagnosis not present

## 2017-03-31 DIAGNOSIS — I2584 Coronary atherosclerosis due to calcified coronary lesion: Secondary | ICD-10-CM | POA: Diagnosis present

## 2017-03-31 DIAGNOSIS — Z951 Presence of aortocoronary bypass graft: Secondary | ICD-10-CM | POA: Diagnosis not present

## 2017-03-31 DIAGNOSIS — J9811 Atelectasis: Secondary | ICD-10-CM | POA: Diagnosis not present

## 2017-03-31 DIAGNOSIS — R079 Chest pain, unspecified: Secondary | ICD-10-CM | POA: Diagnosis not present

## 2017-03-31 DIAGNOSIS — K2270 Barrett's esophagus without dysplasia: Secondary | ICD-10-CM | POA: Diagnosis not present

## 2017-03-31 DIAGNOSIS — J9859 Other diseases of mediastinum, not elsewhere classified: Secondary | ICD-10-CM | POA: Diagnosis not present

## 2017-03-31 DIAGNOSIS — R918 Other nonspecific abnormal finding of lung field: Secondary | ICD-10-CM | POA: Diagnosis not present

## 2017-03-31 DIAGNOSIS — Z9841 Cataract extraction status, right eye: Secondary | ICD-10-CM | POA: Diagnosis not present

## 2017-03-31 DIAGNOSIS — I5022 Chronic systolic (congestive) heart failure: Secondary | ICD-10-CM | POA: Diagnosis not present

## 2017-03-31 DIAGNOSIS — N189 Chronic kidney disease, unspecified: Secondary | ICD-10-CM | POA: Diagnosis not present

## 2017-03-31 DIAGNOSIS — E1122 Type 2 diabetes mellitus with diabetic chronic kidney disease: Secondary | ICD-10-CM | POA: Diagnosis not present

## 2017-03-31 DIAGNOSIS — R0602 Shortness of breath: Secondary | ICD-10-CM | POA: Diagnosis not present

## 2017-03-31 DIAGNOSIS — Z85118 Personal history of other malignant neoplasm of bronchus and lung: Secondary | ICD-10-CM | POA: Diagnosis not present

## 2017-03-31 DIAGNOSIS — I452 Bifascicular block: Secondary | ICD-10-CM | POA: Diagnosis not present

## 2017-03-31 DIAGNOSIS — E039 Hypothyroidism, unspecified: Secondary | ICD-10-CM | POA: Diagnosis present

## 2017-03-31 DIAGNOSIS — Z9842 Cataract extraction status, left eye: Secondary | ICD-10-CM | POA: Diagnosis not present

## 2017-03-31 DIAGNOSIS — I214 Non-ST elevation (NSTEMI) myocardial infarction: Secondary | ICD-10-CM | POA: Diagnosis not present

## 2017-03-31 DIAGNOSIS — I130 Hypertensive heart and chronic kidney disease with heart failure and stage 1 through stage 4 chronic kidney disease, or unspecified chronic kidney disease: Secondary | ICD-10-CM | POA: Diagnosis not present

## 2017-03-31 DIAGNOSIS — Z950 Presence of cardiac pacemaker: Secondary | ICD-10-CM | POA: Diagnosis not present

## 2017-03-31 DIAGNOSIS — Z452 Encounter for adjustment and management of vascular access device: Secondary | ICD-10-CM | POA: Diagnosis not present

## 2017-03-31 HISTORY — PX: RIGHT/LEFT HEART CATH AND CORONARY ANGIOGRAPHY: CATH118266

## 2017-03-31 LAB — PULMONARY FUNCTION TEST
FEF 25-75 Post: 0.8 L/sec
FEF 25-75 Pre: 0.79 L/sec
FEF2575-%Change-Post: 1 %
FEF2575-%Pred-Post: 40 %
FEF2575-%Pred-Pre: 40 %
FEV1-%Change-Post: 1 %
FEV1-%Pred-Post: 49 %
FEV1-%Pred-Pre: 49 %
FEV1-Post: 1.43 L
FEV1-Pre: 1.41 L
FEV1FVC-%Change-Post: 8 %
FEV1FVC-%Pred-Pre: 91 %
FEV6-%Change-Post: -6 %
FEV6-%Pred-Post: 52 %
FEV6-%Pred-Pre: 56 %
FEV6-Post: 1.99 L
FEV6-Pre: 2.14 L
FEV6FVC-%Pred-Post: 107 %
FEV6FVC-%Pred-Pre: 107 %
FVC-%Change-Post: -6 %
FVC-%Pred-Post: 49 %
FVC-%Pred-Pre: 52 %
FVC-Post: 1.99 L
FVC-Pre: 2.14 L
Post FEV1/FVC ratio: 72 %
Post FEV6/FVC ratio: 100 %
Pre FEV1/FVC ratio: 66 %
Pre FEV6/FVC Ratio: 100 %

## 2017-03-31 LAB — BASIC METABOLIC PANEL
Anion gap: 4 — ABNORMAL LOW (ref 5–15)
BUN: 12 mg/dL (ref 6–20)
CO2: 29 mmol/L (ref 22–32)
Calcium: 9.2 mg/dL (ref 8.9–10.3)
Chloride: 107 mmol/L (ref 101–111)
Creatinine, Ser: 1.07 mg/dL (ref 0.61–1.24)
GFR calc Af Amer: >60 mL/min (ref >60)
GFR calc non Af Amer: >60 mL/min (ref >60)
Glucose, Bld: 88 mg/dL (ref 65–99)
Potassium: 4.7 mmol/L (ref 3.5–5.1)
Sodium: 140 mmol/L (ref 135–145)

## 2017-03-31 LAB — HEPARIN LEVEL (UNFRACTIONATED): Heparin Unfractionated: 0.62 IU/mL (ref 0.30–0.70)

## 2017-03-31 LAB — TYPE AND SCREEN
ABO/RH(D): A POS
Antibody Screen: NEGATIVE

## 2017-03-31 LAB — TROPONIN I: Troponin I: 0.29 ng/mL (ref <0.03)

## 2017-03-31 LAB — VAS US DOPPLER PRE CABG
LEFT ECA DIAS: -6 cm/s
LEFT VERTEBRAL DIAS: 9 cm/s
Left CCA dist dias: 9 cm/s
Left CCA dist sys: 52 cm/s
Left CCA prox dias: 15 cm/s
Left CCA prox sys: 96 cm/s
Left ICA dist dias: -11 cm/s
Left ICA dist sys: -42 cm/s
Left ICA prox dias: -12 cm/s
Left ICA prox sys: -46 cm/s
RIGHT ECA DIAS: -6 cm/s
Right CCA prox dias: 16 cm/s
Right CCA prox sys: 61 cm/s
Right cca dist sys: -63 cm/s

## 2017-03-31 LAB — CBC
HEMATOCRIT: 43.4 % (ref 39.0–52.0)
HEMOGLOBIN: 14 g/dL (ref 13.0–17.0)
MCH: 28.3 pg (ref 26.0–34.0)
MCHC: 32.3 g/dL (ref 30.0–36.0)
MCV: 87.7 fL (ref 78.0–100.0)
Platelets: 131 10*3/uL — ABNORMAL LOW (ref 150–400)
RBC: 4.95 MIL/uL (ref 4.22–5.81)
RDW: 14.9 % (ref 11.5–15.5)
WBC: 5.6 10*3/uL (ref 4.0–10.5)

## 2017-03-31 LAB — GLUCOSE, CAPILLARY: Glucose-Capillary: 91 mg/dL (ref 65–99)

## 2017-03-31 LAB — PROTIME-INR
INR: 1.36
Prothrombin Time: 16.9 seconds — ABNORMAL HIGH (ref 11.4–15.2)

## 2017-03-31 LAB — MAGNESIUM: Magnesium: 2 mg/dL (ref 1.7–2.4)

## 2017-03-31 SURGERY — RIGHT/LEFT HEART CATH AND CORONARY ANGIOGRAPHY
Anesthesia: LOCAL

## 2017-03-31 MED ORDER — SODIUM CHLORIDE 0.9 % IV SOLN: 250.0000 mL | INTRAVENOUS | Status: DC | PRN

## 2017-03-31 MED ORDER — FENTANYL CITRATE (PF) 100 MCG/2ML IJ SOLN
INTRAMUSCULAR | Status: DC | PRN
  Administered 2017-03-31: 25 ug via INTRAVENOUS

## 2017-03-31 MED ORDER — SODIUM CHLORIDE 0.9 % IV SOLN
INTRAVENOUS | Status: DC
  Filled 2017-03-31: qty 30

## 2017-03-31 MED ORDER — DEXMEDETOMIDINE HCL IN NACL 400 MCG/100ML IV SOLN
0.1000 ug/kg/h | INTRAVENOUS | Status: AC
  Administered 2017-04-01: .5 ug/kg/h via INTRAVENOUS
  Filled 2017-03-31: qty 100

## 2017-03-31 MED ORDER — IOPAMIDOL (ISOVUE-370) INJECTION 76%
INTRAVENOUS | Status: AC
  Filled 2017-03-31: qty 100

## 2017-03-31 MED ORDER — SODIUM CHLORIDE 0.9 % IV SOLN: INTRAVENOUS | Status: AC

## 2017-03-31 MED ORDER — SODIUM CHLORIDE 0.9 % WEIGHT BASED INFUSION: 3.0000 mL/kg/h | INTRAVENOUS | Status: DC

## 2017-03-31 MED ORDER — LIDOCAINE HCL (PF) 1 % IJ SOLN
INTRAMUSCULAR | Status: DC | PRN
  Administered 2017-03-31: 20 mL via SUBCUTANEOUS

## 2017-03-31 MED ORDER — DOPAMINE-DEXTROSE 3.2-5 MG/ML-% IV SOLN
0.0000 ug/kg/min | INTRAVENOUS | Status: AC
  Administered 2017-04-01: 3 ug/kg/min via INTRAVENOUS
  Filled 2017-03-31: qty 250

## 2017-03-31 MED ORDER — PLASMA-LYTE 148 IV SOLN
INTRAVENOUS | Status: AC
  Administered 2017-04-01: 500 mL
  Filled 2017-03-31: qty 2.5

## 2017-03-31 MED ORDER — CHLORHEXIDINE GLUCONATE CLOTH 2 % EX PADS
6.0000 | MEDICATED_PAD | Freq: Once | CUTANEOUS | Status: AC
  Administered 2017-03-31: 6 via TOPICAL

## 2017-03-31 MED ORDER — ONDANSETRON HCL 4 MG/2ML IJ SOLN: 4.0000 mg | Freq: Four times a day (QID) | INTRAMUSCULAR | Status: DC | PRN

## 2017-03-31 MED ORDER — METOPROLOL TARTRATE 12.5 MG HALF TABLET
12.5000 mg | ORAL_TABLET | Freq: Once | ORAL | Status: AC
  Administered 2017-04-01: 12.5 mg via ORAL
  Filled 2017-03-31: qty 1

## 2017-03-31 MED ORDER — TRANEXAMIC ACID 1000 MG/10ML IV SOLN
1.5000 mg/kg/h | INTRAVENOUS | Status: AC
  Administered 2017-04-01: 1.5 mg/kg/h via INTRAVENOUS
  Filled 2017-03-31: qty 25

## 2017-03-31 MED ORDER — POTASSIUM CHLORIDE 2 MEQ/ML IV SOLN
80.0000 meq | INTRAVENOUS | Status: DC
  Filled 2017-03-31: qty 40

## 2017-03-31 MED ORDER — TRANEXAMIC ACID (OHS) PUMP PRIME SOLUTION
2.0000 mg/kg | INTRAVENOUS | Status: DC
  Filled 2017-03-31: qty 1.64

## 2017-03-31 MED ORDER — SODIUM CHLORIDE 0.9 % IV SOLN
INTRAVENOUS | Status: AC
  Administered 2017-04-01: .8 [IU]/h via INTRAVENOUS
  Filled 2017-03-31: qty 2.5

## 2017-03-31 MED ORDER — FENTANYL CITRATE (PF) 100 MCG/2ML IJ SOLN
INTRAMUSCULAR | Status: AC
  Filled 2017-03-31: qty 2

## 2017-03-31 MED ORDER — SODIUM CHLORIDE 0.9% FLUSH: 3.0000 mL | INTRAVENOUS | Status: DC | PRN

## 2017-03-31 MED ORDER — OXYCODONE-ACETAMINOPHEN 5-325 MG PO TABS: 1.0000 | ORAL_TABLET | ORAL | Status: DC | PRN

## 2017-03-31 MED ORDER — SODIUM CHLORIDE 0.9 % IV SOLN
30.0000 ug/min | INTRAVENOUS | Status: AC
  Administered 2017-04-01: 10 ug/min via INTRAVENOUS
  Filled 2017-03-31: qty 2

## 2017-03-31 MED ORDER — MAGNESIUM SULFATE 50 % IJ SOLN
40.0000 meq | INTRAMUSCULAR | Status: DC
  Filled 2017-03-31: qty 10

## 2017-03-31 MED ORDER — SODIUM CHLORIDE 0.9 % WEIGHT BASED INFUSION: 1.0000 mL/kg/h | INTRAVENOUS | Status: DC

## 2017-03-31 MED ORDER — HEPARIN (PORCINE) IN NACL 2-0.9 UNIT/ML-% IJ SOLN
INTRAMUSCULAR | Status: DC | PRN
Start: 2017-03-31 — End: 2017-03-31
  Administered 2017-03-31: 1000 mL

## 2017-03-31 MED ORDER — IOPAMIDOL (ISOVUE-370) INJECTION 76%
INTRAVENOUS | Status: DC | PRN
  Administered 2017-03-31: 95 mL via INTRA_ARTERIAL

## 2017-03-31 MED ORDER — MIDAZOLAM HCL 2 MG/2ML IJ SOLN
INTRAMUSCULAR | Status: AC
  Filled 2017-03-31: qty 2

## 2017-03-31 MED ORDER — SODIUM CHLORIDE 0.9% FLUSH
3.0000 mL | Freq: Two times a day (BID) | INTRAVENOUS | Status: DC
  Administered 2017-03-31 (×2): 3 mL via INTRAVENOUS

## 2017-03-31 MED ORDER — CHLORHEXIDINE GLUCONATE 0.12 % MT SOLN
15.0000 mL | Freq: Once | OROMUCOSAL | Status: AC
  Administered 2017-04-01: 15 mL via OROMUCOSAL
  Filled 2017-03-31: qty 15

## 2017-03-31 MED ORDER — HEPARIN SODIUM (PORCINE) 1000 UNIT/ML IJ SOLN
INTRAMUSCULAR | Status: AC
  Filled 2017-03-31: qty 1

## 2017-03-31 MED ORDER — ALBUTEROL SULFATE (2.5 MG/3ML) 0.083% IN NEBU
2.5000 mg | INHALATION_SOLUTION | Freq: Once | RESPIRATORY_TRACT | Status: AC
  Administered 2017-03-31: 2.5 mg via RESPIRATORY_TRACT

## 2017-03-31 MED ORDER — DEXTROSE 5 % IV SOLN
1.5000 g | INTRAVENOUS | Status: AC
  Administered 2017-04-01: .75 g via INTRAVENOUS
  Administered 2017-04-01: 1.5 g via INTRAVENOUS
  Filled 2017-03-31: qty 1.5

## 2017-03-31 MED ORDER — MIDAZOLAM HCL 2 MG/2ML IJ SOLN
INTRAMUSCULAR | Status: DC | PRN
  Administered 2017-03-31: 1 mg via INTRAVENOUS

## 2017-03-31 MED ORDER — NITROGLYCERIN IN D5W 200-5 MCG/ML-% IV SOLN
2.0000 ug/min | INTRAVENOUS | Status: AC
  Administered 2017-04-01: 16.6 ug/min via INTRAVENOUS
  Filled 2017-03-31: qty 250

## 2017-03-31 MED ORDER — TEMAZEPAM 15 MG PO CAPS: 15.0000 mg | ORAL_CAPSULE | Freq: Once | ORAL | Status: DC | PRN

## 2017-03-31 MED ORDER — TRANEXAMIC ACID (OHS) BOLUS VIA INFUSION
15.0000 mg/kg | INTRAVENOUS | Status: AC
  Administered 2017-04-01: 1227 mg via INTRAVENOUS
  Filled 2017-03-31: qty 1227

## 2017-03-31 MED ORDER — DEXTROSE 5 % IV SOLN
750.0000 mg | INTRAVENOUS | Status: DC
  Filled 2017-03-31: qty 750

## 2017-03-31 MED ORDER — ACETAMINOPHEN 325 MG PO TABS: 650.0000 mg | ORAL_TABLET | ORAL | Status: DC | PRN

## 2017-03-31 MED ORDER — EPINEPHRINE PF 1 MG/ML IJ SOLN
0.0000 ug/min | INTRAVENOUS | Status: DC
  Filled 2017-03-31: qty 4

## 2017-03-31 MED ORDER — LIDOCAINE HCL (PF) 1 % IJ SOLN
INTRAMUSCULAR | Status: AC
  Filled 2017-03-31: qty 30

## 2017-03-31 MED ORDER — VANCOMYCIN HCL 10 G IV SOLR
1250.0000 mg | INTRAVENOUS | Status: AC
  Administered 2017-04-01: 1250 mg via INTRAVENOUS
  Filled 2017-03-31: qty 1250

## 2017-03-31 MED ORDER — BISACODYL 5 MG PO TBEC: 5.0000 mg | DELAYED_RELEASE_TABLET | Freq: Once | ORAL | Status: DC

## 2017-03-31 MED ORDER — SODIUM CHLORIDE 0.9% FLUSH: 3.0000 mL | Freq: Two times a day (BID) | INTRAVENOUS | Status: DC

## 2017-03-31 MED ORDER — HEPARIN SODIUM (PORCINE) 1000 UNIT/ML IJ SOLN
INTRAMUSCULAR | Status: DC | PRN
  Administered 2017-03-31: 2000 [IU] via INTRAVENOUS

## 2017-03-31 MED ORDER — HEPARIN (PORCINE) IN NACL 2-0.9 UNIT/ML-% IJ SOLN
INTRAMUSCULAR | Status: AC
Start: 2017-03-31 — End: 2017-03-31
  Filled 2017-03-31: qty 1000

## 2017-03-31 MED ORDER — CHLORHEXIDINE GLUCONATE CLOTH 2 % EX PADS
6.0000 | MEDICATED_PAD | Freq: Once | CUTANEOUS | Status: AC
  Administered 2017-04-01: 6 via TOPICAL

## 2017-03-31 SURGICAL SUPPLY — 15 items
CATH INFINITI 5FR JL4 (CATHETERS) ×1 IMPLANT
CATH INFINITI 5FR MPB2 (CATHETERS) ×1 IMPLANT
CATH INFINITI JR4 5F (CATHETERS) ×1 IMPLANT
CATH SWAN GANZ 7F STRAIGHT (CATHETERS) ×1 IMPLANT
COVER PRB 48X5XTLSCP FOLD TPE (BAG) IMPLANT
COVER PROBE 5X48 (BAG) ×2
GUIDEWIRE 3MM J TIP .035 145 (WIRE) ×1 IMPLANT
KIT HEART LEFT (KITS) ×2 IMPLANT
PACK CARDIAC CATHETERIZATION (CUSTOM PROCEDURE TRAY) ×2 IMPLANT
SHEATH PINNACLE 5F 10CM (SHEATH) ×1 IMPLANT
SHEATH PINNACLE 7F 10CM (SHEATH) ×1 IMPLANT
TRANSDUCER W/STOPCOCK (MISCELLANEOUS) ×2 IMPLANT
TUBING CIL FLEX 10 FLL-RA (TUBING) ×2 IMPLANT
WIRE EMERALD 3MM-J .025X260CM (WIRE) ×1 IMPLANT
WIRE EMERALD ST .035X260CM (WIRE) ×1 IMPLANT

## 2017-03-31 NOTE — H&P (View-Only) (Signed)
Progress Note  Patient Name: Steven Everett Date of Encounter: 03/31/2017  Primary Cardiologist: Dr. Stanford Breed  Subjective   Pt is feeling well. No chest pain since admission. No shortness of breath.   Inpatient Medications    Scheduled Meds: . aspirin EC  81 mg Oral Daily  . carvedilol  6.25 mg Oral BID WC  . levothyroxine  112 mcg Oral QAC breakfast  . lisinopril  5 mg Oral Daily  . pravastatin  40 mg Oral Daily   Continuous Infusions: . heparin 1,100 Units/hr (03/30/17 2116)   PRN Meds: acetaminophen, nitroGLYCERIN, ondansetron (ZOFRAN) IV   Vital Signs    Vitals:   03/30/17 2330 03/31/17 0000 03/31/17 0102 03/31/17 0400  BP: 124/68 126/67 125/63 131/72  Pulse: 79 78 77 74  Resp: '14 15 18 20  '$ Temp:   98.2 F (36.8 C) 97.5 F (36.4 C)  TempSrc:   Oral Oral  SpO2: 100% 99% 100% 100%  Weight:   180 lb 5.4 oz (81.8 kg)   Height:        Intake/Output Summary (Last 24 hours) at 03/31/17 0739 Last data filed at 03/31/17 0530  Gross per 24 hour  Intake            90.57 ml  Output              400 ml  Net          -309.43 ml   Filed Weights   03/30/17 1952 03/31/17 0102  Weight: 190 lb (86.2 kg) 180 lb 5.4 oz (81.8 kg)    Telemetry    1st degree AVB in the 70's with rare pacing - Personally Reviewed  ECG    03/30/2017- Sinus rhythm with 1st degree AVB, RBBB and LAFB, unchanged from 01/2017 - Personally Reviewed Serial EKG's unchanged  Physical Exam   GEN: No acute distress.   Neck: No JVD Cardiac: RRR, 3/6 systolic murmur L sternal border  Respiratory: Clear to auscultation bilaterally. GI: Soft, nontender, non-distended  MS: No edema; No deformity. Neuro:  Nonfocal  Psych: Normal affect   Labs    Chemistry Recent Labs Lab 03/30/17 2005 03/31/17 0448  NA 139 140  K 3.8 4.7  CL 105 107  CO2 26 29  GLUCOSE 102* 88  BUN 15 12  CREATININE 1.04 1.07  CALCIUM 8.9 9.2  GFRNONAA >60 >60  GFRAA >60 >60  ANIONGAP 8 4*     Hematology Recent  Labs Lab 03/30/17 2005 03/31/17 0448  WBC 4.8 5.6  RBC 4.53 4.95  HGB 12.8* 14.0  HCT 39.6 43.4  MCV 87.4 87.7  MCH 28.3 28.3  MCHC 32.3 32.3  RDW 14.8 14.9  PLT 139* 131*    Cardiac Enzymes Recent Labs Lab 03/31/17 0448  TROPONINI 0.29*    Recent Labs Lab 03/30/17 2016  TROPIPOC 0.31*     BNPNo results for input(s): BNP, PROBNP in the last 168 hours.   DDimer No results for input(s): DDIMER in the last 168 hours.   Radiology    Dg Chest 2 View  Result Date: 03/30/2017 CLINICAL DATA:  Waxing and waning chest pain for several days worse this morning. History of right lung cancer with resection of middle and lower lobes. EXAM: CHEST  2 VIEW COMPARISON:  None. FINDINGS: Postop volume loss of the right lung post resection of right middle and lower lobes with elevated right hemidiaphragm and colonic interposition noted over the liver shadow. Aortic atherosclerosis with uncoiled appearance of  the thoracic aorta is stable right atrial pacer and right ventricular defibrillator leads are stable with ICD device projecting over the left hemithorax. The aerated lungs are stable without acute pneumonic consolidation, effusion or CHF. 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 IMPRESSION: Postop volume loss of the right lung with elevated right hemidiaphragm and colonic interposition over the liver shadow. No acute pulmonary disease. Electronically Signed   By: Ashley Royalty M.D.   On: 03/30/2017 20:49    Cardiac Studies   Echo 01/15/17 Study Conclusions  - Left ventricle: The cavity size was normal. Wall thickness was   normal. Systolic function was normal. The estimated ejection   fraction was in the range of 60% to 65%. Wall motion was normal;   there were no regional wall motion abnormalities. Features are   consistent with a pseudonormal left ventricular filling pattern,   with concomitant abnormal  relaxation and increased filling   pressure (grade 2 diastolic dysfunction). - Aortic valve: Moderately to severely calcified annulus.   Moderately thickened, moderately calcified leaflets. There was   mild stenosis. Mean gradient (S): 16 mm Hg. Peak gradient (S): 31   mm Hg.   Patient Profile     82 y.o. male 82 y.o. male with history of LV systolic dysfunction (40-10% 01/2016) and subsequent recovery, CAD (40% LM, 80% mid LAD, 70% RCA, 03/2013), moderate AS, pAF s/p Watchman 01/2016, ICD 08/2013,  cirrhosis, who presents with intermittent CP x 1 week.  Over the past week, pt has noticed episodic CP, initially with exertion (I.e. While riding the elliptical) and progressing to episodes of rest pain by the afternoon of presentation.  Assessment & Plan    Unstable angina -Pt with progressively more frequent over a week exertional chest pain, starting in left arm and radiating to left chest lasting 2-3 minutes, occurring most often with exercise and resolving with rest, occ occurs at rest -Had severely calcified CAD involving LAD and RCA, mild AS by cath 03/2013 with concern that he will likely need CABG with AVR if he becomes symptomatic -Mild elevation in troponin, 0.31 (POC), 0.29 -IV heparin infusing -Currently CAD treated with aspirin, BB, pravastatin -Pt is high risk for progressed CAD. Plan for cardiac cath today for definitive cardiac evaluation.   History of systolic dysfunction -EF 27-25% 01/2016, most recent EF 60-65% in 01/2017. -Has dual chamber [employer] ICD with optivol in place.  -On ACE-I, BB  Paroxysmal Atrial fibrillation -S/P Watchman device, recent ICD interogation on 03/18/17 showed 9.4% AT/F burden. Baseline rhythm is 1st degree AVB. -current treatment with BB and aspirin  Hyperlipidemia -Unable to tolerate lipitor in the past. Now treated with pravastatin -Last lipid panel on 10/21/2016: TC 114, LDL 53, HDL 49. At goal on statin.  Aortic stenosis -Moderately to  severely calcified annulus. Moderately thickened, moderately calcified leaflets. There was mild stenosis -Mean gradient 16 mm Hg,  -No symptoms or DOE, lightheadedness, pre-syncope or syncope.   Signed, Daune Perch, NP  03/31/2017, 7:39 AM     I have seen and examined the patient along with Daune Perch, NP .  I have reviewed the chart, notes and new data.  I agree with NP's note.  Key new complaints: currently asymptomatic, symptoms strongly suggestive of unstable angina Key examination changes: early peaking AS murmur, normal carotid and distal pulses Key new findings / data: mildly abnormal enzymes, trifascicular block  PLAN: Recommend R and L heart cath for CAD  and AS. Likely will have indication for surgical revascularization. Conflicting data on echo re: severity of AS, but clinically he has mild to moderate AS. If the valve appears heavily diseased on echo (will review) should have AVR at the time of CABG. This procedure has been fully reviewed with the patient and written informed consent has been obtained.   Sanda Klein, MD, [place] [phone] 03/31/2017, 8:32 AM

## 2017-03-31 NOTE — Progress Notes (Signed)
Site area: rt groin arterial and venous Site Prior to Removal:  Level 0 Pressure Applied For:20 minutes Manual:   yes Patient Status During Pull:  awake Post Pull Site:  Level 0 Post Pull Instructions Given: yes  Post Pull Pulses Present: doppler rt DP and PT Dressing Applied: yes  Bedrest begins @ 11:10 Comments:

## 2017-03-31 NOTE — Consult Note (Addendum)
[address]       [place] [address]             [phone]        Steven Everett [place] Medical Record #[id number] Date of Birth: 19-Dec-1934  Referring: Linard Millers Primary Care: Beatrice Lecher, MD  Chief Complaint:    Chief Complaint  Patient presents with  . Chest Pain   History of Present Illness:      Steven Everett is an 7 white male with extensive medical history.  He was diagnosed with Lung cancer in 1998.  This was treated with Right middle lobe wedge resection, right lower lobectomy, and 30 treatments of chemotherapy and radiation. He has been followed with yearly CXR since that time that have been WNL.  His coronary history looks to date back to 2014 at which time he underwent ICD placement for LV dysfunction.  He has a history of PAF and is S/P Watchman placement from 01/2016.  There is also documented history of mild Aortic Stenosis with calcified annulus.  There was no aortic insufficiency per TTE from 01/2017.  He also has a history of HTN, Hyperlipidemia, GERD/Barrett's esophagus, DM controlled with diet, and Hypothyroidism.  The patient remains physically active.  He presented to the ED on 03/30/2017 with a complaint of chest pain.  He developed chest pain a week prior while working out on the eliptical.  This resolved with rest, but as the week progressed this started to occur at rest.  He described the pain as pressure with radiation to his left arm.  He denies N/V, diaphoresis, and dyspnea.  Workup in the ED showed the patient suffered a NSTEMI.  He was placed on Heparin and admitted for further care.  Currently the patient is chest pain free.  He is a former smoker of 2 ppd for 36 years., quit in 1990   Current Activity/ Functional Status: Patient is independent with mobility/ambulation, transfers, ADL's, IADL's.   Zubrod Score: At the time of surgery this patient's most appropriate activity status/level should be described as: '[]'$     0    Normal  activity, no symptoms '[x]'$     1    Restricted in physical strenuous activity but ambulatory, able to do out light work '[]'$     2    Ambulatory and capable of self care, unable to do work activities, up and about                 more than 50%  Of the time                            '[]'$     3    Only limited self care, in bed greater than 50% of waking hours '[]'$     4    Completely disabled, no self care, confined to bed or chair '[]'$     5    Moribund  Past Medical History:  Diagnosis Date  . Aortic stenosis   . Barrett's esophagus   . Cancer of right lung ([facility])    lower lobes  . Chronic systolic dysfunction of left ventricle   . Cirrhosis of liver ([facility])    w/o mention of alcohol  . Diverticulosis of colon   . GERD (gastroesophageal reflux disease)   . Hemorrhoids, internal   . Hyperlipidemia   . Hypertension   . Hypothyroidism   . Ischemic cardiomyopathy   .  Paroxysmal atrial fibrillation ([facility]) 12/14/2013   a. s/p Watchman   . Rheumatic fever   . Seborrheic dermatitis   . Sinus node dysfunction ([facility])   . Type II diabetes mellitus ([facility])    "weighed over 300#; lost weight; lost diabetes" (01/31/2016)    Past Surgical History:  Procedure Laterality Date  . ANKLE FRACTURE SURGERY Right 1940s  . APPENDECTOMY  1940s  . CARDIAC CATHETERIZATION  "years ago"  . CARDIAC DEFIBRILLATOR PLACEMENT  08/12/13   Medtronic Evera XT DR implanted by [name] for primary prevention of sudden death  . CARDIAC PACEMAKER PLACEMENT    . CATARACT EXTRACTION, BILATERAL Bilateral 8-11   Dr Ayesha Rumpf  . COLONOSCOPY    . FRACTURE SURGERY    . IMPLANTABLE CARDIOVERTER DEFIBRILLATOR IMPLANT N/A 08/12/2013   Procedure: IMPLANTABLE CARDIOVERTER DEFIBRILLATOR IMPLANT;  Surgeon: Coralyn Mark, MD;  Location: [facility] CATH LAB;  Service: Cardiovascular;  Laterality: N/A;  . JOINT REPLACEMENT    . LEFT ATRIAL APPENDAGE OCCLUSION N/A 01/31/2016   Procedure: LEFT ATRIAL APPENDAGE OCCLUSION;  Surgeon: Thompson Grayer, MD;  Location: [facility] CV LAB;  Service: Cardiovascular;  Laterality: N/A;  . LUMBAR DISC SURGERY  X 2   "herniated discs"  . LUNG REMOVAL, PARTIAL Right 1998   for lung cancer by Dr Rollene Fare; middle and lower lobes  . RIGHT/LEFT HEART CATH AND CORONARY ANGIOGRAPHY N/A 03/31/2017   Procedure: Right/Left Heart Cath and Coronary Angiography;  Surgeon: Belva Crome, MD;  Location: [facility];  Service: Cardiovascular;  Laterality: N/A;  . TEE WITHOUT CARDIOVERSION N/A 02/14/2013   Procedure: TRANSESOPHAGEAL ECHOCARDIOGRAM (TEE);  Surgeon: Lelon Perla, MD;  Location: [facility] ENDOSCOPY;  Service: Cardiovascular;  Laterality: N/A;  . TEE WITHOUT CARDIOVERSION N/A 01/09/2016   Procedure: TRANSESOPHAGEAL ECHOCARDIOGRAM (TEE);  Surgeon: Josue Hector, MD;  Location: [facility] ENDOSCOPY;  Service: Cardiovascular;  Laterality: N/A;  . TEE WITHOUT CARDIOVERSION N/A 03/20/2016   Procedure: TRANSESOPHAGEAL ECHOCARDIOGRAM (TEE);  Surgeon: Lelon Perla, MD;  Location: [facility] ENDOSCOPY;  Service: Cardiovascular;  Laterality: N/A;  . TOTAL HIP ARTHROPLASTY Right    Dr Alvan Dame    History  Smoking Status  . Former Smoker  . Packs/day: 2.00  . Years: 36.00  . Types: Cigarettes  . Quit date: 02/14/1989  Smokeless Tobacco  . Never Used    History  Alcohol Use No    Social History   Social History  . Marital status: Widowed    Spouse name: Hoyle Sauer  . Number of children: 1  . Years of education: N/A   Occupational History  .  Retired   Social History Main Topics  . Smoking status: Former Smoker    Packs/day: 2.00    Years: 36.00    Types: Cigarettes    Quit date: 02/14/1989  . Smokeless tobacco: Never Used  . Alcohol use No  . Drug use: No  . Sexual activity: Not Currently   Other Topics Concern  . Not on file   Social History Narrative  . No narrative on file    Allergies  Allergen Reactions  . Atorvastatin Other (See Comments)    joint aches    Current Facility-Administered Medications    Medication Dose Route Frequency Provider Last Rate Last Dose  . 0.9 %  sodium chloride infusion  250 mL Intravenous PRN Belva Crome, MD      . acetaminophen (TYLENOL) tablet 650 mg  650 mg Oral Q4H PRN Belva Crome, MD      .  aspirin EC tablet 81 mg  81 mg Oral Daily Doylene Canning, MD   81 mg at 03/31/17 0838  . carvedilol (COREG) tablet 6.25 mg  6.25 mg Oral BID WC Doylene Canning, MD   6.25 mg at 03/31/17 1720  . levothyroxine (SYNTHROID, LEVOTHROID) tablet 112 mcg  112 mcg Oral QAC breakfast Jaidip Chakravartti, MD      . lisinopril (PRINIVIL,ZESTRIL) tablet 5 mg  5 mg Oral Daily Jaidip Chakravartti, MD      . nitroGLYCERIN (NITROSTAT) SL tablet 0.4 mg  0.4 mg Sublingual Q5 Min x 3 PRN Doylene Canning, MD      . ondansetron (ZOFRAN) injection 4 mg  4 mg Intravenous Q6H PRN Belva Crome, MD      . oxyCODONE-acetaminophen (PERCOCET/ROXICET) 5-325 MG per tablet 1-2 tablet  1-2 tablet Oral Q4H PRN Belva Crome, MD      . pravastatin (PRAVACHOL) tablet 40 mg  40 mg Oral Daily Jaidip Chakravartti, MD      . sodium chloride flush (NS) 0.9 % injection 3 mL  3 mL Intravenous Q12H Belva Crome, MD   3 mL at 03/31/17 1445  . sodium chloride flush (NS) 0.9 % injection 3 mL  3 mL Intravenous PRN Belva Crome, MD        Prescriptions Prior to Admission  Medication Sig Dispense Refill Last Dose  . alfuzosin (UROXATRAL) 10 MG 24 hr tablet Take 10 mg by mouth daily with breakfast.   03/29/2017  . aspirin 81 MG tablet TAKE 1 TABLET  BY MOUTH IN THE MORNING AND 1 TABLET BY MOUTH AT NIGHT.   03/30/2017 at am  . carvedilol (COREG) 6.25 MG tablet Take 1 tablet (6.25 mg total) by mouth 2 (two) times daily with a meal. 180 tablet 3 03/30/2017 at 0530  . finasteride (PROSCAR) 5 MG tablet Take 5 mg by mouth daily.   03/29/2017  . levothyroxine (SYNTHROID, LEVOTHROID) 112 MCG tablet Take 1 tablet (112 mcg total) by mouth daily before breakfast. 90 tablet 1 03/30/2017 at Unknown time  . lisinopril  (PRINIVIL,ZESTRIL) 5 MG tablet Take 1 tablet (5 mg total) by mouth daily. 30 tablet 6 03/30/2017 at Unknown time  . pravastatin (PRAVACHOL) 40 MG tablet Take 1 tablet (40 mg total) by mouth daily. 30 tablet 10 03/30/2017 at Unknown time    Family History  Problem Relation Age of Onset  . Cancer Mother     breast  . Diabetes Mother   . Cancer Father     Lung  . Diabetes     Review of Systems:  Constitutional: negative for chills, fevers, night sweats and Positive for weight loss, patient used to weigh >300 lbs Eyes: negative Ears, nose, mouth, throat, and face: negative Respiratory: negative for dyspnea on exertion and hemoptysis Cardiovascular: positive for chest pressure/discomfort, exertional chest pressure/discomfort and H/O PAF, S/P Watchman Gastrointestinal: negative Musculoskeletal:positive for H/O Right hip replacement, right ankle fracture Neurological: negative Endocrine: positive for diet controlled DM     Cardiac Review of Systems: Y or N  Chest Pain [  y  ]  Resting SOB [ n  ] Exertional SOB  [ n ]  Orthopnea [  ]   Pedal Edema [ n  ]    Palpitations [  ] Syncope  [  ]   Presyncope [   ]  General Review of Systems: [Y] = yes [  ]=no Constitional: recent weight change [ n ]; anorexia [  ]; fatigue [  ];  nausea [  ]; night sweats [n  ]; fever [  ]; or chills [  ]                                                               Dental: poor dentition[  ]; Last Dentist visit: last month, has dentures  Eye : blurred vision [  ]; diplopia [   ]; vision changes [  ];  Amaurosis fugax[  ]; Resp: cough [  ];  wheezing[  ];  hemoptysis[N  ]; shortness of breath[N  ]; paroxysmal nocturnal dyspnea[  ]; dyspnea on exertion[N  ]; or orthopnea[  ];  GI:  gallstones[  ], vomiting[  ];  dysphagia[N  ]; melena[  ];  hematochezia [  ]; heartburn[  ];   Hx of  Colonoscopy[  ]; GU: kidney stones [  ]; hematuria[  ];   dysuria [  ];  nocturia[  ];  history of     obstruction [  ]; urinary frequency  [  ]             Skin: rash, swelling[ N ];, hair loss[  ];  peripheral edema[N  ];  or itching[  ]; Musculosketetal: myalgias[  ];  joint swelling[  ];  joint erythema[  ];  joint pain[  ];  back pain[  ];  Heme/Lymph: bruising[  ];  bleeding[  ];  anemia[  ];  Neuro: TIA[  ];  headaches[ N ];  stroke[  ];  vertigo[  ];  seizures[N  ];   paresthesias[  ];  difficulty walking[  ];  Psych:depression[  ]; anxiety[  ];  Endocrine: diabetes[ Y ];  thyroid dysfunction[Y  ];  Immunizations: Flu [  ]; Pneumococcal[  ];  Other:  Physical Exam: BP (!) 109/92 (BP Location: Left Arm)   Pulse 83   Temp 98.3 F (36.8 C) (Oral)   Resp 16   Ht '6\' 1"'$  (1.854 m)   Wt 180 lb 5.4 oz (81.8 kg)   SpO2 100%   BMI 23.79 kg/m   General appearance: alert, cooperative and no distress Head: Normocephalic, without obvious abnormality, atraumatic Neck: no adenopathy, no carotid bruit, no JVD, supple, symmetrical, trachea midline and thyroid not enlarged, symmetric, no tenderness/mass/nodules Resp: clear to auscultation bilaterally and Right Thoractomy, chest tube scars Cardio: regular rate and rhythm GI: soft, non-tender; bowel sounds normal; no masses,  no organomegaly Extremities: extremities normal, atraumatic, no cyanosis or edema Neurologic: Grossly normal  Diagnostic Studies & Laboratory data:     Recent Radiology Findings:   Dg Chest 2 View  Result Date: 03/30/2017 CLINICAL DATA:  Waxing and waning chest pain for several days worse this morning. History of right lung cancer with resection of middle and lower lobes. EXAM: CHEST  2 VIEW COMPARISON:  None. FINDINGS: Postop volume loss of the right lung post resection of right middle and lower lobes with elevated right hemidiaphragm and colonic interposition noted over the liver shadow. Aortic atherosclerosis with uncoiled appearance of the thoracic aorta is stable right atrial pacer and right ventricular defibrillator leads are stable with ICD device  projecting over the left hemithorax. The aerated lungs are stable without acute pneumonic consolidation, effusion or CHF. There is osteoarthritis of the AC and glenohumeral  joints bilaterally with degenerative change along the dorsal spine. IMPRESSION: Postop volume loss of the right lung with elevated right hemidiaphragm and colonic interposition over the liver shadow. No acute pulmonary disease. Electronically Signed   By: Ashley Royalty M.D.   On: 03/30/2017 20:49   Ct Chest Wo Contrast  Result Date: 03/31/2017 CLINICAL DATA:  Lung cancer, chest pain worsening over the past week. EXAM: CT CHEST WITHOUT CONTRAST TECHNIQUE: Multidetector CT imaging of the chest was performed following the standard protocol without IV contrast. COMPARISON:  CXR 03/30/2017 and 01/16/2017. Chest CT report from 10/02/2003. FINDINGS: Cardiovascular: None normal size cardiac chambers with coronary arteriosclerosis. Right atrial and right ventricular leads are present with left anterior chest wall ICD device. No pericardial effusion. Aortic atherosclerosis without aneurysm. Mediastinum/Nodes: Paraspinal right infrahilar post surgical and probable post radiative scarring is noted. The Lungs/Pleura: The patient is reportedly status post right middle and lower lobectomies with resultant elevation of right hemidiaphragm. Patchy right lower lobe airspace opacities are seen posteriorly. Left lung is clear. No definite masslike opacities within the aerated lungs. Upper Abdomen: Colonic interposition is seen over the liver. Cholelithiasis without complication is also noted. No adrenal mass. Bilateral nephrolithiasis without obstructive uropathy. Small 2.4 cm parapelvic cyst is seen on the right. The visualized spleen and pancreas are unremarkable. Musculoskeletal: No chest wall mass or suspicious bone lesions identified. IMPRESSION: 1. Postop volume loss of the right lung status post right middle and lower lobectomy. 2. Patchy airspace opacity  noted in the right lung base concerning for pneumonia. 3. Coronary arteriosclerosis and aortic atherosclerosis. No pericardial effusion. ICD device in place. 4. Cholelithiasis and nonobstructing nephrolithiasis. Right parapelvic renal cyst measuring 2.4 cm. Electronically Signed   By: Ashley Royalty M.D.   On: 03/31/2017 17:14      I have independently reviewed the above  cath films and reviewed the findings with the  patient . No signs or symptoms of pneumonia   Recent Lab Findings: Lab Results  Component Value Date   WBC 5.6 03/31/2017   HGB 14.0 03/31/2017   HCT 43.4 03/31/2017   PLT 131 (L) 03/31/2017   GLUCOSE 88 03/31/2017   CHOL 114 10/21/2016   TRIG 58 10/21/2016   HDL 49 10/21/2016   LDLDIRECT 57 04/23/2011   LDLCALC 53 10/21/2016   ALT 23 10/21/2016   AST 26 10/21/2016   NA 140 03/31/2017   K 4.7 03/31/2017   CL 107 03/31/2017   CREATININE 1.07 03/31/2017   BUN 12 03/31/2017   CO2 29 03/31/2017   TSH 1.56 01/09/2017   INR 1.36 03/31/2017   HGBA1C 5.6 10/21/2016  PFT's  Interpretation: The FVC, FEV1, FEV1/FVC ratio and FEF25-75% are reduced indicating airway obstruction. The slow vital capacity is reduced. Following administration of bronchodilators, there is no significant response. Conclusions: Pulmonary Function Diagnosis: Severe Obstructive Airways Disease  CARDIAC CATHETERIZATION:   Severe calcific coronary disease.  95% mid RCA stenosis. The RCA arises with a steep shepherd's crook and is heavily calcified from the ostium to the distal vessel. The PDA contains 60-70% ostial narrowing. The right coronary is a very dominant vessel that supplies around the left ventricular apex.  Widely patent small circumflex  Severely diseased very and left anterior descending with a large branching diagonal that contains segmental calcified 80-85% stenosis. The proximal left anterior descending contains eccentric 70% stenosis. The LAD proper is relatively small in caliber and  supplies the proximal to mid anterior wall.  Left main contains 30-40% ostial narrowing.  Calcified aortic valve but without significant peak to peak gradient.  Severe left ventricular systolic dysfunction with EF 30%. Normal filling pressures.  Assessment / Plan:      1. CV- H/O LV Dysfunction S/P ICD 2004, H/O PAF S/P Watchman 01/2016-presents now with chest pain, ruled in for NSTEMI- needs CABG, questionable Aortic Stenosis-- will need TEE to formally evaluate 2. Pulm- H/O Lung Cancer, S/P RML Wedge, RLL Lobectomy 1998, also treated with chemo/radiation- will need CT scan of the chest to ensure no evidence of recurrence 3. DM- significant weight loss, used to weigh over 300 lbs, currently at 180 and sugars are controlled with diet control... Last A1c was 5.6 from November 4. HTN 5. Hyperlipidemia- intolerant of Lipitor, currently on Pravastatin 6. CT of chest no evidence of recurrent Lung Ca 7. Severe  COPD by PFTS  I have discussed case with Dr Tamala Julian, the lesion in the right with ca in vessel and bend makes angioplasty/stent higher risk . We discussed leaving aortic valve , and if in future becomes issue consider TVAR.   I have recommended with  the patient proceeding with cabg. For relief of symptoms and to prevent MI. The goals risks and alternatives of the planned surgical procedure Procedure(s): CABG have been discussed with the patient in detail. The risks of the procedure including death, infection, stroke, myocardial infarction, bleeding, blood transfusion have all been discussed specifically.  I have quoted Steven Everett a 4 % of perioperative mortality and a complication rate as high as 40 %. The patient's questions have been answered.Steven Everett is willing  to proceed with the planned procedure.  Grace Isaac MD      [address] [place] [address] Office [phone]   [place]

## 2017-03-31 NOTE — Progress Notes (Signed)
Pre-op Cardiac Surgery  Carotid Findings:  Findings suggest 1-39% internal carotid artery stenosis bilaterally. Vertebral arteries are patent with antegrade flow.  Upper Extremity Right Left  Brachial Pressures 104-Triphasic 121-Triphasic  Radial Waveforms Triphasic Biphasic  Ulnar Waveforms Triphasic Triphasic  Palmar Arch (Allen's Test) Signal obliterates with radial compression, is unaffected with ulnar compression. Signal decreases >50% with radial and ulnar compression.   Findings:      Lower  Extremity Right Left  Dorsalis Pedis 154-Biphasic 150-Triphasic  Anterior Tibial    Posterior Tibial 246-Triphasic 155-Triphasic  Ankle/Brachial Indices 2.03 1.28    Findings:   ABIs are elevated bilaterally, suggestive of possible medial calcification.  03/31/2017 4:59 PM Maudry Mayhew, BS, RVT, RDCS, RDMS

## 2017-03-31 NOTE — Telephone Encounter (Signed)
noted 

## 2017-03-31 NOTE — Interval H&P Note (Signed)
Cath Lab Visit (complete for each Cath Lab visit)  Clinical Evaluation Leading to the Procedure:   ACS: Yes.    Non-ACS:    Anginal Classification: CCS Everett  Anti-ischemic medical therapy: Maximal Therapy (2 or more classes of medications)  Non-Invasive Test Results: No non-invasive testing performed  Prior CABG: No previous CABG      History and Physical Interval Note:  03/31/2017 9:24 AM  Steven Everett  has presented today for surgery, with the diagnosis of nstemi  The various methods of treatment have been discussed with the patient and family. After consideration of risks, benefits and other options for treatment, the patient has consented to  Procedure(s): Right/Left Heart Cath and Coronary Angiography (N/A) as a surgical intervention .  The patient's history has been reviewed, patient examined, no change in status, stable for surgery.  I have reviewed the patient's chart and labs.  Questions were answered to the patient's satisfaction.     Steven Everett

## 2017-03-31 NOTE — Progress Notes (Signed)
ANTICOAGULATION CONSULT NOTE - Follow Up Consult  Pharmacy Consult for Heparin  Indication: chest pain/ACS  Allergies  Allergen Reactions  . Atorvastatin Other (See Comments)    joint aches    Patient Measurements: Height: '6\' 1"'$  (185.4 cm) Weight: 180 lb 5.4 oz (81.8 kg) IBW/kg (Calculated) : 79.9  Vital Signs: Temp: 97.5 F (36.4 C) (05/01 0400) Temp Source: Oral (05/01 0400) BP: 131/72 (05/01 0400) Pulse Rate: 74 (05/01 0400)  Labs:  Recent Labs  03/30/17 2005 03/31/17 0448  HGB 12.8* 14.0  HCT 39.6 43.4  PLT 139* 131*  HEPARINUNFRC  --  0.62  CREATININE 1.04  --     Estimated Creatinine Clearance: 64 mL/min (by C-G formula based on SCr of 1.04 mg/dL).    Assessment: Heparin for CP, ?cath, heparin level therapeutic  Goal of Therapy:  Heparin level 0.3-0.7 units/ml Monitor platelets by anticoagulation protocol: Yes   Plan:  -Cont heparin 1100 units/hr -1200 HL  Madgie Dhaliwal 03/31/2017,5:41 AM

## 2017-03-31 NOTE — Progress Notes (Signed)
Progress Note  Patient Name: Steven Everett Date of Encounter: 03/31/2017  Primary Cardiologist: Dr. Stanford Breed  Subjective   Pt is feeling well. No chest pain since admission. No shortness of breath.   Inpatient Medications    Scheduled Meds: . aspirin EC  81 mg Oral Daily  . carvedilol  6.25 mg Oral BID WC  . levothyroxine  112 mcg Oral QAC breakfast  . lisinopril  5 mg Oral Daily  . pravastatin  40 mg Oral Daily   Continuous Infusions: . heparin 1,100 Units/hr (03/30/17 2116)   PRN Meds: acetaminophen, nitroGLYCERIN, ondansetron (ZOFRAN) IV   Vital Signs    Vitals:   03/30/17 2330 03/31/17 0000 03/31/17 0102 03/31/17 0400  BP: 124/68 126/67 125/63 131/72  Pulse: 79 78 77 74  Resp: '14 15 18 20  '$ Temp:   98.2 F (36.8 C) 97.5 F (36.4 C)  TempSrc:   Oral Oral  SpO2: 100% 99% 100% 100%  Weight:   180 lb 5.4 oz (81.8 kg)   Height:        Intake/Output Summary (Last 24 hours) at 03/31/17 0739 Last data filed at 03/31/17 0530  Gross per 24 hour  Intake            90.57 ml  Output              400 ml  Net          -309.43 ml   Filed Weights   03/30/17 1952 03/31/17 0102  Weight: 190 lb (86.2 kg) 180 lb 5.4 oz (81.8 kg)    Telemetry    1st degree AVB in the 70's with rare pacing - Personally Reviewed  ECG    03/30/2017- Sinus rhythm with 1st degree AVB, RBBB and LAFB, unchanged from 01/2017 - Personally Reviewed Serial EKG's unchanged  Physical Exam   GEN: No acute distress.   Neck: No JVD Cardiac: RRR, 3/6 systolic murmur L sternal border  Respiratory: Clear to auscultation bilaterally. GI: Soft, nontender, non-distended  MS: No edema; No deformity. Neuro:  Nonfocal  Psych: Normal affect   Labs    Chemistry Recent Labs Lab 03/30/17 2005 03/31/17 0448  NA 139 140  K 3.8 4.7  CL 105 107  CO2 26 29  GLUCOSE 102* 88  BUN 15 12  CREATININE 1.04 1.07  CALCIUM 8.9 9.2  GFRNONAA >60 >60  GFRAA >60 >60  ANIONGAP 8 4*     Hematology Recent  Labs Lab 03/30/17 2005 03/31/17 0448  WBC 4.8 5.6  RBC 4.53 4.95  HGB 12.8* 14.0  HCT 39.6 43.4  MCV 87.4 87.7  MCH 28.3 28.3  MCHC 32.3 32.3  RDW 14.8 14.9  PLT 139* 131*    Cardiac Enzymes Recent Labs Lab 03/31/17 0448  TROPONINI 0.29*    Recent Labs Lab 03/30/17 2016  TROPIPOC 0.31*     BNPNo results for input(s): BNP, PROBNP in the last 168 hours.   DDimer No results for input(s): DDIMER in the last 168 hours.   Radiology    Dg Chest 2 View  Result Date: 03/30/2017 CLINICAL DATA:  Waxing and waning chest pain for several days worse this morning. History of right lung cancer with resection of middle and lower lobes. EXAM: CHEST  2 VIEW COMPARISON:  None. FINDINGS: Postop volume loss of the right lung post resection of right middle and lower lobes with elevated right hemidiaphragm and colonic interposition noted over the liver shadow. Aortic atherosclerosis with uncoiled appearance of  the thoracic aorta is stable right atrial pacer and right ventricular defibrillator leads are stable with ICD device projecting over the left hemithorax. The aerated lungs are stable without acute pneumonic consolidation, effusion or CHF. 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 IMPRESSION: Postop volume loss of the right lung with elevated right hemidiaphragm and colonic interposition over the liver shadow. No acute pulmonary disease. Electronically Signed   By: Ashley Royalty M.D.   On: 03/30/2017 20:49    Cardiac Studies   Echo 01/15/17 Study Conclusions  - Left ventricle: The cavity size was normal. Wall thickness was   normal. Systolic function was normal. The estimated ejection   fraction was in the range of 60% to 65%. Wall motion was normal;   there were no regional wall motion abnormalities. Features are   consistent with a pseudonormal left ventricular filling pattern,   with concomitant abnormal  relaxation and increased filling   pressure (grade 2 diastolic dysfunction). - Aortic valve: Moderately to severely calcified annulus.   Moderately thickened, moderately calcified leaflets. There was   mild stenosis. Mean gradient (S): 16 mm Hg. Peak gradient (S): 31   mm Hg.   Patient Profile     82 y.o. male 82 y.o. male with history of LV systolic dysfunction (85-88% 01/2016) and subsequent recovery, CAD (40% LM, 80% mid LAD, 70% RCA, 03/2013), moderate AS, pAF s/p Watchman 01/2016, ICD 08/2013,  cirrhosis, who presents with intermittent CP x 1 week.  Over the past week, pt has noticed episodic CP, initially with exertion (I.e. While riding the elliptical) and progressing to episodes of rest pain by the afternoon of presentation.  Assessment & Plan    Unstable angina -Pt with progressively more frequent over a week exertional chest pain, starting in left arm and radiating to left chest lasting 2-3 minutes, occurring most often with exercise and resolving with rest, occ occurs at rest -Had severely calcified CAD involving LAD and RCA, mild AS by cath 03/2013 with concern that he will likely need CABG with AVR if he becomes symptomatic -Mild elevation in troponin, 0.31 (POC), 0.29 -IV heparin infusing -Currently CAD treated with aspirin, BB, pravastatin -Pt is high risk for progressed CAD. Plan for cardiac cath today for definitive cardiac evaluation.   History of systolic dysfunction -EF 50-27% 01/2016, most recent EF 60-65% in 01/2017. -Has dual chamber Medtronic ICD with optivol in place.  -On ACE-I, BB  Paroxysmal Atrial fibrillation -S/P Watchman device, recent ICD interogation on 03/18/17 showed 9.4% AT/F burden. Baseline rhythm is 1st degree AVB. -current treatment with BB and aspirin  Hyperlipidemia -Unable to tolerate lipitor in the past. Now treated with pravastatin -Last lipid panel on 10/21/2016: TC 114, LDL 53, HDL 49. At goal on statin.  Aortic stenosis -Moderately to  severely calcified annulus. Moderately thickened, moderately calcified leaflets. There was mild stenosis -Mean gradient 16 mm Hg,  -No symptoms or DOE, lightheadedness, pre-syncope or syncope.   Signed, Daune Perch, NP  03/31/2017, 7:39 AM     I have seen and examined the patient along with Daune Perch, NP .  I have reviewed the chart, notes and new data.  I agree with NP's note.  Key new complaints: currently asymptomatic, symptoms strongly suggestive of unstable angina Key examination changes: early peaking AS murmur, normal carotid and distal pulses Key new findings / data: mildly abnormal enzymes, trifascicular block  PLAN: Recommend R and L heart cath for CAD  and AS. Likely will have indication for surgical revascularization. Conflicting data on echo re: severity of AS, but clinically he has mild to moderate AS. If the valve appears heavily diseased on echo (will review) should have AVR at the time of CABG. This procedure has been fully reviewed with the patient and written informed consent has been obtained.   Sanda Klein, MD, [facility] [phone] 03/31/2017, 8:32 AM

## 2017-03-31 NOTE — H&P (Signed)
Cardiology History & Physical    Patient ID: Steven Everett MRN: [id number], DOB: 02/07/1935 Date of Encounter: 03/31/2017, 12:52 AM Primary Physician: Beatrice Lecher, MD Primary Cardiologist: Dr. Stanford Breed  Chief Complaint: Chest pain   HPI: Steven Everett is a 82 y.o. male with history of LV systolic dysfunction and subsequent recovery, CAD (40% LM, 80% mid LAD, 70% RCA), moderate AS, pAF s/p Watchman, cirrhosis, who presents with intermittent CP x 1 week.  Over the past week, pt has noticed episodic CP, initially with exertion (I.e. While riding the elliptical) and progressing to episodes of rest pain by the afternoon of presentation.  The pain was described as pressure that radiated to the left arm, without any associated N/V, diaphoresis or SOB.  Otherwise he has had no LE edema, PND, orthopnea, palpitations, syncope or ICD discharges.   Given his pain at rest, he presented to the ED, where initial evaluation was notable for unchanged ECG (RBBB and LAFB), but positive troponin.  VS were WNL.  He was started on heparin gtt and admitted to the cardiology service for further management.  Pt has remained pain free during his ED stay.  Past Medical History:  Diagnosis Date  . Aortic stenosis   . Barrett's esophagus   . Cancer of right lung ([place])    lower lobes  . Chronic systolic dysfunction of left ventricle   . Cirrhosis of liver (H[facility])    w/o mention of alcohol  . Diverticulosis of colon   . GERD (gastroesophageal reflux disease)   . Hemorrhoids, internal   . Hyperlipidemia   . Hypertension   . Hypothyroidism   . Ischemic cardiomyopathy   . Paroxysmal atrial fibrillation ([place]) 12/14/2013   a. s/p Watchman   . Rheumatic fever   . Seborrheic dermatitis   . Sinus node dysfunction ([facility])   . Type II diabetes mellitus ([place])    "weighed over 300#; lost weight; lost diabetes" (01/31/2016)     Surgical History:  Past Surgical History:  Procedure Laterality Date  . ANKLE FRACTURE  SURGERY Right 1940s  . APPENDECTOMY  1940s  . CARDIAC CATHETERIZATION  "years ago"  . CARDIAC DEFIBRILLATOR PLACEMENT  08/12/13   Medtronic Evera XT DR implanted by [name] for primary prevention of sudden death  . CARDIAC PACEMAKER PLACEMENT    . CATARACT EXTRACTION, BILATERAL Bilateral 8-11   Dr Ayesha Rumpf  . COLONOSCOPY    . FRACTURE SURGERY    . IMPLANTABLE CARDIOVERTER DEFIBRILLATOR IMPLANT N/A 08/12/2013   Procedure: IMPLANTABLE CARDIOVERTER DEFIBRILLATOR IMPLANT;  Surgeon: Coralyn Mark, MD;  Location: [facility] CATH LAB;  Service: Cardiovascular;  Laterality: N/A;  . JOINT REPLACEMENT    . LEFT ATRIAL APPENDAGE OCCLUSION N/A 01/31/2016   Procedure: LEFT ATRIAL APPENDAGE OCCLUSION;  Surgeon: Thompson Grayer, MD;  Location: [facility] CV LAB;  Service: Cardiovascular;  Laterality: N/A;  . LUMBAR DISC SURGERY  X 2   "herniated discs"  . LUNG REMOVAL, PARTIAL Right 1998   for lung cancer by Dr Rollene Fare; middle and lower lobes  . TEE WITHOUT CARDIOVERSION N/A 02/14/2013   Procedure: TRANSESOPHAGEAL ECHOCARDIOGRAM (TEE);  Surgeon: Lelon Perla, MD;  Location: [facility] ENDOSCOPY;  Service: Cardiovascular;  Laterality: N/A;  . TEE WITHOUT CARDIOVERSION N/A 01/09/2016   Procedure: TRANSESOPHAGEAL ECHOCARDIOGRAM (TEE);  Surgeon: Josue Hector, MD;  Location: [facility] ENDOSCOPY;  Service: Cardiovascular;  Laterality: N/A;  . TEE WITHOUT CARDIOVERSION N/A 03/20/2016   Procedure: TRANSESOPHAGEAL ECHOCARDIOGRAM (TEE);  Surgeon: Lelon Perla, MD;  Location:  [facility] ENDOSCOPY;  Service: Cardiovascular;  Laterality: N/A;  . TOTAL HIP ARTHROPLASTY Right    Dr Alvan Dame     Home Meds: Prior to Admission medications   Medication Sig Start Date End Date Taking? Authorizing Provider  aspirin 81 MG tablet TAKE 1 TABLET  BY MOUTH IN THE MORNING AND 1 TABLET BY MOUTH AT NIGHT.   Yes Historical Provider, MD  carvedilol (COREG) 6.25 MG tablet Take 1 tablet (6.25 mg total) by mouth 2 (two) times daily with a meal. 03/19/17  Yes Lelon Perla, MD  levothyroxine (SYNTHROID, LEVOTHROID) 112 MCG tablet Take 1 tablet (112 mcg total) by mouth daily before breakfast. 03/24/17  Yes Hali Marry, MD  lisinopril (PRINIVIL,ZESTRIL) 5 MG tablet Take 1 tablet (5 mg total) by mouth daily. 03/18/17  Yes Lelon Perla, MD  pravastatin (PRAVACHOL) 40 MG tablet Take 1 tablet (40 mg total) by mouth daily. 03/18/17  Yes Lelon Perla, MD    Allergies:  Allergies  Allergen Reactions  . Atorvastatin Other (See Comments)    joint aches    Social History   Social History  . Marital status: Widowed    Spouse name: Steven Everett  . Number of children: 1  . Years of education: N/A   Occupational History  .  Retired   Social History Main Topics  . Smoking status: Former Smoker    Packs/day: 2.00    Years: 36.00    Types: Cigarettes    Quit date: 02/14/1989  . Smokeless tobacco: Never Used  . Alcohol use No  . Drug use: No  . Sexual activity: Not Currently   Other Topics Concern  . Not on file   Social History Narrative  . No narrative on file     Family History  Problem Relation Age of Onset  . Cancer Mother     breast  . Diabetes Mother   . Cancer Father     Lung  . Diabetes      Review of Systems: Negative except as aforementioned in the HPI.  Labs:   Lab Results  Component Value Date   WBC 4.8 03/30/2017   HGB 12.8 (L) 03/30/2017   HCT 39.6 03/30/2017   MCV 87.4 03/30/2017   PLT 139 (L) 03/30/2017    Recent Labs Lab 03/30/17 2005  NA 139  K 3.8  CL 105  CO2 26  BUN 15  CREATININE 1.04  CALCIUM 8.9  GLUCOSE 102*   No results for input(s): CKTOTAL, CKMB, TROPONINI in the last 72 hours. Lab Results  Component Value Date   CHOL 114 10/21/2016   HDL 49 10/21/2016   LDLCALC 53 10/21/2016   TRIG 58 10/21/2016   No results found for: DDIMER  Radiology/Studies:  Dg Chest 2 View  Result Date: 03/30/2017 CLINICAL DATA:  Waxing and waning chest pain for several days worse this morning.  History of right lung cancer with resection of middle and lower lobes. EXAM: CHEST  2 VIEW COMPARISON:  None. FINDINGS: Postop volume loss of the right lung post resection of right middle and lower lobes with elevated right hemidiaphragm and colonic interposition noted over the liver shadow. Aortic atherosclerosis with uncoiled appearance of the thoracic aorta is stable right atrial pacer and right ventricular defibrillator leads are stable with ICD device projecting over the left hemithorax. The aerated lungs are stable without acute pneumonic consolidation, effusion or CHF. 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 IMPRESSION: Postop volume loss of the right lung with elevated right hemidiaphragm and colonic interposition over the liver shadow. No acute pulmonary disease. Electronically Signed   By: Ashley Royalty M.D.   On: 03/30/2017 20:49   Wt Readings from Last 3 Encounters:  03/30/17 86.2 kg (190 lb)  02/03/17 87.5 kg (193 lb)  01/16/17 86.6 kg (191 lb)    EKG: NSR, 1st degree AVB, RBBB, LAFB.  No acute ischemic changes  Physical Exam Blood pressure 126/67, pulse 78, temperature 97.8 F (36.6 C), temperature source Oral, resp. rate 15, height '6\' 1"'$  (1.854 m), weight 86.2 kg (190 lb), SpO2 99 %. Body mass index is 25.07 kg/m. General: Well developed, well nourished, in no acute distress. Head: Normocephalic, atraumatic, sclera non-icteric, no xanthomas, nares are without discharge.  Neck: Negative for carotid bruits. JVD not elevated. Lungs: Clear bilaterally to auscultation without wheezes, rales, or rhonchi. Breathing is unlabored.  ICD site without warmth or erythema Heart: RRR with S1 S2. 3/6 SEM at base, no rubs or gallops appreciated. Abdomen: Soft, non-tender, non-distended with normoactive bowel sounds. No hepatomegaly. No rebound/guarding. No obvious abdominal masses. Msk:  Strength and tone appear normal for  age. Extremities: No clubbing or cyanosis. No edema.  Distal pedal pulses are 2+ and equal bilaterally. Neuro: Alert and oriented X 3. No focal deficit. No facial asymmetry. Moves all extremities spontaneously. Psych:  Responds to questions appropriately with a normal affect.    Assessment and Plan  HFrEF (EF 30-35%), CAD (40% LM, 80% mid LAD, 70% RCA), moderate AS, pAF s/p Watchman, cirrhosis, who presents with intermittent CP x 1 week, found to have NSTEMI.  1.  NSTEMI: Currently pain free.  Continue ASA, heparin gtt, keep NPO pMN for possible cath in AM.  Significant coronary disease on most recent cath in 2014.  2.  Previous HFrEF (EF recovered to 60-65% om most recent TTE).   Euvolemic on exam.  Continuing home carvedilol and lisinopril.  3.  HLD: Intolerant of atorvastatin, but excellent lipid control on pravastatin.  Will continue for time being.  4.  Hypothyroidism: Recent TSH WNL.  Continue home synthroid.  Signed, Doylene Canning, MD 03/31/2017, 12:52 AM Pager: [phone]

## 2017-04-01 ENCOUNTER — Inpatient Hospital Stay (HOSPITAL_COMMUNITY): Payer: Medicare Other | Admitting: Certified Registered"

## 2017-04-01 ENCOUNTER — Inpatient Hospital Stay (HOSPITAL_COMMUNITY): Payer: Medicare Other

## 2017-04-01 ENCOUNTER — Inpatient Hospital Stay (HOSPITAL_COMMUNITY)
Admission: EM | Disposition: A | Discharge status: Home / Self Care | Payer: Self-pay | Source: Home / Self Care | Attending: Cardiothoracic Surgery

## 2017-04-01 ENCOUNTER — Encounter (HOSPITAL_COMMUNITY): Payer: Self-pay

## 2017-04-01 DIAGNOSIS — I2510 Atherosclerotic heart disease of native coronary artery without angina pectoris: Secondary | ICD-10-CM | POA: Diagnosis present

## 2017-04-01 DIAGNOSIS — I25110 Atherosclerotic heart disease of native coronary artery with unstable angina pectoris: Secondary | ICD-10-CM

## 2017-04-01 HISTORY — PX: TEE WITHOUT CARDIOVERSION: SHX5443

## 2017-04-01 HISTORY — PX: CORONARY ARTERY BYPASS GRAFT: SHX141

## 2017-04-01 LAB — POCT I-STAT, CHEM 8
BUN: 13 mg/dL (ref 6–20)
BUN: 12 mg/dL (ref 6–20)
BUN: 12 mg/dL (ref 6–20)
BUN: 13 mg/dL (ref 6–20)
BUN: 13 mg/dL (ref 6–20)
BUN: 13 mg/dL (ref 6–20)
Calcium, Ion: 1.27 mmol/L (ref 1.15–1.40)
Calcium, Ion: 1.07 mmol/L — ABNORMAL LOW (ref 1.15–1.40)
Calcium, Ion: 1.08 mmol/L — ABNORMAL LOW (ref 1.15–1.40)
Calcium, Ion: 1.14 mmol/L — ABNORMAL LOW (ref 1.15–1.40)
Calcium, Ion: 1.13 mmol/L — ABNORMAL LOW (ref 1.15–1.40)
Calcium, Ion: 1.14 mmol/L — ABNORMAL LOW (ref 1.15–1.40)
Chloride: 103 mmol/L (ref 101–111)
Chloride: 103 mmol/L (ref 101–111)
Chloride: 100 mmol/L — ABNORMAL LOW (ref 101–111)
Chloride: 101 mmol/L (ref 101–111)
Chloride: 102 mmol/L (ref 101–111)
Chloride: 109 mmol/L (ref 101–111)
Creatinine, Ser: 0.8 mg/dL (ref 0.61–1.24)
Creatinine, Ser: 0.7 mg/dL (ref 0.61–1.24)
Creatinine, Ser: 0.8 mg/dL (ref 0.61–1.24)
Creatinine, Ser: 0.8 mg/dL (ref 0.61–1.24)
Creatinine, Ser: 0.8 mg/dL (ref 0.61–1.24)
Creatinine, Ser: 0.8 mg/dL (ref 0.61–1.24)
Glucose, Bld: 102 mg/dL — ABNORMAL HIGH (ref 65–99)
Glucose, Bld: 108 mg/dL — ABNORMAL HIGH (ref 65–99)
Glucose, Bld: 152 mg/dL — ABNORMAL HIGH (ref 65–99)
Glucose, Bld: 156 mg/dL — ABNORMAL HIGH (ref 65–99)
Glucose, Bld: 160 mg/dL — ABNORMAL HIGH (ref 65–99)
Glucose, Bld: 141 mg/dL — ABNORMAL HIGH (ref 65–99)
HCT: 41 % (ref 39.0–52.0)
HCT: 26 % — ABNORMAL LOW (ref 39.0–52.0)
HCT: 24 % — ABNORMAL LOW (ref 39.0–52.0)
HCT: 27 % — ABNORMAL LOW (ref 39.0–52.0)
HCT: 34 % — ABNORMAL LOW (ref 39.0–52.0)
HCT: 34 % — ABNORMAL LOW (ref 39.0–52.0)
Hemoglobin: 13.9 g/dL (ref 13.0–17.0)
Hemoglobin: 8.8 g/dL — ABNORMAL LOW (ref 13.0–17.0)
Hemoglobin: 8.2 g/dL — ABNORMAL LOW (ref 13.0–17.0)
Hemoglobin: 9.2 g/dL — ABNORMAL LOW (ref 13.0–17.0)
Hemoglobin: 11.6 g/dL — ABNORMAL LOW (ref 13.0–17.0)
Hemoglobin: 11.6 g/dL — ABNORMAL LOW (ref 13.0–17.0)
Potassium: 4.2 mmol/L (ref 3.5–5.1)
Potassium: 5.4 mmol/L — ABNORMAL HIGH (ref 3.5–5.1)
Potassium: 5.1 mmol/L (ref 3.5–5.1)
Potassium: 4.5 mmol/L (ref 3.5–5.1)
Potassium: 4 mmol/L (ref 3.5–5.1)
Potassium: 3.9 mmol/L (ref 3.5–5.1)
Sodium: 138 mmol/L (ref 135–145)
Sodium: 137 mmol/L (ref 135–145)
Sodium: 133 mmol/L — ABNORMAL LOW (ref 135–145)
Sodium: 136 mmol/L (ref 135–145)
Sodium: 138 mmol/L (ref 135–145)
Sodium: 140 mmol/L (ref 135–145)
TCO2: 34 mmol/L (ref 0–100)
TCO2: 28 mmol/L (ref 0–100)
TCO2: 26 mmol/L (ref 0–100)
TCO2: 27 mmol/L (ref 0–100)
TCO2: 28 mmol/L (ref 0–100)
TCO2: 23 mmol/L (ref 0–100)

## 2017-04-01 LAB — BASIC METABOLIC PANEL
Anion gap: 7 (ref 5–15)
BUN: 13 mg/dL (ref 6–20)
CO2: 28 mmol/L (ref 22–32)
Calcium: 9.5 mg/dL (ref 8.9–10.3)
Chloride: 103 mmol/L (ref 101–111)
Creatinine, Ser: 1.14 mg/dL (ref 0.61–1.24)
GFR calc Af Amer: >60 mL/min (ref >60)
GFR calc non Af Amer: 59 mL/min — ABNORMAL LOW (ref >60)
Glucose, Bld: 98 mg/dL (ref 65–99)
Potassium: 5.2 mmol/L — ABNORMAL HIGH (ref 3.5–5.1)
Sodium: 138 mmol/L (ref 135–145)

## 2017-04-01 LAB — POCT I-STAT 3, ART BLOOD GAS (G3+)
Acid-Base Excess: 2 mmol/L (ref 0.0–2.0)
Acid-Base Excess: 3 mmol/L — ABNORMAL HIGH (ref 0.0–2.0)
Acid-Base Excess: 1 mmol/L (ref 0.0–2.0)
Acid-base deficit: 1 mmol/L (ref 0.0–2.0)
Acid-base deficit: 2 mmol/L (ref 0.0–2.0)
Acid-base deficit: 1 mmol/L (ref 0.0–2.0)
Acid-base deficit: 9 mmol/L — ABNORMAL HIGH (ref 0.0–2.0)
Acid-base deficit: 4 mmol/L — ABNORMAL HIGH (ref 0.0–2.0)
Bicarbonate: 23.2 mmol/L (ref 20.0–28.0)
Bicarbonate: 25 mmol/L (ref 20.0–28.0)
Bicarbonate: 25.3 mmol/L (ref 20.0–28.0)
Bicarbonate: 25.8 mmol/L (ref 20.0–28.0)
Bicarbonate: 24.1 mmol/L (ref 20.0–28.0)
Bicarbonate: 23.3 mmol/L (ref 20.0–28.0)
Bicarbonate: 13.5 mmol/L — ABNORMAL LOW (ref 20.0–28.0)
Bicarbonate: 20.7 mmol/L (ref 20.0–28.0)
O2 Saturation: 62 %
O2 Saturation: 95 %
O2 Saturation: 100 %
O2 Saturation: 100 %
O2 Saturation: 100 %
O2 Saturation: 99 %
O2 Saturation: 100 %
O2 Saturation: 99 %
Patient temperature: 36.7
TCO2: 24 mmol/L (ref 0–100)
TCO2: 26 mmol/L (ref 0–100)
TCO2: 26 mmol/L (ref 0–100)
TCO2: 27 mmol/L (ref 0–100)
TCO2: 25 mmol/L (ref 0–100)
TCO2: 24 mmol/L (ref 0–100)
TCO2: 14 mmol/L (ref 0–100)
TCO2: 22 mmol/L (ref 0–100)
pCO2 arterial: 39.7 mmHg (ref 32.0–48.0)
pCO2 arterial: 46.2 mmHg (ref 32.0–48.0)
pCO2 arterial: 33.2 mmHg (ref 32.0–48.0)
pCO2 arterial: 34.1 mmHg (ref 32.0–48.0)
pCO2 arterial: 33.5 mmHg (ref 32.0–48.0)
pCO2 arterial: 37.4 mmHg (ref 32.0–48.0)
pCO2 arterial: 20 mmHg — ABNORMAL LOW (ref 32.0–48.0)
pCO2 arterial: 34.4 mmHg (ref 32.0–48.0)
pH, Arterial: 7.341 — ABNORMAL LOW (ref 7.350–7.450)
pH, Arterial: 7.375 (ref 7.350–7.450)
pH, Arterial: 7.478 — ABNORMAL HIGH (ref 7.350–7.450)
pH, Arterial: 7.498 — ABNORMAL HIGH (ref 7.350–7.450)
pH, Arterial: 7.465 — ABNORMAL HIGH (ref 7.350–7.450)
pH, Arterial: 7.403 (ref 7.350–7.450)
pH, Arterial: 7.439 (ref 7.350–7.450)
pH, Arterial: 7.386 (ref 7.350–7.450)
pO2, Arterial: 34 mmHg — CL (ref 83.0–108.0)
pO2, Arterial: 75 mmHg — ABNORMAL LOW (ref 83.0–108.0)
pO2, Arterial: 340 mmHg — ABNORMAL HIGH (ref 83.0–108.0)
pO2, Arterial: 410 mmHg — ABNORMAL HIGH (ref 83.0–108.0)
pO2, Arterial: 374 mmHg — ABNORMAL HIGH (ref 83.0–108.0)
pO2, Arterial: 136 mmHg — ABNORMAL HIGH (ref 83.0–108.0)
pO2, Arterial: 187 mmHg — ABNORMAL HIGH (ref 83.0–108.0)
pO2, Arterial: 149 mmHg — ABNORMAL HIGH (ref 83.0–108.0)

## 2017-04-01 LAB — POCT I-STAT 4, (NA,K, GLUC, HGB,HCT)
Glucose, Bld: 142 mg/dL — ABNORMAL HIGH (ref 65–99)
HCT: 38 % — ABNORMAL LOW (ref 39.0–52.0)
Hemoglobin: 12.9 g/dL — ABNORMAL LOW (ref 13.0–17.0)
Potassium: 3.7 mmol/L (ref 3.5–5.1)
Sodium: 142 mmol/L (ref 135–145)

## 2017-04-01 LAB — CBC
HCT: 45.9 % (ref 39.0–52.0)
HCT: 41 % (ref 39.0–52.0)
HCT: 37.1 % — ABNORMAL LOW (ref 39.0–52.0)
Hemoglobin: 14.7 g/dL (ref 13.0–17.0)
Hemoglobin: 13.3 g/dL (ref 13.0–17.0)
Hemoglobin: 12.2 g/dL — ABNORMAL LOW (ref 13.0–17.0)
MCH: 28.1 pg (ref 26.0–34.0)
MCH: 27.7 pg (ref 26.0–34.0)
MCH: 28 pg (ref 26.0–34.0)
MCHC: 32 g/dL (ref 30.0–36.0)
MCHC: 32.4 g/dL (ref 30.0–36.0)
MCHC: 32.9 g/dL (ref 30.0–36.0)
MCV: 87.6 fL (ref 78.0–100.0)
MCV: 85.4 fL (ref 78.0–100.0)
MCV: 85.3 fL (ref 78.0–100.0)
PLATELETS: 108 10*3/uL — AB (ref 150–400)
Platelets: 142 10*3/uL — ABNORMAL LOW (ref 150–400)
Platelets: 96 10*3/uL — ABNORMAL LOW (ref 150–400)
RBC: 5.24 MIL/uL (ref 4.22–5.81)
RBC: 4.8 MIL/uL (ref 4.22–5.81)
RBC: 4.35 MIL/uL (ref 4.22–5.81)
RDW: 14.8 % (ref 11.5–15.5)
RDW: 14.3 % (ref 11.5–15.5)
RDW: 14.2 % (ref 11.5–15.5)
WBC: 5.4 10*3/uL (ref 4.0–10.5)
WBC: 14.8 10*3/uL — ABNORMAL HIGH (ref 4.0–10.5)
WBC: 14.2 10*3/uL — ABNORMAL HIGH (ref 4.0–10.5)

## 2017-04-01 LAB — GLUCOSE, CAPILLARY
Glucose-Capillary: 108 mg/dL — ABNORMAL HIGH (ref 65–99)
Glucose-Capillary: 94 mg/dL (ref 65–99)
Glucose-Capillary: 92 mg/dL (ref 65–99)
Glucose-Capillary: 78 mg/dL (ref 65–99)
Glucose-Capillary: 103 mg/dL — ABNORMAL HIGH (ref 65–99)
Glucose-Capillary: 100 mg/dL — ABNORMAL HIGH (ref 65–99)
Glucose-Capillary: 138 mg/dL — ABNORMAL HIGH (ref 65–99)
Glucose-Capillary: 131 mg/dL — ABNORMAL HIGH (ref 65–99)

## 2017-04-01 LAB — POCT ACTIVATED CLOTTING TIME: Activated Clotting Time: 180 seconds

## 2017-04-01 LAB — CREATININE, SERUM
Creatinine, Ser: 1.02 mg/dL (ref 0.61–1.24)
GFR calc Af Amer: >60 mL/min (ref >60)
GFR calc non Af Amer: >60 mL/min (ref >60)

## 2017-04-01 LAB — PROTIME-INR
INR: 1.75
PROTHROMBIN TIME: 20.6 s — AB (ref 11.4–15.2)

## 2017-04-01 LAB — SURGICAL PCR SCREEN
MRSA, PCR: NEGATIVE
Staphylococcus aureus: NEGATIVE

## 2017-04-01 LAB — HEMOGLOBIN AND HEMATOCRIT, BLOOD
HCT: 26.7 % — ABNORMAL LOW (ref 39.0–52.0)
Hemoglobin: 8.8 g/dL — ABNORMAL LOW (ref 13.0–17.0)

## 2017-04-01 LAB — MAGNESIUM: Magnesium: 2.7 mg/dL — ABNORMAL HIGH (ref 1.7–2.4)

## 2017-04-01 LAB — APTT: aPTT: 29 seconds (ref 24–36)

## 2017-04-01 LAB — PLATELET COUNT: Platelets: 101 10*3/uL — ABNORMAL LOW (ref 150–400)

## 2017-04-01 SURGERY — CORONARY ARTERY BYPASS GRAFTING (CABG)
Anesthesia: General | Site: Chest

## 2017-04-01 MED ORDER — ALBUMIN HUMAN 5 % IV SOLN
INTRAVENOUS | Status: DC | PRN
  Administered 2017-04-01: 13:00:00 via INTRAVENOUS

## 2017-04-01 MED ORDER — LACTATED RINGERS IV SOLN
INTRAVENOUS | Status: DC | PRN
  Administered 2017-04-01 (×2): via INTRAVENOUS

## 2017-04-01 MED ORDER — VANCOMYCIN HCL IN DEXTROSE 1-5 GM/200ML-% IV SOLN
1000.0000 mg | Freq: Once | INTRAVENOUS | Status: AC
  Administered 2017-04-01: 1000 mg via INTRAVENOUS
  Filled 2017-04-01: qty 200

## 2017-04-01 MED ORDER — SODIUM CHLORIDE 0.9 % IV SOLN
INTRAVENOUS | Status: DC
  Administered 2017-04-01: 1.2 [IU]/h via INTRAVENOUS
  Filled 2017-04-01 (×2): qty 2.5

## 2017-04-01 MED ORDER — HEPARIN SODIUM (PORCINE) 1000 UNIT/ML IJ SOLN
INTRAMUSCULAR | Status: DC | PRN
  Administered 2017-04-01: 29000 [IU] via INTRAVENOUS

## 2017-04-01 MED ORDER — NITROGLYCERIN IN D5W 200-5 MCG/ML-% IV SOLN: 0.0000 ug/min | INTRAVENOUS | Status: DC

## 2017-04-01 MED ORDER — ACETAMINOPHEN 650 MG RE SUPP
650.0000 mg | Freq: Once | RECTAL | Status: AC
  Administered 2017-04-01: 650 mg via RECTAL

## 2017-04-01 MED ORDER — ORAL CARE MOUTH RINSE
15.0000 mL | Freq: Two times a day (BID) | OROMUCOSAL | Status: DC
  Administered 2017-04-01 – 2017-04-08 (×5): 15 mL via OROMUCOSAL

## 2017-04-01 MED ORDER — HEPARIN SODIUM (PORCINE) 1000 UNIT/ML IJ SOLN
INTRAMUSCULAR | Status: AC
  Filled 2017-04-01: qty 1

## 2017-04-01 MED ORDER — PROTAMINE SULFATE 10 MG/ML IV SOLN
INTRAVENOUS | Status: AC
  Filled 2017-04-01: qty 5

## 2017-04-01 MED ORDER — FINASTERIDE 5 MG PO TABS
5.0000 mg | ORAL_TABLET | Freq: Every day | ORAL | Status: DC
  Administered 2017-04-02 – 2017-04-09 (×8): 5 mg via ORAL
  Filled 2017-04-01 (×8): qty 1

## 2017-04-01 MED ORDER — DEXTROSE 5 % IV SOLN
1.5000 g | Freq: Two times a day (BID) | INTRAVENOUS | Status: AC
  Administered 2017-04-01 – 2017-04-03 (×4): 1.5 g via INTRAVENOUS
  Filled 2017-04-01 (×4): qty 1.5

## 2017-04-01 MED ORDER — SODIUM CHLORIDE 0.9% FLUSH
3.0000 mL | Freq: Two times a day (BID) | INTRAVENOUS | Status: DC
  Administered 2017-04-02 – 2017-04-05 (×8): 3 mL via INTRAVENOUS

## 2017-04-01 MED ORDER — MIDAZOLAM HCL 5 MG/5ML IJ SOLN
INTRAMUSCULAR | Status: DC | PRN
  Administered 2017-04-01 (×2): 2 mg via INTRAVENOUS
  Administered 2017-04-01: 3 mg via INTRAVENOUS
  Administered 2017-04-01: 2 mg via INTRAVENOUS
  Administered 2017-04-01: 1 mg via INTRAVENOUS

## 2017-04-01 MED ORDER — MAGNESIUM SULFATE 4 GM/100ML IV SOLN
4.0000 g | Freq: Once | INTRAVENOUS | Status: AC
  Administered 2017-04-01: 4 g via INTRAVENOUS
  Filled 2017-04-01: qty 100

## 2017-04-01 MED ORDER — PROTAMINE SULFATE 10 MG/ML IV SOLN
INTRAVENOUS | Status: DC | PRN
  Administered 2017-04-01: 40 mg via INTRAVENOUS
  Administered 2017-04-01 (×5): 50 mg via INTRAVENOUS

## 2017-04-01 MED ORDER — ACETAMINOPHEN 500 MG PO TABS
1000.0000 mg | ORAL_TABLET | Freq: Four times a day (QID) | ORAL | Status: DC
  Administered 2017-04-02 – 2017-04-06 (×13): 1000 mg via ORAL
  Filled 2017-04-01 (×15): qty 2

## 2017-04-01 MED ORDER — SODIUM CHLORIDE 0.9 % IV SOLN
0.0000 ug/kg/h | INTRAVENOUS | Status: DC
  Filled 2017-04-01 (×3): qty 2

## 2017-04-01 MED ORDER — ASPIRIN EC 325 MG PO TBEC
325.0000 mg | DELAYED_RELEASE_TABLET | Freq: Every day | ORAL | Status: DC
  Administered 2017-04-02 – 2017-04-05 (×4): 325 mg via ORAL
  Filled 2017-04-01 (×4): qty 1

## 2017-04-01 MED ORDER — 0.9 % SODIUM CHLORIDE (POUR BTL) OPTIME
TOPICAL | Status: DC | PRN
  Administered 2017-04-01: 6000 mL

## 2017-04-01 MED ORDER — SODIUM CHLORIDE 0.9 % IJ SOLN
OROMUCOSAL | Status: DC | PRN
  Administered 2017-04-01 (×3): 4 mL via TOPICAL

## 2017-04-01 MED ORDER — PANTOPRAZOLE SODIUM 40 MG PO TBEC
40.0000 mg | DELAYED_RELEASE_TABLET | Freq: Every day | ORAL | Status: DC
Start: 2017-04-03 — End: 2017-04-06
  Administered 2017-04-03 – 2017-04-05 (×3): 40 mg via ORAL
  Filled 2017-04-01 (×3): qty 1

## 2017-04-01 MED ORDER — METOPROLOL TARTRATE 5 MG/5ML IV SOLN
2.5000 mg | INTRAVENOUS | Status: DC | PRN
  Administered 2017-04-02: 5 mg via INTRAVENOUS
  Filled 2017-04-01: qty 5

## 2017-04-01 MED ORDER — SODIUM CHLORIDE 0.45 % IV SOLN: INTRAVENOUS | Status: DC | PRN

## 2017-04-01 MED ORDER — LACTATED RINGERS IV SOLN: INTRAVENOUS | Status: DC

## 2017-04-01 MED ORDER — ONDANSETRON HCL 4 MG/2ML IJ SOLN
4.0000 mg | Freq: Four times a day (QID) | INTRAMUSCULAR | Status: DC | PRN
  Administered 2017-04-01 – 2017-04-04 (×5): 4 mg via INTRAVENOUS
  Filled 2017-04-01 (×5): qty 2

## 2017-04-01 MED ORDER — SODIUM CHLORIDE 0.9 % IV SOLN
0.0000 ug/min | INTRAVENOUS | Status: DC
  Administered 2017-04-02: 35 ug/min via INTRAVENOUS
  Administered 2017-04-03 (×2): 80 ug/min via INTRAVENOUS
  Filled 2017-04-01 (×8): qty 2

## 2017-04-01 MED ORDER — DOCUSATE SODIUM 100 MG PO CAPS
200.0000 mg | ORAL_CAPSULE | Freq: Every day | ORAL | Status: DC
  Administered 2017-04-02 – 2017-04-05 (×4): 200 mg via ORAL
  Filled 2017-04-01 (×4): qty 2

## 2017-04-01 MED ORDER — PROPOFOL 10 MG/ML IV BOLUS
INTRAVENOUS | Status: AC
  Filled 2017-04-01: qty 20

## 2017-04-01 MED ORDER — MORPHINE SULFATE (PF) 4 MG/ML IV SOLN
2.0000 mg | INTRAVENOUS | Status: DC | PRN
  Administered 2017-04-01 – 2017-04-02 (×2): 4 mg via INTRAVENOUS
  Administered 2017-04-03: 2 mg via INTRAVENOUS
  Filled 2017-04-01 (×3): qty 1

## 2017-04-01 MED ORDER — MORPHINE SULFATE (PF) 4 MG/ML IV SOLN: 1.0000 mg | INTRAVENOUS | Status: AC | PRN

## 2017-04-01 MED ORDER — BISACODYL 5 MG PO TBEC
10.0000 mg | DELAYED_RELEASE_TABLET | Freq: Every day | ORAL | Status: DC
  Administered 2017-04-02 – 2017-04-04 (×3): 10 mg via ORAL
  Filled 2017-04-01 (×3): qty 2

## 2017-04-01 MED ORDER — ORAL CARE MOUTH RINSE
15.0000 mL | Freq: Four times a day (QID) | OROMUCOSAL | Status: DC
  Administered 2017-04-01: 15 mL via OROMUCOSAL

## 2017-04-01 MED ORDER — SODIUM CHLORIDE 0.9% FLUSH: 3.0000 mL | INTRAVENOUS | Status: DC | PRN

## 2017-04-01 MED ORDER — MIDAZOLAM HCL 10 MG/2ML IJ SOLN
INTRAMUSCULAR | Status: AC
  Filled 2017-04-01: qty 2

## 2017-04-01 MED ORDER — LACTATED RINGERS IV SOLN: 500.0000 mL | Freq: Once | INTRAVENOUS | Status: DC | PRN

## 2017-04-01 MED ORDER — INSULIN REGULAR BOLUS VIA INFUSION
0.0000 [IU] | Freq: Three times a day (TID) | INTRAVENOUS | Status: DC
  Filled 2017-04-01: qty 10

## 2017-04-01 MED ORDER — PHENYLEPHRINE HCL 10 MG/ML IJ SOLN
INTRAMUSCULAR | Status: DC | PRN
  Administered 2017-04-01: 80 ug via INTRAVENOUS
  Administered 2017-04-01: 160 ug via INTRAVENOUS
  Administered 2017-04-01: 80 ug via INTRAVENOUS
  Administered 2017-04-01: 40 ug via INTRAVENOUS
  Administered 2017-04-01: 80 ug via INTRAVENOUS
  Administered 2017-04-01: 40 ug via INTRAVENOUS

## 2017-04-01 MED ORDER — FENTANYL CITRATE (PF) 250 MCG/5ML IJ SOLN
INTRAMUSCULAR | Status: DC | PRN
  Administered 2017-04-01: 200 ug via INTRAVENOUS
  Administered 2017-04-01 (×3): 250 ug via INTRAVENOUS
  Administered 2017-04-01: 200 ug via INTRAVENOUS
  Administered 2017-04-01: 50 ug via INTRAVENOUS

## 2017-04-01 MED ORDER — ACETAMINOPHEN 160 MG/5ML PO SOLN
650.0000 mg | Freq: Once | ORAL | Status: AC
Start: 2017-04-01 — End: 2017-04-01

## 2017-04-01 MED ORDER — FENTANYL CITRATE (PF) 250 MCG/5ML IJ SOLN
INTRAMUSCULAR | Status: AC
  Filled 2017-04-01: qty 25

## 2017-04-01 MED ORDER — ALFUZOSIN HCL ER 10 MG PO TB24
10.0000 mg | ORAL_TABLET | Freq: Every day | ORAL | Status: DC
  Administered 2017-04-02 – 2017-04-08 (×5): 10 mg via ORAL
  Filled 2017-04-01 (×9): qty 1

## 2017-04-01 MED ORDER — MILRINONE LACTATE IN DEXTROSE 20-5 MG/100ML-% IV SOLN
0.1250 ug/kg/min | INTRAVENOUS | Status: AC
  Administered 2017-04-01: 5 ug/kg/min via INTRAVENOUS
  Filled 2017-04-01: qty 100

## 2017-04-01 MED ORDER — TRAMADOL HCL 50 MG PO TABS
50.0000 mg | ORAL_TABLET | ORAL | Status: DC | PRN
  Administered 2017-04-05: 50 mg via ORAL
  Filled 2017-04-01: qty 1

## 2017-04-01 MED ORDER — PROPOFOL 10 MG/ML IV BOLUS
INTRAVENOUS | Status: DC | PRN
  Administered 2017-04-01: 20 mg via INTRAVENOUS
  Administered 2017-04-01: 50 mg via INTRAVENOUS

## 2017-04-01 MED ORDER — MILRINONE LACTATE IN DEXTROSE 20-5 MG/100ML-% IV SOLN
0.1250 ug/kg/min | INTRAVENOUS | Status: DC
Start: 2017-04-01 — End: 2017-04-04
  Administered 2017-04-01 – 2017-04-02 (×3): 0.3 ug/kg/min via INTRAVENOUS
  Filled 2017-04-01 (×5): qty 100

## 2017-04-01 MED ORDER — LEVALBUTEROL HCL 1.25 MG/0.5ML IN NEBU
1.2500 mg | INHALATION_SOLUTION | Freq: Four times a day (QID) | RESPIRATORY_TRACT | Status: DC
  Administered 2017-04-01: 1.25 mg via RESPIRATORY_TRACT

## 2017-04-01 MED ORDER — MIDAZOLAM HCL 2 MG/2ML IJ SOLN: 2.0000 mg | INTRAMUSCULAR | Status: DC | PRN

## 2017-04-01 MED ORDER — LEVALBUTEROL HCL 1.25 MG/0.5ML IN NEBU: 1.2500 mg | INHALATION_SOLUTION | Freq: Four times a day (QID) | RESPIRATORY_TRACT | Status: DC | PRN

## 2017-04-01 MED ORDER — BISACODYL 10 MG RE SUPP: 10.0000 mg | Freq: Every day | RECTAL | Status: DC

## 2017-04-01 MED ORDER — OXYCODONE HCL 5 MG PO TABS: 5.0000 mg | ORAL_TABLET | ORAL | Status: DC | PRN

## 2017-04-01 MED ORDER — SODIUM CHLORIDE 0.9 % IV SOLN: 250.0000 mL | INTRAVENOUS | Status: DC

## 2017-04-01 MED ORDER — ASPIRIN 81 MG PO CHEW: 324.0000 mg | CHEWABLE_TABLET | Freq: Every day | ORAL | Status: DC

## 2017-04-01 MED ORDER — ACETAMINOPHEN 160 MG/5ML PO SOLN: 1000.0000 mg | Freq: Four times a day (QID) | ORAL | Status: DC

## 2017-04-01 MED ORDER — ROCURONIUM BROMIDE 100 MG/10ML IV SOLN
INTRAVENOUS | Status: DC | PRN
  Administered 2017-04-01: 50 mg via INTRAVENOUS
  Administered 2017-04-01: 10 mg via INTRAVENOUS
  Administered 2017-04-01: 50 mg via INTRAVENOUS
  Administered 2017-04-01: 90 mg via INTRAVENOUS

## 2017-04-01 MED ORDER — POTASSIUM CHLORIDE 10 MEQ/50ML IV SOLN
10.0000 meq | INTRAVENOUS | Status: AC
  Administered 2017-04-01 (×3): 10 meq via INTRAVENOUS
  Filled 2017-04-01 (×3): qty 50

## 2017-04-01 MED ORDER — METOPROLOL TARTRATE 25 MG/10 ML ORAL SUSPENSION: 12.5000 mg | Freq: Two times a day (BID) | ORAL | Status: DC

## 2017-04-01 MED ORDER — CHLORHEXIDINE GLUCONATE 0.12% ORAL RINSE (MEDLINE KIT)
15.0000 mL | Freq: Two times a day (BID) | OROMUCOSAL | Status: DC
  Administered 2017-04-01: 15 mL via OROMUCOSAL

## 2017-04-01 MED ORDER — SODIUM CHLORIDE 0.9 % IV SOLN: INTRAVENOUS | Status: DC

## 2017-04-01 MED ORDER — PROTAMINE SULFATE 10 MG/ML IV SOLN
INTRAVENOUS | Status: AC
  Filled 2017-04-01: qty 25

## 2017-04-01 MED ORDER — ALBUMIN HUMAN 5 % IV SOLN
250.0000 mL | INTRAVENOUS | Status: AC | PRN
  Administered 2017-04-01 (×4): 250 mL via INTRAVENOUS
  Filled 2017-04-01 (×2): qty 250

## 2017-04-01 MED ORDER — METOPROLOL TARTRATE 12.5 MG HALF TABLET
12.5000 mg | ORAL_TABLET | Freq: Two times a day (BID) | ORAL | Status: DC
  Administered 2017-04-02 – 2017-04-05 (×3): 12.5 mg via ORAL
  Filled 2017-04-01 (×3): qty 1

## 2017-04-01 MED ORDER — FAMOTIDINE IN NACL 20-0.9 MG/50ML-% IV SOLN
20.0000 mg | Freq: Two times a day (BID) | INTRAVENOUS | Status: AC
  Administered 2017-04-01 (×2): 20 mg via INTRAVENOUS
  Filled 2017-04-01: qty 50

## 2017-04-01 MED ORDER — CHLORHEXIDINE GLUCONATE 0.12 % MT SOLN
15.0000 mL | OROMUCOSAL | Status: AC
  Administered 2017-04-01: 15 mL via OROMUCOSAL

## 2017-04-01 MED ORDER — HEMOSTATIC AGENTS (NO CHARGE) OPTIME
TOPICAL | Status: DC | PRN
  Administered 2017-04-01 (×2): 1 via TOPICAL

## 2017-04-01 MED ORDER — DOPAMINE-DEXTROSE 3.2-5 MG/ML-% IV SOLN: 3.0000 ug/kg/min | INTRAVENOUS | Status: AC

## 2017-04-01 MED FILL — Heparin Sodium (Porcine) Inj 1000 Unit/ML: INTRAMUSCULAR | Qty: 10 | Status: AC

## 2017-04-01 MED FILL — Electrolyte-R (PH 7.4) Solution: INTRAVENOUS | Qty: 3000 | Status: AC

## 2017-04-01 MED FILL — Sodium Bicarbonate IV Soln 8.4%: INTRAVENOUS | Qty: 50 | Status: AC

## 2017-04-01 MED FILL — Heparin Sodium (Porcine) Inj 1000 Unit/ML: INTRAMUSCULAR | Qty: 30 | Status: AC

## 2017-04-01 MED FILL — Mannitol IV Soln 20%: INTRAVENOUS | Qty: 500 | Status: AC

## 2017-04-01 MED FILL — Lidocaine HCl IV Inj 20 MG/ML: INTRAVENOUS | Qty: 5 | Status: AC

## 2017-04-01 MED FILL — Potassium Chloride Inj 2 mEq/ML: INTRAVENOUS | Qty: 5 | Status: AC

## 2017-04-01 MED FILL — Sodium Chloride IV Soln 0.9%: INTRAVENOUS | Qty: 2000 | Status: AC

## 2017-04-01 MED FILL — Magnesium Sulfate Inj 50%: INTRAMUSCULAR | Qty: 10 | Status: AC

## 2017-04-01 SURGICAL SUPPLY — 82 items
ADH SKN CLS APL DERMABOND .7 (GAUZE/BANDAGES/DRESSINGS) ×2
AGENT HMST KT MTR STRL THRMB (HEMOSTASIS) ×2
APL SKNCLS STERI-STRIP NONHPOA (GAUZE/BANDAGES/DRESSINGS) ×2
BAG DECANTER FOR FLEXI CONT (MISCELLANEOUS) ×3 IMPLANT
BANDAGE ACE 4X5 VEL STRL LF (GAUZE/BANDAGES/DRESSINGS) ×1 IMPLANT
BANDAGE ACE 6X5 VEL STRL LF (GAUZE/BANDAGES/DRESSINGS) ×2 IMPLANT
BANDAGE ELASTIC 4 VELCRO ST LF (GAUZE/BANDAGES/DRESSINGS) ×2 IMPLANT
BANDAGE ELASTIC 6 VELCRO ST LF (GAUZE/BANDAGES/DRESSINGS) ×1 IMPLANT
BENZOIN TINCTURE PRP APPL 2/3 (GAUZE/BANDAGES/DRESSINGS) ×1 IMPLANT
BLADE STERNUM SYSTEM 6 (BLADE) ×3 IMPLANT
BLADE SURG 11 STRL SS (BLADE) ×1 IMPLANT
BNDG GAUZE ELAST 4 BULKY (GAUZE/BANDAGES/DRESSINGS) ×3 IMPLANT
CANISTER SUCT 3000ML PPV (MISCELLANEOUS) ×3 IMPLANT
CANNULA VEN 2 STAGE (MISCELLANEOUS) ×1 IMPLANT
CATH CPB KIT GERHARDT (MISCELLANEOUS) ×3 IMPLANT
CATH THORACIC 28FR (CATHETERS) ×3 IMPLANT
CHLORAPREP W/TINT 10.5 ML (MISCELLANEOUS) ×1 IMPLANT
CLIP TI WIDE RED SMALL 24 (CLIP) ×1 IMPLANT
CRADLE DONUT ADULT HEAD (MISCELLANEOUS) ×3 IMPLANT
DERMABOND ADVANCED (GAUZE/BANDAGES/DRESSINGS) ×1
DERMABOND ADVANCED .7 DNX12 (GAUZE/BANDAGES/DRESSINGS) IMPLANT
DRAIN CHANNEL 28F RND 3/8 FF (WOUND CARE) ×3 IMPLANT
DRAPE CARDIOVASCULAR INCISE (DRAPES) ×3
DRAPE SLUSH/WARMER DISC (DRAPES) ×3 IMPLANT
DRAPE SRG 135X102X78XABS (DRAPES) ×2 IMPLANT
DRSG AQUACEL AG ADV 3.5X14 (GAUZE/BANDAGES/DRESSINGS) ×3 IMPLANT
ELECT BLADE 4.0 EZ CLEAN MEGAD (MISCELLANEOUS) ×3
ELECT REM PT RETURN 9FT ADLT (ELECTROSURGICAL) ×6
ELECTRODE BLDE 4.0 EZ CLN MEGD (MISCELLANEOUS) ×2 IMPLANT
ELECTRODE REM PT RTRN 9FT ADLT (ELECTROSURGICAL) ×4 IMPLANT
ELECTROSURGICAL PENCIL ×3 IMPLANT
FELT TEFLON 1X6 (MISCELLANEOUS) ×5 IMPLANT
GAUZE SPONGE 4X4 12PLY STRL (GAUZE/BANDAGES/DRESSINGS) ×2 IMPLANT
GAUZE SPONGE 4X4 12PLY STRL LF (GAUZE/BANDAGES/DRESSINGS) ×2 IMPLANT
GLOVE BIO SURGEON STRL SZ 6.5 (GLOVE) ×15 IMPLANT
GLOVE BIO SURGEON STRL SZ8.5 (GLOVE) ×2 IMPLANT
GOWN STRL REUS W/ TWL LRG LVL3 (GOWN DISPOSABLE) ×8 IMPLANT
GOWN STRL REUS W/TWL LRG LVL3 (GOWN DISPOSABLE) ×18
HEMOSTAT POWDER SURGIFOAM 1G (HEMOSTASIS) ×9 IMPLANT
HEMOSTAT SURGICEL 2X14 (HEMOSTASIS) ×3 IMPLANT
KIT BASIN OR (CUSTOM PROCEDURE TRAY) ×3 IMPLANT
KIT CATH SUCT 8FR (CATHETERS) ×3 IMPLANT
KIT DRAINAGE VACCUM ASSIST (KITS) ×1 IMPLANT
KIT ROOM TURNOVER OR (KITS) ×3 IMPLANT
KIT SUCTION CATH 14FR (SUCTIONS) ×6 IMPLANT
KIT VASOVIEW HEMOPRO VH 3000 (KITS) ×3 IMPLANT
LEAD PACING MYOCARDI (MISCELLANEOUS) ×3 IMPLANT
MARKER GRAFT CORONARY BYPASS (MISCELLANEOUS) ×9 IMPLANT
NS IRRIG 1000ML POUR BTL (IV SOLUTION) ×17 IMPLANT
PACK OPEN HEART (CUSTOM PROCEDURE TRAY) ×3 IMPLANT
PAD ARMBOARD 7.5X6 YLW CONV (MISCELLANEOUS) ×6 IMPLANT
PAD ELECT DEFIB RADIOL ZOLL (MISCELLANEOUS) ×3 IMPLANT
PENCIL BUTTON HOLSTER BLD 10FT (ELECTRODE) ×2 IMPLANT
PUNCH AORTIC ROTATE  4.5MM 8IN (MISCELLANEOUS) ×1 IMPLANT
SET CARDIOPLEGIA MPS 5001102 (MISCELLANEOUS) ×1 IMPLANT
SPONGE LAP 18X18 X RAY DECT (DISPOSABLE) ×6 IMPLANT
SURGIFLO W/THROMBIN 8M KIT (HEMOSTASIS) ×1 IMPLANT
SUT BONE WAX W31G (SUTURE) ×3 IMPLANT
SUT MNCRL AB 4-0 PS2 18 (SUTURE) ×1 IMPLANT
SUT PROLENE 3 0 SH1 36 (SUTURE) ×4 IMPLANT
SUT PROLENE 4 0 TF (SUTURE) ×6 IMPLANT
SUT PROLENE 6 0 CC (SUTURE) ×10 IMPLANT
SUT PROLENE 7 0 BV1 MDA (SUTURE) ×3 IMPLANT
SUT PROLENE 7.0 RB 3 (SUTURE) ×4 IMPLANT
SUT STEEL 6MS V (SUTURE) ×3 IMPLANT
SUT STEEL SZ 6 DBL 3X14 BALL (SUTURE) ×3 IMPLANT
SUT VIC AB 1 CTX 18 (SUTURE) ×6 IMPLANT
SUT VIC AB 2-0 CT1 27 (SUTURE) ×3
SUT VIC AB 2-0 CT1 TAPERPNT 27 (SUTURE) IMPLANT
SUTURE E-PAK OPEN HEART (SUTURE) ×3 IMPLANT
SYSTEM SAHARA CHEST DRAIN ATS (WOUND CARE) ×3 IMPLANT
TAPE CLOTH SURG 4X10 WHT LF (GAUZE/BANDAGES/DRESSINGS) ×4 IMPLANT
TAPE PAPER 3X10 WHT MICROPORE (GAUZE/BANDAGES/DRESSINGS) ×3 IMPLANT
TOWEL GREEN STERILE (TOWEL DISPOSABLE) ×9 IMPLANT
TOWEL GREEN STERILE FF (TOWEL DISPOSABLE) ×5 IMPLANT
TOWEL OR 17X24 6PK STRL BLUE (TOWEL DISPOSABLE) ×4 IMPLANT
TOWEL OR 17X26 10 PK STRL BLUE (TOWEL DISPOSABLE) ×4 IMPLANT
TRANSDUCER COVER CIVFLEX 3.5 (MISCELLANEOUS) ×1 IMPLANT
TRAY FOLEY SILVER 16FR TEMP (SET/KITS/TRAYS/PACK) ×3 IMPLANT
TUBING INSUFFLATION (TUBING) ×3 IMPLANT
UNDERPAD 30X30 (UNDERPADS AND DIAPERS) ×3 IMPLANT
WATER STERILE IRR 1000ML POUR (IV SOLUTION) ×6 IMPLANT

## 2017-04-01 NOTE — OR Nursing (Signed)
[facility] in to turn pt's ICD back on.

## 2017-04-01 NOTE — OR Nursing (Signed)
13:00 - 45 minute call to SICU charge nurse

## 2017-04-01 NOTE — Anesthesia Procedure Notes (Signed)
Arterial Line Insertion Start/End5/01/2017 7:30 AM, 04/01/2017 7:45 AM Performed by: Teressa Lower, CRNA  Preanesthetic checklist: patient identified, IV checked, site marked, risks and benefits discussed, surgical consent, monitors and equipment checked, pre-op evaluation and timeout performed Lidocaine 1% used for infiltration and patient sedated Left, radial was placed Catheter size: 20 G Hand hygiene performed , maximum sterile barriers used  and Seldinger technique used Allen's test indicative of satisfactory collateral circulation Attempts: 2 Procedure performed without using ultrasound guided technique. Patient tolerated the procedure well with no immediate complications.

## 2017-04-01 NOTE — Progress Notes (Signed)
Patient ID: Steven Everett, male   DOB: [birth year], 82 y.o.   MRN: [id number] Pre Procedure note for inpatients:   Steven Everett has been scheduled for Procedure(s): CORONARY ARTERY BYPASS GRAFTING (CABG) (N/A) TRANSESOPHAGEAL ECHOCARDIOGRAM (TEE) (N/A) today. The various methods of treatment have been discussed with the patient. After consideration of the risks, benefits and treatment options the patient has consented to the planned procedure.   The patient has been seen and labs reviewed. There are no changes in the patient's condition to prevent proceeding with the planned procedure today.  Recent labs:  Lab Results  Component Value Date   WBC 5.4 04/01/2017   HGB 14.7 04/01/2017   HCT 45.9 04/01/2017   PLT 142 (L) 04/01/2017   GLUCOSE 98 04/01/2017   CHOL 114 10/21/2016   TRIG 58 10/21/2016   HDL 49 10/21/2016   LDLDIRECT 57 04/23/2011   LDLCALC 53 10/21/2016   ALT 23 10/21/2016   AST 26 10/21/2016   NA 138 04/01/2017   K 5.2 (H) 04/01/2017   CL 103 04/01/2017   CREATININE 1.14 04/01/2017   BUN 13 04/01/2017   CO2 28 04/01/2017   TSH 1.56 01/09/2017   PSA 1.42 11/16/2014   INR 1.36 03/31/2017   HGBA1C 5.6 10/21/2016   MICROALBUR 10 05/15/2015    Grace Isaac, MD 04/01/2017 8:19 AM

## 2017-04-01 NOTE — Anesthesia Procedure Notes (Signed)
Central Venous Catheter Insertion Performed by: Roberts Gaudy, anesthesiologist Preanesthetic checklist: patient identified, IV checked, site marked, risks and benefits discussed, surgical consent, monitors and equipment checked, pre-op evaluation and timeout performed Position: supine Hand hygiene performed , maximum sterile barriers used  and Seldinger technique used Catheter size: 8 Fr Central line was placed.Double lumen Procedure performed using ultrasound guided technique. Ultrasound Notes:anatomy identified Attempts: 1 Following insertion, line sutured and dressing applied. Post procedure assessment: blood return through all ports  Patient tolerated the procedure well with no immediate complications.

## 2017-04-01 NOTE — Transfer of Care (Signed)
Immediate Anesthesia Transfer of Care Note  Patient: Steven Everett  Procedure(s) Performed: Procedure(s): CORONARY ARTERY BYPASS GRAFTING (CABG) x 2 (LIMA to DIAGONAL and SVG to RCA) WITH ENDOSCOPIC HARVESTING OF RIGHT SAPHENOUS VEIN (N/A) TRANSESOPHAGEAL ECHOCARDIOGRAM (TEE) (N/A)  Patient Location: SICU  Anesthesia Type:General  Level of Consciousness: unresponsive and Patient remains intubated per anesthesia plan  Airway & Oxygen Therapy: Patient remains intubated per anesthesia plan  Post-op Assessment: Report given to RN and Post -op Vital signs reviewed and stable  Post vital signs: Reviewed and stable  Last Vitals:  Vitals:   03/31/17 2116 04/01/17 0509  BP: 113/64 116/61  Pulse: 78 68  Resp: 18   Temp: 36.5 C     Last Pain:  Vitals:   03/31/17 1146  TempSrc: Oral  PainSc:          Complications: No apparent anesthesia complications

## 2017-04-01 NOTE — Anesthesia Procedure Notes (Signed)
Central Venous Catheter Insertion Performed by: Roberts Gaudy Start/End5/01/2017 8:15 AM, 04/01/2017 8:20 AM Preanesthetic checklist: patient identified, IV checked, site marked, risks and benefits discussed, surgical consent, monitors and equipment checked, pre-op evaluation and timeout performed Position: supine Hand hygiene performed , maximum sterile barriers used  and Seldinger technique used Catheter size: 8.5 Fr Pacing wire was placed.Procedure performed using ultrasound guided technique. Ultrasound Notes:anatomy identified, needle tip was noted to be adjacent to the nerve/plexus identified and no ultrasound evidence of intravascular and/or intraneural injection Attempts: 1 Following insertion, line sutured, dressing applied and Biopatch. Post procedure assessment: free fluid flow, blood return through all ports and no air  Patient tolerated the procedure well with no immediate complications.

## 2017-04-01 NOTE — Anesthesia Postprocedure Evaluation (Signed)
Anesthesia Post Note  Patient: Steven Everett  Procedure(s) Performed: Procedure(s) (LRB): CORONARY ARTERY BYPASS GRAFTING (CABG) x 2 (LIMA to DIAGONAL and SVG to RCA) WITH ENDOSCOPIC HARVESTING OF RIGHT SAPHENOUS VEIN (N/A) TRANSESOPHAGEAL ECHOCARDIOGRAM (TEE) (N/A)  Patient location during evaluation: SICU Anesthesia Type: General Level of consciousness: sedated Pain management: pain level controlled Vital Signs Assessment: post-procedure vital signs reviewed and stable Respiratory status: patient remains intubated per anesthesia plan Cardiovascular status: stable Anesthetic complications: no       Last Vitals:  Vitals:   04/01/17 1500 04/01/17 1515  BP: 123/68   Pulse: 92 92  Resp: 12 12  Temp: 36.3 C 36.3 C    Last Pain:  Vitals:   04/01/17 1405  TempSrc: Core (Comment)  PainSc:                  Steven Everett

## 2017-04-01 NOTE — Progress Notes (Signed)
TCTS BRIEF SICU PROGRESS NOTE  Day of Surgery  S/P Procedure(s) (LRB): CORONARY ARTERY BYPASS GRAFTING (CABG) x 2 (LIMA to DIAGONAL and SVG to RCA) WITH ENDOSCOPIC HARVESTING OF RIGHT SAPHENOUS VEIN (N/A) TRANSESOPHAGEAL ECHOCARDIOGRAM (TEE) (N/A)   Sedated on vent NSR w/ stable hemodynamics on low dose milrinone, dopamine and neo drips O2 sats 100% Chest tube output low UOP > 125 mL/hr Labs okay  Plan: Continue routine early postop  Rexene Alberts, MD 04/01/2017 5:56 PM

## 2017-04-01 NOTE — Consult Note (Signed)
           [facility] CM Primary Care Navigator  04/01/2017  Steven Everett [birth year] [id number]   Went to see patient at the bedside to identify possible discharge needs but staff reports that patient is in the OR (CABG) at this moment. Staff states that patient may not come back to the unit after surgery.  Will attempt to see patient at another time when available at his new location.  For questions, please contact:  Dannielle Huh, BSN, RN- [facility] Primary Care Navigator  Telephone: [phone] [place]

## 2017-04-01 NOTE — Brief Op Note (Addendum)
03/30/2017 - 04/01/2017  12:02 PM  PATIENT:  Steven Everett  82 y.o. male  PRE-OPERATIVE DIAGNOSIS:  1. S/p NSTEMI 2.CAD  POST-OPERATIVE DIAGNOSIS:  1. 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   PROCEDURE: TRANSESOPHAGEAL ECHOCARDIOGRAM (TEE), MEDIAN STERNOTOMY for CORONARY ARTERY BYPASS GRAFTING (CABG) x 2 (LIMA to DIAGONAL and SVG to RCA) WITH ENDOSCOPIC HARVESTING OF RIGHT THIGH GREATER SAPHENOUS VEIN    SURGEON:  Surgeon(s) and Role:    * Grace Isaac, MD - Primary  PHYSICIAN ASSISTANT: Lars Pinks PA-C  ANESTHESIA:   general  EBL:  Total I/O In: 1000 [I.V.:1000] Out: 125 [Urine:125]   DRAINS: Chest tubes placed in the mediastinal and  Left pleural spaces  DICTATION: .Dragon Dictation  PLAN OF CARE: Admit to inpatient   PATIENT DISPOSITION:  ICU - intubated and hemodynamically stable.   Delay start of Pharmacological VTE agent (>24hrs) due to surgical blood loss or risk of bleeding: yes  BASELINE WEIGHT: 81.8 kg

## 2017-04-01 NOTE — Procedures (Signed)
Extubation Procedure Note  Patient Details:   Name: Steven Everett DOB: August 21, 1935 MRN: [id number]   Airway Documentation:     Evaluation  O2 sats: stable throughout Complications: No apparent complications Patient did tolerate procedure well. Bilateral Breath Sounds: Clear, Diminished   Yes   Patient extubated to a 4LNC without any complications. Patient has an adequate cough and is able to speak clearly.  Blanchie Serve 04/01/2017, 9:41 PM

## 2017-04-01 NOTE — Anesthesia Procedure Notes (Signed)
Procedure Name: Intubation Date/Time: 04/01/2017 8:52 AM Performed by: Teressa Lower Pre-anesthesia Checklist: Patient identified, Emergency Drugs available, Suction available, Patient being monitored and Timeout performed Patient Re-evaluated:Patient Re-evaluated prior to inductionOxygen Delivery Method: Circle system utilized Preoxygenation: Pre-oxygenation with 100% oxygen Intubation Type: IV induction Ventilation: Mask ventilation without difficulty Laryngoscope Size: Mac and 4 Grade View: Grade I Tube type: Oral Tube size: 8.0 mm Number of attempts: 1 Airway Equipment and Method: Stylet and Oral airway Placement Confirmation: ETT inserted through vocal cords under direct vision,  positive ETCO2 and breath sounds checked- equal and bilateral Secured at: 23 cm Tube secured with: Tape Dental Injury: Teeth and Oropharynx as per pre-operative assessment

## 2017-04-01 NOTE — Progress Notes (Signed)
NIF -30, VC 1.2L

## 2017-04-01 NOTE — Anesthesia Preprocedure Evaluation (Signed)
Anesthesia Evaluation  Patient identified by MRN, date of birth, ID band Patient awake    Reviewed: Allergy & Precautions, NPO status , Patient's Chart, lab work & pertinent test results  History of Anesthesia Complications Negative for: history of anesthetic complications  Airway Mallampati: II  TM Distance: >3 FB Neck ROM: Full    Dental  (+) Dental Advisory Given   Pulmonary former smoker,    breath sounds clear to auscultation       Cardiovascular hypertension, Pt. on medications + CAD and + Past MI   Rhythm:Regular     Neuro/Psych negative neurological ROS  negative psych ROS   GI/Hepatic GERD  ,  Endo/Other  diabetesHypothyroidism   Renal/GU Renal disease     Musculoskeletal   Abdominal   Peds  Hematology   Anesthesia Other Findings   Reproductive/Obstetrics                             Anesthesia Physical Anesthesia Plan  ASA: IV  Anesthesia Plan: General   Post-op Pain Management:    Induction: Intravenous  Airway Management Planned: Oral ETT  Additional Equipment: Arterial line, TEE, CVP, PA Cath and Ultrasound Guidance Line Placement  Intra-op Plan:   Post-operative Plan: Post-operative intubation/ventilation  Informed Consent: I have reviewed the patients History and Physical, chart, labs and discussed the procedure including the risks, benefits and alternatives for the proposed anesthesia with the patient or authorized representative who has indicated his/her understanding and acceptance.   Dental advisory given  Plan Discussed with: CRNA and Surgeon  Anesthesia Plan Comments:         Anesthesia Quick Evaluation

## 2017-04-02 ENCOUNTER — Inpatient Hospital Stay (HOSPITAL_COMMUNITY): Payer: Medicare Other

## 2017-04-02 ENCOUNTER — Encounter (HOSPITAL_COMMUNITY): Payer: Self-pay | Admitting: Cardiothoracic Surgery

## 2017-04-02 LAB — COOXEMETRY PANEL
CARBOXYHEMOGLOBIN: 1.2 % (ref 0.5–1.5)
Carboxyhemoglobin: 1.2 % (ref 0.5–1.5)
Methemoglobin: 1.2 % (ref 0.0–1.5)
Methemoglobin: 1.2 % (ref 0.0–1.5)
O2 Saturation: 66.4 %
O2 Saturation: 74 %
TOTAL HEMOGLOBIN: 11.4 g/dL — AB (ref 12.0–16.0)
Total hemoglobin: 10.8 g/dL — ABNORMAL LOW (ref 12.0–16.0)

## 2017-04-02 LAB — GLUCOSE, CAPILLARY
Glucose-Capillary: 107 mg/dL — ABNORMAL HIGH (ref 65–99)
Glucose-Capillary: 109 mg/dL — ABNORMAL HIGH (ref 65–99)
Glucose-Capillary: 109 mg/dL — ABNORMAL HIGH (ref 65–99)
Glucose-Capillary: 111 mg/dL — ABNORMAL HIGH (ref 65–99)
Glucose-Capillary: 113 mg/dL — ABNORMAL HIGH (ref 65–99)
Glucose-Capillary: 115 mg/dL — ABNORMAL HIGH (ref 65–99)
Glucose-Capillary: 118 mg/dL — ABNORMAL HIGH (ref 65–99)
Glucose-Capillary: 118 mg/dL — ABNORMAL HIGH (ref 65–99)
Glucose-Capillary: 120 mg/dL — ABNORMAL HIGH (ref 65–99)
Glucose-Capillary: 120 mg/dL — ABNORMAL HIGH (ref 65–99)
Glucose-Capillary: 123 mg/dL — ABNORMAL HIGH (ref 65–99)
Glucose-Capillary: 126 mg/dL — ABNORMAL HIGH (ref 65–99)
Glucose-Capillary: 137 mg/dL — ABNORMAL HIGH (ref 65–99)
Glucose-Capillary: 175 mg/dL — ABNORMAL HIGH (ref 65–99)
Glucose-Capillary: 186 mg/dL — ABNORMAL HIGH (ref 65–99)
Glucose-Capillary: 81 mg/dL (ref 65–99)

## 2017-04-02 LAB — CBC
HCT: 33.8 % — ABNORMAL LOW (ref 39.0–52.0)
HCT: 30.2 % — ABNORMAL LOW (ref 39.0–52.0)
HEMOGLOBIN: 10.9 g/dL — AB (ref 13.0–17.0)
HEMOGLOBIN: 9.7 g/dL — AB (ref 13.0–17.0)
MCH: 27.5 pg (ref 26.0–34.0)
MCH: 27.7 pg (ref 26.0–34.0)
MCHC: 32.2 g/dL (ref 30.0–36.0)
MCHC: 32.1 g/dL (ref 30.0–36.0)
MCV: 85.1 fL (ref 78.0–100.0)
MCV: 86.3 fL (ref 78.0–100.0)
PLATELETS: 88 10*3/uL — AB (ref 150–400)
Platelets: 81 10*3/uL — ABNORMAL LOW (ref 150–400)
RBC: 3.97 MIL/uL — AB (ref 4.22–5.81)
RBC: 3.5 MIL/uL — ABNORMAL LOW (ref 4.22–5.81)
RDW: 14.2 % (ref 11.5–15.5)
RDW: 14.7 % (ref 11.5–15.5)
WBC: 11.4 10*3/uL — AB (ref 4.0–10.5)
WBC: 11.4 10*3/uL — ABNORMAL HIGH (ref 4.0–10.5)

## 2017-04-02 LAB — ECHO TEE
AO mean calculated velocity dopler: 168 cm/s
AV Area VTI index: 0.68 cm2/m2
AV Area VTI: 1.34 cm2
AV Area mean vel: 1.37 cm2
AV Mean grad: 13 mmHg
AV Peak grad: 22 mmHg
AV VEL mean LVOT/AV: 0.3
AV area mean vel ind: 0.67 cm2/m2
AV peak Index: 0.65
AV pk vel: 233 cm/s
AV vel: 1.4
Ao pk vel: 0.3 m/s
LVOT MV VTI INDEX: 1.61 cm2/m2
LVOT MV VTI: 3.31
LVOT SV: 72 mL
LVOT VTI: 15.9 cm
LVOT area: 4.52 cm2
LVOT diameter: 24 mm
LVOT peak VTI: 0.31 cm
LVOT peak vel: 69 cm/s
MV Annulus VTI: 21.7 cm
MV M vel: 58.2
Mean grad: 2 mmHg
P 1/2 time: 426 ms
VTI: 51.3 cm
Valve area index: 0.68
Valve area: 1.4 cm2

## 2017-04-02 LAB — CREATININE, SERUM
CREATININE: 1.45 mg/dL — AB (ref 0.61–1.24)
GFR calc Af Amer: 51 mL/min — ABNORMAL LOW (ref >60)
GFR, EST NON AFRICAN AMERICAN: 44 mL/min — AB (ref >60)

## 2017-04-02 LAB — BASIC METABOLIC PANEL
ANION GAP: 8 (ref 5–15)
BUN: 12 mg/dL (ref 6–20)
CHLORIDE: 108 mmol/L (ref 101–111)
CO2: 21 mmol/L — ABNORMAL LOW (ref 22–32)
Calcium: 8.4 mg/dL — ABNORMAL LOW (ref 8.9–10.3)
Creatinine, Ser: 0.92 mg/dL (ref 0.61–1.24)
Glucose, Bld: 119 mg/dL — ABNORMAL HIGH (ref 65–99)
POTASSIUM: 4.1 mmol/L (ref 3.5–5.1)
SODIUM: 137 mmol/L (ref 135–145)

## 2017-04-02 LAB — POCT I-STAT, CHEM 8
BUN: 17 mg/dL (ref 6–20)
Calcium, Ion: 1.22 mmol/L (ref 1.15–1.40)
Chloride: 104 mmol/L (ref 101–111)
Creatinine, Ser: 1.3 mg/dL — ABNORMAL HIGH (ref 0.61–1.24)
Glucose, Bld: 237 mg/dL — ABNORMAL HIGH (ref 65–99)
HCT: 30 % — ABNORMAL LOW (ref 39.0–52.0)
Hemoglobin: 10.2 g/dL — ABNORMAL LOW (ref 13.0–17.0)
Potassium: 4.1 mmol/L (ref 3.5–5.1)
Sodium: 138 mmol/L (ref 135–145)
TCO2: 21 mmol/L (ref 0–100)

## 2017-04-02 LAB — MAGNESIUM
MAGNESIUM: 2.3 mg/dL (ref 1.7–2.4)
MAGNESIUM: 2 mg/dL (ref 1.7–2.4)

## 2017-04-02 MED ORDER — AMIODARONE HCL IN DEXTROSE 360-4.14 MG/200ML-% IV SOLN
30.0000 mg/h | INTRAVENOUS | Status: DC
  Administered 2017-04-03 (×2): 30 mg/h via INTRAVENOUS
  Filled 2017-04-02 (×4): qty 200

## 2017-04-02 MED ORDER — CALCIUM CHLORIDE 10 % IV SOLN
1.0000 g | Freq: Once | INTRAVENOUS | Status: AC
  Administered 2017-04-02: 1 g via INTRAVENOUS

## 2017-04-02 MED ORDER — ALBUMIN HUMAN 5 % IV SOLN
12.5000 g | Freq: Once | INTRAVENOUS | Status: AC
  Administered 2017-04-02: 12.5 g via INTRAVENOUS

## 2017-04-02 MED ORDER — AMIODARONE HCL IN DEXTROSE 360-4.14 MG/200ML-% IV SOLN
60.0000 mg/h | INTRAVENOUS | Status: AC
  Administered 2017-04-02: 60 mg/h via INTRAVENOUS
  Filled 2017-04-02: qty 200

## 2017-04-02 MED ORDER — DOPAMINE-DEXTROSE 3.2-5 MG/ML-% IV SOLN
INTRAVENOUS | Status: AC
  Administered 2017-04-02: 3 mg
  Filled 2017-04-02: qty 250

## 2017-04-02 MED ORDER — ALBUMIN HUMAN 5 % IV SOLN
INTRAVENOUS | Status: AC
  Filled 2017-04-02: qty 250

## 2017-04-02 MED ORDER — INSULIN ASPART 100 UNIT/ML ~~LOC~~ SOLN
0.0000 [IU] | SUBCUTANEOUS | Status: DC
  Administered 2017-04-02: 2 [IU] via SUBCUTANEOUS
  Administered 2017-04-02 (×2): 4 [IU] via SUBCUTANEOUS
  Administered 2017-04-03: 2 [IU] via SUBCUTANEOUS
  Administered 2017-04-03: 4 [IU] via SUBCUTANEOUS
  Administered 2017-04-03: 2 [IU] via SUBCUTANEOUS
  Administered 2017-04-03: 8 [IU] via SUBCUTANEOUS

## 2017-04-02 NOTE — Progress Notes (Signed)
      [address]       [place] [address]             [phone]      PM rounds  Sleeping currently  POD # 1 CABG  BP (!) 88/48   Pulse 84   Temp 98.6 F (37 C) (Oral)   Resp (!) 22   Ht '6\' 1"'$  (1.854 m)   Wt 191 lb 2.2 oz (86.7 kg)   SpO2 100%   BMI 25.22 kg/m    Intake/Output Summary (Last 24 hours) at 04/02/17 1812 Last data filed at 04/02/17 1400  Gross per 24 hour  Intake          3192.39 ml  Output             1495 ml  Net          1697.39 ml   Creatinine= 1.3, Hct= 30  Doing well POD # 1  Steven Everett C. Roxan Hockey, MD [facility] [phone]

## 2017-04-02 NOTE — Progress Notes (Signed)
Patient's blood pressure was declining and informed Dr. Roxan Hockey. Stated to restart the Dopamine infusion. Dr. Servando Snare came in as well and stated to decrease the milrinone. Patient's heart rate increased so gave prn medications. Will continue to monitor.

## 2017-04-02 NOTE — Consult Note (Signed)
           [facility] CM Primary Care Navigator  04/02/2017  Steven Everett Dec 25, 1934 [id number]   Attempted to go back andsee patientat the bedside to identify possible discharge needsbut staff statesthat patient was transferred to [facility](ICU) after yesterday's surgery.   Will attempt to meet with patient at another time, when transferred out of ICU.   For questions, please contact:  Dannielle Huh, BSN, RN- [facility] Primary Care Navigator  Telephone: [phone] [place]

## 2017-04-02 NOTE — Progress Notes (Addendum)
Patient ID: SHMIEL MORTON, male   DOB: 03/03/1935, 82 y.o.   MRN: [id number] TCTS DAILY ICU PROGRESS NOTE                   [address]            [place] [address]          [phone]   1 Day Post-Op Procedure(s) (LRB): CORONARY ARTERY BYPASS GRAFTING (CABG) x 2 (LIMA to DIAGONAL and SVG to RCA) WITH ENDOSCOPIC HARVESTING OF RIGHT SAPHENOUS VEIN (N/A) TRANSESOPHAGEAL ECHOCARDIOGRAM (TEE) (N/A)  Total Length of Stay:  LOS: 2 days   Subjective: Alert, extubated last pm, awake and neuro intact   Objective: Vital signs in last 24 hours: Temp:  [96.6 F (35.9 C)-99.3 F (37.4 C)] 99.3 F (37.4 C) (05/03 0645) Pulse Rate:  [75-102] 85 (05/03 0645) Cardiac Rhythm: Atrial fibrillation (05/03 0400) Resp:  [0-28] 17 (05/03 0645) BP: (92-125)/(51-72) 118/56 (05/03 0600) SpO2:  [86 %-100 %] 100 % (05/03 0645) Arterial Line BP: (76-163)/(34-63) 106/45 (05/03 0645) FiO2 (%):  [40 %-50 %] 40 % (05/02 2045) Weight:  [191 lb 2.2 oz (86.7 kg)] 191 lb 2.2 oz (86.7 kg) (05/03 0500)  Filed Weights   03/31/17 0102 03/31/17 0831 04/02/17 0500  Weight: 180 lb 5.4 oz (81.8 kg) 180 lb 5.4 oz (81.8 kg) 191 lb 2.2 oz (86.7 kg)    Weight change: 10 lb 12.8 oz (4.9 kg)   Hemodynamic parameters for last 24 hours: PAP: (20-41)/(5-18) 29/11 CO:  [4 L/min-6.3 L/min] 6.3 L/min CI:  [1.9 L/min/m2-3.1 L/min/m2] 3.1 L/min/m2  Intake/Output from previous day: 05/02 0701 - 05/03 0700 In: 7152.6 [I.V.:4627.6; Blood:300; IV Piggyback:2225] Out: 5852 [Urine:2165; Blood:1500; Chest Tube:465]  Intake/Output this shift: Total I/O In: 236.4 [I.V.:236.4] Out: 130 [Urine:100; Chest Tube:30]  Current Meds: Scheduled Meds: . acetaminophen  1,000 mg Oral Q6H   Or  . acetaminophen (TYLENOL) oral liquid 160 mg/5 mL  1,000 mg Per Tube Q6H  . alfuzosin  10 mg Oral Q breakfast  . aspirin EC  325 mg Oral Daily   Or  . aspirin  324 mg Per Tube Daily  . bisacodyl  10 mg Oral Daily   Or  .  bisacodyl  10 mg Rectal Daily  . docusate sodium  200 mg Oral Daily  . finasteride  5 mg Oral Daily  . insulin regular  0-10 Units Intravenous TID WC  . levothyroxine  112 mcg Oral QAC breakfast  . mouth rinse  15 mL Mouth Rinse BID  . metoprolol tartrate  12.5 mg Oral BID   Or  . metoprolol tartrate  12.5 mg Per Tube BID  . [START ON 04/03/2017] pantoprazole  40 mg Oral Daily  . pravastatin  40 mg Oral Daily  . sodium chloride flush  3 mL Intravenous Q12H   Continuous Infusions: . sodium chloride 20 mL/hr at 04/02/17 0800  . sodium chloride    . sodium chloride 20 mL/hr at 04/02/17 0800  . cefUROXime (ZINACEF)  IV Stopped (04/02/17 0436)  . dexmedetomidine (PRECEDEX) IV infusion Stopped (04/01/17 1700)  . DOPamine 3 mcg/kg/min (04/02/17 0800)  . insulin (NOVOLIN-R) infusion 1.3 Units/hr (04/02/17 0817)  . lactated ringers    . lactated ringers Stopped (04/02/17 0500)  . lactated ringers 20 mL/hr at 04/02/17 0800  . milrinone 0.3 mcg/kg/min (04/02/17 0820)  . nitroGLYCERIN Stopped (04/01/17 1516)  . phenylephrine (NEO-SYNEPHRINE) Adult infusion 35.067 mcg/min (04/02/17 0800)   PRN Meds:.sodium  chloride, lactated ringers, levalbuterol, metoprolol, midazolam, morphine injection, ondansetron (ZOFRAN) IV, oxyCODONE, sodium chloride flush, traMADol  General appearance: alert and cooperative Neurologic: intact Heart: irregularly irregular rhythm Lungs: diminished breath sounds bibasilar Abdomen: soft, non-tender; bowel sounds normal; no masses,  no organomegaly Extremities: extremities normal, atraumatic, no cyanosis or edema and Homans sign is negative, no sign of DVT Wound: sternum stable   Lab Results: CBC: Recent Labs  04/01/17 2013 04/01/17 2019 04/02/17 0349  WBC 14.2*  --  11.4*  HGB 12.2* 11.6* 10.9*  HCT 37.1* 34.0* 33.8*  PLT 96*  --  88*   BMET:  Recent Labs  04/01/17 0552  04/01/17 2019 04/02/17 0349  NA 138  < > 140 137  K 5.2*  < > 3.9 4.1  CL 103  < >  109 108  CO2 28  --   --  21*  GLUCOSE 98  < > 141* 119*  BUN 13  < > 13 12  CREATININE 1.14  < > 0.80 0.92  CALCIUM 9.5  --   --  8.4*  < > = values in this interval not displayed.  CMET: Lab Results  Component Value Date   WBC 11.4 (H) 04/02/2017   HGB 10.9 (L) 04/02/2017   HCT 33.8 (L) 04/02/2017   PLT 88 (L) 04/02/2017   GLUCOSE 119 (H) 04/02/2017   CHOL 114 10/21/2016   TRIG 58 10/21/2016   HDL 49 10/21/2016   LDLDIRECT 57 04/23/2011   LDLCALC 53 10/21/2016   ALT 23 10/21/2016   AST 26 10/21/2016   NA 137 04/02/2017   K 4.1 04/02/2017   CL 108 04/02/2017   CREATININE 0.92 04/02/2017   BUN 12 04/02/2017   CO2 21 (L) 04/02/2017   TSH 1.56 01/09/2017   PSA 1.42 11/16/2014   INR 1.75 04/01/2017   HGBA1C 5.6 10/21/2016   MICROALBUR 10 05/15/2015      PT/INR:  Recent Labs  04/01/17 1447  LABPROT 20.6*  INR 1.75   Radiology: Dg Chest Port 1 View  Result Date: 04/02/2017 CLINICAL DATA:  Status post coronary bypass grafting EXAM: PORTABLE CHEST 1 VIEW COMPARISON:  04/01/2017 FINDINGS: Cardiac shadow is stable. Postsurgical changes are again seen. Defibrillator is again noted and stable. Swan-Ganz catheter is noted in the pulmonary outflow tract. Left-sided chest tube and mediastinal drain are seen. Nasogastric catheter is coiled within the stomach. Elevation the right hemidiaphragm is noted with blunting of the right costophrenic angle. No pneumothorax is seen. IMPRESSION: Tubes and lines as described. Chronic changes in the right lung base.  No acute abnormality noted. Electronically Signed   By: Inez Catalina M.D.   On: 04/02/2017 08:03   Dg Chest Port 1 View  Result Date: 04/01/2017 CLINICAL DATA:  Status post CABG EXAM: PORTABLE CHEST 1 VIEW COMPARISON:  Chest CT from yesterday FINDINGS: Endotracheal tube tip at the clavicular heads. An orogastric tube is coiled in the proximal stomach. Thoracic drains present with no visible pneumothorax. Postoperative right chest with  chronic volume loss and pleural based scarring. Swan-Ganz catheter from right IJ approach, tip near the pulmonary outflow tract. There is a separate right IJ catheter with tip at the upper cavoatrial junction. Dual-chamber pacer leads are in stable position. CABG changes. Normal heart size. EKG leads create artifact over the chest. IMPRESSION: Unremarkable positioning of tubes and lines as described above. Postoperative chest without acute finding. Electronically Signed   By: Monte Fantasia M.D.   On: 04/01/2017 14:49  Assessment/Plan: S/P Procedure(s) (LRB): CORONARY ARTERY BYPASS GRAFTING (CABG) x 2 (LIMA to DIAGONAL and SVG to RCA) WITH ENDOSCOPIC HARVESTING OF RIGHT SAPHENOUS VEIN (N/A) TRANSESOPHAGEAL ECHOCARDIOGRAM (TEE) (N/A) Mobilize Diuresis Diabetes control d/c tubes/lines See progression orders Mild thrombocytopenia avoid heparin for now , pas hose for dvt  Expected Acute  Blood - loss Anemia Wean dopamine off then milrinone    Grace Isaac 04/02/2017 8:21 AM

## 2017-04-03 ENCOUNTER — Inpatient Hospital Stay (HOSPITAL_COMMUNITY): Payer: Medicare Other

## 2017-04-03 DIAGNOSIS — I214 Non-ST elevation (NSTEMI) myocardial infarction: Principal | ICD-10-CM

## 2017-04-03 LAB — GLUCOSE, CAPILLARY
Glucose-Capillary: 148 mg/dL — ABNORMAL HIGH (ref 65–99)
Glucose-Capillary: 149 mg/dL — ABNORMAL HIGH (ref 65–99)
Glucose-Capillary: 155 mg/dL — ABNORMAL HIGH (ref 65–99)
Glucose-Capillary: 156 mg/dL — ABNORMAL HIGH (ref 65–99)
Glucose-Capillary: 173 mg/dL — ABNORMAL HIGH (ref 65–99)
Glucose-Capillary: 204 mg/dL — ABNORMAL HIGH (ref 65–99)
Glucose-Capillary: 97 mg/dL (ref 65–99)

## 2017-04-03 LAB — BASIC METABOLIC PANEL
Anion gap: 7 (ref 5–15)
BUN: 20 mg/dL (ref 6–20)
CO2: 24 mmol/L (ref 22–32)
Calcium: 8.9 mg/dL (ref 8.9–10.3)
Chloride: 107 mmol/L (ref 101–111)
Creatinine, Ser: 1.41 mg/dL — ABNORMAL HIGH (ref 0.61–1.24)
GFR calc Af Amer: 53 mL/min — ABNORMAL LOW (ref >60)
GFR calc non Af Amer: 45 mL/min — ABNORMAL LOW (ref >60)
Glucose, Bld: 167 mg/dL — ABNORMAL HIGH (ref 65–99)
Potassium: 4.2 mmol/L (ref 3.5–5.1)
Sodium: 138 mmol/L (ref 135–145)

## 2017-04-03 LAB — COOXEMETRY PANEL
Carboxyhemoglobin: 1.2 % (ref 0.5–1.5)
Carboxyhemoglobin: 1.5 % (ref 0.5–1.5)
Methemoglobin: 0.9 % (ref 0.0–1.5)
Methemoglobin: 0.8 % (ref 0.0–1.5)
O2 SAT: 71.5 %
O2 Saturation: 73.5 %
Total hemoglobin: 9.2 g/dL — ABNORMAL LOW (ref 12.0–16.0)
Total hemoglobin: 10.4 g/dL — ABNORMAL LOW (ref 12.0–16.0)

## 2017-04-03 LAB — CBC
HCT: 30.4 % — ABNORMAL LOW (ref 39.0–52.0)
Hemoglobin: 9.6 g/dL — ABNORMAL LOW (ref 13.0–17.0)
MCH: 27.4 pg (ref 26.0–34.0)
MCHC: 31.6 g/dL (ref 30.0–36.0)
MCV: 86.9 fL (ref 78.0–100.0)
Platelets: 79 10*3/uL — ABNORMAL LOW (ref 150–400)
RBC: 3.5 MIL/uL — ABNORMAL LOW (ref 4.22–5.81)
RDW: 14.7 % (ref 11.5–15.5)
WBC: 12.8 10*3/uL — ABNORMAL HIGH (ref 4.0–10.5)

## 2017-04-03 LAB — POCT I-STAT 3, ART BLOOD GAS (G3+)
Acid-base deficit: 1 mmol/L (ref 0.0–2.0)
Bicarbonate: 23.6 mmol/L (ref 20.0–28.0)
O2 Saturation: 98 %
Patient temperature: 98
TCO2: 25 mmol/L (ref 0–100)
pCO2 arterial: 38.9 mmHg (ref 32.0–48.0)
pH, Arterial: 7.39 (ref 7.350–7.450)
pO2, Arterial: 103 mmHg (ref 83.0–108.0)

## 2017-04-03 MED ORDER — NOREPINEPHRINE BITARTRATE 1 MG/ML IV SOLN
0.0000 ug/min | INTRAVENOUS | Status: DC
  Filled 2017-04-03: qty 4

## 2017-04-03 MED ORDER — ALBUMIN HUMAN 5 % IV SOLN
INTRAVENOUS | Status: AC
  Filled 2017-04-03: qty 250

## 2017-04-03 MED ORDER — NOREPINEPHRINE BITARTRATE 1 MG/ML IV SOLN
0.0000 ug/min | INTRAVENOUS | Status: DC
  Administered 2017-04-03 (×2): 10 ug/min via INTRAVENOUS
  Administered 2017-04-03: 14 ug/min via INTRAVENOUS
  Filled 2017-04-03 (×5): qty 4

## 2017-04-03 MED ORDER — ALBUMIN HUMAN 5 % IV SOLN
12.5000 g | Freq: Once | INTRAVENOUS | Status: AC
  Administered 2017-04-03: 12.5 g via INTRAVENOUS

## 2017-04-03 MED ORDER — NOREPINEPHRINE BITARTRATE 1 MG/ML IV SOLN
0.0000 ug/min | INTRAVENOUS | Status: DC
  Administered 2017-04-03: 2 ug/min via INTRAVENOUS
  Filled 2017-04-03 (×2): qty 4

## 2017-04-03 MED ORDER — INSULIN ASPART 100 UNIT/ML ~~LOC~~ SOLN
0.0000 [IU] | SUBCUTANEOUS | Status: DC
  Administered 2017-04-03 – 2017-04-04 (×3): 2 [IU] via SUBCUTANEOUS
  Administered 2017-04-04: 4 [IU] via SUBCUTANEOUS
  Administered 2017-04-04: 2 [IU] via SUBCUTANEOUS
  Administered 2017-04-04: 4 [IU] via SUBCUTANEOUS
  Administered 2017-04-04 – 2017-04-05 (×3): 2 [IU] via SUBCUTANEOUS

## 2017-04-03 NOTE — Progress Notes (Signed)
PT BP 84/53 MAP 63, second Albumin being administered. Will continue to monitor pt.

## 2017-04-03 NOTE — Progress Notes (Addendum)
Patient ID: Steven Everett, male   DOB: 07-19-1935, 82 y.o.   MRN: [id number] TCTS DAILY ICU PROGRESS NOTE                   [address]            [place] [address]          [phone]   2 Days Post-Op Procedure(s) (LRB): CORONARY ARTERY BYPASS GRAFTING (CABG) x 2 (LIMA to DIAGONAL and SVG to RCA) WITH ENDOSCOPIC HARVESTING OF RIGHT SAPHENOUS VEIN (N/A) TRANSESOPHAGEAL ECHOCARDIOGRAM (TEE) (N/A)  Total Length of Stay:  LOS: 3 days   Subjective: Patient in chair, alert and neuro inatct, bp low during night no back on dopamine  , levophed , neo , milrinone, amniodrone   Objective: Vital signs in last 24 hours: Temp:  [98.4 F (36.9 C)-99.3 F (37.4 C)] 98.4 F (36.9 C) (05/04 0402) Pulse Rate:  [68-104] 88 (05/04 0600) Cardiac Rhythm: Atrial fibrillation (05/04 0400) Resp:  [8-26] 16 (05/04 0600) BP: (74-120)/(46-71) 98/58 (05/04 0600) SpO2:  [96 %-100 %] 100 % (05/04 0600) Arterial Line BP: (63-130)/(29-58) 119/47 (05/04 0600) Weight:  [196 lb 10.4 oz (89.2 kg)] 196 lb 10.4 oz (89.2 kg) (05/04 0500)  Filed Weights   03/31/17 0831 04/02/17 0500 04/03/17 0500  Weight: 180 lb 5.4 oz (81.8 kg) 191 lb 2.2 oz (86.7 kg) 196 lb 10.4 oz (89.2 kg)    Weight change: 5 lb 8.2 oz (2.5 kg)   Hemodynamic parameters for last 24 hours: PAP: (29-33)/(8-9) 29/9  Intake/Output from previous day: 05/03 0701 - 05/04 0700 In: 2671.3 [I.V.:2071.3; IV Piggyback:600] Out: 640 [Urine:475; Chest Tube:165]  Intake/Output this shift: No intake/output data recorded.  Current Meds: Scheduled Meds: . acetaminophen  1,000 mg Oral Q6H   Or  . acetaminophen (TYLENOL) oral liquid 160 mg/5 mL  1,000 mg Per Tube Q6H  . alfuzosin  10 mg Oral Q breakfast  . aspirin EC  325 mg Oral Daily   Or  . aspirin  324 mg Per Tube Daily  . bisacodyl  10 mg Oral Daily   Or  . bisacodyl  10 mg Rectal Daily  . docusate sodium  200 mg Oral Daily  . finasteride  5 mg Oral Daily  . insulin  aspart  0-24 Units Subcutaneous Q4H  . insulin regular  0-10 Units Intravenous TID WC  . levothyroxine  112 mcg Oral QAC breakfast  . mouth rinse  15 mL Mouth Rinse BID  . metoprolol tartrate  12.5 mg Oral BID   Or  . metoprolol tartrate  12.5 mg Per Tube BID  . pantoprazole  40 mg Oral Daily  . pravastatin  40 mg Oral Daily  . sodium chloride flush  3 mL Intravenous Q12H   Continuous Infusions: . sodium chloride 20 mL/hr at 04/02/17 1400  . sodium chloride    . sodium chloride 20 mL/hr at 04/02/17 1400  . amiodarone 30 mg/hr (04/03/17 0530)  . dexmedetomidine (PRECEDEX) IV infusion Stopped (04/01/17 1700)  . insulin (NOVOLIN-R) infusion Stopped (04/02/17 1439)  . lactated ringers    . lactated ringers Stopped (04/02/17 0500)  . lactated ringers 20 mL/hr at 04/02/17 1400  . milrinone 0.3 mcg/kg/min (04/02/17 2156)  . nitroGLYCERIN Stopped (04/01/17 1516)  . norepinephrine (LEVOPHED) Adult infusion 10 mcg/min (04/03/17 0422)  . phenylephrine (NEO-SYNEPHRINE) Adult infusion 60 mcg/min (04/03/17 0600)   PRN Meds:.sodium chloride, lactated ringers, levalbuterol, metoprolol, midazolam, morphine injection, ondansetron (ZOFRAN) IV,  oxyCODONE, sodium chloride flush, traMADol  General appearance: alert, cooperative and no distress Neurologic: intact Heart: v paced at 90, underlying a fib Lungs: diminished breath sounds RLL Abdomen: soft, non-tender; bowel sounds normal; no masses,  no organomegaly Extremities: extremities normal, atraumatic, no cyanosis or edema and Homans sign is negative, no sign of DVT Wound: sternum stable  Lab Results: CBC: Recent Labs  04/02/17 1657 04/02/17 1714 04/03/17 0207  WBC 11.4*  --  12.8*  HGB 9.7* 10.2* 9.6*  HCT 30.2* 30.0* 30.4*  PLT 81*  --  79*   BMET:  Recent Labs  04/02/17 0349  04/02/17 1714 04/03/17 0207  NA 137  --  138 138  K 4.1  --  4.1 4.2  CL 108  --  104 107  CO2 21*  --   --  24  GLUCOSE 119*  --  237* 167*  BUN 12   --  17 20  CREATININE 0.92  < > 1.30* 1.41*  CALCIUM 8.4*  --   --  8.9  < > = values in this interval not displayed.  CMET: Lab Results  Component Value Date   WBC 12.8 (H) 04/03/2017   HGB 9.6 (L) 04/03/2017   HCT 30.4 (L) 04/03/2017   PLT 79 (L) 04/03/2017   GLUCOSE 167 (H) 04/03/2017   CHOL 114 10/21/2016   TRIG 58 10/21/2016   HDL 49 10/21/2016   LDLDIRECT 57 04/23/2011   LDLCALC 53 10/21/2016   ALT 23 10/21/2016   AST 26 10/21/2016   NA 138 04/03/2017   K 4.2 04/03/2017   CL 107 04/03/2017   CREATININE 1.41 (H) 04/03/2017   BUN 20 04/03/2017   CO2 24 04/03/2017   TSH 1.56 01/09/2017   PSA 1.42 11/16/2014   INR 1.75 04/01/2017   HGBA1C 5.6 10/21/2016   MICROALBUR 10 05/15/2015      PT/INR:  Recent Labs  04/01/17 1447  LABPROT 20.6*  INR 1.75   Radiology: No results found.   Assessment/Plan: S/P Procedure(s) (LRB): CORONARY ARTERY BYPASS GRAFTING (CABG) x 2 (LIMA to DIAGONAL and SVG to RCA) WITH ENDOSCOPIC HARVESTING OF RIGHT SAPHENOUS VEIN (N/A) TRANSESOPHAGEAL ECHOCARDIOGRAM (TEE) (N/A) Mobilize Diabetes control d/c tubes/lines Continue foley due to strict I&O and urinary output monitoring Wean down milrinone and neo  [facility] Cardiology management of afib plts still low, avoid heparin     Grace Isaac 04/03/2017 7:21 AM

## 2017-04-03 NOTE — Progress Notes (Signed)
BP continues to be soft SBP 90's, Dr. Roxan Hockey notified verbal order to start Levo.

## 2017-04-03 NOTE — Progress Notes (Signed)
Patient ID: Steven Everett, male   DOB: 1935-11-02, 82 y.o.   MRN: [id number] EVENING ROUNDS NOTE :     [address]       [place] [address]             [phone]                 2 Days Post-Op Procedure(s) (LRB): CORONARY ARTERY BYPASS GRAFTING (CABG) x 2 (LIMA to DIAGONAL and SVG to RCA) WITH ENDOSCOPIC HARVESTING OF RIGHT SAPHENOUS VEIN (N/A) TRANSESOPHAGEAL ECHOCARDIOGRAM (TEE) (N/A)  Total Length of Stay:  LOS: 3 days  BP (!) 88/58   Pulse 88   Temp 98.3 F (36.8 C) (Oral)   Resp 19   Ht '6\' 1"'$  (1.854 m)   Wt 196 lb 10.4 oz (89.2 kg)   SpO2 97%   BMI 25.94 kg/m   .Intake/Output      05/03 0701 - 05/04 0700 05/04 0701 - 05/05 0700   I.V. (mL/kg) 2175.1 (24.4) 692.7 (7.8)   Blood     IV Piggyback 600    Total Intake(mL/kg) 2775.1 (31.1) 692.7 (7.8)   Urine (mL/kg/hr) 475 (0.2) 190 (0.2)   Blood     Chest Tube 165 (0.1) 30 (0)   Total Output 640 220   Net +2135.1 +472.7          . sodium chloride 20 mL/hr at 04/02/17 1400  . sodium chloride    . sodium chloride 20 mL/hr at 04/02/17 1400  . amiodarone 30 mg/hr (04/03/17 1634)  . dexmedetomidine (PRECEDEX) IV infusion Stopped (04/01/17 1700)  . insulin (NOVOLIN-R) infusion Stopped (04/02/17 1439)  . lactated ringers    . lactated ringers Stopped (04/02/17 0500)  . lactated ringers 20 mL/hr at 04/02/17 1400  . milrinone 0.15 mcg/kg/min (04/03/17 0730)  . nitroGLYCERIN Stopped (04/01/17 1516)  . norepinephrine (LEVOPHED) Adult infusion 10 mcg/min (04/03/17 1100)  . phenylephrine (NEO-SYNEPHRINE) Adult infusion Stopped (04/03/17 1336)     Lab Results  Component Value Date   WBC 12.8 (H) 04/03/2017   HGB 9.6 (L) 04/03/2017   HCT 30.4 (L) 04/03/2017   PLT 79 (L) 04/03/2017   GLUCOSE 167 (H) 04/03/2017   CHOL 114 10/21/2016   TRIG 58 10/21/2016   HDL 49 10/21/2016   LDLDIRECT 57 04/23/2011   LDLCALC 53 10/21/2016   ALT 23 10/21/2016   AST 26 10/21/2016   NA 138 04/03/2017   K 4.2  04/03/2017   CL 107 04/03/2017   CREATININE 1.41 (H) 04/03/2017   BUN 20 04/03/2017   CO2 24 04/03/2017   TSH 1.56 01/09/2017   PSA 1.42 11/16/2014   INR 1.75 04/01/2017   HGBA1C 5.6 10/21/2016   MICROALBUR 10 05/15/2015   Up to chair , now off neo, bp stable weaning off levaphen  Still in [place] MD  Beeper [phone] Office [phone] 04/03/2017 5:28 PM

## 2017-04-03 NOTE — Op Note (Signed)
NAME:  Steven Everett, Steven Everett NO.:  [id number]  MEDICAL RECORD NO.:  [id number]  LOCATION:                                 FACILITY:  PHYSICIAN:  Lanelle Bal, MD    DATE OF BIRTH:  10-25-1935  DATE OF PROCEDURE:  04/01/2017 DATE OF DISCHARGE:                              OPERATIVE REPORT   PREOPERATIVE DIAGNOSIS:  Unstable angina with high-grade right coronary obstruction and left anterior descending diagonal obstruction; mild aortic stenosis, status post mediastinal radiation.  POSTOPERATIVE DIAGNOSIS:  Unstable angina with high-grade right coronary obstruction and left anterior descending diagonal obstruction; mild aortic stenosis, status post mediastinal radiation.  PROCEDURE PERFORMED:  Coronary artery bypass grafting x2 with the left internal mammary to the diagonal coronary artery and reverse saphenous vein graft to the posterior descending coronary artery with right thigh greater saphenous endovein harvesting.  SURGEON:  Lanelle Bal, MD.  FIRST ASSISTANT:  Lars Pinks, PA.  BRIEF HISTORY:  The patient is an 82 year old male, who 20 years previously undergone a right lower lobectomy and partial middle lobectomy followed by radiation and chemotherapy for a non-small-cell carcinoma of the lung.  The patient has limited pulmonary function with FEV1 of 49%.  He has a known murmur at cath and echo.  He was noted to have mild aortic stenosis 1 year ago.  The patient had a Watchman device placed because of an episode of PAF and tolerating Xarelto poorly.  He now presented with unstable anginal symptoms and underwent cardiac catheterization by Dr. Daneen Schick.  Cardiac cath revealed a 40% ejection fraction, very high-grade complex mid right coronary artery lesion of 95% in a calcified vessel with a significant [place]. In addition, the patient had a long 70% stenosis of proximal LAD before the takeoff of a very large diagonal branch that was  the primary supplier of anterior wall.  The patient had a small septal branch and a small LAD.  The circ was without significant disease.  After collaboration with Cardiology and the risks and options with the patient, approaching the high-grade right lesion, which was obviously the cause of his symptoms, would have been technically difficult because of the calcified vessel, tortuous right coronary artery, from that, the coronary artery bypass grafting would offer a better option.  Risks and options were discussed with the patient in detail and agreed and signed informed consent to proceed with coronary artery bypass grafting.  DESCRIPTION OF PROCEDURE:  With Swan-Ganz and arterial line monitors in place, the patient underwent general endotracheal anesthesia without incident.  TEE probe was placed by Dr. Ermalene Postin, showed decreased ejection fraction approximately 40% with inferior hypokinesis and the aortic valve was thickened particularly one leaflet, but with evidence of mild aortic stenosis.  The patient's defibrillator was turned off remotely and appropriate time-out was performed and we proceeded with endovein harvesting of the right greater saphenous vein from the thigh.  Median sternotomy was performed.  The left internal mammary artery was dissected down as a pedicle graft.  The distal artery had good free flow and was hydrostatically dilated with heparinized saline.  We then worked at opening the pericardium, this was somewhat of a  difficult process because of the patient's previous radiation, there was thick scarring around the right atrium and along the ascending aorta.  There was very fibrotic making this difficult, we were able to dissect out the ascending aorta and the right atrium.  The patient was then systemically heparinized.  Ascending aorta was cannulated.  Because of the contraction of the right atrium, a small dual-stage venous cannula was placed, but provided adequate  drainage.  The patient was then placed on cardiopulmonary bypass 2.4 L/min/m2.  Aortic root vent cardioplegia needle was introduced into the ascending aorta.  The patient's body temperature was cooled to 32 degrees.  Aortic crossclamp was applied. 500 mL of cold blood potassium cardioplegia was administered with diastolic arrest of the heart.  We turned our attention first to the posterior descending coronary artery, which was a reasonable size vessel and was not severely calcified as the remainder of the right coronary artery including the distal right coronary artery.  The vessel was opened, admitted a 1.5-mm probe distally.  Using a running 7-0 Prolene, distal anastomosis was performed with a segment of reverse saphenous vein graft.  Additional cold blood cardioplegia was administered down the vein graft.  We then turned our attention to the anterior surface of the heart and as noted on the films, the diagonal was the largest dominant supplier of anterior wall, the LAD was a very diminutive vessel.  We elected to place the mammary artery to the largest of the suppliers of the anterior wall of the diagonal, this vessel was opened, admitted a 1.5-mm probe using running 8-0 Prolene and the left internal mammary artery was anastomosed to the diagonal coronary artery.  With the crossclamp still in place, single-punch aortotomy was performed and the vein graft to the right coronary artery was anastomosed to the ascending aorta.  The bulldog was removed from the mammary artery with prompt rise in myocardial septal temperature.  Aortic crossclamp was removed with total crossclamp time of 48 minutes.  The patient required electric defibrillation to return to a paced rhythm.  We then placed pacing wires and paced the patient atrially to increase rate.  Sites of anastomosis were inspected and were free of bleeding.  With this depressed ventricular function, we did start low-dose dopamine  and milrinone infusion.  With the patient's body temperature rewarmed to 37 degrees, he was then ventilated and weaned from cardiopulmonary bypass without difficulty.  He remained hemodynamically stable.  He was decannulated in usual fashion.  Protamine sulfate was administered with operative field hemostatic.  A left pleural tube and a Blake mediastinal drain were left in place.  Sternum was closed with #6 stainless steel wire.  Fascia closed with interrupted 0 Vicryl, running 3-0 Vicryl in subcutaneous tissue, 4-0 subcuticular stitch in the skin edges.  Dry dressings were applied.  Sponge and needle count were reported as correct at completion of procedure.  The patient tolerated the procedure without obvious complication and was transferred to the Surgical Intensive care Unit for further postoperative care.  The patient did not require any blood bank, blood products during the operative procedure. Total pump time was 81 minutes.     Lanelle Bal, MD   ______________________________ Lanelle Bal, MD    EG/MEDQ  D:  04/02/2017  T:  04/03/2017  Job:  [id number]

## 2017-04-03 NOTE — Progress Notes (Signed)
Pt continues to have low BPs 80's/40's, pt maxed on Neo at 181mgs. Dr. HRoxan Hockeynotified and orders placed for albumin, 1 amp calcium, increase milrinon, CVP monitoring and coox. Results reported to Dr. HRoxan HockeyCVP 14 and COOX 75, order given to administer additional albumin if BP does not improve and to start Amio gtt. Will contimue to monitor pt.

## 2017-04-04 ENCOUNTER — Inpatient Hospital Stay (HOSPITAL_COMMUNITY): Payer: Medicare Other

## 2017-04-04 LAB — BASIC METABOLIC PANEL
Anion gap: 7 (ref 5–15)
BUN: 29 mg/dL — ABNORMAL HIGH (ref 6–20)
CO2: 22 mmol/L (ref 22–32)
Calcium: 8.9 mg/dL (ref 8.9–10.3)
Chloride: 102 mmol/L (ref 101–111)
Creatinine, Ser: 1.28 mg/dL — ABNORMAL HIGH (ref 0.61–1.24)
GFR calc Af Amer: 59 mL/min — ABNORMAL LOW (ref >60)
GFR calc non Af Amer: 51 mL/min — ABNORMAL LOW (ref >60)
Glucose, Bld: 149 mg/dL — ABNORMAL HIGH (ref 65–99)
Potassium: 3.9 mmol/L (ref 3.5–5.1)
Sodium: 131 mmol/L — ABNORMAL LOW (ref 135–145)

## 2017-04-04 LAB — GLUCOSE, CAPILLARY
Glucose-Capillary: 176 mg/dL — ABNORMAL HIGH (ref 65–99)
Glucose-Capillary: 142 mg/dL — ABNORMAL HIGH (ref 65–99)
Glucose-Capillary: 166 mg/dL — ABNORMAL HIGH (ref 65–99)
Glucose-Capillary: 159 mg/dL — ABNORMAL HIGH (ref 65–99)

## 2017-04-04 LAB — CBC
HCT: 30.2 % — ABNORMAL LOW (ref 39.0–52.0)
Hemoglobin: 9.8 g/dL — ABNORMAL LOW (ref 13.0–17.0)
MCH: 27.8 pg (ref 26.0–34.0)
MCHC: 32.5 g/dL (ref 30.0–36.0)
MCV: 85.6 fL (ref 78.0–100.0)
Platelets: 89 10*3/uL — ABNORMAL LOW (ref 150–400)
RBC: 3.53 MIL/uL — ABNORMAL LOW (ref 4.22–5.81)
RDW: 14.9 % (ref 11.5–15.5)
WBC: 11.1 10*3/uL — ABNORMAL HIGH (ref 4.0–10.5)

## 2017-04-04 LAB — COOXEMETRY PANEL
Carboxyhemoglobin: 1.6 % — ABNORMAL HIGH (ref 0.5–1.5)
Methemoglobin: 0.8 % (ref 0.0–1.5)
O2 Saturation: 57.7 %
Total hemoglobin: 10.2 g/dL — ABNORMAL LOW (ref 12.0–16.0)

## 2017-04-04 MED ORDER — FUROSEMIDE 10 MG/ML IJ SOLN
40.0000 mg | Freq: Once | INTRAMUSCULAR | Status: AC
Start: 2017-04-04 — End: 2017-04-04
  Administered 2017-04-04: 40 mg via INTRAVENOUS
  Filled 2017-04-04: qty 4

## 2017-04-04 MED ORDER — IPRATROPIUM-ALBUTEROL 0.5-2.5 (3) MG/3ML IN SOLN
3.0000 mL | RESPIRATORY_TRACT | Status: DC | PRN
  Administered 2017-04-04: 3 mL via RESPIRATORY_TRACT
  Filled 2017-04-04: qty 3

## 2017-04-04 MED ORDER — AMIODARONE HCL IN DEXTROSE 360-4.14 MG/200ML-% IV SOLN
60.0000 mg/h | INTRAVENOUS | Status: AC
  Administered 2017-04-04: 30 mg/h via INTRAVENOUS
  Filled 2017-04-04 (×2): qty 200

## 2017-04-04 MED ORDER — AMIODARONE LOAD VIA INFUSION
150.0000 mg | Freq: Once | INTRAVENOUS | Status: AC
  Administered 2017-04-04: 150 mg via INTRAVENOUS
  Filled 2017-04-04: qty 83.34

## 2017-04-04 MED ORDER — AMIODARONE HCL IN DEXTROSE 360-4.14 MG/200ML-% IV SOLN
30.0000 mg/h | INTRAVENOUS | Status: DC
  Administered 2017-04-05 – 2017-04-06 (×3): 30 mg/h via INTRAVENOUS
  Filled 2017-04-04 (×3): qty 200

## 2017-04-04 MED ORDER — MILRINONE LACTATE IN DEXTROSE 20-5 MG/100ML-% IV SOLN
0.0625 ug/kg/min | INTRAVENOUS | Status: DC
  Administered 2017-04-04 – 2017-04-05 (×2): 0.0625 ug/kg/min via INTRAVENOUS
  Filled 2017-04-04: qty 100

## 2017-04-04 NOTE — Evaluation (Signed)
Physical Therapy Evaluation Patient Details Name: MINORU CHAP MRN: [id number] DOB: 17-Sep-1935 Today's Date: 04/04/2017   History of Present Illness  Pt is an 82 y/o male s/p CABG secondary to NSTEMI. PMH including but not limited to CAD, DM, R lung cancer, liver cirrhosis, HTN, PAF, and cardiac defibrillator placement in 2014.  Clinical Impression  Pt presented supine in bed with HOB elevated, awake and willing to participate in therapy session. Prior to admission, pt reported that he was independent with all functional mobility and ADLs. Pt lives alone but stated he would have assistance 24/7 initially upon d/c. Pt ambulated in hallway with min A x2 for stability without an AD. All VSS throughout. Pt was returned to bed as pt's RN was planning to remove A-line after session. Pt would continue to benefit from skilled physical therapy services at this time while admitted and after d/c to address the below listed limitations in order to improve overall safety and independence with functional mobility.     Follow Up Recommendations Home health PT;Supervision/Assistance - 24 hour    Equipment Recommendations  Rolling walker with 5" wheels    Recommendations for Other Services       Precautions / Restrictions Precautions Precautions: Fall;Sternal Precaution Comments: PT reviewed sternal precautions with pt throughout session Restrictions Weight Bearing Restrictions: No      Mobility  Bed Mobility Overal bed mobility: Needs Assistance Bed Mobility: Rolling;Sidelying to Sit;Sit to Sidelying Rolling: Supervision Sidelying to sit: Min guard     Sit to sidelying: Min assist General bed mobility comments: increased time, good technique, min guard for safety to achieve sitting EOB and min A with bilateral LEs to return to sidelying  Transfers Overall transfer level: Needs assistance Equipment used: 2 person hand held assist Transfers: Sit to/from Stand Sit to Stand: Min assist;+2  safety/equipment         General transfer comment: increased time, held cardiac pillow, good technique, min A x2 to rise from bed and for stability with transition  Ambulation/Gait Ambulation/Gait assistance: Min assist;+2 safety/equipment Ambulation Distance (Feet): 150 Feet Assistive device: 2 person hand held assist Gait Pattern/deviations: Step-through pattern;Decreased stride length Gait velocity: decreased Gait velocity interpretation: Below normal speed for age/gender General Gait Details: min A for stability. pt required one standing rest break secondary to fatigue. All VSS throughout.  Stairs            Wheelchair Mobility    Modified Rankin (Stroke Patients Only)       Balance Overall balance assessment: Needs assistance Sitting-balance support: Feet supported Sitting balance-Leahy Scale: Good     Standing balance support: During functional activity;Bilateral upper extremity supported Standing balance-Leahy Scale: Poor Standing balance comment: pt reliant on external supports                             Pertinent Vitals/Pain Pain Assessment: No/denies pain    Home Living Family/patient expects to be discharged to:: Private residence Living Arrangements: Alone Available Help at Discharge: Family;Friend(s);Available 24 hours/day Type of Home: Apartment Home Access: Level entry     Home Layout: One level Home Equipment: Cane - single point;Shower seat      Prior Function Level of Independence: Independent               Hand Dominance        Extremity/Trunk Assessment   Upper Extremity Assessment Upper Extremity Assessment: Overall WFL for tasks assessed  Lower Extremity Assessment Lower Extremity Assessment: Overall WFL for tasks assessed       Communication   Communication: No difficulties  Cognition Arousal/Alertness: Awake/alert Behavior During Therapy: WFL for tasks assessed/performed Overall Cognitive  Status: Within Functional Limits for tasks assessed                                        General Comments      Exercises     Assessment/Plan    PT Assessment Patient needs continued PT services  PT Problem List Decreased activity tolerance;Decreased balance;Decreased mobility;Decreased coordination;Decreased knowledge of use of DME;Decreased safety awareness;Decreased knowledge of precautions;Cardiopulmonary status limiting activity       PT Treatment Interventions DME instruction;Gait training;Stair training;Functional mobility training;Therapeutic activities;Therapeutic exercise;Balance training;Neuromuscular re-education;Patient/family education    PT Goals (Current goals can be found in the Care Plan section)  Acute Rehab PT Goals Patient Stated Goal: return home PT Goal Formulation: With patient Time For Goal Achievement: 04/18/17 Potential to Achieve Goals: Good    Frequency Min 3X/week   Barriers to discharge        Co-evaluation               AM-PAC PT "6 Clicks" Daily Activity  Outcome Measure Difficulty turning over in bed (including adjusting bedclothes, sheets and blankets)?: A Little Difficulty moving from lying on back to sitting on the side of the bed? : A Little Difficulty sitting down on and standing up from a chair with arms (e.g., wheelchair, bedside commode, etc,.)?: Total Help needed moving to and from a bed to chair (including a wheelchair)?: A Little Help needed walking in hospital room?: A Little Help needed climbing 3-5 steps with a railing? : A Lot 6 Click Score: 15    End of Session Equipment Utilized During Treatment: Gait belt Activity Tolerance: Patient limited by fatigue Patient left: in bed;with call bell/phone within reach Nurse Communication: Mobility status PT Visit Diagnosis: Unsteadiness on feet (R26.81);Other abnormalities of gait and mobility (R26.89)    Time: 7654-6503 PT Time Calculation (min) (ACUTE  ONLY): 24 min   Charges:   PT Evaluation $PT Eval Moderate Complexity: 1 Procedure PT Treatments $Gait Training: 8-22 mins   PT G Codes:        Ione, PT, DPT [place] 04/04/2017, 12:09 PM

## 2017-04-04 NOTE — Progress Notes (Signed)
Left radial arterial line removed per MD order. Pressure held for 20 minutes. No signs or symptoms of bleeding. Site is level zero. Will continue to monitor closely.  Shelba Flake, RN

## 2017-04-04 NOTE — Progress Notes (Signed)
82 yo male history of chronic systolic HF, moderate AS, PAF s/p watchman, cirrhosis admitted with chest pain. S/p CABG yesterday, received LIMA-diag and SVG-PDA. Prior echo 01/2017 LVEF 60-65%, no WMAs, grade II diastolic dysfunction. LVEF by TEE 35-40% itraop. Has had some low hp's at times, has been on levophed. Afib, had been on amiodarone. Carboxy hemoglobin 58% this AM. CXR possible small left effusion.   Patient with increased SOB today. On exam mild increase work of breathing. Bilateral expiratory wheezing. Milldy elevated JVD. CoOx reasonable, CVP most reasonable 7. Will treat with nebulizer treatment, one time dose of IV lasix. Heart rates in 90s, reasonable rate control with intermittent afib.   Carlyle Dolly MD

## 2017-04-04 NOTE — Progress Notes (Signed)
Patient ID: Steven Everett, male   DOB: 06-11-1935, 82 y.o.   MRN: [id number] TCTS DAILY ICU PROGRESS NOTE                   [address]            [place] [address]          [phone]   3 Days Post-Op Procedure(s) (LRB): CORONARY ARTERY BYPASS GRAFTING (CABG) x 2 (LIMA to DIAGONAL and SVG to RCA) WITH ENDOSCOPIC HARVESTING OF RIGHT SAPHENOUS VEIN (N/A) TRANSESOPHAGEAL ECHOCARDIOGRAM (TEE) (N/A)  Total Length of Stay:  LOS: 4 days   Subjective: Walked around unit   Objective: Vital signs in last 24 hours: Temp:  [97.7 F (36.5 C)-98.4 F (36.9 C)] 97.9 F (36.6 C) (05/05 0747) Pulse Rate:  [88-102] 98 (05/05 0928) Cardiac Rhythm: Ventricular paced (05/05 0830) Resp:  [8-25] 25 (05/05 0836) BP: (84-112)/(49-69) 96/58 (05/05 0928) SpO2:  [93 %-100 %] 98 % (05/05 0836) Arterial Line BP: (84-137)/(35-59) 110/41 (05/05 0836) Weight:  [200 lb 4.8 oz (90.9 kg)] 200 lb 4.8 oz (90.9 kg) (05/05 0500)  Filed Weights   04/02/17 0500 04/03/17 0500 04/04/17 0500  Weight: 191 lb 2.2 oz (86.7 kg) 196 lb 10.4 oz (89.2 kg) 200 lb 4.8 oz (90.9 kg)    Weight change: 3 lb 10.4 oz (1.655 kg)   Hemodynamic parameters for last 24 hours: CVP:  [4 mmHg] 4 mmHg  Intake/Output from previous day: 05/04 0701 - 05/05 0700 In: 3441.9 [I.V.:3441.9] Out: 720 [Urine:690; Chest Tube:30]  Intake/Output this shift: Total I/O In: 464.9 [P.O.:315; I.V.:149.9] Out: -   Current Meds: Scheduled Meds: . acetaminophen  1,000 mg Oral Q6H   Or  . acetaminophen (TYLENOL) oral liquid 160 mg/5 mL  1,000 mg Per Tube Q6H  . alfuzosin  10 mg Oral Q breakfast  . aspirin EC  325 mg Oral Daily   Or  . aspirin  324 mg Per Tube Daily  . bisacodyl  10 mg Oral Daily   Or  . bisacodyl  10 mg Rectal Daily  . docusate sodium  200 mg Oral Daily  . finasteride  5 mg Oral Daily  . insulin aspart  0-24 Units Subcutaneous Q4H  . insulin regular  0-10 Units Intravenous TID WC  . levothyroxine  112  mcg Oral QAC breakfast  . mouth rinse  15 mL Mouth Rinse BID  . metoprolol tartrate  12.5 mg Oral BID   Or  . metoprolol tartrate  12.5 mg Per Tube BID  . pantoprazole  40 mg Oral Daily  . pravastatin  40 mg Oral Daily  . sodium chloride flush  3 mL Intravenous Q12H   Continuous Infusions: . sodium chloride Stopped (04/03/17 2100)  . sodium chloride    . sodium chloride Stopped (04/03/17 2100)  . amiodarone 30 mg/hr (04/04/17 0600)  . dexmedetomidine (PRECEDEX) IV infusion Stopped (04/01/17 1700)  . lactated ringers    . lactated ringers Stopped (04/02/17 0500)  . lactated ringers 20 mL/hr at 04/04/17 0600  . milrinone 0.151 mcg/kg/min (04/04/17 0600)  . nitroGLYCERIN Stopped (04/01/17 1516)  . norepinephrine (LEVOPHED) Adult infusion 7 mcg/min (04/04/17 0902)  . phenylephrine (NEO-SYNEPHRINE) Adult infusion Stopped (04/03/17 1336)   PRN Meds:.sodium chloride, lactated ringers, levalbuterol, metoprolol, midazolam, morphine injection, ondansetron (ZOFRAN) IV, oxyCODONE, sodium chloride flush, traMADol  General appearance: alert, cooperative and no distress Neurologic: intact Heart: paced v Lungs: diminished breath sounds RLL Abdomen: soft, non-tender; bowel  sounds normal; no masses,  no organomegaly Extremities: extremities normal, atraumatic, no cyanosis or edema and Homans sign is negative, no sign of DVT Wound: sternum stable  Lab Results: CBC: Recent Labs  04/03/17 0207 04/04/17 0503  WBC 12.8* 11.1*  HGB 9.6* 9.8*  HCT 30.4* 30.2*  PLT 79* 89*   BMET:  Recent Labs  04/03/17 0207 04/04/17 0503  NA 138 131*  K 4.2 3.9  CL 107 102  CO2 24 22  GLUCOSE 167* 149*  BUN 20 29*  CREATININE 1.41* 1.28*  CALCIUM 8.9 8.9    CMET: Lab Results  Component Value Date   WBC 11.1 (H) 04/04/2017   HGB 9.8 (L) 04/04/2017   HCT 30.2 (L) 04/04/2017   PLT 89 (L) 04/04/2017   GLUCOSE 149 (H) 04/04/2017   CHOL 114 10/21/2016   TRIG 58 10/21/2016   HDL 49 10/21/2016    LDLDIRECT 57 04/23/2011   LDLCALC 53 10/21/2016   ALT 23 10/21/2016   AST 26 10/21/2016   NA 131 (L) 04/04/2017   K 3.9 04/04/2017   CL 102 04/04/2017   CREATININE 1.28 (H) 04/04/2017   BUN 29 (H) 04/04/2017   CO2 22 04/04/2017   TSH 1.56 01/09/2017   PSA 1.42 11/16/2014   INR 1.75 04/01/2017   HGBA1C 5.6 10/21/2016   MICROALBUR 10 05/15/2015      PT/INR:  Recent Labs  04/01/17 1447  LABPROT 20.6*  INR 1.75   Radiology: Dg Chest Port 1 View  Result Date: 04/04/2017 CLINICAL DATA:  82 year old male status post CABG postop day 3. Previous right middle and lower lobectomy. EXAM: PORTABLE CHEST 1 VIEW COMPARISON:  04/03/2017 and earlier. FINDINGS: Portable AP semi upright view at at 0448 hours. Left chest tube removed since yesterday. No pneumothorax identified. Stable lung volumes. Stable cardiac size and mediastinal contours. Right IJ introducer sheath remains. Stable left chest AICD. Mildly increased streaky opacity at the left lung base. Possible small left pleural effusion. Stable right lung. IMPRESSION: 1. Left chest tube removed with no pneumothorax. 2. Mildly increased left lung base atelectasis. Possible small left pleural effusion. 3. Prior right partial pneumonectomy. Electronically Signed   By: Genevie Ann M.D.   On: 04/04/2017 07:31     Assessment/Plan: S/P Procedure(s) (LRB): CORONARY ARTERY BYPASS GRAFTING (CABG) x 2 (LIMA to DIAGONAL and SVG to RCA) WITH ENDOSCOPIC HARVESTING OF RIGHT SAPHENOUS VEIN (N/A) TRANSESOPHAGEAL ECHOCARDIOGRAM (TEE) (N/A) Mobilize Diabetes control Cox > 70  Wean levphed  D/c [name] 04/04/2017 9:32 AM

## 2017-04-04 NOTE — Progress Notes (Signed)
Patient ID: Steven Everett, male   DOB: [birth year], 82 y.o.   MRN: [id number] EVENING ROUNDS NOTE :     [address]       [place] [address]             [phone]                 3 Days Post-Op Procedure(s) (LRB): CORONARY ARTERY BYPASS GRAFTING (CABG) x 2 (LIMA to DIAGONAL and SVG to RCA) WITH ENDOSCOPIC HARVESTING OF RIGHT SAPHENOUS VEIN (N/A) TRANSESOPHAGEAL ECHOCARDIOGRAM (TEE) (N/A)  Total Length of Stay:  LOS: 4 days  BP (!) 95/46   Pulse 79   Temp 98.3 F (36.8 C) (Oral)   Resp 19   Ht '6\' 1"'$  (1.854 m)   Wt 200 lb 4.8 oz (90.9 kg)   SpO2 100%   BMI 26.43 kg/m   .Intake/Output      05/05 0701 - 05/06 0700   P.O. 640   I.V. (mL/kg) 636.6 (7)   Total Intake(mL/kg) 1276.6 (14)   Urine (mL/kg/hr) 425 (0.4)   Chest Tube    Total Output 425   Net +851.6         . sodium chloride Stopped (04/03/17 2100)  . sodium chloride    . sodium chloride Stopped (04/03/17 2100)  . amiodarone Stopped (04/04/17 1534)   Followed by  . amiodarone 30 mg/hr (04/04/17 1535)  . dexmedetomidine (PRECEDEX) IV infusion Stopped (04/01/17 1700)  . lactated ringers    . lactated ringers Stopped (04/02/17 0500)  . lactated ringers 20 mL/hr at 04/04/17 0600  . milrinone 0.0625 mcg/kg/min (04/04/17 1008)  . nitroGLYCERIN Stopped (04/01/17 1516)  . norepinephrine (LEVOPHED) Adult infusion Stopped (04/04/17 1430)  . phenylephrine (NEO-SYNEPHRINE) Adult infusion Stopped (04/03/17 1336)     Lab Results  Component Value Date   WBC 11.1 (H) 04/04/2017   HGB 9.8 (L) 04/04/2017   HCT 30.2 (L) 04/04/2017   PLT 89 (L) 04/04/2017   GLUCOSE 149 (H) 04/04/2017   CHOL 114 10/21/2016   TRIG 58 10/21/2016   HDL 49 10/21/2016   LDLDIRECT 57 04/23/2011   LDLCALC 53 10/21/2016   ALT 23 10/21/2016   AST 26 10/21/2016   NA 131 (L) 04/04/2017   K 3.9 04/04/2017   CL 102 04/04/2017   CREATININE 1.28 (H) 04/04/2017   BUN 29 (H) 04/04/2017   CO2 22 04/04/2017   TSH 1.56 01/09/2017    PSA 1.42 11/16/2014   INR 1.75 04/01/2017   HGBA1C 5.6 10/21/2016   MICROALBUR 10 05/15/2015   Developed rapid afib when walked and dropped sats , now back on iv cordrone , rate slowed now v paced  Feels better sitting in chair   [place] [phone] Office [phone] 04/04/2017 7:30 PM

## 2017-04-04 NOTE — Progress Notes (Signed)
Foley catheter removed per MD order.   Azriel Jakob N. Emogene Morgan, RN

## 2017-04-04 NOTE — Progress Notes (Signed)
Upon getting the patient up from the bed to a standing position, PT stated he felt "light headed, cant breathe very well" and pt went into rapid afib into the 130's. This RN placed the patient back in bed, placed him on 3LNC. MD notified and verbal orders to start Amio gtt and bolus again. Will continue to monitor closely.

## 2017-04-04 NOTE — Progress Notes (Signed)
MD verbal order to change rate on amio to 30 mg/hr at this time. Paged cards. Will await new orders.  Manus Gunning, RN

## 2017-04-05 ENCOUNTER — Inpatient Hospital Stay (HOSPITAL_COMMUNITY): Payer: Medicare Other

## 2017-04-05 DIAGNOSIS — I5022 Chronic systolic (congestive) heart failure: Secondary | ICD-10-CM

## 2017-04-05 DIAGNOSIS — I480 Paroxysmal atrial fibrillation: Secondary | ICD-10-CM

## 2017-04-05 DIAGNOSIS — I2583 Coronary atherosclerosis due to lipid rich plaque: Secondary | ICD-10-CM

## 2017-04-05 LAB — COMPREHENSIVE METABOLIC PANEL
ALT: 32 U/L (ref 17–63)
AST: 37 U/L (ref 15–41)
Albumin: 2.8 g/dL — ABNORMAL LOW (ref 3.5–5.0)
Alkaline Phosphatase: 54 U/L (ref 38–126)
Anion gap: 8 (ref 5–15)
BUN: 30 mg/dL — ABNORMAL HIGH (ref 6–20)
CO2: 25 mmol/L (ref 22–32)
Calcium: 8.8 mg/dL — ABNORMAL LOW (ref 8.9–10.3)
Chloride: 101 mmol/L (ref 101–111)
Creatinine, Ser: 1.32 mg/dL — ABNORMAL HIGH (ref 0.61–1.24)
GFR calc Af Amer: 57 mL/min — ABNORMAL LOW (ref >60)
GFR calc non Af Amer: 49 mL/min — ABNORMAL LOW (ref >60)
Glucose, Bld: 134 mg/dL — ABNORMAL HIGH (ref 65–99)
Potassium: 4.2 mmol/L (ref 3.5–5.1)
Sodium: 134 mmol/L — ABNORMAL LOW (ref 135–145)
Total Bilirubin: 0.6 mg/dL (ref 0.3–1.2)
Total Protein: 5.3 g/dL — ABNORMAL LOW (ref 6.5–8.1)

## 2017-04-05 LAB — CBC
HCT: 30 % — ABNORMAL LOW (ref 39.0–52.0)
Hemoglobin: 9.7 g/dL — ABNORMAL LOW (ref 13.0–17.0)
MCH: 27.8 pg (ref 26.0–34.0)
MCHC: 32.3 g/dL (ref 30.0–36.0)
MCV: 86 fL (ref 78.0–100.0)
Platelets: 101 10*3/uL — ABNORMAL LOW (ref 150–400)
RBC: 3.49 MIL/uL — ABNORMAL LOW (ref 4.22–5.81)
RDW: 15.1 % (ref 11.5–15.5)
WBC: 7.5 10*3/uL (ref 4.0–10.5)

## 2017-04-05 LAB — GLUCOSE, CAPILLARY
Glucose-Capillary: 137 mg/dL — ABNORMAL HIGH (ref 65–99)
Glucose-Capillary: 146 mg/dL — ABNORMAL HIGH (ref 65–99)
Glucose-Capillary: 127 mg/dL — ABNORMAL HIGH (ref 65–99)
Glucose-Capillary: 132 mg/dL — ABNORMAL HIGH (ref 65–99)
Glucose-Capillary: 140 mg/dL — ABNORMAL HIGH (ref 65–99)

## 2017-04-05 MED ORDER — CHLORHEXIDINE GLUCONATE CLOTH 2 % EX PADS
6.0000 | MEDICATED_PAD | Freq: Every day | CUTANEOUS | Status: DC
  Administered 2017-04-05 – 2017-04-06 (×2): 6 via TOPICAL

## 2017-04-05 MED ORDER — SODIUM CHLORIDE 0.9% FLUSH
10.0000 mL | INTRAVENOUS | Status: DC | PRN
  Administered 2017-04-06: 20 mL
  Administered 2017-04-07: 10 mL
  Filled 2017-04-05 (×2): qty 40

## 2017-04-05 MED ORDER — SODIUM CHLORIDE 0.9% FLUSH
10.0000 mL | Freq: Two times a day (BID) | INTRAVENOUS | Status: DC
  Administered 2017-04-05: 10 mL
  Administered 2017-04-06: 20 mL

## 2017-04-05 MED ORDER — FUROSEMIDE 10 MG/ML IJ SOLN
40.0000 mg | Freq: Once | INTRAMUSCULAR | Status: AC
  Administered 2017-04-05: 40 mg via INTRAVENOUS
  Filled 2017-04-05: qty 4

## 2017-04-05 MED ORDER — INSULIN ASPART 100 UNIT/ML ~~LOC~~ SOLN
0.0000 [IU] | Freq: Three times a day (TID) | SUBCUTANEOUS | Status: DC
  Administered 2017-04-05 (×2): 2 [IU] via SUBCUTANEOUS

## 2017-04-05 NOTE — Progress Notes (Signed)
Patient ID: CORBEN AUZENNE, male   DOB: June 01, 1935, 82 y.o.   MRN: [id number] [facility] DAILY ICU PROGRESS NOTE                   [address]            [place] [address]          [phone]   4 Days Post-Op Procedure(s) (LRB): CORONARY ARTERY BYPASS GRAFTING (CABG) x 2 (LIMA to DIAGONAL and SVG to RCA) WITH ENDOSCOPIC HARVESTING OF RIGHT SAPHENOUS VEIN (N/A) TRANSESOPHAGEAL ECHOCARDIOGRAM (TEE) (N/A)  Total Length of Stay:  LOS: 5 days   Subjective:  Up in chair , alert and eating , rate better controlled   Objective: Vital signs in last 24 hours: Temp:  [97.9 F (36.6 C)-98.7 F (37.1 C)] 98.7 F (37.1 C) (05/05 2300) Pulse Rate:  [68-131] 71 (05/06 0600) Cardiac Rhythm: Ventricular paced (05/06 0400) Resp:  [13-38] 26 (05/06 0600) BP: (80-125)/(46-108) 108/57 (05/06 0600) SpO2:  [86 %-100 %] 100 % (05/06 0600) Arterial Line BP: (92-119)/(35-45) 107/44 (05/05 1030) Weight:  [201 lb (91.2 kg)] 201 lb (91.2 kg) (05/06 0500)  Filed Weights   04/03/17 0500 04/04/17 0500 04/05/17 0500  Weight: 196 lb 10.4 oz (89.2 kg) 200 lb 4.8 oz (90.9 kg) 201 lb (91.2 kg)    Weight change: 11.2 oz (0.318 kg)   Hemodynamic parameters for last 24 hours: CVP:  [4 mmHg-7 mmHg] 7 mmHg  Intake/Output from previous day: 05/05 0701 - 05/06 0700 In: 1601.3 [P.O.:640; I.V.:961.3] Out: 725 [Urine:725]  Intake/Output this shift: No intake/output data recorded.  Current Meds: Scheduled Meds: . acetaminophen  1,000 mg Oral Q6H   Or  . acetaminophen (TYLENOL) oral liquid 160 mg/5 mL  1,000 mg Per Tube Q6H  . alfuzosin  10 mg Oral Q breakfast  . aspirin EC  325 mg Oral Daily   Or  . aspirin  324 mg Per Tube Daily  . bisacodyl  10 mg Oral Daily   Or  . bisacodyl  10 mg Rectal Daily  . docusate sodium  200 mg Oral Daily  . finasteride  5 mg Oral Daily  . insulin aspart  0-24 Units Subcutaneous Q4H  . insulin regular  0-10 Units Intravenous TID WC  . levothyroxine  112 mcg  Oral QAC breakfast  . mouth rinse  15 mL Mouth Rinse BID  . metoprolol tartrate  12.5 mg Oral BID   Or  . metoprolol tartrate  12.5 mg Per Tube BID  . pantoprazole  40 mg Oral Daily  . pravastatin  40 mg Oral Daily  . sodium chloride flush  3 mL Intravenous Q12H   Continuous Infusions: . sodium chloride Stopped (04/03/17 2100)  . sodium chloride    . sodium chloride Stopped (04/03/17 2100)  . amiodarone 30 mg/hr (04/05/17 0600)  . dexmedetomidine (PRECEDEX) IV infusion Stopped (04/01/17 1700)  . lactated ringers    . lactated ringers Stopped (04/02/17 0500)  . lactated ringers 20 mL/hr at 04/05/17 0600  . milrinone 0.0625 mcg/kg/min (04/05/17 0600)  . nitroGLYCERIN Stopped (04/01/17 1516)  . norepinephrine (LEVOPHED) Adult infusion Stopped (04/04/17 1430)  . phenylephrine (NEO-SYNEPHRINE) Adult infusion Stopped (04/03/17 1336)   PRN Meds:.sodium chloride, ipratropium-albuterol, lactated ringers, metoprolol, midazolam, morphine injection, ondansetron (ZOFRAN) IV, oxyCODONE, sodium chloride flush, traMADol  General appearance: alert and cooperative Neurologic: intact Heart: irregularly irregular rhythm Lungs: diminished breath sounds bibasilar and RLL Abdomen: soft, non-tender; bowel sounds normal; no masses,  no organomegaly Extremities: extremities normal, atraumatic, no cyanosis or edema and Homans sign is negative, no sign of DVT Wound: sternum intact  Lab Results: CBC: Recent Labs  04/04/17 0503 04/05/17 0400  WBC 11.1* 7.5  HGB 9.8* 9.7*  HCT 30.2* 30.0*  PLT 89* 101*   BMET:  Recent Labs  04/04/17 0503 04/05/17 0400  NA 131* 134*  K 3.9 4.2  CL 102 101  CO2 22 25  GLUCOSE 149* 134*  BUN 29* 30*  CREATININE 1.28* 1.32*  CALCIUM 8.9 8.8*    CMET: Lab Results  Component Value Date   WBC 7.5 04/05/2017   HGB 9.7 (L) 04/05/2017   HCT 30.0 (L) 04/05/2017   PLT 101 (L) 04/05/2017   GLUCOSE 134 (H) 04/05/2017   CHOL 114 10/21/2016   TRIG 58 10/21/2016     HDL 49 10/21/2016   LDLDIRECT 57 04/23/2011   LDLCALC 53 10/21/2016   ALT 32 04/05/2017   AST 37 04/05/2017   NA 134 (L) 04/05/2017   K 4.2 04/05/2017   CL 101 04/05/2017   CREATININE 1.32 (H) 04/05/2017   BUN 30 (H) 04/05/2017   CO2 25 04/05/2017   TSH 1.56 01/09/2017   PSA 1.42 11/16/2014   INR 1.75 04/01/2017   HGBA1C 5.6 10/21/2016   MICROALBUR 10 05/15/2015   COX 57.7   PT/INR: No results for input(s): LABPROT, INR in the last 72 hours. Radiology: No results found.   Assessment/Plan: S/P Procedure(s) (LRB): CORONARY ARTERY BYPASS GRAFTING (CABG) x 2 (LIMA to DIAGONAL and SVG to RCA) WITH ENDOSCOPIC HARVESTING OF RIGHT SAPHENOUS VEIN (N/A) TRANSESOPHAGEAL ECHOCARDIOGRAM (TEE) (N/A) Mobilize Diuresis Diabetes control Place pic line today and remove right Alonza Bogus 04/05/2017 7:24 AM

## 2017-04-05 NOTE — Progress Notes (Signed)
Peripherally Inserted Central Catheter/Midline Placement  The IV Nurse has discussed with the patient and/or persons authorized to consent for the patient, the purpose of this procedure and the potential benefits and risks involved with this procedure.  The benefits include less needle sticks, lab draws from the catheter, and the patient may be discharged home with the catheter. Risks include, but not limited to, infection, bleeding, blood clot (thrombus formation), and puncture of an artery; nerve damage and irregular heartbeat and possibility to perform a PICC exchange if needed/ordered by physician.  Alternatives to this procedure were also discussed.  Bard Power PICC patient education guide, fact sheet on infection prevention and patient information card has been provided to patient /or left at bedside.    PICC/Midline Placement Documentation  PICC Double Lumen 04/05/17 PICC Right Brachial 39 cm 0 cm (Active)  Indication for Insertion or Continuance of Line Vasoactive infusions;Prolonged intravenous therapies 04/05/2017 11:00 AM  Exposed Catheter (cm) 0 cm 04/05/2017 11:00 AM  Site Assessment Clean;Dry;Intact 04/05/2017 11:00 AM  Lumen #1 Status Flushed;Saline locked;Blood return noted 04/05/2017 11:00 AM  Lumen #2 Status Flushed;Saline locked;Blood return noted 04/05/2017 11:00 AM  Dressing Type Transparent 04/05/2017 11:00 AM  Dressing Status Clean;Dry;Intact;Antimicrobial disc in place 04/05/2017 11:00 AM  Line Care Connections checked and tightened 04/05/2017 11:00 AM  Line Adjustment (NICU/IV Team Only) No 04/05/2017 11:00 AM  Dressing Intervention New dressing 04/05/2017 11:00 AM  Dressing Change Due 04/12/17 04/05/2017 11:00 AM       Rolena Infante 04/05/2017, 11:56 AM

## 2017-04-05 NOTE — Progress Notes (Signed)
Patient ID: Steven Everett, male   DOB: Apr 27, 1935, 82 y.o.   MRN: [id number] EVENING ROUNDS NOTE :     [address]       [place] [address]             [phone]                 4 Days Post-Op Procedure(s) (LRB): CORONARY ARTERY BYPASS GRAFTING (CABG) x 2 (LIMA to DIAGONAL and SVG to RCA) WITH ENDOSCOPIC HARVESTING OF RIGHT SAPHENOUS VEIN (N/A) TRANSESOPHAGEAL ECHOCARDIOGRAM (TEE) (N/A)  Total Length of Stay:  LOS: 5 days  BP (!) 84/37   Pulse 61   Temp 97.9 F (36.6 C) (Oral)   Resp (!) 26   Ht '6\' 1"'$  (1.854 m)   Wt 201 lb (91.2 kg)   SpO2 93%   BMI 26.52 kg/m   .Intake/Output      05/06 0701 - 05/07 0700   P.O. 960   I.V. (mL/kg) 420.2 (4.6)   Total Intake(mL/kg) 1380.2 (15.1)   Urine (mL/kg/hr) 530 (0.5)   Stool 0 (0)   Total Output 530   Net +850.2       Urine Occurrence 2 x   Stool Occurrence 2 x     . sodium chloride Stopped (04/03/17 2100)  . sodium chloride    . sodium chloride Stopped (04/03/17 2100)  . amiodarone 30 mg/hr (04/05/17 1800)  . dexmedetomidine (PRECEDEX) IV infusion Stopped (04/01/17 1700)  . lactated ringers    . lactated ringers Stopped (04/02/17 0500)  . lactated ringers 20 mL/hr at 04/05/17 1800  . milrinone 0.0625 mcg/kg/min (04/05/17 1800)  . nitroGLYCERIN Stopped (04/01/17 1516)  . norepinephrine (LEVOPHED) Adult infusion Stopped (04/04/17 1430)  . phenylephrine (NEO-SYNEPHRINE) Adult infusion Stopped (04/03/17 1336)     Lab Results  Component Value Date   WBC 7.5 04/05/2017   HGB 9.7 (L) 04/05/2017   HCT 30.0 (L) 04/05/2017   PLT 101 (L) 04/05/2017   GLUCOSE 134 (H) 04/05/2017   CHOL 114 10/21/2016   TRIG 58 10/21/2016   HDL 49 10/21/2016   LDLDIRECT 57 04/23/2011   LDLCALC 53 10/21/2016   ALT 32 04/05/2017   AST 37 04/05/2017   NA 134 (L) 04/05/2017   K 4.2 04/05/2017   CL 101 04/05/2017   CREATININE 1.32 (H) 04/05/2017   BUN 30 (H) 04/05/2017   CO2 25 04/05/2017   TSH 1.56 01/09/2017   PSA  1.42 11/16/2014   INR 1.75 04/01/2017   HGBA1C 5.6 10/21/2016   MICROALBUR 10 05/15/2015   afib but rate better controlled  Feels better pic line in   Grace Isaac MD  [place] Office [phone] 04/05/2017 7:19 PM

## 2017-04-05 NOTE — Progress Notes (Signed)
DAILY PROGRESS NOTE   Patient Name: Steven Everett Date of Encounter: 04/05/2017  Hospital Problem List   Principal Problem:   NSTEMI (non-ST elevated myocardial infarction) [facility]) Active Problems:   Aortic stenosis   CAD (coronary artery disease)   Chronic systolic heart failure (HCC)   Atrial fibrillation ([place])   Coronary artery disease    Chief Complaint   Feels better today, walked around the hall this morning  Subjective   Rapid a-fib yesterday - see Dr. Nelly Laurence note- back on amiodarone with good rate control. Apparently s/p Watchman 1 year ago, now with 2 vessel CABG.  Objective   Vitals:   04/05/17 0400 04/05/17 0500 04/05/17 0600 04/05/17 0740  BP: (!) 98/56 (!) 105/55 (!) 108/57   Pulse: 71 68 71   Resp: 18 19 (!) 26   Temp:    98.2 F (36.8 C)  TempSrc:    Oral  SpO2: 100% 100% 100%   Weight:  201 lb (91.2 kg)    Height:        Intake/Output Summary (Last 24 hours) at 04/05/17 0830 Last data filed at 04/05/17 0700  Gross per 24 hour  Intake          1470.67 ml  Output              725 ml  Net           745.67 ml   Filed Weights   04/03/17 0500 04/04/17 0500 04/05/17 0500  Weight: 196 lb 10.4 oz (89.2 kg) 200 lb 4.8 oz (90.9 kg) 201 lb (91.2 kg)    Physical Exam   General appearance: alert and no distress Neck: no carotid bruit, no JVD and right IJ TLC Lungs: diminished breath sounds bilaterally Heart: regular rate and rhythm, S1, S2 normal, no murmur, click, rub or gallop Abdomen: soft, non-tender; bowel sounds normal; no masses,  no organomegaly Extremities: extremities normal, atraumatic, no cyanosis or edema Pulses: 2+ and symmetric Skin: Skin color, texture, turgor normal. No rashes or lesions Neurologic: Grossly normal Psych: Pleasant  Inpatient Medications    Scheduled Meds: . acetaminophen  1,000 mg Oral Q6H   Or  . acetaminophen (TYLENOL) oral liquid 160 mg/5 mL  1,000 mg Per Tube Q6H  . alfuzosin  10 mg Oral Q breakfast  .  aspirin EC  325 mg Oral Daily   Or  . aspirin  324 mg Per Tube Daily  . bisacodyl  10 mg Oral Daily   Or  . bisacodyl  10 mg Rectal Daily  . docusate sodium  200 mg Oral Daily  . finasteride  5 mg Oral Daily  . insulin aspart  0-24 Units Subcutaneous Q4H  . levothyroxine  112 mcg Oral QAC breakfast  . mouth rinse  15 mL Mouth Rinse BID  . metoprolol tartrate  12.5 mg Oral BID   Or  . metoprolol tartrate  12.5 mg Per Tube BID  . pantoprazole  40 mg Oral Daily  . pravastatin  40 mg Oral Daily  . sodium chloride flush  3 mL Intravenous Q12H    Continuous Infusions: . sodium chloride Stopped (04/03/17 2100)  . sodium chloride    . sodium chloride Stopped (04/03/17 2100)  . amiodarone 30 mg/hr (04/05/17 0700)  . dexmedetomidine (PRECEDEX) IV infusion Stopped (04/01/17 1700)  . lactated ringers    . lactated ringers Stopped (04/02/17 0500)  . lactated ringers 20 mL/hr at 04/05/17 0700  . milrinone 0.0625 mcg/kg/min (04/05/17 0700)  .  nitroGLYCERIN Stopped (04/01/17 1516)  . norepinephrine (LEVOPHED) Adult infusion Stopped (04/04/17 1430)  . phenylephrine (NEO-SYNEPHRINE) Adult infusion Stopped (04/03/17 1336)    PRN Meds: sodium chloride, ipratropium-albuterol, lactated ringers, metoprolol, midazolam, morphine injection, ondansetron (ZOFRAN) IV, oxyCODONE, sodium chloride flush, traMADol   Labs   Results for orders placed or performed during the hospital encounter of 03/30/17 (from the past 48 hour(s))  Cooxemetry Panel (carboxy, met, total hgb, O2 sat)     Status: Abnormal   Collection Time: 04/03/17  9:15 AM  Result Value Ref Range   Total hemoglobin 10.4 (L) 12.0 - 16.0 g/dL   O2 Saturation 73.5 %   Carboxyhemoglobin 1.5 0.5 - 1.5 %   Methemoglobin 0.8 0.0 - 1.5 %  Glucose, capillary     Status: Abnormal   Collection Time: 04/03/17 11:42 AM  Result Value Ref Range   Glucose-Capillary 149 (H) 65 - 99 mg/dL  Glucose, capillary     Status: Abnormal   Collection Time:  04/03/17  3:40 PM  Result Value Ref Range   Glucose-Capillary 204 (H) 65 - 99 mg/dL   Comment 1 Notify RN   Glucose, capillary     Status: Abnormal   Collection Time: 04/03/17  8:13 PM  Result Value Ref Range   Glucose-Capillary 148 (H) 65 - 99 mg/dL   Comment 1 Capillary Specimen    Comment 2 Notify RN   Glucose, capillary     Status: Abnormal   Collection Time: 04/03/17 11:31 PM  Result Value Ref Range   Glucose-Capillary 155 (H) 65 - 99 mg/dL  Glucose, capillary     Status: Abnormal   Collection Time: 04/04/17  4:00 AM  Result Value Ref Range   Glucose-Capillary 176 (H) 65 - 99 mg/dL   Comment 1 Capillary Specimen    Comment 2 Notify RN   Basic metabolic panel     Status: Abnormal   Collection Time: 04/04/17  5:03 AM  Result Value Ref Range   Sodium 131 (L) 135 - 145 mmol/L    Comment: DELTA CHECK NOTED   Potassium 3.9 3.5 - 5.1 mmol/L   Chloride 102 101 - 111 mmol/L   CO2 22 22 - 32 mmol/L   Glucose, Bld 149 (H) 65 - 99 mg/dL   BUN 29 (H) 6 - 20 mg/dL   Creatinine, Ser 1.28 (H) 0.61 - 1.24 mg/dL   Calcium 8.9 8.9 - 10.3 mg/dL   GFR calc non Af Amer 51 (L) >60 mL/min   GFR calc Af Amer 59 (L) >60 mL/min    Comment: (NOTE) The eGFR has been calculated using the CKD EPI equation. This calculation has not been validated in all clinical situations. eGFR's persistently <60 mL/min signify possible Chronic Kidney Disease.    Anion gap 7 5 - 15  CBC     Status: Abnormal   Collection Time: 04/04/17  5:03 AM  Result Value Ref Range   WBC 11.1 (H) 4.0 - 10.5 K/uL   RBC 3.53 (L) 4.22 - 5.81 MIL/uL   Hemoglobin 9.8 (L) 13.0 - 17.0 g/dL   HCT 30.2 (L) 39.0 - 52.0 %   MCV 85.6 78.0 - 100.0 fL   MCH 27.8 26.0 - 34.0 pg   MCHC 32.5 30.0 - 36.0 g/dL   RDW 14.9 11.5 - 15.5 %   Platelets 89 (L) 150 - 400 K/uL    Comment: CONSISTENT WITH PREVIOUS RESULT  Glucose, capillary     Status: Abnormal   Collection Time: 04/04/17  7:52 AM  Result Value Ref Range   Glucose-Capillary 142  (H) 65 - 99 mg/dL   Comment 1 Capillary Specimen    Comment 2 Notify RN    Comment 3 Document in Chart   Cooxemetry Panel (carboxy, met, total hgb, O2 sat)     Status: Abnormal   Collection Time: 04/04/17 10:28 AM  Result Value Ref Range   Total hemoglobin 10.2 (L) 12.0 - 16.0 g/dL   O2 Saturation 36.4 %   Carboxyhemoglobin 1.6 (H) 0.5 - 1.5 %   Methemoglobin 0.8 0.0 - 1.5 %  Glucose, capillary     Status: Abnormal   Collection Time: 04/04/17 11:25 AM  Result Value Ref Range   Glucose-Capillary 166 (H) 65 - 99 mg/dL   Comment 1 Capillary Specimen    Comment 2 Notify RN    Comment 3 Document in Chart   Glucose, capillary     Status: Abnormal   Collection Time: 04/04/17  4:37 PM  Result Value Ref Range   Glucose-Capillary 159 (H) 65 - 99 mg/dL   Comment 1 Capillary Specimen    Comment 2 Notify RN    Comment 3 Document in Chart   Glucose, capillary     Status: Abnormal   Collection Time: 04/04/17  9:55 PM  Result Value Ref Range   Glucose-Capillary 137 (H) 65 - 99 mg/dL   Comment 1 Capillary Specimen    Comment 2 Notify RN   CBC     Status: Abnormal   Collection Time: 04/05/17  4:00 AM  Result Value Ref Range   WBC 7.5 4.0 - 10.5 K/uL   RBC 3.49 (L) 4.22 - 5.81 MIL/uL   Hemoglobin 9.7 (L) 13.0 - 17.0 g/dL   HCT 94.0 (L) 27.0 - 04.3 %   MCV 86.0 78.0 - 100.0 fL   MCH 27.8 26.0 - 34.0 pg   MCHC 32.3 30.0 - 36.0 g/dL   RDW 29.7 10.2 - 46.8 %   Platelets 101 (L) 150 - 400 K/uL    Comment: CONSISTENT WITH PREVIOUS RESULT  Comprehensive metabolic panel     Status: Abnormal   Collection Time: 04/05/17  4:00 AM  Result Value Ref Range   Sodium 134 (L) 135 - 145 mmol/L   Potassium 4.2 3.5 - 5.1 mmol/L   Chloride 101 101 - 111 mmol/L   CO2 25 22 - 32 mmol/L   Glucose, Bld 134 (H) 65 - 99 mg/dL   BUN 30 (H) 6 - 20 mg/dL   Creatinine, Ser 3.88 (H) 0.61 - 1.24 mg/dL   Calcium 8.8 (L) 8.9 - 10.3 mg/dL   Total Protein 5.3 (L) 6.5 - 8.1 g/dL   Albumin 2.8 (L) 3.5 - 5.0 g/dL    AST 37 15 - 41 U/L   ALT 32 17 - 63 U/L   Alkaline Phosphatase 54 38 - 126 U/L   Total Bilirubin 0.6 0.3 - 1.2 mg/dL   GFR calc non Af Amer 49 (L) >60 mL/min   GFR calc Af Amer 57 (L) >60 mL/min    Comment: (NOTE) The eGFR has been calculated using the CKD EPI equation. This calculation has not been validated in all clinical situations. eGFR's persistently <60 mL/min signify possible Chronic Kidney Disease.    Anion gap 8 5 - 15  Glucose, capillary     Status: Abnormal   Collection Time: 04/05/17  7:41 AM  Result Value Ref Range   Glucose-Capillary 146 (H) 65 - 99 mg/dL   Comment  1 Notify RN     ECG   No EKG today  Telemetry   A-fib with CVR - Personally Reviewed  Radiology    Dg Chest Port 1 View  Result Date: 04/05/2017 CLINICAL DATA:  82 year old male CABG postop day 4. Previous right middle and lower lobectomy. EXAM: PORTABLE CHEST 1 VIEW COMPARISON:  04/04/2017 and earlier. FINDINGS: Portable AP semi upright view at at 0604 hours. Stable right IJ approach introducer sheath. Stable postoperative reduced right lung volume. Confluent left lung base opacity has progressed since 04/03/2017. No superimposed pulmonary edema or pneumothorax. Stable cardiac size and mediastinal contours. Stable left chest cardiac pacemaker. IMPRESSION: 1. Increasing left lung base opacity since 04/03/2017 felt due to a combination of small left pleural effusion and left lower lobe collapse or consolidation. 2. No other acute cardiopulmonary abnormality. Electronically Signed   By: Genevie Ann M.D.   On: 04/05/2017 07:22   Dg Chest Port 1 View  Result Date: 04/04/2017 CLINICAL DATA:  82 year old male status post CABG postop day 3. Previous right middle and lower lobectomy. EXAM: PORTABLE CHEST 1 VIEW COMPARISON:  04/03/2017 and earlier. FINDINGS: Portable AP semi upright view at at 0448 hours. Left chest tube removed since yesterday. No pneumothorax identified. Stable lung volumes. Stable cardiac size and  mediastinal contours. Right IJ introducer sheath remains. Stable left chest AICD. Mildly increased streaky opacity at the left lung base. Possible small left pleural effusion. Stable right lung. IMPRESSION: 1. Left chest tube removed with no pneumothorax. 2. Mildly increased left lung base atelectasis. Possible small left pleural effusion. 3. Prior right partial pneumonectomy. Electronically Signed   By: Genevie Ann M.D.   On: 04/04/2017 07:31    Cardiac Studies   Result status: Final result   Left ventricle: Normal wall thickness. Cavity is mildly dilated. LV systolic function is moderately reduced with an EF of 35-40%. Wall motion is abnormal. Diffuse hypokinesis with regional variation. The inferior wall is severely hypokinetic relative to the remaining segments No thrombus present.  Aortic valve: The valve is trileaflet. Moderate valve thickening present. Moderate valve calcification present. Moderately decreased leaflet separation. Mild stenosis. Mild regurgitation. No AV vegetation.  Right ventricle: Normal wall thickness. Cavity is moderately dilated. Mildly reduced systolic function.  Tricuspid valve: Valve has a dilated annulus. Mild regurgitation. The tricuspid valve regurgitation jet is central.  Left atrium: ASD or PFO closure device in interatrial septum.  Mitral valve: Dilated mitral annulus. Mild leaflet thickening is present. Mild leaflet calcification is present. Mild mitral annular calcification. Mild regurgitation.    Assessment   1. Principal Problem: 2.   NSTEMI (non-ST elevated myocardial infarction) ([place]) 3. Active Problems: 4.   Aortic stenosis 5.   CAD (coronary artery disease) 6.   Chronic systolic heart failure ([place]) 7.   Atrial fibrillation ([place]) 8.   Coronary artery disease 9.   Plan   1. Mr. Beals has better rate control today, but persists in a-fib. I would continue IV amiodarone at least until he is off of pressors and then consider transition to po  amiodarone. He will not need anticoagulation d/t prior Watchman LAA occluder. He is about 8L positive, given lasix yesterday with some improvement. LVEF down to 35-40% on intra-op TEE. Would continue diuresis today.  Time Spent Directly with Patient:  25 minutes  Length of Stay:  LOS: 5 days   Pixie Casino, MD, [place]  Attending Cardiologist  Direct Dial: [phone]  Fax: [phone]  Website:  [email or web address]  Nadean Corwin Hilty 04/05/2017, 8:30 AM

## 2017-04-06 ENCOUNTER — Inpatient Hospital Stay (HOSPITAL_COMMUNITY): Payer: Medicare Other

## 2017-04-06 LAB — BASIC METABOLIC PANEL
Anion gap: 5 (ref 5–15)
BUN: 42 mg/dL — ABNORMAL HIGH (ref 6–20)
CO2: 26 mmol/L (ref 22–32)
Calcium: 8.9 mg/dL (ref 8.9–10.3)
Chloride: 100 mmol/L — ABNORMAL LOW (ref 101–111)
Creatinine, Ser: 1.53 mg/dL — ABNORMAL HIGH (ref 0.61–1.24)
GFR calc Af Amer: 48 mL/min — ABNORMAL LOW (ref >60)
GFR calc non Af Amer: 41 mL/min — ABNORMAL LOW (ref >60)
Glucose, Bld: 120 mg/dL — ABNORMAL HIGH (ref 65–99)
Potassium: 3.7 mmol/L (ref 3.5–5.1)
Sodium: 131 mmol/L — ABNORMAL LOW (ref 135–145)

## 2017-04-06 LAB — CBC
HCT: 28.9 % — ABNORMAL LOW (ref 39.0–52.0)
Hemoglobin: 9.3 g/dL — ABNORMAL LOW (ref 13.0–17.0)
MCH: 27.4 pg (ref 26.0–34.0)
MCHC: 32.2 g/dL (ref 30.0–36.0)
MCV: 85.3 fL (ref 78.0–100.0)
Platelets: 133 10*3/uL — ABNORMAL LOW (ref 150–400)
RBC: 3.39 MIL/uL — ABNORMAL LOW (ref 4.22–5.81)
RDW: 15.4 % (ref 11.5–15.5)
WBC: 7 10*3/uL (ref 4.0–10.5)

## 2017-04-06 LAB — GLUCOSE, CAPILLARY
Glucose-Capillary: 121 mg/dL — ABNORMAL HIGH (ref 65–99)
Glucose-Capillary: 123 mg/dL — ABNORMAL HIGH (ref 65–99)
Glucose-Capillary: 103 mg/dL — ABNORMAL HIGH (ref 65–99)
Glucose-Capillary: 100 mg/dL — ABNORMAL HIGH (ref 65–99)

## 2017-04-06 LAB — COOXEMETRY PANEL
Carboxyhemoglobin: 1.2 % (ref 0.5–1.5)
Methemoglobin: 1 % (ref 0.0–1.5)
O2 Saturation: 59.2 %
Total hemoglobin: 9.4 g/dL — ABNORMAL LOW (ref 12.0–16.0)

## 2017-04-06 MED ORDER — BISACODYL 10 MG RE SUPP: 10.0000 mg | Freq: Every day | RECTAL | Status: DC | PRN

## 2017-04-06 MED ORDER — POTASSIUM CHLORIDE 10 MEQ/50ML IV SOLN
10.0000 meq | INTRAVENOUS | Status: DC | PRN
  Administered 2017-04-06: 10 meq via INTRAVENOUS
  Filled 2017-04-06: qty 50

## 2017-04-06 MED ORDER — AMIODARONE HCL 200 MG PO TABS: 200.0000 mg | ORAL_TABLET | Freq: Every day | ORAL | Status: DC

## 2017-04-06 MED ORDER — MOVING RIGHT ALONG BOOK
Freq: Once | Status: DC
  Filled 2017-04-06: qty 1

## 2017-04-06 MED ORDER — SODIUM CHLORIDE 0.9% FLUSH: 3.0000 mL | INTRAVENOUS | Status: DC | PRN

## 2017-04-06 MED ORDER — ENOXAPARIN SODIUM 30 MG/0.3ML ~~LOC~~ SOLN
30.0000 mg | SUBCUTANEOUS | Status: DC
  Administered 2017-04-06 – 2017-04-09 (×4): 30 mg via SUBCUTANEOUS
  Filled 2017-04-06 (×4): qty 0.3

## 2017-04-06 MED ORDER — BISACODYL 5 MG PO TBEC: 10.0000 mg | DELAYED_RELEASE_TABLET | Freq: Every day | ORAL | Status: DC | PRN

## 2017-04-06 MED ORDER — ONDANSETRON HCL 4 MG/2ML IJ SOLN: 4.0000 mg | Freq: Four times a day (QID) | INTRAMUSCULAR | Status: DC | PRN

## 2017-04-06 MED ORDER — GUAIFENESIN ER 600 MG PO TB12
600.0000 mg | ORAL_TABLET | Freq: Two times a day (BID) | ORAL | Status: DC | PRN
  Administered 2017-04-08: 600 mg via ORAL
  Filled 2017-04-06: qty 1

## 2017-04-06 MED ORDER — SODIUM CHLORIDE 0.9% FLUSH
3.0000 mL | Freq: Two times a day (BID) | INTRAVENOUS | Status: DC
  Administered 2017-04-06 – 2017-04-08 (×3): 3 mL via INTRAVENOUS

## 2017-04-06 MED ORDER — AMIODARONE HCL 200 MG PO TABS
200.0000 mg | ORAL_TABLET | Freq: Two times a day (BID) | ORAL | Status: DC
  Filled 2017-04-06: qty 1

## 2017-04-06 MED ORDER — ONDANSETRON HCL 4 MG PO TABS: 4.0000 mg | ORAL_TABLET | Freq: Four times a day (QID) | ORAL | Status: DC | PRN

## 2017-04-06 MED ORDER — PANTOPRAZOLE SODIUM 40 MG PO TBEC
40.0000 mg | DELAYED_RELEASE_TABLET | Freq: Every day | ORAL | Status: DC
  Administered 2017-04-06 – 2017-04-09 (×4): 40 mg via ORAL
  Filled 2017-04-06 (×4): qty 1

## 2017-04-06 MED ORDER — INSULIN ASPART 100 UNIT/ML ~~LOC~~ SOLN
0.0000 [IU] | Freq: Three times a day (TID) | SUBCUTANEOUS | Status: DC
  Administered 2017-04-06: 2 [IU] via SUBCUTANEOUS

## 2017-04-06 MED ORDER — ASPIRIN EC 81 MG PO TBEC
81.0000 mg | DELAYED_RELEASE_TABLET | Freq: Every day | ORAL | Status: DC
  Administered 2017-04-06 – 2017-04-09 (×4): 81 mg via ORAL
  Filled 2017-04-06 (×4): qty 1

## 2017-04-06 MED ORDER — METOPROLOL TARTRATE 12.5 MG HALF TABLET
12.5000 mg | ORAL_TABLET | Freq: Two times a day (BID) | ORAL | Status: DC
  Administered 2017-04-06 – 2017-04-09 (×7): 12.5 mg via ORAL
  Filled 2017-04-06 (×7): qty 1

## 2017-04-06 MED ORDER — DOCUSATE SODIUM 100 MG PO CAPS
200.0000 mg | ORAL_CAPSULE | Freq: Every day | ORAL | Status: DC
  Administered 2017-04-06 – 2017-04-09 (×3): 200 mg via ORAL
  Filled 2017-04-06 (×4): qty 2

## 2017-04-06 MED ORDER — AMIODARONE HCL 200 MG PO TABS
200.0000 mg | ORAL_TABLET | Freq: Two times a day (BID) | ORAL | Status: DC
  Administered 2017-04-06 – 2017-04-09 (×7): 200 mg via ORAL
  Filled 2017-04-06 (×6): qty 1

## 2017-04-06 MED ORDER — SODIUM CHLORIDE 0.9 % IV SOLN: 250.0000 mL | INTRAVENOUS | Status: DC | PRN

## 2017-04-06 MED ORDER — FUROSEMIDE 20 MG PO TABS
20.0000 mg | ORAL_TABLET | Freq: Every day | ORAL | Status: AC
  Administered 2017-04-06 – 2017-04-08 (×3): 20 mg via ORAL
  Filled 2017-04-06 (×3): qty 1

## 2017-04-06 MED ORDER — POTASSIUM CHLORIDE CRYS ER 10 MEQ PO TBCR
10.0000 meq | EXTENDED_RELEASE_TABLET | Freq: Every day | ORAL | Status: AC
  Administered 2017-04-06 – 2017-04-08 (×3): 10 meq via ORAL
  Filled 2017-04-06 (×3): qty 1

## 2017-04-06 NOTE — Evaluation (Signed)
Occupational Therapy Evaluation Patient Details Name: Steven Everett MRN: [id number] DOB: January 30, 1935 Today's Date: 04/06/2017    History of Present Illness Pt is an 82 y/o male s/p CABG secondary to NSTEMI. PMH including but not limited to CAD, DM, R lung cancer, liver cirrhosis, HTN, PAF, and cardiac defibrillator placement in 2014.   Clinical Impression   Pt was independent and active prior to admission. Presents with impaired balance. Educated pt and son in sternal precautions related to IADL. Pt currently requires minimum assistance for mobility with RW and ADL. He lives alone. Recommending short term rehab prior to return home. Pt and son are in agreement. Will follow acutely.    Follow Up Recommendations  SNF;Supervision/Assistance - 24 hour (HHOT if pt does not go to SNF)    Equipment Recommendations  3 in 1 bedside commode    Recommendations for Other Services       Precautions / Restrictions Precautions Precautions: Fall;Sternal Precaution Comments: OT reviewed sternal precautions related to ADL and IADL. Restrictions Weight Bearing Restrictions: No      Mobility Bed Mobility Overal bed mobility: Needs Assistance Bed Mobility: Rolling;Sidelying to Sit;Sit to Sidelying Rolling: Supervision Sidelying to sit: Min guard     Sit to sidelying: Min guard General bed mobility comments: cues for technique, increased time  Transfers Overall transfer level: Needs assistance Equipment used: 2 person hand held assist Transfers: Sit to/from Stand Sit to Stand: Min assist         General transfer comment: min assist to rise and steady, hands on knees    Balance Overall balance assessment: Needs assistance Sitting-balance support: Feet supported Sitting balance-Leahy Scale: Good     Standing balance support: During functional activity;Bilateral upper extremity supported Standing balance-Leahy Scale: Poor Standing balance comment: pt reliant on external supports  during dynamic activities                           ADL either performed or assessed with clinical judgement   ADL Overall ADL's : Needs assistance/impaired Eating/Feeding: Independent;Sitting   Grooming: Wash/dry hands;Standing;Min guard   Upper Body Bathing: Sitting;Minimal assistance   Lower Body Bathing: Minimal assistance;Sit to/from stand   Upper Body Dressing : Set up;Sitting   Lower Body Dressing: Minimal assistance;Sit to/from stand   Toilet Transfer: Minimal assistance;Ambulation;RW Toilet Transfer Details (indicate cue type and reason): stood to urinate Toileting- Water quality scientist and Hygiene: Min guard       Functional mobility during ADLs: Minimal assistance;Rolling walker (within room) General ADL Comments: Educated in pt's need for assist with IADL in order to maintain sternal precautions ie:  avoiding lifting heavy laundry, garbage, pushing vacuum, carrying grocery bags.     Vision Patient Visual Report: No change from baseline       Perception     Praxis      Pertinent Vitals/Pain Pain Assessment: No/denies pain     Hand Dominance Right   Extremity/Trunk Assessment Upper Extremity Assessment Upper Extremity Assessment: Overall WFL for tasks assessed (no formally tested due to sternal precautions)   Lower Extremity Assessment Lower Extremity Assessment: Defer to PT evaluation       Communication Communication Communication: HOH   Cognition Arousal/Alertness: Awake/alert Behavior During Therapy: WFL for tasks assessed/performed Overall Cognitive Status: Within Functional Limits for tasks assessed  General Comments  sternal incision intact    Exercises     Shoulder Instructions      Home Living Family/patient expects to be discharged to:: Private residence Living Arrangements: Alone Available Help at Discharge: Family;Available PRN/intermittently (son [place]) Type of Home: Apartment Home Access: Level entry     Home Layout: One level     Bathroom Shower/Tub: Teacher, early years/pre: Standard     Home Equipment: Cane - single point;Shower seat          Prior Functioning/Environment Level of Independence: Independent                 OT Problem List: Decreased strength;Decreased activity tolerance;Impaired balance (sitting and/or standing);Pain;Decreased knowledge of precautions;Decreased knowledge of use of DME or AE      OT Treatment/Interventions: Self-care/ADL training;Energy conservation;DME and/or AE instruction;Therapeutic activities;Patient/family education    OT Goals(Current goals can be found in the care plan section) Acute Rehab OT Goals Patient Stated Goal: return home OT Goal Formulation: With patient Time For Goal Achievement: 04/29/2017 Potential to Achieve Goals: Good ADL Goals Pt Will Perform Grooming: with modified independence;standing Pt Will Perform Lower Body Bathing: with modified independence;sit to/from stand Pt Will Perform Lower Body Dressing: with modified independence;sit to/from stand Pt Will Transfer to Toilet: with modified independence;ambulating;bedside commode (over toilet) Pt Will Perform Toileting - Clothing Manipulation and hygiene: with modified independence;sit to/from stand Pt Will Perform Tub/Shower Transfer: Tub transfer;ambulating;with modified independence;shower seat;rolling walker Additional ADL Goal #1: Pt will generalize sternal precautions in ADL and mobility independently.  OT Frequency: Min 2X/week   Barriers to D/C: Decreased caregiver support          Co-evaluation              AM-PAC PT "6 Clicks" Daily Activity     Outcome Measure Help from another person eating meals?: None Help from another person taking care of personal grooming?: A Little Help from another person toileting, which includes using toliet, bedpan, or urinal?: A Little Help  from another person bathing (including washing, rinsing, drying)?: A Little Help from another person to put on and taking off regular upper body clothing?: None Help from another person to put on and taking off regular lower body clothing?: A Little 6 Click Score: 20   End of Session Equipment Utilized During Treatment: Gait belt;Rolling walker  Activity Tolerance: Patient tolerated treatment well Patient left: in bed;with call bell/phone within reach;with family/visitor present  OT Visit Diagnosis: Unsteadiness on feet (R26.81)                Time: 1430-1450 OT Time Calculation (min): 20 min Charges:  OT General Charges $OT Visit: 1 Procedure OT Evaluation $OT Eval Moderate Complexity: 1 Procedure G-Codes:       Malka So 04/06/2017, 3:08 PM  [phone]

## 2017-04-06 NOTE — Progress Notes (Signed)
Physical Therapy Treatment Patient Details Name: Steven Everett MRN: [id number] DOB: 15-May-1935 Today's Date: 04/06/2017    History of Present Illness Pt is an 82 y/o male s/p CABG secondary to NSTEMI. PMH including but not limited to CAD, DM, R lung cancer, liver cirrhosis, HTN, PAF, and cardiac defibrillator placement in 2014.    PT Comments    Pt progressing well and significantly improved ambulation tolerance. Acute PT to con't to follow.   Follow Up Recommendations  Home health PT;Supervision/Assistance - 24 hour     Equipment Recommendations  Rolling walker with 5" wheels    Recommendations for Other Services       Precautions / Restrictions Precautions Precautions: Fall;Sternal Precaution Comments: PT reviewed sternal precautions with pt throughout session Restrictions Weight Bearing Restrictions: No    Mobility  Bed Mobility               General bed mobility comments: pt up in chair upon PT arrival  Transfers Overall transfer level: Needs assistance Equipment used: 2 person hand held assist Transfers: Sit to/from Stand Sit to Stand: Min assist;+2 safety/equipment         General transfer comment: increased time, held cardiac pillow, good technique, min A x2 to rise from bed and for stability with transition  Ambulation/Gait Ambulation/Gait assistance: Min assist;+2 safety/equipment Ambulation Distance (Feet): 600 Feet Assistive device: Rolling walker (2 wheeled) Gait Pattern/deviations: Step-through pattern;Decreased stance time - left   Gait velocity interpretation: Below normal speed for age/gender General Gait Details: v/c's to slow down, 1 standing rest break   Stairs            Wheelchair Mobility    Modified Rankin (Stroke Patients Only)       Balance Overall balance assessment: Needs assistance Sitting-balance support: Feet supported       Standing balance support: During functional activity;Bilateral upper extremity  supported Standing balance-Leahy Scale: Poor Standing balance comment: pt reliant on external supports                            Cognition Arousal/Alertness: Awake/alert Behavior During Therapy: WFL for tasks assessed/performed Overall Cognitive Status: Within Functional Limits for tasks assessed                                        Exercises      General Comments General comments (skin integrity, edema, etc.): sternal incision intact      Pertinent Vitals/Pain Pain Assessment: No/denies pain    Home Living                      Prior Function            PT Goals (current goals can now be found in the care plan section) Acute Rehab PT Goals Patient Stated Goal: return home Progress towards PT goals: Progressing toward goals    Frequency    Min 3X/week      PT Plan Current plan remains appropriate    Co-evaluation              AM-PAC PT "6 Clicks" Daily Activity  Outcome Measure  Difficulty turning over in bed (including adjusting bedclothes, sheets and blankets)?: A Little Difficulty moving from lying on back to sitting on the side of the bed? : A Little Difficulty sitting down on and standing  up from a chair with arms (e.g., wheelchair, bedside commode, etc,.)?: A Little Help needed moving to and from a bed to chair (including a wheelchair)?: A Little Help needed walking in hospital room?: A Little Help needed climbing 3-5 steps with a railing? : A Little 6 Click Score: 18    End of Session Equipment Utilized During Treatment: Gait belt Activity Tolerance: Patient tolerated treatment well Patient left: in chair;with call bell/phone within reach Nurse Communication: Mobility status PT Visit Diagnosis: Unsteadiness on feet (R26.81);Other abnormalities of gait and mobility (R26.89)     Time: 1010-1036 PT Time Calculation (min) (ACUTE ONLY): 26 min  Charges:  $Gait Training: 23-37 mins                    G  Codes:       Kittie Plater, PT, DPT Pager #: [phone] Office #: [phone]    Council 04/06/2017, 1:27 PM

## 2017-04-06 NOTE — Progress Notes (Signed)
Progress Note  Patient Name: Steven Everett Date of Encounter: 04/06/2017  Primary Cardiologist: Dr. Stanford Breed  Subjective   Pt is feeling much better. Had breathing difficulty over the weekend, but better now. Has been up walking with CR with mild shortness of breath, but progressing.   Inpatient Medications    Scheduled Meds: . alfuzosin  10 mg Oral Q breakfast  . amiodarone  200 mg Oral Q12H   Followed by  . [START ON 04/14/2017] amiodarone  200 mg Oral Daily  . aspirin EC  81 mg Oral Daily  . Chlorhexidine Gluconate Cloth  6 each Topical Daily  . docusate sodium  200 mg Oral Daily  . enoxaparin (LOVENOX) injection  30 mg Subcutaneous Q24H  . finasteride  5 mg Oral Daily  . furosemide  20 mg Oral Daily  . insulin aspart  0-24 Units Subcutaneous TID AC & HS  . levothyroxine  112 mcg Oral QAC breakfast  . mouth rinse  15 mL Mouth Rinse BID  . metoprolol tartrate  12.5 mg Oral BID  . moving right along book   Does not apply Once  . pantoprazole  40 mg Oral QAC breakfast  . potassium chloride  10 mEq Oral Daily  . pravastatin  40 mg Oral Daily  . sodium chloride flush  10-40 mL Intracatheter Q12H  . sodium chloride flush  3 mL Intravenous Q12H   Continuous Infusions: . sodium chloride     PRN Meds: sodium chloride, bisacodyl **OR** bisacodyl, guaiFENesin, ipratropium-albuterol, ondansetron **OR** ondansetron (ZOFRAN) IV, sodium chloride flush, sodium chloride flush, traMADol   Vital Signs    Vitals:   04/06/17 0500 04/06/17 0600 04/06/17 0700 04/06/17 0800  BP: 112/63 106/61 128/62 123/65  Pulse: 80 77 68 79  Resp: 16 16 (!) 25 18  Temp:   97.4 F (36.3 C)   TempSrc:   Oral   SpO2: 95% 95% 99% 99%  Weight:      Height:        Intake/Output Summary (Last 24 hours) at 04/06/17 0948 Last data filed at 04/06/17 0800  Gross per 24 hour  Intake           1491.9 ml  Output              530 ml  Net            961.9 ml   Filed Weights   04/04/17 0500 04/05/17 0500  04/06/17 0416  Weight: 200 lb 4.8 oz (90.9 kg) 201 lb (91.2 kg) 205 lb 14.6 oz (93.4 kg)    Telemetry    Sinus rhythm in the 70's-90's. - Personally Reviewed  ECG    No new tracings.   Physical Exam   GEN: No acute distress.   Neck: No JVD Cardiac: RRR, 2/6 systolic murmur in LUSB.Marland Kitchen  Respiratory: Lungs with expiratory wheezes, no rales GI: Soft, nontender, non-distended  MS: Trace generalized edema Neuro:  Nonfocal  Psych: Normal affect   Labs    Chemistry Recent Labs Lab 04/04/17 0503 04/05/17 0400 04/06/17 0528  NA 131* 134* 131*  K 3.9 4.2 3.7  CL 102 101 100*  CO2 '22 25 26  '$ GLUCOSE 149* 134* 120*  BUN 29* 30* 42*  CREATININE 1.28* 1.32* 1.53*  CALCIUM 8.9 8.8* 8.9  PROT  --  5.3*  --   ALBUMIN  --  2.8*  --   AST  --  37  --   ALT  --  32  --  ALKPHOS  --  54  --   BILITOT  --  0.6  --   GFRNONAA 51* 49* 41*  GFRAA 59* 57* 48*  ANIONGAP '7 8 5     '$ Hematology Recent Labs Lab 04/04/17 0503 04/05/17 0400 04/06/17 0528  WBC 11.1* 7.5 7.0  RBC 3.53* 3.49* 3.39*  HGB 9.8* 9.7* 9.3*  HCT 30.2* 30.0* 28.9*  MCV 85.6 86.0 85.3  MCH 27.8 27.8 27.4  MCHC 32.5 32.3 32.2  RDW 14.9 15.1 15.4  PLT 89* 101* 133*    Cardiac Enzymes Recent Labs Lab 03/31/17 0448  TROPONINI 0.29*    Recent Labs Lab 03/30/17 2016  TROPIPOC 0.31*     BNPNo results for input(s): BNP, PROBNP in the last 168 hours.   DDimer No results for input(s): DDIMER in the last 168 hours.   Radiology    Dg Chest Port 1 View  Result Date: 04/06/2017 CLINICAL DATA:  Status post chest tube removal.  Shortness of breath EXAM: PORTABLE CHEST 1 VIEW COMPARISON:  Apr 05, 2017 FINDINGS: Central catheter tip is essentially at the cavoatrial junction. Pacemaker leads are attached to the right atrium and right ventricle. Temporary pacemaker wires are attached to the right heart. There is a stable small right apical pneumothorax without tension component. There are pleural effusions  bilaterally with atelectatic change in lung bases, unchanged. Heart is upper normal in size with pulmonary vascularity within normal limits. There is aortic atherosclerosis. There is degenerative change in each shoulder. IMPRESSION: Stable central catheter position. Persistent pleural effusions bilaterally with bibasilar atelectasis. Stable cardiac silhouette. Small right apical pneumothorax, not felt to be appreciably changed, without tension component. There is aortic atherosclerosis. Electronically Signed   By: Lowella Grip III M.D.   On: 04/06/2017 07:50   Dg Chest Port 1 View  Result Date: 04/05/2017 CLINICAL DATA:  Line placement. EXAM: PORTABLE CHEST 1 VIEW COMPARISON:  04/05/2017 FINDINGS: There is a right arm PICC line with tip in the projection of the cavoatrial junction. Previous median sternotomy and CABG procedure. Aortic atherosclerosis noted. Bilateral pleural effusions are identified, right greater than left. IMPRESSION: 1. Right arm PICC line tip is at the cavoatrial junction. 2. No change in CHF pattern. Electronically Signed   By: Kerby Moors M.D.   On: 04/05/2017 12:20   Dg Chest Port 1 View  Result Date: 04/05/2017 CLINICAL DATA:  Status post CABG postop day 4. EXAM: PORTABLE CHEST 1 VIEW COMPARISON:  04/05/2017 FINDINGS: Cardiomegaly and CABG changes again identified. Left-sided pacemaker is unchanged. Two right IJ central venous catheters are present with tips overlying the upper right atrium superior cavoatrial junction. Bilateral pleural effusions, right greater than left and bibasilar atelectasis noted. There is no evidence of pneumothorax. IMPRESSION: No significant change status post CABG, with support apparatus as described. No evidence of pneumothorax. Electronically Signed   By: Margarette Canada M.D.   On: 04/05/2017 10:04   Dg Chest Port 1 View  Result Date: 04/05/2017 CLINICAL DATA:  82 year old male CABG postop day 4. Previous right middle and lower lobectomy. EXAM:  PORTABLE CHEST 1 VIEW COMPARISON:  04/04/2017 and earlier. FINDINGS: Portable AP semi upright view at at 0604 hours. Stable right IJ approach introducer sheath. Stable postoperative reduced right lung volume. Confluent left lung base opacity has progressed since 04/03/2017. No superimposed pulmonary edema or pneumothorax. Stable cardiac size and mediastinal contours. Stable left chest cardiac pacemaker. IMPRESSION: 1. Increasing left lung base opacity since 04/03/2017 felt due to a combination of small  left pleural effusion and left lower lobe collapse or consolidation. 2. No other acute cardiopulmonary abnormality. Electronically Signed   By: Genevie Ann M.D.   On: 04/05/2017 07:22    Cardiac Studies   TEE 04/01/17  Left ventricle: Normal wall thickness. Cavity is mildly dilated. LV systolic function is moderately reduced with an EF of 35-40%. Wall motion is abnormal. Diffuse hypokinesis with regional variation. The inferior wall is severely hypokinetic relative to the remaining segments No thrombus present.  Aortic valve: The valve is trileaflet. Moderate valve thickening present. Moderate valve calcification present. Moderately decreased leaflet separation. Mild stenosis. Mild regurgitation. No AV vegetation.  Right ventricle: Normal wall thickness. Cavity is moderately dilated. Mildly reduced systolic function.  Tricuspid valve: Valve has a dilated annulus. Mild regurgitation. The tricuspid valve regurgitation jet is central.  Left atrium: ASD or PFO closure device in interatrial septum.  Mitral valve: Dilated mitral annulus. Mild leaflet thickening is present. Mild leaflet calcification is present. Mild mitral annular calcification. Mild regurgitation.     Patient Profile     82 y.o. male with history of LV systolic dysfunction (09-62% 01/2016) and subsequent recovery, CAD (40% LM, 80% mid LAD, 70% RCA, 03/2013), moderate AS, pAF s/p Watchman 01/2016, ICD 08/2013,  cirrhosis, who presents with  intermittent CP x 1 week. Elevated troonin 0.29. Non-STEMI.  Assessment & Plan    CAD -Found to have 2 vessel CAD and CABG on 04/01/17. LIMA to diagonal, SVG to RCA  -Making good progress with cardiac rehab.  -On BB, aspirin, statin.  Paroxysmal Atrial fibrillation  -On amiodarone IV load. Changed to oral dosing today. Metoprolol 12.5 mg bid. -Maintaining sinus rhythm in the 60's-70's. Occ low BP.  Acute renal insufficiency -SCr at highest today 1.53. Received lasix 40 mg IV on 5/5 and 5/6. Now on lasix 20 mg oral daily.   Systolic heart failure -Previous Echo 01/2017 with EF 60-65%. Current TEE showed EF 35-40% intraoperatively.  -Had some increased shortness of breath and wheezing over the weekend, treated with one dose of IV lasix and nebulizer. -Milrinone 1.5 ml/hr, dc'd this am. SBP mostly in the 90's-110's, occ low BP. -No rales or dyspnea today.  Hyperlipidemia -Pravastatin 40 mg  Signed, Daune Perch, NP  04/06/2017, 9:48 AM    Patient examined chart reviewed. Slow progress post CABG Sternum healing well no rub lungs clear Wean milrinone Telemetry with NSR Plan per CVTS  Jenkins Rouge

## 2017-04-06 NOTE — Progress Notes (Signed)
  Amiodarone Drug - Drug Interaction Consult Note  Recommendations: - Monitor HR with concomitant amio and metoprolol - Monitor QTc with amio and alfuzosin (QTc 499 on 5/3) - Monitor K and Mg with amio furosemide  Amiodarone is metabolized by the cytochrome P450 system and therefore has the potential to cause many drug interactions. Amiodarone has an average plasma half-life of 50 days (range 20 to 100 days).   There is potential for drug interactions to occur several weeks or months after stopping treatment and the onset of drug interactions may be slow after initiating amiodarone.   '[x]'$  Statins: Increased risk of myopathy. Simvastatin- restrict dose to '20mg'$  daily. Other statins: counsel patients to report any muscle pain or weakness immediately.  '[]'$  Anticoagulants: Amiodarone can increase anticoagulant effect. Consider warfarin dose reduction. Patients should be monitored closely and the dose of anticoagulant altered accordingly, remembering that amiodarone levels take several weeks to stabilize.  '[]'$  Antiepileptics: Amiodarone can increase plasma concentration of phenytoin, the dose should be reduced. Note that small changes in phenytoin dose can result in large changes in levels. Monitor patient and counsel on signs of toxicity.  '[x]'$  Beta blockers: increased risk of bradycardia, AV block and myocardial depression. Sotalol - avoid concomitant use.  '[]'$   Calcium channel blockers (diltiazem and verapamil): increased risk of bradycardia, AV block and myocardial depression.  '[]'$   Cyclosporine: Amiodarone increases levels of cyclosporine. Reduced dose of cyclosporine is recommended.  '[]'$  Digoxin dose should be halved when amiodarone is started.  '[]'$  Diuretics: increased risk of cardiotoxicity if hypokalemia occurs.  '[]'$  Oral hypoglycemic agents (glyburide, glipizide, glimepiride): increased risk of hypoglycemia. Patient's glucose levels should be monitored closely when initiating amiodarone  therapy.   '[x]'$  Drugs that prolong the QT interval:  Torsades de pointes risk may be increased with concurrent use - avoid if possible.  Monitor QTc, also keep magnesium/potassium WNL if concurrent therapy can't be avoided. Marland Kitchen Antibiotics: e.g. fluoroquinolones, erythromycin. . Antiarrhythmics: e.g. quinidine, procainamide, disopyramide, sotalol. . Antipsychotics: e.g. phenothiazines, haloperidol.  . Lithium, tricyclic antidepressants, and methadone.  Thank You, Demetrius Charity, PharmD Acute Care Pharmacy Resident  Pager: [phone] 04/06/2017

## 2017-04-06 NOTE — Progress Notes (Signed)
Patient ID: Steven Everett, male   DOB: 11-13-1935, 82 y.o.   MRN: [id number] TCTS DAILY ICU PROGRESS NOTE                   [address]            [place] [address]          [phone]   5 Days Post-Op Procedure(s) (LRB): CORONARY ARTERY BYPASS GRAFTING (CABG) x 2 (LIMA to DIAGONAL and SVG to RCA) WITH ENDOSCOPIC HARVESTING OF RIGHT SAPHENOUS VEIN (N/A) TRANSESOPHAGEAL ECHOCARDIOGRAM (TEE) (N/A)  Total Length of Stay:  LOS: 6 days   Subjective: Waked this am  Objective: Vital signs in last 24 hours: Temp:  [97.9 F (36.6 C)-98.3 F (36.8 C)] 98.2 F (36.8 C) (05/07 0416) Pulse Rate:  [60-94] 68 (05/07 0700) Cardiac Rhythm: Normal sinus rhythm (05/06 2000) Resp:  [11-26] 25 (05/07 0700) BP: (82-128)/(37-81) 128/62 (05/07 0700) SpO2:  [72 %-100 %] 99 % (05/07 0700) FiO2 (%):  [40 %] 40 % (05/06 1200) Weight:  [205 lb 14.6 oz (93.4 kg)] 205 lb 14.6 oz (93.4 kg) (05/07 0416)  Filed Weights   04/04/17 0500 04/05/17 0500 04/06/17 0416  Weight: 200 lb 4.8 oz (90.9 kg) 201 lb (91.2 kg) 205 lb 14.6 oz (93.4 kg)    Weight change: 4 lb 14.6 oz (2.227 kg)   Hemodynamic parameters for last 24 hours:    Intake/Output from previous day: 05/06 0701 - 05/07 0700 In: 1888.6 [P.O.:960; I.V.:878.6; IV Piggyback:50] Out: 530 [Urine:530]  Intake/Output this shift: No intake/output data recorded.  Current Meds: Scheduled Meds: . acetaminophen  1,000 mg Oral Q6H   Or  . acetaminophen (TYLENOL) oral liquid 160 mg/5 mL  1,000 mg Per Tube Q6H  . alfuzosin  10 mg Oral Q breakfast  . aspirin EC  325 mg Oral Daily   Or  . aspirin  324 mg Per Tube Daily  . bisacodyl  10 mg Oral Daily   Or  . bisacodyl  10 mg Rectal Daily  . Chlorhexidine Gluconate Cloth  6 each Topical Daily  . docusate sodium  200 mg Oral Daily  . finasteride  5 mg Oral Daily  . insulin aspart  0-24 Units Subcutaneous TID AC & HS  . levothyroxine  112 mcg Oral QAC breakfast  . mouth rinse  15 mL  Mouth Rinse BID  . metoprolol tartrate  12.5 mg Oral BID   Or  . metoprolol tartrate  12.5 mg Per Tube BID  . pantoprazole  40 mg Oral Daily  . pravastatin  40 mg Oral Daily  . sodium chloride flush  10-40 mL Intracatheter Q12H  . sodium chloride flush  3 mL Intravenous Q12H   Continuous Infusions: . sodium chloride Stopped (04/03/17 2100)  . sodium chloride    . sodium chloride Stopped (04/03/17 2100)  . amiodarone 30 mg/hr (04/06/17 0600)  . dexmedetomidine (PRECEDEX) IV infusion Stopped (04/01/17 1700)  . lactated ringers    . lactated ringers Stopped (04/02/17 0500)  . lactated ringers 20 mL/hr at 04/06/17 0600  . milrinone 0.0625 mcg/kg/min (04/06/17 0600)  . nitroGLYCERIN Stopped (04/01/17 1516)  . norepinephrine (LEVOPHED) Adult infusion Stopped (04/04/17 1430)  . phenylephrine (NEO-SYNEPHRINE) Adult infusion Stopped (04/03/17 1336)  . potassium chloride 10 mEq (04/06/17 0649)   PRN Meds:.sodium chloride, ipratropium-albuterol, lactated ringers, metoprolol, midazolam, morphine injection, ondansetron (ZOFRAN) IV, oxyCODONE, potassium chloride, sodium chloride flush, sodium chloride flush, traMADol  General appearance: alert and  cooperative Neurologic: intact Heart: regular rate and rhythm, S1, S2 normal, no murmur, click, rub or gallop Lungs: diminished breath sounds bibasilar Abdomen: soft, non-tender; bowel sounds normal; no masses,  no organomegaly Extremities: extremities normal, atraumatic, no cyanosis or edema and Homans sign is negative, no sign of DVT Wound: sternum stable  Lab Results: CBC: Recent Labs  04/05/17 0400 04/06/17 0528  WBC 7.5 7.0  HGB 9.7* 9.3*  HCT 30.0* 28.9*  PLT 101* 133*   BMET:  Recent Labs  04/05/17 0400 04/06/17 0528  NA 134* 131*  K 4.2 3.7  CL 101 100*  CO2 25 26  GLUCOSE 134* 120*  BUN 30* 42*  CREATININE 1.32* 1.53*  CALCIUM 8.8* 8.9    CMET: Lab Results  Component Value Date   WBC 7.0 04/06/2017   HGB 9.3 (L)  04/06/2017   HCT 28.9 (L) 04/06/2017   PLT 133 (L) 04/06/2017   GLUCOSE 120 (H) 04/06/2017   CHOL 114 10/21/2016   TRIG 58 10/21/2016   HDL 49 10/21/2016   LDLDIRECT 57 04/23/2011   LDLCALC 53 10/21/2016   ALT 32 04/05/2017   AST 37 04/05/2017   NA 131 (L) 04/06/2017   K 3.7 04/06/2017   CL 100 (L) 04/06/2017   CREATININE 1.53 (H) 04/06/2017   BUN 42 (H) 04/06/2017   CO2 26 04/06/2017   TSH 1.56 01/09/2017   PSA 1.42 11/16/2014   INR 1.75 04/01/2017   HGBA1C 5.6 10/21/2016   MICROALBUR 10 05/15/2015      PT/INR: No results for input(s): LABPROT, INR in the last 72 hours. Radiology: Dg Chest Port 1 View  Result Date: 04/05/2017 CLINICAL DATA:  Line placement. EXAM: PORTABLE CHEST 1 VIEW COMPARISON:  04/05/2017 FINDINGS: There is a right arm PICC line with tip in the projection of the cavoatrial junction. Previous median sternotomy and CABG procedure. Aortic atherosclerosis noted. Bilateral pleural effusions are identified, right greater than left. IMPRESSION: 1. Right arm PICC line tip is at the cavoatrial junction. 2. No change in CHF pattern. Electronically Signed   By: Kerby Moors M.D.   On: 04/05/2017 12:20   Dg Chest Port 1 View  Result Date: 04/05/2017 CLINICAL DATA:  Status post CABG postop day 4. EXAM: PORTABLE CHEST 1 VIEW COMPARISON:  04/05/2017 FINDINGS: Cardiomegaly and CABG changes again identified. Left-sided pacemaker is unchanged. Two right IJ central venous catheters are present with tips overlying the upper right atrium superior cavoatrial junction. Bilateral pleural effusions, right greater than left and bibasilar atelectasis noted. There is no evidence of pneumothorax. IMPRESSION: No significant change status post CABG, with support apparatus as described. No evidence of pneumothorax. Electronically Signed   By: Margarette Canada M.D.   On: 04/05/2017 10:04   COX 59  Assessment/Plan: S/P Procedure(s) (LRB): CORONARY ARTERY BYPASS GRAFTING (CABG) x 2 (LIMA to  DIAGONAL and SVG to RCA) WITH ENDOSCOPIC HARVESTING OF RIGHT SAPHENOUS VEIN (N/A) TRANSESOPHAGEAL ECHOCARDIOGRAM (TEE) (N/A) Mobilize Diuresis Diabetes control Plan for transfer to step-down: see transfer orders Monitor cr/bun with duresis plts now up to 130,000 Sinus this am Convert to po Cordarone  Wean off milrinone    Grace Isaac 04/06/2017 7:30 AM

## 2017-04-06 NOTE — Progress Notes (Signed)
CARDIAC REHAB PHASE I  Pt in bed, recently transferred from [facility] Offered to walk with pt, pt declined, states he has already walked twice today and would like to rest. PT to see pt this afternoon as well. Will follow-up tomorrow.   Lenna Sciara, RN, BSN 04/06/2017 12:34 PM

## 2017-04-07 LAB — CBC
HCT: 30.5 % — ABNORMAL LOW (ref 39.0–52.0)
Hemoglobin: 9.7 g/dL — ABNORMAL LOW (ref 13.0–17.0)
MCH: 27.1 pg (ref 26.0–34.0)
MCHC: 31.8 g/dL (ref 30.0–36.0)
MCV: 85.2 fL (ref 78.0–100.0)
Platelets: 187 10*3/uL (ref 150–400)
RBC: 3.58 MIL/uL — ABNORMAL LOW (ref 4.22–5.81)
RDW: 15.5 % (ref 11.5–15.5)
WBC: 8.1 10*3/uL (ref 4.0–10.5)

## 2017-04-07 LAB — BASIC METABOLIC PANEL
Anion gap: 8 (ref 5–15)
BUN: 46 mg/dL — ABNORMAL HIGH (ref 6–20)
CO2: 25 mmol/L (ref 22–32)
Calcium: 9.3 mg/dL (ref 8.9–10.3)
Chloride: 100 mmol/L — ABNORMAL LOW (ref 101–111)
Creatinine, Ser: 1.39 mg/dL — ABNORMAL HIGH (ref 0.61–1.24)
GFR calc Af Amer: 54 mL/min — ABNORMAL LOW (ref >60)
GFR calc non Af Amer: 46 mL/min — ABNORMAL LOW (ref >60)
Glucose, Bld: 121 mg/dL — ABNORMAL HIGH (ref 65–99)
Potassium: 4.3 mmol/L (ref 3.5–5.1)
Sodium: 133 mmol/L — ABNORMAL LOW (ref 135–145)

## 2017-04-07 LAB — GLUCOSE, CAPILLARY
Glucose-Capillary: 100 mg/dL — ABNORMAL HIGH (ref 65–99)
Glucose-Capillary: 128 mg/dL — ABNORMAL HIGH (ref 65–99)
Glucose-Capillary: 119 mg/dL — ABNORMAL HIGH (ref 65–99)
Glucose-Capillary: 127 mg/dL — ABNORMAL HIGH (ref 65–99)

## 2017-04-07 MED ORDER — DIPHENHYDRAMINE HCL 25 MG PO CAPS
25.0000 mg | ORAL_CAPSULE | Freq: Every evening | ORAL | Status: DC | PRN
  Administered 2017-04-07: 25 mg via ORAL
  Filled 2017-04-07: qty 1

## 2017-04-07 NOTE — Progress Notes (Signed)
Occupational Therapy Treatment Patient Details Name: Steven Everett MRN: [id number] DOB: 1935/11/29 Today's Date: 04/07/2017    History of present illness Pt is an 82 y/o male s/p CABG secondary to NSTEMI. PMH including but not limited to CAD, DM, R lung cancer, liver cirrhosis, HTN, PAF, and cardiac defibrillator placement in 2014.   OT comments  Pt with fatigue during LB bathing and dressing requiring rest breaks and cues to cross foot over opposite knee to conserve energy and minimize L flank pain. Continues to be unable to stand without out assistance from chair and 3 in 1 over toilet despite use of momentum. Son has returned to [facility]. SNF continues to be recommended for ST rehab.  Follow Up Recommendations  SNF;Supervision/Assistance - 24 hour    Equipment Recommendations  3 in 1 bedside commode    Recommendations for Other Services      Precautions / Restrictions Precautions Precautions: Fall;Sternal Precaution Comments: pt able to recall 3/5 precautions end of session       Mobility Bed Mobility               General bed mobility comments: in chair on arrival  Transfers Overall transfer level: Needs assistance Equipment used: Rolling walker (2 wheeled) Transfers: Sit to/from Stand Sit to Stand: Min assist         General transfer comment: cues for hand placement, assist for anterior translation using momentum and rise    Balance Overall balance assessment: Needs assistance   Sitting balance-Leahy Scale: Good     Standing balance support: During functional activity;Bilateral upper extremity supported Standing balance-Leahy Scale: Poor                             ADL either performed or assessed with clinical judgement   ADL Overall ADL's : Needs assistance/impaired     Grooming: Oral care;Standing;Min guard       Lower Body Bathing: Minimal assistance;Sitting/lateral leans Lower Body Bathing Details (indicate cue type and reason): washed  feet in basin  Upper Body Dressing : Minimal assistance;Standing Upper Body Dressing Details (indicate cue type and reason): pt requires one hand on walker for balance while donning front opening gown Lower Body Dressing: Set up;Sitting/lateral leans Lower Body Dressing Details (indicate cue type and reason): socks only, instructed to cross foot over opposite knee to conserve energy Toilet Transfer: Ambulation;RW;BSC;Minimal assistance Toilet Transfer Details (indicate cue type and reason): educated in use of BSC over toilet Toileting- Clothing Manipulation and Hygiene: Minimal assistance;Sit to/from stand         General ADL Comments: reinforced sternal precautions related to IADL     Vision       Perception     Praxis      Cognition Arousal/Alertness: Awake/alert Behavior During Therapy: WFL for tasks assessed/performed Overall Cognitive Status: Within Functional Limits for tasks assessed                                          Exercises     Shoulder Instructions       General Comments      Pertinent Vitals/ Pain       Pain Assessment: Faces Faces Pain Scale: Hurts little more Pain Location: L flank Pain Descriptors / Indicators: Grimacing;Guarding Pain Intervention(s): Monitored during session  Home Living  Prior Functioning/Environment              Frequency  Min 2X/week        Progress Toward Goals  OT Goals(current goals can now be found in the care plan section)  Progress towards OT goals: Progressing toward goals  Acute Rehab OT Goals Patient Stated Goal: return home OT Goal Formulation: With patient Time For Goal Achievement: 04/03/2017 Potential to Achieve Goals: Good  Plan Discharge plan remains appropriate    Co-evaluation                 AM-PAC PT "6 Clicks" Daily Activity     Outcome Measure   Help from another person eating meals?: None Help  from another person taking care of personal grooming?: A Little Help from another person toileting, which includes using toliet, bedpan, or urinal?: A Little Help from another person bathing (including washing, rinsing, drying)?: A Little Help from another person to put on and taking off regular upper body clothing?: None Help from another person to put on and taking off regular lower body clothing?: A Little 6 Click Score: 20    End of Session Equipment Utilized During Treatment: Gait belt;Rolling walker  OT Visit Diagnosis: Unsteadiness on feet (R26.81)   Activity Tolerance Patient tolerated treatment well   Patient Left Other (comment) (walking with cardiac rehab)   Nurse Communication          Time: 0881-1031 OT Time Calculation (min): 22 min  Charges: OT General Charges $OT Visit: 1 Procedure OT Treatments $Self Care/Home Management : 8-22 mins     Malka So 04/07/2017, 12:23 PM  [phone]

## 2017-04-07 NOTE — Discharge Instructions (Signed)
Coronary Artery Bypass Grafting, Care After  This sheet gives you information about how to care for yourself after your procedure. Your health care provider may also give you more specific instructions. If you have problems or questions, contact your health care provider. What can I expect after the procedure? After the procedure, it is common to have:  Nausea and a lack of appetite.  Constipation.  Weakness and fatigue.  Depression or irritability.  Pain or discomfort in your incision areas. Follow these instructions at home: Medicines   Take over-the-counter and prescription medicines only as told by your health care provider. Do not stop taking medicines or start any new medicines without approval from your health care provider.  If you were prescribed an antibiotic medicine, take it as told by your health care provider. Do not stop taking the antibiotic even if you start to feel better.  Do not drive or use heavy machinery while taking prescription pain medicine. Incision care   Follow instructions from your health care provider about how to take care of your incisions. Make sure you:  Wash your hands with soap and water before you change your bandage (dressing). If soap and water are not available, use hand sanitizer.  Change your dressing as told by your health care provider.  Leave stitches (sutures), skin glue, or adhesive strips in place. These skin closures may need to stay in place for 2 weeks or longer. If adhesive strip edges start to loosen and curl up, you may trim the loose edges. Do not remove adhesive strips completely unless your health care provider tells you to do that.  Keep incision areas clean, dry, and protected.  Check your incision areas every day for signs of infection. Check for:  More redness, swelling, or pain.  More fluid or blood.  Warmth.  Pus or a bad smell.  If incisions were made in your legs:  Avoid crossing your legs.  Avoid  sitting for long periods of time. Change positions every 30 minutes.  Raise (elevate) your legs when you are sitting. Bathing   Do not take baths, swim, or use a hot tub until your health care provider approves.  Only take sponge baths. Pat the incisions dry. Do not rub incisions with a washcloth or towel.  Ask your health care provider when you can shower. Eating and drinking   Eat foods that are high in fiber, such as raw fruits and vegetables, whole grains, beans, and nuts. Meats should be lean cut. Avoid canned, processed, and fried foods. This can help prevent constipation and is a recommended part of a heart-healthy diet.  Drink enough fluid to keep your urine clear or pale yellow.  Limit alcohol intake to no more than 1 drink a day for nonpregnant women and 2 drinks a day for men. One drink equals 12 oz of beer, 5 oz of wine, or 1 oz of hard liquor. Activity   Rest and limit your activity as told by your health care provider. You may be instructed to:  Stop any activity right away if you have chest pain, shortness of breath, irregular heartbeats, or dizziness. Get help right away if you have any of these symptoms.  Move around frequently for short periods or take short walks as directed by your health care provider. Gradually increase your activities. You may need physical therapy or cardiac rehabilitation to help strengthen your muscles and build your endurance.  Avoid lifting, pushing, or pulling anything that is heavier than 10  lb (4.5 kg) for at least 6 weeks or as told by your health care provider.  Do not drive until your health care provider approves.  Ask your health care provider when you may return to work.  Ask your health care provider when you may resume sexual activity. General instructions   Do not use any products that contain nicotine or tobacco, such as cigarettes and e-cigarettes. If you need help quitting, ask your health care provider.  Take 2-3 deep  breaths every few hours during the day, while you recover. This helps expand your lungs and prevent complications like pneumonia after surgery.  If you were given a device called an incentive spirometer, use it several times a day to practice deep breathing. Support your chest with a pillow or your arms when you take deep breaths or cough.  Wear compression stockings as told by your health care provider. These stockings help to prevent blood clots and reduce swelling in your legs.  Weigh yourself every day. This helps identify if your body is holding (retaining) fluid that may make your heart and lungs work harder.  Keep all follow-up visits as told by your health care provider. This is important. Contact a health care provider if:  You have more redness, swelling, or pain around any incision.  You have more fluid or blood coming from any incision.  Any incision feels warm to the touch.  You have pus or a bad smell coming from any incision  You have a fever.  You have swelling in your ankles or legs.  You have pain in your legs.  You gain 2 lb (0.9 kg) or more a day.  You are nauseous or you vomit.  You have diarrhea. Get help right away if:  You have chest pain that spreads to your jaw or arms.  You are short of breath.  You have a fast or irregular heartbeat.  You notice a "clicking" in your breastbone (sternum) when you move.  You have numbness or weakness in your arms or legs.  You feel dizzy or light-headed. Summary  After the procedure, it is common to have pain or discomfort in the incision areas.  Do not take baths, swim, or use a hot tub until your health care provider approves.  Gradually increase your activities. You may need physical therapy or cardiac rehabilitation to help strengthen your muscles and build your endurance.  Weigh yourself every day. This helps identify if your body is holding (retaining) fluid that may make your heart and lungs work  harder. This information is not intended to replace advice given to you by your health care provider. Make sure you discuss any questions you have with your health care provider. Document Released: 06/06/2005 Document Revised: 10/06/2016 Document Reviewed: 10/06/2016 Elsevier Interactive Patient Education  2017 [employer].

## 2017-04-07 NOTE — NC FL2 (Signed)
[place] LEVEL OF CARE SCREENING TOOL     IDENTIFICATION  Patient Name: Steven Everett Birthdate: 1935/10/02 Sex: male Admission Date (Current Location): 03/30/2017  [facility] and [place] Number:  Herbalist and Address:  [facility], [place] [address] [place], [place] [address]      Provider Number: [id number]  Attending Physician Name and Address:  Grace Isaac, MD  Relative Name and Phone Number:       Current Level of Care: Hospital Recommended Level of Care: [place] Prior Approval Number:    Date Approved/Denied:   PASRR Number:   [id number]   Discharge Plan: SNF    Current Diagnoses: Patient Active Problem List   Diagnosis Date Noted  . Coronary artery disease 04/01/2017  . NSTEMI (non-ST elevated myocardial infarction) ([place]) 03/30/2017  . Persistent atrial fibrillation ([place]) 01/31/2016  . IFG (impaired fasting glucose) 05/15/2015  . CKD (chronic kidney disease) 05/15/2015  . ICD (implantable cardioverter-defibrillator) in place 01/10/2015  . Atrial fibrillation ([place]) 12/14/2013  . Sick sinus syndrome ([place]) 12/14/2013  . Chronic systolic heart failure ([place]) 08/12/2013  . Symptomatic bradycardia 08/12/2013  . Hyperlipidemia 07/20/2013  . CAD (coronary artery disease) 04/06/2013  . Congestive dilated cardiomyopathy ([place]) 01/26/2013  . Aortic stenosis 01/26/2013  . SHOULDER PAIN, RIGHT 02/03/2011  . Benign prostatic hypertrophy with urinary obstruction 07/20/2008  . ADENOMATOUS COLONIC POLYP 01/24/2008  . HEMORRHOIDS, INTERNAL 01/11/2008  . DIVERTICULOSIS, COLON 01/11/2008  . Hepatic cirrhosis ([place]) 11/16/2007  . History of lung cancer 10/06/2007    Class: History of  . SEBORRHEIC DERMATITIS 10/04/2007  . BARRETTS ESOPHAGUS 01/01/2007  . Hypothyroidism 12/22/2006  . GERD 12/22/2006    Orientation RESPIRATION BLADDER Height & Weight     Self, Time, Situation, Place  RA, normal  Continent Weight: 202 lb 6.4 oz (91.8 kg) Height:  '6\' 1"'$  (185.4 cm)  BEHAVIORAL SYMPTOMS/MOOD NEUROLOGICAL BOWEL NUTRITION STATUS      Continent Diet (see dc summary)  AMBULATORY STATUS COMMUNICATION OF NEEDS Skin   Extensive Assist Verbally Surgical wounds, Skin abrasions, Bruising                       Personal Care Assistance Level of Assistance  Bathing, Feeding, Dressing Bathing Assistance: Limited assistance Feeding assistance: Independent Dressing Assistance: Limited assistance     Functional Limitations Info  Sight, Hearing, Speech Sight Info: Adequate Hearing Info: Adequate Speech Info: Adequate    SPECIAL CARE FACTORS FREQUENCY  PT (By licensed PT), OT (By licensed OT)     PT Frequency: 5x OT Frequency: 5x            Contractures Contractures Info: Not present    Additional Factors Info  Code Status, Allergies Code Status Info: Full Code Allergies Info: Atorvastatin           Current Medications (04/07/2017):  This is the current hospital active medication list Current Facility-Administered Medications  Medication Dose Route Frequency Provider Last Rate Last Dose  . 0.9 %  sodium chloride infusion  250 mL Intravenous PRN Grace Isaac, MD      . alfuzosin (UROXATRAL) 24 hr tablet 10 mg  10 mg Oral Q breakfast Lars Pinks M, PA-C   10 mg at 04/05/17 6962  . amiodarone (PACERONE) tablet 200 mg  200 mg Oral Q12H Grace Isaac, MD   200 mg at 04/07/17 0920   Followed by  . [START ON 04/13/2017] amiodarone (  PACERONE) tablet 200 mg  200 mg Oral Daily Grace Isaac, MD      . aspirin EC tablet 81 mg  81 mg Oral Daily Grace Isaac, MD   81 mg at 04/07/17 0919  . bisacodyl (DULCOLAX) EC tablet 10 mg  10 mg Oral Daily PRN Grace Isaac, MD       Or  . bisacodyl (DULCOLAX) suppository 10 mg  10 mg Rectal Daily PRN Grace Isaac, MD      . Chlorhexidine Gluconate Cloth 2 % PADS 6 each  6 each Topical Daily Grace Isaac, MD   6 each at 04/06/17 1000  . diphenhydrAMINE (BENADRYL) capsule 25 mg  25 mg Oral QHS PRN Harriet Pho, Tessa N, PA-C      . docusate sodium (COLACE) capsule 200 mg  200 mg Oral Daily Grace Isaac, MD   200 mg at 04/06/17 1008  . enoxaparin (LOVENOX) injection 30 mg  30 mg Subcutaneous Q24H Grace Isaac, MD   30 mg at 04/07/17 0920  . finasteride (PROSCAR) tablet 5 mg  5 mg Oral Daily Lars Pinks M, PA-C   5 mg at 04/07/17 7619  . furosemide (LASIX) tablet 20 mg  20 mg Oral Daily Grace Isaac, MD   20 mg at 04/07/17 0919  . guaiFENesin (MUCINEX) 12 hr tablet 600 mg  600 mg Oral Q12H PRN Grace Isaac, MD      . insulin aspart (novoLOG) injection 0-24 Units  0-24 Units Subcutaneous TID AC & HS Grace Isaac, MD   2 Units at 04/06/17 [phone]  . ipratropium-albuterol (DUONEB) 0.5-2.5 (3) MG/3ML nebulizer solution 3 mL  3 mL Nebulization Q4H PRN Arnoldo Lenis, MD   3 mL at 04/04/17 1704  . levothyroxine (SYNTHROID, LEVOTHROID) tablet 112 mcg  112 mcg Oral QAC breakfast Chakravartti, Jaidip, MD   112 mcg at 04/07/17 0612  . MEDLINE mouth rinse  15 mL Mouth Rinse BID Grace Isaac, MD   15 mL at 04/05/17 1000  . metoprolol tartrate (LOPRESSOR) tablet 12.5 mg  12.5 mg Oral BID Grace Isaac, MD   12.5 mg at 04/07/17 2671  . moving right along book   Does not apply Once Grace Isaac, MD      . ondansetron [facility]) tablet 4 mg  4 mg Oral Q6H PRN Grace Isaac, MD       Or  . ondansetron [facility]) injection 4 mg  4 mg Intravenous Q6H PRN Grace Isaac, MD      . pantoprazole (PROTONIX) EC tablet 40 mg  40 mg Oral QAC breakfast Grace Isaac, MD   40 mg at 04/07/17 0920  . potassium chloride (K-DUR,KLOR-CON) CR tablet 10 mEq  10 mEq Oral Daily Grace Isaac, MD   10 mEq at 04/07/17 0919  . pravastatin (PRAVACHOL) tablet 40 mg  40 mg Oral Daily Chakravartti, Jaidip, MD   40 mg at 04/07/17 0920  . sodium chloride flush (NS) 0.9 % injection  10-40 mL  10-40 mL Intracatheter Q12H Grace Isaac, MD   20 mL at 04/06/17 1000  . sodium chloride flush (NS) 0.9 % injection 10-40 mL  10-40 mL Intracatheter PRN Grace Isaac, MD   10 mL at 04/07/17 0441  . sodium chloride flush (NS) 0.9 % injection 3 mL  3 mL Intravenous Q12H Grace Isaac, MD   3 mL at 04/06/17 2146  . sodium chloride flush (NS)  0.9 % injection 3 mL  3 mL Intravenous PRN Grace Isaac, MD      . traMADol [facility]at[facility]bon) tablet 50 mg  50 mg Oral Q4H PRN Lars Pinks M, PA-C   50 mg at 04/05/17 0127     Discharge Medications: Please see discharge summary for a list of discharge medications.  Relevant Imaging Results:  Relevant Lab Results:   Additional Information SSN: [id number]   Has a PICC Line  [place], Evie Lacks, [place]

## 2017-04-07 NOTE — Discharge Summary (Signed)
Physician Discharge Summary  Patient ID: Steven Everett MRN: [id number] DOB/AGE: October 22, 1935 82 y.o.  Admit date: 03/30/2017 Discharge date: 04/09/2017  Admission Diagnoses: Patient Active Problem List   Diagnosis Date Noted  . Coronary artery disease 04/01/2017  . NSTEMI (non-ST elevated myocardial infarction) ([facility]) 03/30/2017    Discharge Diagnoses:  Principal Problem:   NSTEMI (non-ST elevated myocardial infarction) [facility]) Active Problems:   Aortic stenosis   CAD (coronary artery disease)   Chronic systolic heart failure (HCC)   Atrial fibrillation (HCC)   Coronary artery disease   Discharged Condition: good  HPI:  Mr. Steven Everett is an 11 white male with extensive medical history.  He was diagnosed with Lung cancer in 1998.  This was treated with Right middle lobe wedge resection, right lower lobectomy, and 30 treatments of chemotherapy and radiation. He has been followed with yearly CXR since that time that have been WNL.  His coronary history looks to date back to 2014 at which time he underwent ICD placement for LV dysfunction.  He has a history of PAF and is S/P Watchman placement from 01/2016.  There is also documented history of mild Aortic Stenosis with calcified annulus.  There was no aortic insufficiency per TTE from 01/2017.  He also has a history of HTN, Hyperlipidemia, GERD/Barrett's esophagus, DM controlled with diet, and Hypothyroidism.  The patient remains physically active.  He presented to the ED on 03/30/2017 with a complaint of chest pain.  He developed chest pain a week prior while working out on the eliptical.  This resolved with rest, but as the week progressed this started to occur at rest.  He described the pain as pressure with radiation to his left arm.  He denies N/V, diaphoresis, and dyspnea.  Workup in the ED showed the patient suffered a NSTEMI.  He was placed on Heparin and admitted for further care.  Currently the patient is chest pain free.  He is a former smoker of  2 ppd for 36 years., quit in 1990.   Hospital Course:  Mr. Kemppainen underwent a coronary artery bypass grafting 2 by Dr. Servando Snare on 04/01/2017. He tolerated the procedure well and was transferred to the ICU. He was extubated in a timely manner. Postop day 1 we discontinued his chest tubes and lines. We began to mobilize the patient. We initiated diuretic regimen for fluid overload. We held Lovenox due to mild pancytopenia. We used PAS hose for DVT prophylaxis. He remained on dopamine and milrinone at this time. Postop day 2 he continued to progress. Cardiology was on board to help manage atrial fibrillation. We continued to wean down his milrinone and Neo-synephrine. On postop day 3 we discontinued IV amiodarone and started the patient on by mouth. His code walks remained greater than 70. We were weaning his pressors as tolerated. On postoperative day 4 we placed a PICC line and removed the right internal jugular central line. We continued his diuretic regimen for fluid overload. He was transferred to the telemetry floor for continued care. Platelets continued to trend up. On the floor, we were able to discontinue his external pacer. He was weaned off supplemental oxygen. His blood glucose level was well controlled and his appetite was increasing. His pacing wires were removed 04/08/2017 without issue. He continued to ambulate with limited assistance, his incisions are healing well, he is tolerating room air and he is ready for discharge to a nursing facility.   Consults: Cardiology  Significant Diagnostic Studies:   CLINICAL DATA:  Status post chest tube removal.  Shortness of breath  EXAM: PORTABLE CHEST 1 VIEW  COMPARISON:  Apr 05, 2017  FINDINGS: Central catheter tip is essentially at the cavoatrial junction. Pacemaker leads are attached to the right atrium and right ventricle. Temporary pacemaker wires are attached to the right heart. There is a stable small right apical pneumothorax  without tension component. There are pleural effusions bilaterally with atelectatic change in lung bases, unchanged. Heart is upper normal in size with pulmonary vascularity within normal limits. There is aortic atherosclerosis. There is degenerative change in each shoulder.  IMPRESSION: Stable central catheter position. Persistent pleural effusions bilaterally with bibasilar atelectasis. Stable cardiac silhouette. Small right apical pneumothorax, not felt to be appreciably changed, without tension component. There is aortic atherosclerosis.   Electronically Signed   By: Lowella Grip III M.D.   On: 04/06/2017 07:50    Treatments:  NAME:  Steven Everett                 ACCOUNT NO.:  [id number]  MEDICAL RECORD NO.:  [id number]  LOCATION:                                 FACILITY:  PHYSICIAN:  Lanelle Bal, MD    DATE OF BIRTH:  [birth year]  DATE OF PROCEDURE:  04/01/2017 DATE OF DISCHARGE:                              OPERATIVE REPORT   PREOPERATIVE DIAGNOSIS:  Unstable angina with high-grade right coronary obstruction and left anterior descending diagonal obstruction; mild aortic stenosis, status post mediastinal radiation.  POSTOPERATIVE DIAGNOSIS:  Unstable angina with high-grade right coronary obstruction and left anterior descending diagonal obstruction; mild aortic stenosis, status post mediastinal radiation.  PROCEDURE PERFORMED:  Coronary artery bypass grafting x2 with the left internal mammary to the diagonal coronary artery and reverse saphenous vein graft to the posterior descending coronary artery with right thigh greater saphenous endovein harvesting.  SURGEON:  Lanelle Bal, MD.  FIRST ASSISTANT:  Lars Pinks, [place].   Discharge Exam: Blood pressure 133/69, pulse 78, temperature 97.7 F (36.5 C), temperature source Oral, resp. rate 18, height '6\' 1"'$  (1.854 m), weight 90.7 kg (200 lb), SpO2 99 %.   General appearance: alert,  cooperative and no distress Heart: regular rate and rhythm, S1, S2 normal, no murmur, click, rub or gallop Lungs: clear to auscultation bilaterally Abdomen: soft, non-tender; bowel sounds normal; no masses,  no organomegaly Extremities: 1+ nonpitting pedal edema Wound: clean and dry without drainage  Disposition: 01-Home or Self Care  Discharge Instructions    Amb Referral to Cardiac Rehabilitation    Complete by:  As directed    Diagnosis:  CABG   CABG X ___:  2   Discharge patient    Complete by:  As directed    Discharge disposition:  03-Skilled Nursing Facility   Discharge patient date:  04/09/2017     Allergies as of 04/09/2017      Reactions   Atorvastatin Other (See Comments)   joint aches      Medication List    STOP taking these medications   carvedilol 6.25 MG tablet Commonly known as:  COREG   lisinopril 5 MG tablet Commonly known as:  PRINIVIL,ZESTRIL     TAKE these medications   alfuzosin 10 MG 24 hr tablet Commonly known as:  UROXATRAL Take 10 mg by mouth daily with breakfast.   amiodarone 200 MG tablet Commonly known as:  PACERONE Take 1 tablet (200 mg total) by mouth daily. Start taking on:  04/13/2017   aspirin 81 MG tablet TAKE 1 TABLET  BY MOUTH IN THE MORNING AND 1 TABLET BY MOUTH AT NIGHT.   diphenhydrAMINE 25 mg capsule Commonly known as:  BENADRYL Take 1 capsule (25 mg total) by mouth at bedtime as needed for sleep.   docusate sodium 100 MG capsule Commonly known as:  COLACE Take 2 capsules (200 mg total) by mouth daily. Start taking on:  04/10/2017   finasteride 5 MG tablet Commonly known as:  PROSCAR Take 5 mg by mouth daily.   furosemide 20 MG tablet Commonly known as:  LASIX Take 1 tablet (20 mg total) by mouth daily.   ipratropium-albuterol 0.5-2.5 (3) MG/3ML Soln Commonly known as:  DUONEB Take 3 mLs by nebulization every 6 (six) hours.   levothyroxine 112 MCG tablet Commonly known as:  SYNTHROID, LEVOTHROID Take 1  tablet (112 mcg total) by mouth daily before breakfast.   metoprolol tartrate 25 MG tablet Commonly known as:  LOPRESSOR Take 0.5 tablets (12.5 mg total) by mouth 2 (two) times daily.   pantoprazole 40 MG tablet Commonly known as:  PROTONIX Take 1 tablet (40 mg total) by mouth daily before breakfast. Start taking on:  04/10/2017   potassium chloride 10 MEQ tablet Commonly known as:  K-DUR Take 1 tablet (10 mEq total) by mouth daily.   pravastatin 40 MG tablet Commonly known as:  PRAVACHOL Take 1 tablet (40 mg total) by mouth daily.   traMADol 50 MG tablet Commonly known as:  ULTRAM Take 1 tablet (50 mg total) by mouth every 6 (six) hours as needed for moderate pain.       Contact information for follow-up providers    Grace Isaac, MD Follow up.   Specialty:  Cardiothoracic Surgery Why:  Your appointment is on 05/11/2017 at 1:00 PM. Please arrive at 12:30 PM for a chest x-ray at [facility]. They are located on the first floor of our building. Contact information: [address] [place] [address] [phone]        Hali Marry, MD. Call in 1 day(s).   Specialty:  Family Medicine Contact information: [address] [place] [address] [phone]        Almyra Deforest, [place] Follow up.   Specialties:  Cardiology, Radiology Why:  Almyra Deforest, PA-C 5/23 '@10am'$  ([facility]). Please bring your medication list.  Contact information: [address] [place] [address] [phone]            Contact information for after-discharge care    [facility] Follow up.   Specialty:  [place] information: [address] [place] [phone]                 The patient has been discharged on:   1.Beta Blocker:  Yes [ x  ]                              No   [   ]  If No,  reason:  2.Ace Inhibitor/ARB: Yes [   ]                                     No  [  x  ]                                     If No, reason: AKI  3.Statin:   Yes [ x  ]                  No  [   ]                  If No, reason:  4.Ecasa:  Yes  [ x  ]                  No   [   ]                  If No, reason:    Signed: Elgie Collard 04/09/2017, 2:03 PM

## 2017-04-07 NOTE — Clinical Social Work Placement (Signed)
   CLINICAL SOCIAL WORK PLACEMENT  NOTE  Date:  04/07/2017  Patient Details  Name: Steven Everett MRN: [id number] Date of Birth: 26-Nov-1935  Clinical Social Work is seeking post-discharge placement for this patient at the [place] level of care (*CSW will initial, date and re-position this form in  chart as items are completed):  Yes   Patient/family provided with [facility] Work Department's list of facilities offering this level of care within the geographic area requested by the patient (or if unable, by the patient's family).  Yes   Patient/family informed of their freedom to choose among providers that offer the needed level of care, that participate in Medicare, Medicaid or managed care program needed by the patient, have an available bed and are willing to accept the patient.  Yes   Patient/family informed of [facility]'s ownership interest in [facility] and [facility], as well as of the fact that they are under no obligation to receive care at these facilities.  PASRR submitted to EDS on 04/07/17     PASRR number received on 04/07/17     Existing PASRR number confirmed on       FL2 transmitted to all facilities in geographic area requested by pt/family on 04/07/17     FL2 transmitted to all facilities within larger geographic area on       Patient informed that his/her managed care company has contracts with or will negotiate with certain facilities, including the following:            Patient/family informed of bed offers received.  Patient chooses bed at       Physician recommends and patient chooses bed at      Patient to be transferred to   on  .  Patient to be transferred to facility by       Patient family notified on   of transfer.  Name of family member notified:        PHYSICIAN Please sign FL2     Additional Comment:    _______________________________________________ Lilly Cove, LCSW 04/07/2017, 11:19  AM

## 2017-04-07 NOTE — Progress Notes (Addendum)
      [address]       [place] [address]             [phone]      6 Days Post-Op Procedure(s) (LRB): CORONARY ARTERY BYPASS GRAFTING (CABG) x 2 (LIMA to DIAGONAL and SVG to RCA) WITH ENDOSCOPIC HARVESTING OF RIGHT SAPHENOUS VEIN (N/A) TRANSESOPHAGEAL ECHOCARDIOGRAM (TEE) (N/A) Subjective: Shares he did not sleep well last night. Otherwise doing well with appetite coming back.   Objective: Vital signs in last 24 hours: Temp:  [97.8 F (36.6 C)-98.2 F (36.8 C)] 98.1 F (36.7 C) (05/08 0328) Pulse Rate:  [69-102] 71 (05/08 0328) Cardiac Rhythm: Other (Comment) (05/08 0650) Resp:  [18-23] 20 (05/08 0328) BP: (109-139)/(50-67) 121/50 (05/08 0328) SpO2:  [75 %-100 %] 97 % (05/08 0328) Weight:  [91.8 kg (202 lb 6.4 oz)] 91.8 kg (202 lb 6.4 oz) (05/08 0325)     Intake/Output from previous day: 05/07 0701 - 05/08 0700 In: 251.5 [P.O.:240; I.V.:11.5] Out: 1250 [Urine:1250] Intake/Output this shift: No intake/output data recorded.  General appearance: alert, cooperative and no distress Heart: intermittently paced, rate in the 80s Lungs: clear to auscultation bilaterally Abdomen: soft, non-tender; bowel sounds normal; no masses,  no organomegaly Extremities: 1+ non pitting pedal edema Wound: clean and dry  Lab Results:  Recent Labs  04/06/17 0528 04/07/17 0437  WBC 7.0 8.1  HGB 9.3* 9.7*  HCT 28.9* 30.5*  PLT 133* 187   BMET:  Recent Labs  04/06/17 0528 04/07/17 0437  NA 131* 133*  K 3.7 4.3  CL 100* 100*  CO2 26 25  GLUCOSE 120* 121*  BUN 42* 46*  CREATININE 1.53* 1.39*  CALCIUM 8.9 9.3    PT/INR: No results for input(s): LABPROT, INR in the last 72 hours. ABG    Component Value Date/Time   PHART 7.390 04/03/2017 0231   HCO3 23.6 04/03/2017 0231   TCO2 25 04/03/2017 0231   ACIDBASEDEF 1.0 04/03/2017 0231   O2SAT 59.2 04/06/2017 0500   CBG (last 3)   Recent Labs  04/06/17 1641 04/06/17 2046 04/07/17 0552  GLUCAP 103*  100* 100*    Assessment/Plan: S/P Procedure(s) (LRB): CORONARY ARTERY BYPASS GRAFTING (CABG) x 2 (LIMA to DIAGONAL and SVG to RCA) WITH ENDOSCOPIC HARVESTING OF RIGHT SAPHENOUS VEIN (N/A) TRANSESOPHAGEAL ECHOCARDIOGRAM (TEE) (N/A)   CV-HR BBB underlying rate in the 80s-90s. Pacer undersensing.  BP well controlled. Continue Amio PO, Lopressor, and pravastatin Pulm-tolerating room air with good oxygen saturation Renal-creatinine trending down. Electrolytes okay. Weight slowly trending down.  H and H-stable, expected blood loss anemia.  Endo-blood glucose level well controlled [place]-Benadryl prn  Plan: Continues to progress. Encourage incentive spirometry, ambulate TID. Continue diuresis for fluid overload. Will turn off pacer.       LOS: 7 days    Elgie Collard 04/07/2017  Poss snf wed or Thursday I have seen and examined Steven Everett and agree with the above assessment  and plan.  Grace Isaac MD [place] [phone] Office [phone] 04/07/2017 3:05 PM

## 2017-04-07 NOTE — Consult Note (Signed)
[facility] CM Primary Care Navigator  04/07/2017  Steven Everett 04-19-1935 [id number]   Met with patient in the roomto identify possible discharge needs. Patient reports having chest pain primarily to the left side that had led to this admission/ surgery. Patient endorses Dr. Beatrice Lecher with [facility] in [place] his primary care provider.   Patient shared using [facility] in [place] to obtain medications without any problem.   Patient states that hemanages his own medications at home using "pill box" system weekly.   Patient was driving prior to admission. He states that friend Toney Rakes) or son Nicki Reaper) can providetransportation to his doctors' appointments after discharge and until he is cleared by the doctor to drive.   He lives alone and reports that after discharge from Rehabilitation facility, his neighbors Izora Gala, Jackelyn Poling and Fraser Din) will be assisting with his care needs at homeas needed.  Anticipated discharge plan is skilled nursing facility per therapy recommendations (prefers [facility] in [place]).  Patient voiced understanding to call primary care provider's office when he returns back home, for a post discharge follow-up appointment within a week or sooner if needed.Patient letter (with PCP's contact number) was provided as his reminder.  He denies any health management needs or concerns at this time. [facility] care management contact information provided for future needs that he may have.    For questions, please contact:  Dannielle Huh, BSN, RN- [facility] Primary Care Navigator  Telephone: [phone] [place]

## 2017-04-07 NOTE — Progress Notes (Signed)
CARDIAC REHAB PHASE I   PRE:  Rate/Rhythm: 75-87 irreg, BBB some paced beats    Working with OT  MODE:  Ambulation: 350 ft   POST:  Rate/Rhythm: 98  BP:  Supine:   Sitting: 127/93  Standing:    SaO2: 97-98%RA 1105-1140 Pt just finished working with OT but ready to walk. Pt walked 350 ft on RA with rolling walker and asst x 1. Gait steady. Tolerated well but did state he felt a little SOB. To recliner after walk with call bell. Very motivated.   [name], RN BSN  04/07/2017 11:35 AM

## 2017-04-07 NOTE — Care Management Important Message (Signed)
Important Message  Patient Details  Name: Steven Everett MRN: [id number] Date of Birth: 30-Jan-1935   Medicare Important Message Given:  Yes    Macie Baum [place] 04/07/2017, 1:00 PM

## 2017-04-07 NOTE — Clinical Social Work Note (Signed)
Clinical Social Work Assessment  Patient Details  Name: Steven Everett MRN: [id number] Date of Birth: 1935/03/03  Date of referral:  04/07/17               Reason for consult:  Facility Placement, [employer], Discharge Planning                Permission sought to share information with:  Tourist information centre manager, Customer service manager, Family Supports Permission granted to share information::  Yes, Verbal Permission Granted  Name::        Agency::     Relationship::  Son (not at bedside, lives in [place], but coming to assist patient with placement)  Contact Information:     Housing/Transportation Living arrangements for the past 2 months:  Single Family Home Source of Information:  Patient, Medical Team, Case Manager, Adult Children Patient Interpreter Needed:  None Criminal Activity/Legal Involvement Pertinent to Current Situation/Hospitalization:  No - Comment as needed Significant Relationships:  Adult Children, Other Family Members Lives with:  Self Do you feel safe going back to the place where you live?  No Need for family participation in patient care:  Yes (Comment)  Care giving concerns:  Patient reports he lives at home alone, but has good family support, but son lives in [place].  Son coming to be with patient after his doctor's appointment today and he will assist with transition to SNF and discharge.  Patient agreeable to SNF placement at discharge for continued supervision and safety.  Not clear of his choice, but open to bed offers.   Social Worker assessment / plan:    Assessment and SNF work up completed. Patient very pleasant and understanding of plans and role of SW in hospital.  WIll follow up with bed offers. Son coming into town today.  Employment status:  Retired Forensic scientist:  [employer] PT Recommendations:  [place], [place] / Referral to community resources:  [place]  Patient/Family's  Response to care:    Agreeable to plan  Patient/Family's Understanding of and Emotional Response to Diagnosis, Current Treatment, and Prognosis:  Patient very understanding and appreciative of work. Reports he understands his limitations and ready to move to next venue to progress home.   Emotional Assessment Appearance:  Appears stated age Attitude/Demeanor/Rapport:    Affect (typically observed):  Accepting, Adaptable Orientation:  Oriented to Self, Oriented to Place, Oriented to  Time, Oriented to Situation Alcohol / Substance use:  Not Applicable Psych involvement (Current and /or in the community):  No (Comment)  Discharge Needs  Concerns to be addressed:  No discharge needs identified Readmission within the last 30 days:  No Current discharge risk:  None Barriers to Discharge:  No Barriers Identified   Lilly Cove, LCSW 04/07/2017, 11:45 AM

## 2017-04-07 NOTE — Progress Notes (Signed)
Physical Therapy Treatment Patient Details Name: Steven Everett MRN: [id number] DOB: 1935/02/17 Today's Date: 04/07/2017    History of Present Illness Pt is an 82 y/o male s/p CABG secondary to NSTEMI. PMH including but not limited to CAD, DM, R lung cancer, liver cirrhosis, HTN, PAF, and cardiac defibrillator placement in 2014.    PT Comments    Pt pleasant and able to state 2/5 precautions on arrival, educated for all and recalled 3/5 end of session. Pt educated for transfers, HEP and functional mobility. Will continue to follow and progress.   HR 85-115 Pre BP 132/67, post 123/62   Follow Up Recommendations  Supervision/Assistance - 24 hour;SNF     Equipment Recommendations  Rolling walker with 5" wheels    Recommendations for Other Services       Precautions / Restrictions Precautions Precautions: Fall;Sternal Precaution Comments: pt able to recall 3/5 precautions end of session    Mobility  Bed Mobility               General bed mobility comments: in chair on arrival  Transfers Overall transfer level: Needs assistance   Transfers: Sit to/from Stand Sit to Stand: Min assist         General transfer comment: cues for hand placement, assist for anterior translation and rise  Ambulation/Gait Ambulation/Gait assistance: Min assist Ambulation Distance (Feet): 200 Feet Assistive device: Rolling walker (2 wheeled) Gait Pattern/deviations: Step-through pattern;Decreased stride length;Trunk flexed   Gait velocity interpretation: Below normal speed for age/gender General Gait Details: cues for posture, decreased speed and denied increased distance today   Stairs            Wheelchair Mobility    Modified Rankin (Stroke Patients Only)       Balance                                            Cognition Arousal/Alertness: Awake/alert Behavior During Therapy: WFL for tasks assessed/performed Overall Cognitive Status:  Impaired/Different from baseline Area of Impairment: Memory                     Memory: Decreased recall of precautions                Exercises General Exercises - Lower Extremity Long Arc Quad: AROM;15 reps;Both;Seated Hip Flexion/Marching: AROM;15 reps;Both;Seated    General Comments        Pertinent Vitals/Pain Pain Assessment: No/denies pain    Home Living                      Prior Function            PT Goals (current goals can now be found in the care plan section) Progress towards PT goals: Progressing toward goals    Frequency           PT Plan Current plan remains appropriate    Co-evaluation              AM-PAC PT "6 Clicks" Daily Activity  Outcome Measure  Difficulty turning over in bed (including adjusting bedclothes, sheets and blankets)?: Total Difficulty moving from lying on back to sitting on the side of the bed? : Total Difficulty sitting down on and standing up from a chair with arms (e.g., wheelchair, bedside commode, etc,.)?: Total Help needed moving to and from a bed  to chair (including a wheelchair)?: A Little Help needed walking in hospital room?: A Little Help needed climbing 3-5 steps with a railing? : A Little 6 Click Score: 12    End of Session Equipment Utilized During Treatment: Gait belt Activity Tolerance: Patient tolerated treatment well Patient left: in chair;with call bell/phone within reach Nurse Communication: Mobility status;Precautions PT Visit Diagnosis: Difficulty in walking, not elsewhere classified (R26.2)     Time: 6269-4854 PT Time Calculation (min) (ACUTE ONLY): 24 min  Charges:  $Gait Training: 8-22 mins $Therapeutic Activity: 8-22 mins                    G Codes:       [name], PT [phone]    [name] 04/07/2017, 8:23 AM

## 2017-04-08 LAB — GLUCOSE, CAPILLARY
Glucose-Capillary: 99 mg/dL (ref 65–99)
Glucose-Capillary: 91 mg/dL (ref 65–99)
Glucose-Capillary: 125 mg/dL — ABNORMAL HIGH (ref 65–99)
Glucose-Capillary: 88 mg/dL (ref 65–99)

## 2017-04-08 NOTE — Progress Notes (Signed)
CARDIAC REHAB PHASE I   PRE:  Rate/Rhythm: 78 some paced beats  ? afib  BP:  Supine:   Sitting: 130/66  Standing:    SaO2: 100% RA on ear     Hard to register on finger  MODE:  Ambulation: 300 ft   POST:  Rate/Rhythm: 105  BP:  Supine:   Sitting: 132/70  Standing:    SaO2: 96 %RA ear 1002-10025 Pt ready to walk. Pt rocked to stand with some assistance needed. Pt walked 300 ft on RA with rolling walker and asst x 1. A little dizzy at sink when he performed mouth care. Pt stated his legs felt weak today. Encouraged him to stop and rest and do purse lip breathing as he was a little DOE. Sats good on ear. To recliner with call bell. Encouraged walks with staff.   [name], RN BSN  04/08/2017 10:22 AM

## 2017-04-08 NOTE — Progress Notes (Signed)
Pacing wires removed per order. Patient instructed on q1hr bed rest and q15 VS.

## 2017-04-08 NOTE — Progress Notes (Signed)
Patient given Benadryl for sleep. Patient still only slept 2-3 hours.

## 2017-04-08 NOTE — Progress Notes (Addendum)
      [address]       [place] [address]             [phone]      7 Days Post-Op Procedure(s) (LRB): CORONARY ARTERY BYPASS GRAFTING (CABG) x 2 (LIMA to DIAGONAL and SVG to RCA) WITH ENDOSCOPIC HARVESTING OF RIGHT SAPHENOUS VEIN (N/A) TRANSESOPHAGEAL ECHOCARDIOGRAM (TEE) (N/A) Subjective: Still only sleeping a few hours a night. Otherwise doing well.   Objective: Vital signs in last 24 hours: Temp:  [97.9 F (36.6 C)-98.5 F (36.9 C)] 97.9 F (36.6 C) (05/09 0411) Pulse Rate:  [60-91] 60 (05/09 0411) Cardiac Rhythm: Ventricular paced;Atrial fibrillation (05/08 1900) Resp:  [18-19] 18 (05/09 0411) BP: (112-134)/(50-62) 125/54 (05/09 0411) SpO2:  [95 %-98 %] 95 % (05/09 0411) Weight:  [91.7 kg (202 lb 3.2 oz)] 91.7 kg (202 lb 3.2 oz) (05/09 0411)     Intake/Output from previous day: 05/08 0701 - 05/09 0700 In: 360 [P.O.:360] Out: 150 [Urine:150] Intake/Output this shift: No intake/output data recorded.  General appearance: alert, cooperative and no distress Heart: regular rate and rhythm, S1, S2 normal, no murmur, click, rub or gallop Lungs: clear to auscultation bilaterally Abdomen: soft, non-tender; bowel sounds normal; no masses,  no organomegaly Extremities: 1+ non pitting pedal edema Wound: clean and dry  Lab Results:  Recent Labs  04/06/17 0528 04/07/17 0437  WBC 7.0 8.1  HGB 9.3* 9.7*  HCT 28.9* 30.5*  PLT 133* 187   BMET:  Recent Labs  04/06/17 0528 04/07/17 0437  NA 131* 133*  K 3.7 4.3  CL 100* 100*  CO2 26 25  GLUCOSE 120* 121*  BUN 42* 46*  CREATININE 1.53* 1.39*  CALCIUM 8.9 9.3    PT/INR: No results for input(s): LABPROT, INR in the last 72 hours. ABG    Component Value Date/Time   PHART 7.390 04/03/2017 0231   HCO3 23.6 04/03/2017 0231   TCO2 25 04/03/2017 0231   ACIDBASEDEF 1.0 04/03/2017 0231   O2SAT 59.2 04/06/2017 0500   CBG (last 3)   Recent Labs  04/07/17 1631 04/07/17 2045 04/08/17 0601    GLUCAP 119* 127* 99    Assessment/Plan: S/P Procedure(s) (LRB): CORONARY ARTERY BYPASS GRAFTING (CABG) x 2 (LIMA to DIAGONAL and SVG to RCA) WITH ENDOSCOPIC HARVESTING OF RIGHT SAPHENOUS VEIN (N/A) TRANSESOPHAGEAL ECHOCARDIOGRAM (TEE) (N/A)   CV-HR  underlying rate in the 70s. ICD in place. BP well controlled. Continue Amio PO, Lopressor, and pravastatin  Pulm-tolerating room air with good oxygen saturation Renal-creatinine trending down. Electrolytes okay. Weight slowly trending down. Lasix '20mg'$  daily H and H-stable, expected blood loss anemia.  Endo-blood glucose level well controlled Insominia-Benadryl prn, work on more activity during the day  Plan: Case management/social work working on [employer] placement. Discontinue EPW. Continue diuretics.     LOS: 8 days    Elgie Collard 04/08/2017  Plan snf tomorrow I have seen and examined [name] and agree with the above assessment  and plan.  Grace Isaac MD Beeper [phone] Office [phone] 04/08/2017 11:59 AM

## 2017-04-09 DIAGNOSIS — R069 Unspecified abnormalities of breathing: Secondary | ICD-10-CM | POA: Diagnosis not present

## 2017-04-09 DIAGNOSIS — I82C12 Acute embolism and thrombosis of left internal jugular vein: Secondary | ICD-10-CM | POA: Diagnosis present

## 2017-04-09 DIAGNOSIS — Z902 Acquired absence of lung [part of]: Secondary | ICD-10-CM | POA: Diagnosis not present

## 2017-04-09 DIAGNOSIS — Z85118 Personal history of other malignant neoplasm of bronchus and lung: Secondary | ICD-10-CM | POA: Diagnosis not present

## 2017-04-09 DIAGNOSIS — Y95 Nosocomial condition: Secondary | ICD-10-CM | POA: Diagnosis present

## 2017-04-09 DIAGNOSIS — I4891 Unspecified atrial fibrillation: Secondary | ICD-10-CM | POA: Diagnosis not present

## 2017-04-09 DIAGNOSIS — E784 Other hyperlipidemia: Secondary | ICD-10-CM | POA: Diagnosis not present

## 2017-04-09 DIAGNOSIS — I481 Persistent atrial fibrillation: Secondary | ICD-10-CM | POA: Diagnosis present

## 2017-04-09 DIAGNOSIS — I82A12 Acute embolism and thrombosis of left axillary vein: Secondary | ICD-10-CM | POA: Diagnosis not present

## 2017-04-09 DIAGNOSIS — N179 Acute kidney failure, unspecified: Secondary | ICD-10-CM | POA: Diagnosis not present

## 2017-04-09 DIAGNOSIS — R609 Edema, unspecified: Secondary | ICD-10-CM | POA: Diagnosis not present

## 2017-04-09 DIAGNOSIS — R062 Wheezing: Secondary | ICD-10-CM | POA: Diagnosis not present

## 2017-04-09 DIAGNOSIS — E785 Hyperlipidemia, unspecified: Secondary | ICD-10-CM | POA: Diagnosis present

## 2017-04-09 DIAGNOSIS — E876 Hypokalemia: Secondary | ICD-10-CM | POA: Diagnosis not present

## 2017-04-09 DIAGNOSIS — I509 Heart failure, unspecified: Secondary | ICD-10-CM | POA: Diagnosis not present

## 2017-04-09 DIAGNOSIS — Z7982 Long term (current) use of aspirin: Secondary | ICD-10-CM | POA: Diagnosis not present

## 2017-04-09 DIAGNOSIS — J440 Chronic obstructive pulmonary disease with acute lower respiratory infection: Secondary | ICD-10-CM | POA: Diagnosis present

## 2017-04-09 DIAGNOSIS — J9600 Acute respiratory failure, unspecified whether with hypoxia or hypercapnia: Secondary | ICD-10-CM | POA: Diagnosis not present

## 2017-04-09 DIAGNOSIS — I950 Idiopathic hypotension: Secondary | ICD-10-CM | POA: Diagnosis not present

## 2017-04-09 DIAGNOSIS — I5043 Acute on chronic combined systolic (congestive) and diastolic (congestive) heart failure: Secondary | ICD-10-CM | POA: Diagnosis not present

## 2017-04-09 DIAGNOSIS — N390 Urinary tract infection, site not specified: Secondary | ICD-10-CM | POA: Diagnosis present

## 2017-04-09 DIAGNOSIS — R7989 Other specified abnormal findings of blood chemistry: Secondary | ICD-10-CM | POA: Diagnosis not present

## 2017-04-09 DIAGNOSIS — Z95810 Presence of automatic (implantable) cardiac defibrillator: Secondary | ICD-10-CM | POA: Diagnosis not present

## 2017-04-09 DIAGNOSIS — D631 Anemia in chronic kidney disease: Secondary | ICD-10-CM | POA: Diagnosis not present

## 2017-04-09 DIAGNOSIS — E7800 Pure hypercholesterolemia, unspecified: Secondary | ICD-10-CM | POA: Diagnosis not present

## 2017-04-09 DIAGNOSIS — I480 Paroxysmal atrial fibrillation: Secondary | ICD-10-CM | POA: Diagnosis not present

## 2017-04-09 DIAGNOSIS — J9602 Acute respiratory failure with hypercapnia: Secondary | ICD-10-CM | POA: Diagnosis not present

## 2017-04-09 DIAGNOSIS — I82B12 Acute embolism and thrombosis of left subclavian vein: Secondary | ICD-10-CM | POA: Diagnosis present

## 2017-04-09 DIAGNOSIS — I130 Hypertensive heart and chronic kidney disease with heart failure and stage 1 through stage 4 chronic kidney disease, or unspecified chronic kidney disease: Secondary | ICD-10-CM | POA: Diagnosis present

## 2017-04-09 DIAGNOSIS — E119 Type 2 diabetes mellitus without complications: Secondary | ICD-10-CM | POA: Diagnosis not present

## 2017-04-09 DIAGNOSIS — J189 Pneumonia, unspecified organism: Secondary | ICD-10-CM | POA: Diagnosis not present

## 2017-04-09 DIAGNOSIS — E1122 Type 2 diabetes mellitus with diabetic chronic kidney disease: Secondary | ICD-10-CM | POA: Diagnosis not present

## 2017-04-09 DIAGNOSIS — J9601 Acute respiratory failure with hypoxia: Secondary | ICD-10-CM | POA: Diagnosis not present

## 2017-04-09 DIAGNOSIS — I5023 Acute on chronic systolic (congestive) heart failure: Secondary | ICD-10-CM | POA: Diagnosis not present

## 2017-04-09 DIAGNOSIS — N183 Chronic kidney disease, stage 3 (moderate): Secondary | ICD-10-CM | POA: Diagnosis present

## 2017-04-09 DIAGNOSIS — E872 Acidosis: Secondary | ICD-10-CM | POA: Diagnosis present

## 2017-04-09 DIAGNOSIS — R600 Localized edema: Secondary | ICD-10-CM | POA: Diagnosis not present

## 2017-04-09 DIAGNOSIS — Z4502 Encounter for adjustment and management of automatic implantable cardiac defibrillator: Secondary | ICD-10-CM | POA: Diagnosis not present

## 2017-04-09 DIAGNOSIS — I214 Non-ST elevation (NSTEMI) myocardial infarction: Secondary | ICD-10-CM | POA: Diagnosis present

## 2017-04-09 DIAGNOSIS — Z951 Presence of aortocoronary bypass graft: Secondary | ICD-10-CM | POA: Diagnosis not present

## 2017-04-09 DIAGNOSIS — E43 Unspecified severe protein-calorie malnutrition: Secondary | ICD-10-CM | POA: Diagnosis present

## 2017-04-09 DIAGNOSIS — I5022 Chronic systolic (congestive) heart failure: Secondary | ICD-10-CM | POA: Diagnosis not present

## 2017-04-09 DIAGNOSIS — I82629 Acute embolism and thrombosis of deep veins of unspecified upper extremity: Secondary | ICD-10-CM | POA: Diagnosis not present

## 2017-04-09 DIAGNOSIS — I350 Nonrheumatic aortic (valve) stenosis: Secondary | ICD-10-CM | POA: Diagnosis not present

## 2017-04-09 DIAGNOSIS — I420 Dilated cardiomyopathy: Secondary | ICD-10-CM | POA: Diagnosis present

## 2017-04-09 DIAGNOSIS — R0600 Dyspnea, unspecified: Secondary | ICD-10-CM | POA: Diagnosis not present

## 2017-04-09 DIAGNOSIS — M7989 Other specified soft tissue disorders: Secondary | ICD-10-CM | POA: Diagnosis not present

## 2017-04-09 DIAGNOSIS — I2510 Atherosclerotic heart disease of native coronary artery without angina pectoris: Secondary | ICD-10-CM | POA: Diagnosis not present

## 2017-04-09 DIAGNOSIS — K2270 Barrett's esophagus without dysplasia: Secondary | ICD-10-CM | POA: Diagnosis not present

## 2017-04-09 DIAGNOSIS — N189 Chronic kidney disease, unspecified: Secondary | ICD-10-CM | POA: Diagnosis not present

## 2017-04-09 DIAGNOSIS — K219 Gastro-esophageal reflux disease without esophagitis: Secondary | ICD-10-CM | POA: Diagnosis not present

## 2017-04-09 DIAGNOSIS — I25810 Atherosclerosis of coronary artery bypass graft(s) without angina pectoris: Secondary | ICD-10-CM | POA: Diagnosis not present

## 2017-04-09 DIAGNOSIS — J441 Chronic obstructive pulmonary disease with (acute) exacerbation: Secondary | ICD-10-CM | POA: Diagnosis present

## 2017-04-09 DIAGNOSIS — E039 Hypothyroidism, unspecified: Secondary | ICD-10-CM | POA: Diagnosis not present

## 2017-04-09 DIAGNOSIS — R0603 Acute respiratory distress: Secondary | ICD-10-CM | POA: Diagnosis not present

## 2017-04-09 DIAGNOSIS — R0602 Shortness of breath: Secondary | ICD-10-CM | POA: Diagnosis not present

## 2017-04-09 LAB — GLUCOSE, CAPILLARY
Glucose-Capillary: 115 mg/dL — ABNORMAL HIGH (ref 65–99)
Glucose-Capillary: 94 mg/dL (ref 65–99)

## 2017-04-09 MED ORDER — AMIODARONE HCL 200 MG PO TABS: 200.0000 mg | ORAL_TABLET | Freq: Every day | ORAL | 1 refills | Status: AC

## 2017-04-09 MED ORDER — IPRATROPIUM-ALBUTEROL 0.5-2.5 (3) MG/3ML IN SOLN
3.0000 mL | Freq: Four times a day (QID) | RESPIRATORY_TRACT | Status: DC
  Administered 2017-04-09: 3 mL via RESPIRATORY_TRACT
  Filled 2017-04-09: qty 3

## 2017-04-09 MED ORDER — PANTOPRAZOLE SODIUM 40 MG PO TBEC: 40.0000 mg | DELAYED_RELEASE_TABLET | Freq: Every day | ORAL | 1 refills | Status: AC

## 2017-04-09 MED ORDER — FUROSEMIDE 20 MG PO TABS: 20.0000 mg | ORAL_TABLET | Freq: Every day | ORAL | 0 refills | Status: AC

## 2017-04-09 MED ORDER — POTASSIUM CHLORIDE ER 10 MEQ PO TBCR: 10.0000 meq | EXTENDED_RELEASE_TABLET | Freq: Every day | ORAL | 0 refills | Status: AC

## 2017-04-09 MED ORDER — DOCUSATE SODIUM 100 MG PO CAPS: 200.0000 mg | ORAL_CAPSULE | Freq: Every day | ORAL | 0 refills | Status: AC

## 2017-04-09 MED ORDER — IPRATROPIUM-ALBUTEROL 0.5-2.5 (3) MG/3ML IN SOLN: 3.0000 mL | Freq: Four times a day (QID) | RESPIRATORY_TRACT | 1 refills | Status: AC

## 2017-04-09 MED ORDER — DIPHENHYDRAMINE HCL 25 MG PO CAPS: 25.0000 mg | ORAL_CAPSULE | Freq: Every evening | ORAL | 0 refills | Status: AC | PRN

## 2017-04-09 MED ORDER — METOPROLOL TARTRATE 25 MG PO TABS: 12.5000 mg | ORAL_TABLET | Freq: Two times a day (BID) | ORAL | 1 refills | Status: AC

## 2017-04-09 MED ORDER — TRAMADOL HCL 50 MG PO TABS: 50.0000 mg | ORAL_TABLET | Freq: Four times a day (QID) | ORAL | 0 refills | Status: AC | PRN

## 2017-04-09 NOTE — Care Management Note (Signed)
Case Management Note  Patient Details  Name: JOSEEDUARDO BRIX MRN: [id number] Date of Birth: [birth year]  Subjective/Objective:              Patient with order to DC. Per CSW, placement at SNF augmented by family. Anticipate DC today, no CM needs.      Action/Plan:   Expected Discharge Date:  04/09/17               Expected Discharge Plan:  Skilled Nursing Facility  In-House Referral:  Clinical Social Work  Discharge planning Services     Post Acute Care Choice:    Choice offered to:     DME Arranged:    DME Agency:     HH Arranged:    [place] Agency:     Status of Service:  Completed, signed off  If discussed at [employer], dates discussed:    Additional Comments:  Carles Collet, RN 04/09/2017, 2:30 PM

## 2017-04-09 NOTE — Progress Notes (Signed)
Clinical Social Worker facilitated patient discharge including contacting patient family and facility to confirm patient discharge plans.  Clinical information faxed to facility and family agreeable with plan.  Per patient, his son will be transporting him to [facility] .  RN Vincente Liberty to call [phone] (pt will go in rm#[id number]) report prior to discharge.  Clinical Social Worker will sign off for now as social work intervention is no longer needed. Please consult [place] again if new need arises.  Rhea Pink, MSW, [place]

## 2017-04-09 NOTE — Progress Notes (Signed)
[name] to be D/C'd Skilled nursing facility per MD order. Discussed with the patient and all questions fully answered.    VVS, Skin clean, dry and intact without evidence of skin break down, no evidence of skin tears noted.  IAn After Visit Summary was printed and given to the patient.  Patient escorted via [place], and D/C home via private auto.  [place]yn[place] Numbers  04/09/2017 2:52 PM

## 2017-04-09 NOTE — Progress Notes (Signed)
Clinical Social Worker spoke with patient at bedside yesterday afternoon 04/08/17 about SNF placement. CSW made patient aware that he had 12 bed offers from numerous SNF in the area. Patient stated that he would prefer [facility] since the facility is close to home. CSW made patient aware that [facility] SNF has not made a bed offer on behalf of patient. CSW explained to patient that she will reach out to facility to see if they have bed availability and why they have not made bed offers. CSW contacted facility and left voicemail for admissions coordinator at [facility] to contact [facility].  Admission Coordinator for [place] [place] contacted CSW and explained that she has been out of the office for 3 days and was not aware of referral. [facility] made a bed offer this morning on behalf of patient. MD and patient made aware of bed offers  Rhea Pink, MSW,  [place]

## 2017-04-09 NOTE — NC FL2 (Signed)
[place] LEVEL OF CARE SCREENING TOOL     IDENTIFICATION  Patient Name: Steven Everett Birthdate: [birth year] Sex: male Admission Date (Current Location): 03/30/2017  [facility] and [place] Number:  Herbalist and Address:  [facility], [place] [address], [place], [place] [address]      Provider Number: [id number]  Attending Physician Name and Address:  Grace Isaac, MD  Relative Name and Phone Number:       Current Level of Care: Hospital Recommended Level of Care: [place] Prior Approval Number:    Date Approved/Denied:   PASRR Number: [id number]  Discharge Plan: SNF    Current Diagnoses: Patient Active Problem List   Diagnosis Date Noted  . Coronary artery disease 04/01/2017  . NSTEMI (non-ST elevated myocardial infarction) ([place]) 03/30/2017  . Persistent atrial fibrillation ([place]) 01/31/2016  . IFG (impaired fasting glucose) 05/15/2015  . CKD (chronic kidney disease) 05/15/2015  . ICD (implantable cardioverter-defibrillator) in place 01/10/2015  . Atrial fibrillation ([place]) 12/14/2013  . Sick sinus syndrome ([place]) 12/14/2013  . Chronic systolic heart failure ([place]) 08/12/2013  . Symptomatic bradycardia 08/12/2013  . Hyperlipidemia 07/20/2013  . CAD (coronary artery disease) 04/06/2013  . Congestive dilated cardiomyopathy ([place]) 01/26/2013  . Aortic stenosis 01/26/2013  . SHOULDER PAIN, RIGHT 02/03/2011  . Benign prostatic hypertrophy with urinary obstruction 07/20/2008  . ADENOMATOUS COLONIC POLYP 01/24/2008  . HEMORRHOIDS, INTERNAL 01/11/2008  . DIVERTICULOSIS, COLON 01/11/2008  . Hepatic cirrhosis ([place]) 11/16/2007  . History of lung cancer 10/06/2007    Class: History of  . SEBORRHEIC DERMATITIS 10/04/2007  . BARRETTS ESOPHAGUS 01/01/2007  . Hypothyroidism 12/22/2006  . GERD 12/22/2006    Orientation RESPIRATION BLADDER Height & Weight     Self, Time, Situation, Place  Normal Continent  Weight: 200 lb (90.7 kg) Height:  '6\' 1"'$  (185.4 cm)  BEHAVIORAL SYMPTOMS/MOOD NEUROLOGICAL BOWEL NUTRITION STATUS      Continent Diet (see dc summary)  AMBULATORY STATUS COMMUNICATION OF NEEDS Skin   Extensive Assist Verbally Surgical wounds, Skin abrasions, Bruising                       Personal Care Assistance Level of Assistance  Bathing, Feeding, Dressing Bathing Assistance: Limited assistance Feeding assistance: Independent Dressing Assistance: Limited assistance     Functional Limitations Info  Sight, Hearing, Speech Sight Info: Adequate Hearing Info: Adequate Speech Info: Adequate    SPECIAL CARE FACTORS FREQUENCY  PT (By licensed PT), OT (By licensed OT)     PT Frequency: 5x OT Frequency: 5x            Contractures Contractures Info: Not present    Additional Factors Info  Code Status, Allergies Code Status Info: Full Code Allergies Info: Atorvastatin           Current Medications (04/09/2017):  This is the current hospital active medication list Current Facility-Administered Medications  Medication Dose Route Frequency Provider Last Rate Last Dose  . 0.9 %  sodium chloride infusion  250 mL Intravenous PRN Grace Isaac, MD      . alfuzosin (UROXATRAL) 24 hr tablet 10 mg  10 mg Oral Q breakfast Lars Pinks M, PA-C   10 mg at 04/08/17 0867  . amiodarone (PACERONE) tablet 200 mg  200 mg Oral Q12H Grace Isaac, MD   200 mg at 04/09/17 0900   Followed by  . [START ON 04/13/2017] amiodarone (PACERONE) tablet 200 mg  200  mg Oral Daily Grace Isaac, MD      . aspirin EC tablet 81 mg  81 mg Oral Daily Grace Isaac, MD   81 mg at 04/09/17 0859  . bisacodyl (DULCOLAX) EC tablet 10 mg  10 mg Oral Daily PRN Grace Isaac, MD       Or  . bisacodyl (DULCOLAX) suppository 10 mg  10 mg Rectal Daily PRN Grace Isaac, MD      . Chlorhexidine Gluconate Cloth 2 % PADS 6 each  6 each Topical Daily Grace Isaac, MD   6 each  at 04/06/17 1000  . diphenhydrAMINE (BENADRYL) capsule 25 mg  25 mg Oral QHS PRN Elgie Collard, PA-C   25 mg at 04/07/17 2254  . docusate sodium (COLACE) capsule 200 mg  200 mg Oral Daily Grace Isaac, MD   200 mg at 04/09/17 0859  . enoxaparin (LOVENOX) injection 30 mg  30 mg Subcutaneous Q24H Grace Isaac, MD   30 mg at 04/09/17 0900  . finasteride (PROSCAR) tablet 5 mg  5 mg Oral Daily Lars Pinks M, PA-C   5 mg at 04/09/17 4742  . guaiFENesin (MUCINEX) 12 hr tablet 600 mg  600 mg Oral Q12H PRN Grace Isaac, MD   600 mg at 04/08/17 1757  . insulin aspart (novoLOG) injection 0-24 Units  0-24 Units Subcutaneous TID AC & HS Grace Isaac, MD   2 Units at 04/06/17 [phone]  . ipratropium-albuterol (DUONEB) 0.5-2.5 (3) MG/3ML nebulizer solution 3 mL  3 mL Nebulization Q6H Barrett, Erin R, PA-C      . levothyroxine (SYNTHROID, LEVOTHROID) tablet 112 mcg  112 mcg Oral QAC breakfast Chakravartti, Jaidip, MD   112 mcg at 04/09/17 0626  . MEDLINE mouth rinse  15 mL Mouth Rinse BID Grace Isaac, MD   15 mL at 04/08/17 2128  . metoprolol tartrate (LOPRESSOR) tablet 12.5 mg  12.5 mg Oral BID Grace Isaac, MD   12.5 mg at 04/09/17 0901  . moving right along book   Does not apply Once Grace Isaac, MD      . ondansetron [facility]) tablet 4 mg  4 mg Oral Q6H PRN Grace Isaac, MD       Or  . ondansetron [facility]) injection 4 mg  4 mg Intravenous Q6H PRN Grace Isaac, MD      . pantoprazole (PROTONIX) EC tablet 40 mg  40 mg Oral QAC breakfast Grace Isaac, MD   40 mg at 04/09/17 0900  . pravastatin (PRAVACHOL) tablet 40 mg  40 mg Oral Daily Chakravartti, Jaidip, MD   40 mg at 04/09/17 0859  . sodium chloride flush (NS) 0.9 % injection 10-40 mL  10-40 mL Intracatheter Q12H Grace Isaac, MD   20 mL at 04/06/17 1000  . sodium chloride flush (NS) 0.9 % injection 10-40 mL  10-40 mL Intracatheter PRN Grace Isaac, MD   10 mL at 04/07/17 0441  .  sodium chloride flush (NS) 0.9 % injection 3 mL  3 mL Intravenous Q12H Grace Isaac, MD   3 mL at 04/08/17 2127  . sodium chloride flush (NS) 0.9 % injection 3 mL  3 mL Intravenous PRN Grace Isaac, MD      . traMADol Veat[facility]bon) tablet 50 mg  50 mg Oral Q4H PRN Lars Pinks M, PA-C   50 mg at 04/05/17 0127     Discharge Medications: Please see discharge summary  for a list of discharge medications.  Relevant Imaging Results:  Relevant Lab Results:   Additional Information SSN: [id number]  Wende Neighbors, LCSW

## 2017-04-09 NOTE — Progress Notes (Signed)
[id number] Education completed with pt who voiced understanding. Encouraged walking and IS. Discussed CRP 2 and will refer to [place] program. Gave heart healthy diet. Offered to show discharge video but pt did not care to view at this time. Offered walk but wanted to save energy for discharge. Reviewed sternal precautions. [name] RN BSN 04/09/2017 11:18 AM

## 2017-04-09 NOTE — Progress Notes (Addendum)
      [address]       [place] [address]             [phone]      8 Days Post-Op Procedure(s) (LRB): CORONARY ARTERY BYPASS GRAFTING (CABG) x 2 (LIMA to DIAGONAL and SVG to RCA) WITH ENDOSCOPIC HARVESTING OF RIGHT SAPHENOUS VEIN (N/A) TRANSESOPHAGEAL ECHOCARDIOGRAM (TEE) (N/A) Subjective: Feels okay this morning. Shares that his son is coming early afternoon and can provide transportation to the facility  Objective: Vital signs in last 24 hours: Temp:  [97.7 F (36.5 C)-98 F (36.7 C)] 97.7 F (36.5 C) (05/10 0500) Pulse Rate:  [70-86] 78 (05/10 0500) Cardiac Rhythm: Normal sinus rhythm;Bundle branch block (05/09 1900) Resp:  [18] 18 (05/10 0500) BP: (123-152)/(60-85) 133/69 (05/10 0500) SpO2:  [99 %] 99 % (05/10 0500) Weight:  [90.7 kg (200 lb)] 90.7 kg (200 lb) (05/10 0726)     Intake/Output from previous day: 05/09 0701 - 05/10 0700 In: -  Out: 375 [Urine:375] Intake/Output this shift: No intake/output data recorded.  General appearance: alert, cooperative and no distress Heart: regular rate and rhythm, S1, S2 normal, no murmur, click, rub or gallop Lungs: clear to auscultation bilaterally Abdomen: soft, non-tender; bowel sounds normal; no masses,  no organomegaly Extremities: 1+ nonpitting pedal edema Wound: clean and dry without drainage  Lab Results:  Recent Labs  04/07/17 0437  WBC 8.1  HGB 9.7*  HCT 30.5*  PLT 187   BMET:  Recent Labs  04/07/17 0437  NA 133*  K 4.3  CL 100*  CO2 25  GLUCOSE 121*  BUN 46*  CREATININE 1.39*  CALCIUM 9.3    PT/INR: No results for input(s): LABPROT, INR in the last 72 hours. ABG    Component Value Date/Time   PHART 7.390 04/03/2017 0231   HCO3 23.6 04/03/2017 0231   TCO2 25 04/03/2017 0231   ACIDBASEDEF 1.0 04/03/2017 0231   O2SAT 59.2 04/06/2017 0500   CBG (last 3)   Recent Labs  04/08/17 1748 04/08/17 2059 04/09/17 0625  GLUCAP 125* 88 115*    Assessment/Plan: S/P  Procedure(s) (LRB): CORONARY ARTERY BYPASS GRAFTING (CABG) x 2 (LIMA to DIAGONAL and SVG to RCA) WITH ENDOSCOPIC HARVESTING OF RIGHT SAPHENOUS VEIN (N/A) TRANSESOPHAGEAL ECHOCARDIOGRAM (TEE) (N/A)  CV-HR  NSR 70s-80s.ICD in place. BP well controlled. Continue Amio PO, Lopressor, and pravastatin. Pulm-tolerating room air with good oxygen saturation. Encourage IS and pulm toilet Renal-creatinine trending down. Electrolytes okay. Weight slowly trending down. Lasix '20mg'$  daily H and H-stable, expected blood loss anemia.  Endo-blood glucose level well controlled [place]omin[place]-Benadryl prn, work on more activity during the day  Plan: To SNF once bed is available.  Continue diuretics. Continue ambulation TID.    LOS: 9 days    Elgie Collard 04/09/2017  Waiting for snf today I have seen and examined [name] and agree with the above assessment  and plan.  Grace Isaac MD Beeper [phone] Office [phone] 04/09/2017 8:47 AM

## 2017-04-13 DIAGNOSIS — R062 Wheezing: Secondary | ICD-10-CM | POA: Diagnosis not present

## 2017-04-13 DIAGNOSIS — R0602 Shortness of breath: Secondary | ICD-10-CM | POA: Diagnosis not present

## 2017-04-13 DIAGNOSIS — R609 Edema, unspecified: Secondary | ICD-10-CM | POA: Diagnosis not present

## 2017-04-15 ENCOUNTER — Telehealth (HOSPITAL_COMMUNITY): Payer: Self-pay

## 2017-04-15 DIAGNOSIS — R7989 Other specified abnormal findings of blood chemistry: Secondary | ICD-10-CM | POA: Diagnosis not present

## 2017-04-15 DIAGNOSIS — K219 Gastro-esophageal reflux disease without esophagitis: Secondary | ICD-10-CM | POA: Diagnosis not present

## 2017-04-15 DIAGNOSIS — R600 Localized edema: Secondary | ICD-10-CM | POA: Diagnosis not present

## 2017-04-15 DIAGNOSIS — Z951 Presence of aortocoronary bypass graft: Secondary | ICD-10-CM | POA: Diagnosis not present

## 2017-04-15 DIAGNOSIS — Z85118 Personal history of other malignant neoplasm of bronchus and lung: Secondary | ICD-10-CM | POA: Diagnosis not present

## 2017-04-15 DIAGNOSIS — I214 Non-ST elevation (NSTEMI) myocardial infarction: Secondary | ICD-10-CM | POA: Diagnosis not present

## 2017-04-15 DIAGNOSIS — E119 Type 2 diabetes mellitus without complications: Secondary | ICD-10-CM | POA: Diagnosis not present

## 2017-04-15 DIAGNOSIS — I2510 Atherosclerotic heart disease of native coronary artery without angina pectoris: Secondary | ICD-10-CM | POA: Diagnosis not present

## 2017-04-15 DIAGNOSIS — I509 Heart failure, unspecified: Secondary | ICD-10-CM | POA: Diagnosis not present

## 2017-04-15 DIAGNOSIS — K2270 Barrett's esophagus without dysplasia: Secondary | ICD-10-CM | POA: Diagnosis not present

## 2017-04-15 DIAGNOSIS — E785 Hyperlipidemia, unspecified: Secondary | ICD-10-CM | POA: Diagnosis not present

## 2017-04-15 DIAGNOSIS — I4891 Unspecified atrial fibrillation: Secondary | ICD-10-CM | POA: Diagnosis not present

## 2017-04-15 NOTE — Telephone Encounter (Signed)
Verified Medicare A/B & AARP insurance benefits through Passport Reference [phone] & [phone].... Steven Everett

## 2017-04-16 ENCOUNTER — Telehealth: Payer: Self-pay | Admitting: Cardiology

## 2017-04-16 ENCOUNTER — Telehealth: Payer: Self-pay

## 2017-04-16 NOTE — Telephone Encounter (Signed)
Patient called to report he is current a in rehab unit for recovery post CABG.  He reported he is doing well but unsure when he will be discharged.  Advised will reschedule ICM remote transmission to 05/04/2017.

## 2017-04-16 NOTE — Telephone Encounter (Signed)
LMOVM reminding pt to send remote transmission.   

## 2017-04-20 ENCOUNTER — Emergency Department (HOSPITAL_BASED_OUTPATIENT_CLINIC_OR_DEPARTMENT_OTHER): Admit: 2017-04-20 | Discharge: 2017-04-20 | Disposition: A | Discharge status: Home / Self Care | Payer: Medicare Other

## 2017-04-20 ENCOUNTER — Emergency Department (HOSPITAL_COMMUNITY): Payer: Medicare Other

## 2017-04-20 ENCOUNTER — Encounter (HOSPITAL_COMMUNITY): Payer: Self-pay

## 2017-04-20 DIAGNOSIS — N189 Chronic kidney disease, unspecified: Secondary | ICD-10-CM

## 2017-04-20 DIAGNOSIS — R0600 Dyspnea, unspecified: Secondary | ICD-10-CM | POA: Diagnosis not present

## 2017-04-20 DIAGNOSIS — Z96641 Presence of right artificial hip joint: Secondary | ICD-10-CM | POA: Diagnosis present

## 2017-04-20 DIAGNOSIS — E43 Unspecified severe protein-calorie malnutrition: Secondary | ICD-10-CM | POA: Insufficient documentation

## 2017-04-20 DIAGNOSIS — N179 Acute kidney failure, unspecified: Secondary | ICD-10-CM | POA: Diagnosis not present

## 2017-04-20 DIAGNOSIS — R1031 Right lower quadrant pain: Secondary | ICD-10-CM

## 2017-04-20 DIAGNOSIS — Z95810 Presence of automatic (implantable) cardiac defibrillator: Secondary | ICD-10-CM | POA: Diagnosis present

## 2017-04-20 DIAGNOSIS — Z803 Family history of malignant neoplasm of breast: Secondary | ICD-10-CM

## 2017-04-20 DIAGNOSIS — R0602 Shortness of breath: Secondary | ICD-10-CM

## 2017-04-20 DIAGNOSIS — Y95 Nosocomial condition: Secondary | ICD-10-CM | POA: Diagnosis present

## 2017-04-20 DIAGNOSIS — J9600 Acute respiratory failure, unspecified whether with hypoxia or hypercapnia: Secondary | ICD-10-CM | POA: Diagnosis not present

## 2017-04-20 DIAGNOSIS — I5043 Acute on chronic combined systolic (congestive) and diastolic (congestive) heart failure: Secondary | ICD-10-CM | POA: Diagnosis present

## 2017-04-20 DIAGNOSIS — M7989 Other specified soft tissue disorders: Secondary | ICD-10-CM

## 2017-04-20 DIAGNOSIS — N390 Urinary tract infection, site not specified: Secondary | ICD-10-CM | POA: Diagnosis present

## 2017-04-20 DIAGNOSIS — R0603 Acute respiratory distress: Secondary | ICD-10-CM

## 2017-04-20 DIAGNOSIS — I82409 Acute embolism and thrombosis of unspecified deep veins of unspecified lower extremity: Secondary | ICD-10-CM | POA: Diagnosis present

## 2017-04-20 DIAGNOSIS — Z7901 Long term (current) use of anticoagulants: Secondary | ICD-10-CM

## 2017-04-20 DIAGNOSIS — I480 Paroxysmal atrial fibrillation: Secondary | ICD-10-CM | POA: Diagnosis present

## 2017-04-20 DIAGNOSIS — Z87891 Personal history of nicotine dependence: Secondary | ICD-10-CM

## 2017-04-20 DIAGNOSIS — J9602 Acute respiratory failure with hypercapnia: Secondary | ICD-10-CM | POA: Diagnosis not present

## 2017-04-20 DIAGNOSIS — E785 Hyperlipidemia, unspecified: Secondary | ICD-10-CM | POA: Diagnosis present

## 2017-04-20 DIAGNOSIS — Z923 Personal history of irradiation: Secondary | ICD-10-CM

## 2017-04-20 DIAGNOSIS — I350 Nonrheumatic aortic (valve) stenosis: Secondary | ICD-10-CM | POA: Diagnosis present

## 2017-04-20 DIAGNOSIS — Z801 Family history of malignant neoplasm of trachea, bronchus and lung: Secondary | ICD-10-CM

## 2017-04-20 DIAGNOSIS — N400 Enlarged prostate without lower urinary tract symptoms: Secondary | ICD-10-CM | POA: Diagnosis present

## 2017-04-20 DIAGNOSIS — J189 Pneumonia, unspecified organism: Secondary | ICD-10-CM | POA: Diagnosis present

## 2017-04-20 DIAGNOSIS — E039 Hypothyroidism, unspecified: Secondary | ICD-10-CM | POA: Diagnosis present

## 2017-04-20 DIAGNOSIS — Z6829 Body mass index (BMI) 29.0-29.9, adult: Secondary | ICD-10-CM

## 2017-04-20 DIAGNOSIS — I25810 Atherosclerosis of coronary artery bypass graft(s) without angina pectoris: Secondary | ICD-10-CM

## 2017-04-20 DIAGNOSIS — N183 Chronic kidney disease, stage 3 (moderate): Secondary | ICD-10-CM | POA: Diagnosis present

## 2017-04-20 DIAGNOSIS — Z833 Family history of diabetes mellitus: Secondary | ICD-10-CM

## 2017-04-20 DIAGNOSIS — J9601 Acute respiratory failure with hypoxia: Secondary | ICD-10-CM | POA: Diagnosis not present

## 2017-04-20 DIAGNOSIS — J811 Chronic pulmonary edema: Secondary | ICD-10-CM

## 2017-04-20 DIAGNOSIS — Z951 Presence of aortocoronary bypass graft: Secondary | ICD-10-CM

## 2017-04-20 DIAGNOSIS — J90 Pleural effusion, not elsewhere classified: Secondary | ICD-10-CM

## 2017-04-20 DIAGNOSIS — R52 Pain, unspecified: Secondary | ICD-10-CM

## 2017-04-20 DIAGNOSIS — D631 Anemia in chronic kidney disease: Secondary | ICD-10-CM | POA: Diagnosis present

## 2017-04-20 DIAGNOSIS — I214 Non-ST elevation (NSTEMI) myocardial infarction: Secondary | ICD-10-CM | POA: Diagnosis present

## 2017-04-20 DIAGNOSIS — K5730 Diverticulosis of large intestine without perforation or abscess without bleeding: Secondary | ICD-10-CM | POA: Diagnosis present

## 2017-04-20 DIAGNOSIS — I82B12 Acute embolism and thrombosis of left subclavian vein: Secondary | ICD-10-CM | POA: Diagnosis not present

## 2017-04-20 DIAGNOSIS — B9620 Unspecified Escherichia coli [E. coli] as the cause of diseases classified elsewhere: Secondary | ICD-10-CM | POA: Diagnosis present

## 2017-04-20 DIAGNOSIS — I959 Hypotension, unspecified: Secondary | ICD-10-CM | POA: Diagnosis present

## 2017-04-20 DIAGNOSIS — I82C12 Acute embolism and thrombosis of left internal jugular vein: Secondary | ICD-10-CM | POA: Diagnosis present

## 2017-04-20 DIAGNOSIS — I5022 Chronic systolic (congestive) heart failure: Secondary | ICD-10-CM | POA: Diagnosis present

## 2017-04-20 DIAGNOSIS — I130 Hypertensive heart and chronic kidney disease with heart failure and stage 1 through stage 4 chronic kidney disease, or unspecified chronic kidney disease: Secondary | ICD-10-CM | POA: Diagnosis present

## 2017-04-20 DIAGNOSIS — Z7982 Long term (current) use of aspirin: Secondary | ICD-10-CM

## 2017-04-20 DIAGNOSIS — I82A12 Acute embolism and thrombosis of left axillary vein: Secondary | ICD-10-CM | POA: Diagnosis present

## 2017-04-20 DIAGNOSIS — E1122 Type 2 diabetes mellitus with diabetic chronic kidney disease: Secondary | ICD-10-CM | POA: Diagnosis present

## 2017-04-20 DIAGNOSIS — I481 Persistent atrial fibrillation: Secondary | ICD-10-CM | POA: Diagnosis present

## 2017-04-20 DIAGNOSIS — J441 Chronic obstructive pulmonary disease with (acute) exacerbation: Secondary | ICD-10-CM | POA: Diagnosis present

## 2017-04-20 DIAGNOSIS — Z902 Acquired absence of lung [part of]: Secondary | ICD-10-CM

## 2017-04-20 DIAGNOSIS — J440 Chronic obstructive pulmonary disease with acute lower respiratory infection: Secondary | ICD-10-CM | POA: Diagnosis present

## 2017-04-20 DIAGNOSIS — I420 Dilated cardiomyopathy: Secondary | ICD-10-CM | POA: Diagnosis present

## 2017-04-20 DIAGNOSIS — I2510 Atherosclerotic heart disease of native coronary artery without angina pectoris: Secondary | ICD-10-CM | POA: Diagnosis present

## 2017-04-20 DIAGNOSIS — E876 Hypokalemia: Secondary | ICD-10-CM | POA: Diagnosis present

## 2017-04-20 DIAGNOSIS — Z9221 Personal history of antineoplastic chemotherapy: Secondary | ICD-10-CM

## 2017-04-20 DIAGNOSIS — Z888 Allergy status to other drugs, medicaments and biological substances status: Secondary | ICD-10-CM

## 2017-04-20 DIAGNOSIS — I255 Ischemic cardiomyopathy: Secondary | ICD-10-CM | POA: Diagnosis present

## 2017-04-20 DIAGNOSIS — E784 Other hyperlipidemia: Secondary | ICD-10-CM

## 2017-04-20 DIAGNOSIS — Z85118 Personal history of other malignant neoplasm of bronchus and lung: Secondary | ICD-10-CM

## 2017-04-20 DIAGNOSIS — D649 Anemia, unspecified: Secondary | ICD-10-CM

## 2017-04-20 DIAGNOSIS — I509 Heart failure, unspecified: Secondary | ICD-10-CM | POA: Diagnosis not present

## 2017-04-20 DIAGNOSIS — E872 Acidosis: Secondary | ICD-10-CM | POA: Diagnosis present

## 2017-04-20 DIAGNOSIS — Z66 Do not resuscitate: Secondary | ICD-10-CM | POA: Diagnosis not present

## 2017-04-20 DIAGNOSIS — K210 Gastro-esophageal reflux disease with esophagitis: Secondary | ICD-10-CM | POA: Diagnosis present

## 2017-04-20 DIAGNOSIS — R600 Localized edema: Secondary | ICD-10-CM | POA: Diagnosis not present

## 2017-04-20 HISTORY — DX: Acute embolism and thrombosis of unspecified deep veins of unspecified lower extremity: I82.409

## 2017-04-20 LAB — URINALYSIS, ROUTINE W REFLEX MICROSCOPIC
Bilirubin Urine: NEGATIVE
Glucose, UA: NEGATIVE mg/dL
Ketones, ur: NEGATIVE mg/dL
Nitrite: NEGATIVE
Protein, ur: NEGATIVE mg/dL
Specific Gravity, Urine: 1.02 (ref 1.005–1.030)
Squamous Epithelial / HPF: NONE SEEN
pH: 5 (ref 5.0–8.0)

## 2017-04-20 LAB — BASIC METABOLIC PANEL
ANION GAP: 8 (ref 5–15)
BUN: 23 mg/dL — ABNORMAL HIGH (ref 6–20)
CALCIUM: 8.6 mg/dL — AB (ref 8.9–10.3)
CO2: 35 mmol/L — ABNORMAL HIGH (ref 22–32)
Chloride: 96 mmol/L — ABNORMAL LOW (ref 101–111)
Creatinine, Ser: 1.34 mg/dL — ABNORMAL HIGH (ref 0.61–1.24)
GFR calc non Af Amer: 48 mL/min — ABNORMAL LOW (ref >60)
GFR, EST AFRICAN AMERICAN: 56 mL/min — AB (ref >60)
Glucose, Bld: 172 mg/dL — ABNORMAL HIGH (ref 65–99)
POTASSIUM: 3.3 mmol/L — AB (ref 3.5–5.1)
Sodium: 139 mmol/L (ref 135–145)

## 2017-04-20 LAB — CBC WITH DIFFERENTIAL/PLATELET
BASOS PCT: 0 %
Basophils Absolute: 0 10*3/uL (ref 0.0–0.1)
Eosinophils Absolute: 0.1 10*3/uL (ref 0.0–0.7)
Eosinophils Relative: 1 %
HEMATOCRIT: 34.8 % — AB (ref 39.0–52.0)
Hemoglobin: 10.5 g/dL — ABNORMAL LOW (ref 13.0–17.0)
Lymphocytes Relative: 8 %
Lymphs Abs: 0.7 10*3/uL (ref 0.7–4.0)
MCH: 27.9 pg (ref 26.0–34.0)
MCHC: 30.2 g/dL (ref 30.0–36.0)
MCV: 92.6 fL (ref 78.0–100.0)
Monocytes Absolute: 0.6 10*3/uL (ref 0.1–1.0)
Monocytes Relative: 6 %
NEUTROS ABS: 7.8 10*3/uL — AB (ref 1.7–7.7)
NEUTROS PCT: 85 %
Platelets: 204 10*3/uL (ref 150–400)
RBC: 3.76 MIL/uL — ABNORMAL LOW (ref 4.22–5.81)
RDW: 15.9 % — AB (ref 11.5–15.5)
WBC: 9.2 10*3/uL (ref 4.0–10.5)

## 2017-04-20 LAB — PROTIME-INR
INR: 1.42
PROTHROMBIN TIME: 17.5 s — AB (ref 11.4–15.2)

## 2017-04-20 LAB — TROPONIN I: Troponin I: 0.03 ng/mL (ref <0.03)

## 2017-04-20 LAB — APTT: aPTT: 33 seconds (ref 24–36)

## 2017-04-20 LAB — BRAIN NATRIURETIC PEPTIDE: B Natriuretic Peptide: 254.8 pg/mL — ABNORMAL HIGH (ref 0.0–100.0)

## 2017-04-20 MED ORDER — ASPIRIN EC 81 MG PO TBEC
81.0000 mg | DELAYED_RELEASE_TABLET | Freq: Two times a day (BID) | ORAL | Status: DC
  Administered 2017-04-20 – 2017-04-26 (×12): 81 mg via ORAL
  Filled 2017-04-20 (×12): qty 1

## 2017-04-20 MED ORDER — POTASSIUM CHLORIDE CRYS ER 10 MEQ PO TBCR: 10.0000 meq | EXTENDED_RELEASE_TABLET | Freq: Every day | ORAL | Status: DC

## 2017-04-20 MED ORDER — IOPAMIDOL (ISOVUE-370) INJECTION 76%
100.0000 mL | Freq: Once | INTRAVENOUS | Status: AC | PRN
  Administered 2017-04-20: 100 mL via INTRAVENOUS

## 2017-04-20 MED ORDER — ALFUZOSIN HCL ER 10 MG PO TB24
10.0000 mg | ORAL_TABLET | Freq: Every day | ORAL | Status: DC
  Administered 2017-04-21 – 2017-04-27 (×7): 10 mg via ORAL
  Filled 2017-04-20 (×9): qty 1

## 2017-04-20 MED ORDER — IPRATROPIUM-ALBUTEROL 0.5-2.5 (3) MG/3ML IN SOLN
3.0000 mL | RESPIRATORY_TRACT | Status: DC | PRN
  Administered 2017-04-21 – 2017-04-23 (×2): 3 mL via RESPIRATORY_TRACT
  Filled 2017-04-20 (×3): qty 3

## 2017-04-20 MED ORDER — ALBUTEROL SULFATE (2.5 MG/3ML) 0.083% IN NEBU
5.0000 mg | INHALATION_SOLUTION | Freq: Once | RESPIRATORY_TRACT | Status: AC
  Administered 2017-04-20: 5 mg via RESPIRATORY_TRACT
  Filled 2017-04-20: qty 6

## 2017-04-20 MED ORDER — FUROSEMIDE 10 MG/ML IJ SOLN
40.0000 mg | Freq: Once | INTRAMUSCULAR | Status: AC
  Administered 2017-04-20: 40 mg via INTRAVENOUS
  Filled 2017-04-20: qty 4

## 2017-04-20 MED ORDER — FUROSEMIDE 20 MG PO TABS: 40.0000 mg | ORAL_TABLET | Freq: Two times a day (BID) | ORAL | Status: DC

## 2017-04-20 MED ORDER — PANTOPRAZOLE SODIUM 40 MG PO TBEC
40.0000 mg | DELAYED_RELEASE_TABLET | Freq: Every day | ORAL | Status: DC
  Administered 2017-04-21 – 2017-04-27 (×7): 40 mg via ORAL
  Filled 2017-04-20 (×7): qty 1

## 2017-04-20 MED ORDER — ACETAMINOPHEN 325 MG PO TABS
650.0000 mg | ORAL_TABLET | Freq: Four times a day (QID) | ORAL | Status: DC | PRN
  Administered 2017-04-23: 650 mg via ORAL
  Filled 2017-04-20: qty 2

## 2017-04-20 MED ORDER — LEVOTHYROXINE SODIUM 112 MCG PO TABS
112.0000 ug | ORAL_TABLET | Freq: Every day | ORAL | Status: DC
  Administered 2017-04-21 – 2017-04-27 (×7): 112 ug via ORAL
  Filled 2017-04-20 (×8): qty 1

## 2017-04-20 MED ORDER — ONDANSETRON HCL 4 MG PO TABS: 4.0000 mg | ORAL_TABLET | Freq: Four times a day (QID) | ORAL | Status: DC | PRN

## 2017-04-20 MED ORDER — PRAVASTATIN SODIUM 40 MG PO TABS
40.0000 mg | ORAL_TABLET | Freq: Every day | ORAL | Status: DC
  Administered 2017-04-21 – 2017-04-27 (×7): 40 mg via ORAL
  Filled 2017-04-20 (×7): qty 1

## 2017-04-20 MED ORDER — DIPHENHYDRAMINE HCL 25 MG PO CAPS
25.0000 mg | ORAL_CAPSULE | Freq: Every evening | ORAL | Status: DC | PRN
  Filled 2017-04-20: qty 1

## 2017-04-20 MED ORDER — ONDANSETRON HCL 4 MG/2ML IJ SOLN
4.0000 mg | Freq: Four times a day (QID) | INTRAMUSCULAR | Status: DC | PRN
Start: 2017-04-20 — End: 2017-04-24

## 2017-04-20 MED ORDER — METOPROLOL TARTRATE 12.5 MG HALF TABLET
12.5000 mg | ORAL_TABLET | Freq: Two times a day (BID) | ORAL | Status: DC
  Administered 2017-04-20 – 2017-04-23 (×5): 12.5 mg via ORAL
  Filled 2017-04-20 (×5): qty 1

## 2017-04-20 MED ORDER — ACETAMINOPHEN 650 MG RE SUPP: 650.0000 mg | Freq: Four times a day (QID) | RECTAL | Status: DC | PRN

## 2017-04-20 MED ORDER — GUAIFENESIN ER 600 MG PO TB12
600.0000 mg | ORAL_TABLET | Freq: Two times a day (BID) | ORAL | Status: DC
  Administered 2017-04-20 – 2017-04-24 (×8): 600 mg via ORAL
  Filled 2017-04-20 (×8): qty 1

## 2017-04-20 MED ORDER — TRAMADOL HCL 50 MG PO TABS
50.0000 mg | ORAL_TABLET | Freq: Four times a day (QID) | ORAL | Status: DC | PRN
  Filled 2017-04-20: qty 1

## 2017-04-20 MED ORDER — AMIODARONE HCL 200 MG PO TABS
200.0000 mg | ORAL_TABLET | Freq: Every day | ORAL | Status: DC
  Administered 2017-04-21 – 2017-04-27 (×7): 200 mg via ORAL
  Filled 2017-04-20 (×7): qty 1

## 2017-04-20 MED ORDER — HEPARIN (PORCINE) IN NACL 100-0.45 UNIT/ML-% IJ SOLN
1250.0000 [IU]/h | INTRAMUSCULAR | Status: DC
  Administered 2017-04-20: 1100 [IU]/h via INTRAVENOUS
  Administered 2017-04-21: 1200 [IU]/h via INTRAVENOUS
  Administered 2017-04-22: 1400 [IU]/h via INTRAVENOUS
  Administered 2017-04-23 – 2017-04-25 (×3): 1350 [IU]/h via INTRAVENOUS
  Filled 2017-04-20 (×6): qty 250

## 2017-04-20 MED ORDER — FINASTERIDE 5 MG PO TABS
5.0000 mg | ORAL_TABLET | Freq: Every day | ORAL | Status: DC
  Administered 2017-04-20 – 2017-04-27 (×8): 5 mg via ORAL
  Filled 2017-04-20 (×8): qty 1

## 2017-04-20 MED ORDER — FUROSEMIDE 10 MG/ML IJ SOLN
40.0000 mg | Freq: Two times a day (BID) | INTRAMUSCULAR | Status: DC
  Administered 2017-04-21: 40 mg via INTRAVENOUS
  Filled 2017-04-20: qty 4

## 2017-04-20 MED ORDER — HEPARIN BOLUS VIA INFUSION
5000.0000 [IU] | Freq: Once | INTRAVENOUS | Status: AC
  Administered 2017-04-20: 5000 [IU] via INTRAVENOUS
  Filled 2017-04-20: qty 5000

## 2017-04-20 NOTE — Progress Notes (Signed)
*  PRELIMINARY RESULTS* Vascular Ultrasound Left upper extremity venous duplex has been completed.  Preliminary findings: DVT noted in the left subclavian and axillary veins.  Gave results to patient's nurse.   Landry Mellow, RDMS, RVT  04/09/2017, 5:48 PM

## 2017-04-20 NOTE — Progress Notes (Signed)
ANTICOAGULATION CONSULT NOTE - Initial Consult  Pharmacy Consult for heparin Indication: DVT and r/o PE  Allergies  Allergen Reactions  . Atorvastatin Other (See Comments)    joint aches    Patient Measurements: Height: '5\' 10"'$  (177.8 cm) Weight: 204 lb (92.5 kg) IBW/kg (Calculated) : 73 Heparin Dosing Weight: 91.6kg  Vital Signs: Temp: 98.2 F (36.8 C) (05/21 1515) Temp Source: Oral (05/21 1515) BP: 107/65 (05/21 1730) Pulse Rate: 83 (05/21 1800)  Labs:  Recent Labs  04/08/2017 1612  HGB 10.5*  HCT 34.8*  PLT 204  APTT 33  LABPROT 17.5*  INR 1.42  CREATININE 1.34*  TROPONINI 0.03*    Estimated Creatinine Clearance: 50.2 mL/min (A) (by C-G formula based on SCr of 1.34 mg/dL (H)).   Medical History: Past Medical History:  Diagnosis Date  . Aortic stenosis   . Barrett's esophagus   . Cancer of right lung ([place])    lower lobes  . Chronic systolic dysfunction of left ventricle   . Cirrhosis of liver ([facility])    w/o mention of alcohol  . Diverticulosis of colon   . GERD (gastroesophageal reflux disease)   . Hemorrhoids, internal   . Hyperlipidemia   . Hypertension   . Hypothyroidism   . Ischemic cardiomyopathy   . Paroxysmal atrial fibrillation ([place]) 12/14/2013   a. s/p [place]   . Rheumatic fever   . Seborrheic dermatitis   . Sinus node dysfunction ([facility])   . Type II diabetes mellitus ([place])    "weighed over 300#; lost weight; lost diabetes" (01/31/2016)    Medications:  Infusions:  . heparin      Assessment: 44 yof s/p recent CABG presented to the ED with SOB and leg pain. Found to have a DVT and checking CT scan for possible PE. To start IV heparin. Baseline H/H is lightly low but platelets are WNL. He is not on anticoagulation PTA.   Goal of Therapy:  Heparin level 0.3-0.7 units/ml Monitor platelets by anticoagulation protocol: Yes   Plan:  Heparin bolus 5000 units IV x 1 Heparin gtt 1100 units/hr - recently therapeutic on this rate Check an 8  hr heparin level Daily heparin level and CBC  Steven Everett, Rande Lawman 04/26/2017,6:40 PM

## 2017-04-20 NOTE — ED Notes (Signed)
Patient transported to CT 

## 2017-04-20 NOTE — ED Notes (Signed)
Pt. returned from XR. 

## 2017-04-20 NOTE — ED Notes (Signed)
Patient transported to X-ray 

## 2017-04-20 NOTE — ED Provider Notes (Addendum)
[facility] DEPT Provider Note   CSN: [id number] Arrival date & time: 04/19/2017  1446     History   Chief Complaint Chief Complaint  Patient presents with  . Shortness of Breath  . Leg Swelling    HPI Steven Everett is a 82 y.o. male.  HPI 82 y/o male with hx of CHF, PAF, CAD s/p CABG 03/2017, Lung CA s/p resection of the affected lobe, in remission, mild Aortic Stenosis, HTN, Hyperlipidemia, GERD/Barrett's esophagus, DM controlled with diet, and Hypothyroidism. Pt reports that he feels shot of breath today. He had PT 2 days ago, and he was having shortness of breath then, but today even simple movement within his bed gets him short of breath. Pt has no chest pain. Pt has orthopnea, which is not worse and he denies any PND like symptoms. Pt does indicate new wheezing, leg swelling and L arm swelling. The L arm swelling is new over the past few days. Pt has no hx of PE, DVT and he is not on anticoagulation for AF. Pt admits to some cough and wheezing - although there is no hx of primary lung dz like copd or asthma.  Past Medical History:  Diagnosis Date  . Aortic stenosis   . Barrett's esophagus   . Cancer of right lung ([place])    lower lobes  . Chronic systolic dysfunction of left ventricle   . Cirrhosis of liver (HCC)    w/o mention of alcohol  . Diverticulosis of colon   . GERD (gastroesophageal reflux disease)   . Hemorrhoids, internal   . Hyperlipidemia   . Hypertension   . Hypothyroidism   . Ischemic cardiomyopathy   . Paroxysmal atrial fibrillation ([place]) 12/14/2013   a. s/p [facility]   . Rheumatic fever   . Seborrheic dermatitis   . Sinus node dysfunction (HCC)   . Type II diabetes mellitus ([place])    "weighed over 300#; lost weight; lost diabetes" (01/31/2016)    Patient Active Problem List   Diagnosis Date Noted  . Coronary artery disease 04/01/2017  . NSTEMI (non-ST elevated myocardial infarction) ([place]) 03/30/2017  . Persistent atrial fibrillation ([place])  01/31/2016  . IFG (impaired fasting glucose) 05/15/2015  . CKD (chronic kidney disease) 05/15/2015  . ICD (implantable cardioverter-defibrillator) in place 01/10/2015  . Atrial fibrillation ([place]) 12/14/2013  . Sick sinus syndrome ([place]) 12/14/2013  . Chronic systolic heart failure ([place]) 08/12/2013  . Symptomatic bradycardia 08/12/2013  . Hyperlipidemia 07/20/2013  . CAD (coronary artery disease) 04/06/2013  . Congestive dilated cardiomyopathy ([place]) 01/26/2013  . Aortic stenosis 01/26/2013  . SHOULDER PAIN, RIGHT 02/03/2011  . Benign prostatic hypertrophy with urinary obstruction 07/20/2008  . ADENOMATOUS COLONIC POLYP 01/24/2008  . HEMORRHOIDS, INTERNAL 01/11/2008  . DIVERTICULOSIS, COLON 01/11/2008  . Hepatic cirrhosis ([place]) 11/16/2007  . History of lung cancer 10/06/2007    Class: History of  . SEBORRHEIC DERMATITIS 10/04/2007  . BARRETTS ESOPHAGUS 01/01/2007  . Hypothyroidism 12/22/2006  . GERD 12/22/2006    Past Surgical History:  Procedure Laterality Date  . ANKLE FRACTURE SURGERY Right 1940s  . APPENDECTOMY  1940s  . CARDIAC CATHETERIZATION  "years ago"  . CARDIAC DEFIBRILLATOR PLACEMENT  08/12/13   Medtronic Evera XT DR implanted by [name] for primary prevention of sudden death  . CARDIAC PACEMAKER PLACEMENT    . CATARACT EXTRACTION, BILATERAL Bilateral 8-11   Dr Ayesha Rumpf  . COLONOSCOPY    . CORONARY ARTERY BYPASS GRAFT N/A 04/01/2017   Procedure: CORONARY ARTERY BYPASS GRAFTING (CABG)  x 2 (LIMA to DIAGONAL and SVG to RCA) WITH ENDOSCOPIC HARVESTING OF RIGHT SAPHENOUS VEIN;  Surgeon: Grace Isaac, MD;  Location: [facility];  Service: Open Heart Surgery;  Laterality: N/A;  . FRACTURE SURGERY    . IMPLANTABLE CARDIOVERTER DEFIBRILLATOR IMPLANT N/A 08/12/2013   Procedure: IMPLANTABLE CARDIOVERTER DEFIBRILLATOR IMPLANT;  Surgeon: Coralyn Mark, MD;  Location: [facility] CATH LAB;  Service: Cardiovascular;  Laterality: N/A;  . JOINT REPLACEMENT    . LEFT ATRIAL APPENDAGE OCCLUSION  N/A 01/31/2016   Procedure: LEFT ATRIAL APPENDAGE OCCLUSION;  Surgeon: Thompson Grayer, MD;  Location: [facility] CV LAB;  Service: Cardiovascular;  Laterality: N/A;  . LUMBAR DISC SURGERY  X 2   "herniated discs"  . LUNG REMOVAL, PARTIAL Right 1998   for lung cancer by Dr Rollene Fare; middle and lower lobes  . RIGHT/LEFT HEART CATH AND CORONARY ANGIOGRAPHY N/A 03/31/2017   Procedure: Right/Left Heart Cath and Coronary Angiography;  Surgeon: Belva Crome, MD;  Location: [facility];  Service: Cardiovascular;  Laterality: N/A;  . TEE WITHOUT CARDIOVERSION N/A 02/14/2013   Procedure: TRANSESOPHAGEAL ECHOCARDIOGRAM (TEE);  Surgeon: Lelon Perla, MD;  Location: [facility] ENDOSCOPY;  Service: Cardiovascular;  Laterality: N/A;  . TEE WITHOUT CARDIOVERSION N/A 01/09/2016   Procedure: TRANSESOPHAGEAL ECHOCARDIOGRAM (TEE);  Surgeon: Josue Hector, MD;  Location: [facility] ENDOSCOPY;  Service: Cardiovascular;  Laterality: N/A;  . TEE WITHOUT CARDIOVERSION N/A 03/20/2016   Procedure: TRANSESOPHAGEAL ECHOCARDIOGRAM (TEE);  Surgeon: Lelon Perla, MD;  Location: [facility] ENDOSCOPY;  Service: Cardiovascular;  Laterality: N/A;  . TEE WITHOUT CARDIOVERSION N/A 04/01/2017   Procedure: TRANSESOPHAGEAL ECHOCARDIOGRAM (TEE);  Surgeon: Grace Isaac, MD;  Location: [facility];  Service: Open Heart Surgery;  Laterality: N/A;  . TOTAL HIP ARTHROPLASTY Right    Dr Alvan Dame       Home Medications    Prior to Admission medications   Medication Sig Start Date End Date Taking? Authorizing Provider  alfuzosin (UROXATRAL) 10 MG 24 hr tablet Take 10 mg by mouth daily with breakfast.   Yes [provider]  amiodarone (PACERONE) 200 MG tablet Take 1 tablet (200 mg total) by mouth daily. 04/13/17  Yes Conte, Tessa N, PA-C  aspirin 81 MG tablet TAKE 1 TABLET  BY MOUTH IN THE MORNING AND 1 TABLET BY MOUTH AT NIGHT.   Yes [provider]  diphenhydrAMINE (BENADRYL) 25 mg capsule Take 1 capsule (25 mg total) by mouth at bedtime as  needed for sleep. 04/09/17  Yes Conte, Tessa N, PA-C  docusate sodium (COLACE) 100 MG capsule Take 2 capsules (200 mg total) by mouth daily. 04/10/17  Yes Conte, Tessa N, PA-C  finasteride (PROSCAR) 5 MG tablet Take 5 mg by mouth daily.   Yes [provider]  furosemide (LASIX) 20 MG tablet Take 1 tablet (20 mg total) by mouth daily. Patient taking differently: Take 40 mg by mouth 2 (two) times daily.  04/09/17  Yes Conte, Tessa N, PA-C  ipratropium-albuterol (DUONEB) 0.5-2.5 (3) MG/3ML SOLN Take 3 mLs by nebulization every 6 (six) hours. Patient taking differently: Take 3 mLs by nebulization every 6 (six) hours as needed (wheezing or SOB).  04/09/17  Yes Harriet Pho, Tessa N, PA-C  levothyroxine (SYNTHROID, LEVOTHROID) 112 MCG tablet Take 1 tablet (112 mcg total) by mouth daily before breakfast. 03/24/17  Yes Hali Marry, MD  metoprolol tartrate (LOPRESSOR) 25 MG tablet Take 0.5 tablets (12.5 mg total) by mouth 2 (two) times daily. 04/09/17  Yes Elgie Collard,  PA-C  omeprazole (PRILOSEC) 20 MG capsule Take 20 mg by mouth daily.   Yes [provider]  OXYGEN Inhale 2 L into the lungs continuous.   Yes [provider]  potassium chloride (K-DUR) 10 MEQ tablet Take 1 tablet (10 mEq total) by mouth daily. 04/09/17  Yes Conte, Tessa N, PA-C  pravastatin (PRAVACHOL) 40 MG tablet Take 1 tablet (40 mg total) by mouth daily. 03/18/17  Yes Lelon Perla, MD  traMADol (ULTRAM) 50 MG tablet Take 1 tablet (50 mg total) by mouth every 6 (six) hours as needed for moderate pain. 04/09/17  Yes Harriet Pho, Tessa N, PA-C  pantoprazole (PROTONIX) 40 MG tablet Take 1 tablet (40 mg total) by mouth daily before breakfast. Patient not taking: Reported on 04/26/2017 04/10/17   Elgie Collard, PA-C    Family History Family History  Problem Relation Age of Onset  . Cancer Mother        breast  . Diabetes Mother   . Cancer Father        Lung  . Diabetes Unknown     Social History Social  History  Substance Use Topics  . Smoking status: Former Smoker    Packs/day: 2.00    Years: 36.00    Types: Cigarettes    Quit date: 02/14/1989  . Smokeless tobacco: Never Used  . Alcohol use No     Allergies   Atorvastatin   Review of Systems Review of Systems  Constitutional: Positive for activity change. Negative for diaphoresis and fever.  HENT: Negative for congestion.   Respiratory: Positive for cough, shortness of breath and wheezing. Negative for chest tightness.   Cardiovascular: Negative for chest pain.  Gastrointestinal: Negative for abdominal distention.  Genitourinary: Negative for difficulty urinating, dysuria and enuresis.  Musculoskeletal: Negative for neck pain.  Skin: Positive for rash.  Allergic/Immunologic: Negative for immunocompromised state.  Neurological: Negative for dizziness, syncope, light-headedness and headaches.  Hematological: Does not bruise/bleed easily.  Psychiatric/Behavioral: Negative for confusion.     Physical Exam Updated Vital Signs BP (!) 106/59 (BP Location: Left Arm)   Pulse 86   Temp 98.2 F (36.8 C) (Oral)   Resp (!) 25   Ht '5\' 10"'$  (1.778 m)   Wt 92.5 kg (204 lb)   SpO2 97%   BMI 29.27 kg/m   Physical Exam  Constitutional: He is oriented to person, place, and time. He appears well-developed.  HENT:  Head: Normocephalic and atraumatic.  Eyes: Conjunctivae and EOM are normal.  Neck: Normal range of motion. Neck supple. JVD present.  Cardiovascular: Normal rate and regular rhythm.   Murmur heard. Pulmonary/Chest: He is in respiratory distress. He has wheezes. He has rales.  Right worse than Left wheezing and tachypnea  Abdominal: Soft. Bowel sounds are normal. He exhibits no distension. There is no tenderness. There is no rebound and no guarding.  Musculoskeletal: He exhibits edema.  2+ in bilateral lower extremity. Pt has edematous LUE as well, no pitting. There is erythema, but no warmth to touch.  Neurological:  He is alert and oriented to person, place, and time.  Skin: Skin is warm.  Nursing note and vitals reviewed.    ED Treatments / Results  Labs (all labs ordered are listed, but only abnormal results are displayed) Labs Reviewed  BASIC METABOLIC PANEL - Abnormal; Notable for the following:       Result Value   Potassium 3.3 (*)    Chloride 96 (*)    CO2 35 (*)  Glucose, Bld 172 (*)    BUN 23 (*)    Creatinine, Ser 1.34 (*)    Calcium 8.6 (*)    GFR calc non Af Amer 48 (*)    GFR calc Af Amer 56 (*)    All other components within normal limits  CBC WITH DIFFERENTIAL/PLATELET - Abnormal; Notable for the following:    RBC 3.76 (*)    Hemoglobin 10.5 (*)    HCT 34.8 (*)    RDW 15.9 (*)    Neutro Abs 7.8 (*)    All other components within normal limits  TROPONIN I - Abnormal; Notable for the following:    Troponin I 0.03 (*)    All other components within normal limits  BRAIN NATRIURETIC PEPTIDE - Abnormal; Notable for the following:    B Natriuretic Peptide 254.8 (*)    All other components within normal limits  PROTIME-INR - Abnormal; Notable for the following:    Prothrombin Time 17.5 (*)    All other components within normal limits  APTT    EKG  EKG Interpretation  Date/Time:  Monday Apr 20 2017 15:01:25 EDT Ventricular Rate:  91 PR Interval:    QRS Duration: 174 QT Interval:  514 QTC Calculation: 632 R Axis:   -118 Text Interpretation:  Atrial fibrillation Right bundle branch block Abnormal ECG Nonspecific ST and T wave abnormality No acute changes Confirmed by Varney Biles ([id number]) on 04/26/2017 3:15:02 PM       Radiology Dg Chest 2 View  Result Date: 04/26/2017 CLINICAL DATA:  Short of breath.  Lung cancer EXAM: CHEST  2 VIEW COMPARISON:  04/06/2017 FINDINGS: CABG changes. Internal cardiac defibrillator unchanged. Moderate bilateral effusions with bibasilar atelectasis unchanged. Negative for edema. Small right apical pneumothorax unchanged. PICC line  removed since the prior study. Apical scarring bilaterally. IMPRESSION: Bilateral pleural effusions and bibasilar atelectasis. No change from the prior study. Negative for edema Probable small right apical pneumothorax unchanged. Electronically Signed   By: Franchot Gallo M.D.   On: 04/11/2017 17:00    Procedures Procedures (including critical care time)   CRITICAL CARE Performed by: Varney Biles   Total critical care time: 58 minutes - respiratory distress, due to thromboembolism  Critical care time was exclusive of separately billable procedures and treating other patients.  Critical care was necessary to treat or prevent imminent or life-threatening deterioration.  Critical care was time spent personally by me on the following activities: development of treatment plan with patient and/or surrogate as well as nursing, discussions with consultants, evaluation of patient's response to treatment, examination of patient, obtaining history from patient or surrogate, ordering and performing treatments and interventions, ordering and review of laboratory studies, ordering and review of radiographic studies, pulse oximetry and re-evaluation of patient's condition.   Medications Ordered in ED Medications  furosemide (LASIX) injection 40 mg (not administered)  albuterol (PROVENTIL) (2.5 MG/3ML) 0.083% nebulizer solution 5 mg (5 mg Nebulization Given 04/12/2017 1537)     Initial Impression / Assessment and Plan / ED Course  I have reviewed the triage vital signs and the nursing notes.  Pertinent labs & imaging results that were available during my care of the patient were reviewed by me and considered in my medical decision making (see chart for details).  Clinical Course as of Apr 20 1832  Mon Apr 20, 2017  1831 CXR mostly unchanged. Maybe some more pleural effusion on the L side. DVT study is +. Heparin ordered. At this time pt is presumed  to have PE. He is noted to be breathing 26  times while resting, I think a CT PE will be prudent to r/o PE and if there is PE to know the clot burden. DG Chest 2 View [AN]  [id number] Results from the ER workup discussed with the patient face to face and all questions answered to the best of my ability.   [AN]    Clinical Course User Index [AN] Varney Biles, MD    Pt comes in with cc of dib. Pt is noted to be in mild respiratory distress w/o hypoxia. Pt is having wheezing. No chest pain.  The dyspnea differential is infection vs. PE vs. CHF exacerbation. With the pitting edema in the legs and unilateral L sided swelling, we are concerned about PE and CHF primarily based on the exam alone. Add to that clinical findings with hx of recent surgery and known hx of CHF and no fevers, cough - infection is certainly the lowest in the differential.  Labs and imaging ordered. [place] upper extremity ordered.   Final Clinical Impressions(s) / ED Diagnoses   Final diagnoses:  Dyspnea  Respiratory distress, acute  Acute deep vein thrombosis (DVT) of axillary vein of left upper extremity [facility])    New Prescriptions New Prescriptions   No medications on file     Varney Biles, MD 04/01/2017 [place], [place], MD 04/23/2017 [phone]

## 2017-04-20 NOTE — ED Notes (Signed)
Pt was able to stand with 2 assist when using bedside toilet.

## 2017-04-20 NOTE — ED Triage Notes (Signed)
Pt arrives EMS from [facility] rehab in [place] where he was after CABG 3 weeks ago. Here today for increased edema to bilat lower extremities and left arm resistent to increased lasix and SHOB. HX of pacer/defib, lung cancer.

## 2017-04-20 NOTE — H&P (Signed)
History and Physical    Steven Everett [id number]:[id number] DOB: 11-25-1935 DOA: 04/23/2017  Referring MD/NP/PA: Dr. Kathrynn Humble PCP: Hali Marry, MD  Patient coming from: [facility] and rehab via EMS  Chief Complaint: Left upper extremity swelling  HPI: Steven Everett is a 82 y.o. male with medical history significant of HTN, HLD, CAD s/p CABG, CHF LVEF 35-40%, moderate AS, PAF s/p ICD and watchman, cirrhosis, DM type 2, hypothyroidism, and Lung Ca; who presents with complaints of acute left upper extremity swelling. Patient reports acute onset of left arm swelling early this morning. He had just been discharged from the hospital to rehabilitation facility on 5/9 after being found to have an NSTEMI undergoing CABG 2 by Dr. Servando Snare on 5/2. During that hospitalization Lovenox was held due to mild pancytopenia. Patient notes that he's progressively had worsening lower extremity swelling as well as more labored breathing over the last few days. The rehabilitation facility had wrapped his lower legs as his feet had begun to blister and weep fluid. Normally he is not on oxygen, but he has been on it for last 2 days at the rehabilitation facility he has his oxygen sats had been dropping with physical therapy. Associated symptoms include wheezing(new), nonproductive cough, and orthopnea which patient reports not necessarily being new. He denies having any significant chest pain, nausea, vomiting, or loss of consciousness. He is not on any anticoagulation though he's had a previous history of atrial fibrillation.    ED Course: Upon admission to the emergency department patient was seen to be afebrile, heart rate 68-119, respirations 20-37, and all other vitals maintained. Labs revealed hemoglobin 10.5, potassium 3.3, BUN 23, creatinine 1.34, troponin 0.03, BNP 254.8. Chest x-ray showing bilateral pleural effusions similar to previous study 5/7 with probable small apical pneumothorax. Doppler ultrasound  of the left upper extremity show probable DVT of the left subclavian and axillary veins. Patient was given albuterol nebulizer, 40 mg of Lasix IV, and started on heparin drip.   Review of Systems: As per HPI otherwise 10 point review of systems negative.   Past Medical History:  Diagnosis Date  . Aortic stenosis   . Barrett's esophagus   . Cancer of right lung ([place])    lower lobes  . Chronic systolic dysfunction of left ventricle   . Cirrhosis of liver (HCC)    w/o mention of alcohol  . Diverticulosis of colon   . GERD (gastroesophageal reflux disease)   . Hemorrhoids, internal   . Hyperlipidemia   . Hypertension   . Hypothyroidism   . Ischemic cardiomyopathy   . Paroxysmal atrial fibrillation ([place]) 12/14/2013   a. s/p Watchman   . Rheumatic fever   . Seborrheic dermatitis   . Sinus node dysfunction (HCC)   . Type II diabetes mellitus ([place])    "weighed over 300#; lost weight; lost diabetes" (01/31/2016)    Past Surgical History:  Procedure Laterality Date  . ANKLE FRACTURE SURGERY Right 1940s  . APPENDECTOMY  1940s  . CARDIAC CATHETERIZATION  "years ago"  . CARDIAC DEFIBRILLATOR PLACEMENT  08/12/13   Medtronic Evera XT DR implanted by [name] for primary prevention of sudden death  . CARDIAC PACEMAKER PLACEMENT    . CATARACT EXTRACTION, BILATERAL Bilateral 8-11   Dr Ayesha Rumpf  . COLONOSCOPY    . CORONARY ARTERY BYPASS GRAFT N/A 04/01/2017   Procedure: CORONARY ARTERY BYPASS GRAFTING (CABG) x 2 (LIMA to DIAGONAL and SVG to RCA) WITH ENDOSCOPIC HARVESTING OF RIGHT SAPHENOUS VEIN;  Surgeon:  Grace Isaac, MD;  Location: [facility];  Service: Open Heart Surgery;  Laterality: N/A;  . FRACTURE SURGERY    . IMPLANTABLE CARDIOVERTER DEFIBRILLATOR IMPLANT N/A 08/12/2013   Procedure: IMPLANTABLE CARDIOVERTER DEFIBRILLATOR IMPLANT;  Surgeon: Coralyn Mark, MD;  Location: [facility] CATH LAB;  Service: Cardiovascular;  Laterality: N/A;  . JOINT REPLACEMENT    . LEFT ATRIAL APPENDAGE OCCLUSION N/A  01/31/2016   Procedure: LEFT ATRIAL APPENDAGE OCCLUSION;  Surgeon: Thompson Grayer, MD;  Location: [place] CV LAB;  Service: Cardiovascular;  Laterality: N/A;  . LUMBAR DISC SURGERY  X 2   "herniated discs"  . LUNG REMOVAL, PARTIAL Right 1998   for lung cancer by Dr Rollene Fare; middle and lower lobes  . RIGHT/LEFT HEART CATH AND CORONARY ANGIOGRAPHY N/A 03/31/2017   Procedure: Right/Left Heart Cath and Coronary Angiography;  Surgeon: Belva Crome, MD;  Location: [place] CV LAB;  Service: Cardiovascular;  Laterality: N/A;  . TEE WITHOUT CARDIOVERSION N/A 02/14/2013   Procedure: TRANSESOPHAGEAL ECHOCARDIOGRAM (TEE);  Surgeon: Lelon Perla, MD;  Location: [facility] ENDOSCOPY;  Service: Cardiovascular;  Laterality: N/A;  . TEE WITHOUT CARDIOVERSION N/A 01/09/2016   Procedure: TRANSESOPHAGEAL ECHOCARDIOGRAM (TEE);  Surgeon: Josue Hector, MD;  Location: [facility] ENDOSCOPY;  Service: Cardiovascular;  Laterality: N/A;  . TEE WITHOUT CARDIOVERSION N/A 03/20/2016   Procedure: TRANSESOPHAGEAL ECHOCARDIOGRAM (TEE);  Surgeon: Lelon Perla, MD;  Location: [facility] ENDOSCOPY;  Service: Cardiovascular;  Laterality: N/A;  . TEE WITHOUT CARDIOVERSION N/A 04/01/2017   Procedure: TRANSESOPHAGEAL ECHOCARDIOGRAM (TEE);  Surgeon: Grace Isaac, MD;  Location: [place];  Service: Open Heart Surgery;  Laterality: N/A;  . TOTAL HIP ARTHROPLASTY Right    Dr Alvan Dame     reports that he quit smoking about 28 years ago. His smoking use included Cigarettes. He has a 72.00 pack-year smoking history. He has never used smokeless tobacco. He reports that he does not drink alcohol or use drugs.  Allergies  Allergen Reactions  . Atorvastatin Other (See Comments)    joint aches    Family History  Problem Relation Age of Onset  . Cancer Mother        breast  . Diabetes Mother   . Cancer Father        Lung  . Diabetes Unknown     Prior to Admission medications   Medication Sig Start Date End Date Taking? Authorizing Provider    alfuzosin (UROXATRAL) 10 MG 24 hr tablet Take 10 mg by mouth daily with breakfast.   Yes [provider]  amiodarone (PACERONE) 200 MG tablet Take 1 tablet (200 mg total) by mouth daily. 04/13/17  Yes Conte, Tessa N, PA-C  aspirin 81 MG tablet TAKE 1 TABLET  BY MOUTH IN THE MORNING AND 1 TABLET BY MOUTH AT NIGHT.   Yes [provider]  diphenhydrAMINE (BENADRYL) 25 mg capsule Take 1 capsule (25 mg total) by mouth at bedtime as needed for sleep. 04/09/17  Yes Conte, Tessa N, PA-C  docusate sodium (COLACE) 100 MG capsule Take 2 capsules (200 mg total) by mouth daily. 04/10/17  Yes Conte, Tessa N, PA-C  finasteride (PROSCAR) 5 MG tablet Take 5 mg by mouth daily.   Yes [provider]  furosemide (LASIX) 20 MG tablet Take 1 tablet (20 mg total) by mouth daily. Patient taking differently: Take 40 mg by mouth 2 (two) times daily.  04/09/17  Yes Conte, Tessa N, PA-C  ipratropium-albuterol (DUONEB) 0.5-2.5 (3) MG/3ML SOLN Take 3 mLs by nebulization every  6 (six) hours. Patient taking differently: Take 3 mLs by nebulization every 6 (six) hours as needed (wheezing or SOB).  04/09/17  Yes Harriet Pho, Tessa N, PA-C  levothyroxine (SYNTHROID, LEVOTHROID) 112 MCG tablet Take 1 tablet (112 mcg total) by mouth daily before breakfast. 03/24/17  Yes Hali Marry, MD  metoprolol tartrate (LOPRESSOR) 25 MG tablet Take 0.5 tablets (12.5 mg total) by mouth 2 (two) times daily. 04/09/17  Yes Conte, Tessa N, PA-C  omeprazole (PRILOSEC) 20 MG capsule Take 20 mg by mouth daily.   Yes [provider]  OXYGEN Inhale 2 L into the lungs continuous.   Yes [provider]  potassium chloride (K-DUR) 10 MEQ tablet Take 1 tablet (10 mEq total) by mouth daily. 04/09/17  Yes Conte, Tessa N, PA-C  pravastatin (PRAVACHOL) 40 MG tablet Take 1 tablet (40 mg total) by mouth daily. 03/18/17  Yes Lelon Perla, MD  traMADol (ULTRAM) 50 MG tablet Take 1 tablet (50 mg total) by mouth every 6 (six)  hours as needed for moderate pain. 04/09/17  Yes Harriet Pho, Tessa N, PA-C  pantoprazole (PROTONIX) 40 MG tablet Take 1 tablet (40 mg total) by mouth daily before breakfast. Patient not taking: Reported on 04/23/2017 04/10/17   Elgie Collard, PA-C    Physical Exam: Constitutional: Elderly male who appears acutely ill, but able to follow commands. Vitals:   04/02/2017 1715 04/25/2017 1730 04/02/2017 1800 04/29/2017 1900  BP: (!) 106/59 107/65  (!) 122/59  Pulse: 68 81 83 75  Resp: (!) 26 (!) 27 (!) 22 20  Temp:      TempSrc:      SpO2: 96% 100% 100% 98%  Weight:      Height:       Eyes: PERRL, lids and conjunctivae normal ENMT: Mucous membranes are dry. Posterior pharynx clear of any exudate or lesions. Neck: normal, supple, no masses, no thyromegaly . Positive JVD. Respiratory: Tachypneic with expiratory wheezes appreciated. Decreased breath sounds noted on the left lung field. Cardiovascular: Regular rate and rhythm, Positive systolic ejection present. No rubs / gallops. 2+ pitting lower extremity edema. 2+ pedal pulses. No carotid bruits.  Abdomen: no tenderness, no masses palpated. No hepatosplenomegaly. Bowel sounds positive.  Musculoskeletal: no clubbing / cyanosis. No joint deformity upper and lower extremities. Good ROM, no contractures. Normal muscle tone.  Skin: no rashes, lesions, ulcers. No induration Neurologic: CN 2-12 grossly intact. Sensation intact, DTR normal. Strength 5/5 in all 4.  Psychiatric: Normal judgment and insight. Alert and oriented x 3. Normal mood.     Labs on Admission: I have personally reviewed following labs and imaging studies  CBC:  Recent Labs Lab 04/21/2017 1612  WBC 9.2  NEUTROABS 7.8*  HGB 10.5*  HCT 34.8*  MCV 92.6  PLT 657   Basic Metabolic Panel:  Recent Labs Lab 04/11/2017 1612  NA 139  K 3.3*  CL 96*  CO2 35*  GLUCOSE 172*  BUN 23*  CREATININE 1.34*  CALCIUM 8.6*   GFR: Estimated Creatinine Clearance: 50.2 mL/min (A) (by C-G  formula based on SCr of 1.34 mg/dL (H)). Liver Function Tests: No results for input(s): AST, ALT, ALKPHOS, BILITOT, PROT, ALBUMIN in the last 168 hours. No results for input(s): LIPASE, AMYLASE in the last 168 hours. No results for input(s): AMMONIA in the last 168 hours. Coagulation Profile:  Recent Labs Lab 04/19/2017 1612  INR 1.42   Cardiac Enzymes:  Recent Labs Lab 04/02/2017 1612  TROPONINI 0.03*   BNP (last  3 results) No results for input(s): PROBNP in the last 8760 hours. HbA1C: No results for input(s): HGBA1C in the last 72 hours. CBG: No results for input(s): GLUCAP in the last 168 hours. Lipid Profile: No results for input(s): CHOL, HDL, LDLCALC, TRIG, CHOLHDL, LDLDIRECT in the last 72 hours. Thyroid Function Tests: No results for input(s): TSH, T4TOTAL, FREET4, T3FREE, THYROIDAB in the last 72 hours. Anemia Panel: No results for input(s): VITAMINB12, FOLATE, FERRITIN, TIBC, IRON, RETICCTPCT in the last 72 hours. Urine analysis:    Component Value Date/Time   COLORURINE AMBER (A) 06/20/2012 0950   APPEARANCEUR CLOUDY (A) 06/20/2012 0950   LABSPEC 1.025 06/20/2012 0950   PHURINE 7.0 06/20/2012 0950   GLUCOSEU NEGATIVE 06/20/2012 0950   HGBUR LARGE (A) 06/20/2012 0950   BILIRUBINUR neg 02/03/2017 1613   KETONESUR 40 (A) 06/20/2012 0950   PROTEINUR neg 02/03/2017 1613   PROTEINUR 30 (A) 06/20/2012 0950   UROBILINOGEN 4.0 02/03/2017 1613   UROBILINOGEN 1.0 06/20/2012 0950   NITRITE neg 02/03/2017 1613   NITRITE POSITIVE (A) 06/20/2012 0950   LEUKOCYTESUR Negative 02/03/2017 1613   Sepsis Labs: No results found for this or any previous visit (from the past 240 hour(s)).   Radiological Exams on Admission: Dg Chest 2 View  Result Date: 04/05/2017 CLINICAL DATA:  Short of breath.  Lung cancer EXAM: CHEST  2 VIEW COMPARISON:  04/06/2017 FINDINGS: CABG changes. Internal cardiac defibrillator unchanged. Moderate bilateral effusions with bibasilar atelectasis  unchanged. Negative for edema. Small right apical pneumothorax unchanged. PICC line removed since the prior study. Apical scarring bilaterally. IMPRESSION: Bilateral pleural effusions and bibasilar atelectasis. No change from the prior study. Negative for edema Probable small right apical pneumothorax unchanged. Electronically Signed   By: Franchot Gallo M.D.   On: 04/10/2017 17:00    EKG: Independently reviewed. Atrial fibrillation at 91 bpm  Assessment/Plan DVT of left upper extremity: Acute. Patient presents with acute left upper extremity swelling. Doppler ultrasound of the left upper extremity revealed acute DVT. Question if possible PE. - Admit to a telemetry bed - Follow-up CT angiogram of chest - Heparin per pharmacy - Discussed anticoagulation options in a.m.  Systolic CHF exacerbation with large left-sided pleural effusion: Acute. Patient presents with worsening shortness of breath and lower extremity swelling. last EF was noted to be 35-40% by TEE on 5/2.  - Strict I&Os and Daily weights - Lasix 40 mg IV BID - Consult cardiology in a.m for need of medication adjustments  Acute respiratory failure: Patient not on oxygen at home. Requiring [place] oxygen to maintain O2 sats on admission. - Continuous pulse oximetry overnight with nasal cannula oxygen keep O2 sat relation creatinine 90%  - DuoNeb's  prn SOB/Wheezing  Paroxysmal atrial fibrillation s/p ICD: Patient currently rate controlled.  Chadvasc score  - Continue amiodarone  CAD s/p CABG: Recent CABG on 5/2 - continue asa  Hypokalemia: Acute. Patient's initial potassium was noted to be 3.3 on admission. - Potassium chloride 40 mEq 1 dose now - Continue to monitor and replace as needed  Anemia: Hemoglobin on admission 10.5 , but baseline appears to be around 9-10. - Continue to monitor   Essential hypertension - Continue metoprolol  Chronic kidney disease stage III - Continue to monitor  Hyperlipidemia - Continue  atorvastatin   Hypothyroidism - Continue levothyroxine   BPH  - Continue Proscar DVT prophylaxis: Heparin Code Status: Full  Family Communication: Disposition Plan: SNF Consults called:  Admission status: Observation  Norval Morton MD [facility] Pager [phone]  If 7PM-7AM, please contact night-coverage [email or web address] Password [email or web address]  04/26/2017, 7:20 PM

## 2017-04-21 ENCOUNTER — Other Ambulatory Visit (HOSPITAL_COMMUNITY): Payer: Medicare Other

## 2017-04-21 DIAGNOSIS — Z85118 Personal history of other malignant neoplasm of bronchus and lung: Secondary | ICD-10-CM | POA: Diagnosis not present

## 2017-04-21 DIAGNOSIS — I82B12 Acute embolism and thrombosis of left subclavian vein: Secondary | ICD-10-CM | POA: Diagnosis present

## 2017-04-21 DIAGNOSIS — Z471 Aftercare following joint replacement surgery: Secondary | ICD-10-CM | POA: Diagnosis not present

## 2017-04-21 DIAGNOSIS — N189 Chronic kidney disease, unspecified: Secondary | ICD-10-CM | POA: Diagnosis not present

## 2017-04-21 DIAGNOSIS — R0600 Dyspnea, unspecified: Secondary | ICD-10-CM | POA: Diagnosis not present

## 2017-04-21 DIAGNOSIS — I130 Hypertensive heart and chronic kidney disease with heart failure and stage 1 through stage 4 chronic kidney disease, or unspecified chronic kidney disease: Secondary | ICD-10-CM | POA: Diagnosis present

## 2017-04-21 DIAGNOSIS — D631 Anemia in chronic kidney disease: Secondary | ICD-10-CM | POA: Diagnosis not present

## 2017-04-21 DIAGNOSIS — I480 Paroxysmal atrial fibrillation: Secondary | ICD-10-CM

## 2017-04-21 DIAGNOSIS — J9600 Acute respiratory failure, unspecified whether with hypoxia or hypercapnia: Secondary | ICD-10-CM | POA: Diagnosis not present

## 2017-04-21 DIAGNOSIS — I5043 Acute on chronic combined systolic (congestive) and diastolic (congestive) heart failure: Secondary | ICD-10-CM | POA: Diagnosis present

## 2017-04-21 DIAGNOSIS — J189 Pneumonia, unspecified organism: Secondary | ICD-10-CM | POA: Diagnosis not present

## 2017-04-21 DIAGNOSIS — J440 Chronic obstructive pulmonary disease with acute lower respiratory infection: Secondary | ICD-10-CM | POA: Diagnosis present

## 2017-04-21 DIAGNOSIS — E7800 Pure hypercholesterolemia, unspecified: Secondary | ICD-10-CM

## 2017-04-21 DIAGNOSIS — N183 Chronic kidney disease, stage 3 (moderate): Secondary | ICD-10-CM | POA: Diagnosis present

## 2017-04-21 DIAGNOSIS — I950 Idiopathic hypotension: Secondary | ICD-10-CM | POA: Diagnosis not present

## 2017-04-21 DIAGNOSIS — I82629 Acute embolism and thrombosis of deep veins of unspecified upper extremity: Secondary | ICD-10-CM | POA: Diagnosis not present

## 2017-04-21 DIAGNOSIS — S79911A Unspecified injury of right hip, initial encounter: Secondary | ICD-10-CM | POA: Diagnosis not present

## 2017-04-21 DIAGNOSIS — J9601 Acute respiratory failure with hypoxia: Secondary | ICD-10-CM | POA: Diagnosis not present

## 2017-04-21 DIAGNOSIS — J90 Pleural effusion, not elsewhere classified: Secondary | ICD-10-CM | POA: Diagnosis not present

## 2017-04-21 DIAGNOSIS — J948 Other specified pleural conditions: Secondary | ICD-10-CM | POA: Diagnosis not present

## 2017-04-21 DIAGNOSIS — Z951 Presence of aortocoronary bypass graft: Secondary | ICD-10-CM | POA: Diagnosis not present

## 2017-04-21 DIAGNOSIS — R0602 Shortness of breath: Secondary | ICD-10-CM | POA: Diagnosis not present

## 2017-04-21 DIAGNOSIS — E43 Unspecified severe protein-calorie malnutrition: Secondary | ICD-10-CM | POA: Diagnosis present

## 2017-04-21 DIAGNOSIS — Z95810 Presence of automatic (implantable) cardiac defibrillator: Secondary | ICD-10-CM | POA: Diagnosis not present

## 2017-04-21 DIAGNOSIS — I214 Non-ST elevation (NSTEMI) myocardial infarction: Secondary | ICD-10-CM | POA: Diagnosis present

## 2017-04-21 DIAGNOSIS — I420 Dilated cardiomyopathy: Secondary | ICD-10-CM | POA: Diagnosis present

## 2017-04-21 DIAGNOSIS — E876 Hypokalemia: Secondary | ICD-10-CM | POA: Diagnosis not present

## 2017-04-21 DIAGNOSIS — E872 Acidosis: Secondary | ICD-10-CM | POA: Diagnosis present

## 2017-04-21 DIAGNOSIS — I82A12 Acute embolism and thrombosis of left axillary vein: Secondary | ICD-10-CM | POA: Diagnosis not present

## 2017-04-21 DIAGNOSIS — I25810 Atherosclerosis of coronary artery bypass graft(s) without angina pectoris: Secondary | ICD-10-CM | POA: Diagnosis not present

## 2017-04-21 DIAGNOSIS — I2510 Atherosclerotic heart disease of native coronary artery without angina pectoris: Secondary | ICD-10-CM

## 2017-04-21 DIAGNOSIS — Y95 Nosocomial condition: Secondary | ICD-10-CM | POA: Diagnosis present

## 2017-04-21 DIAGNOSIS — I5023 Acute on chronic systolic (congestive) heart failure: Secondary | ICD-10-CM | POA: Diagnosis not present

## 2017-04-21 DIAGNOSIS — Z96641 Presence of right artificial hip joint: Secondary | ICD-10-CM | POA: Diagnosis not present

## 2017-04-21 DIAGNOSIS — I481 Persistent atrial fibrillation: Secondary | ICD-10-CM | POA: Diagnosis present

## 2017-04-21 DIAGNOSIS — N179 Acute kidney failure, unspecified: Secondary | ICD-10-CM | POA: Diagnosis not present

## 2017-04-21 DIAGNOSIS — I350 Nonrheumatic aortic (valve) stenosis: Secondary | ICD-10-CM | POA: Diagnosis not present

## 2017-04-21 DIAGNOSIS — J810 Acute pulmonary edema: Secondary | ICD-10-CM | POA: Diagnosis not present

## 2017-04-21 DIAGNOSIS — N390 Urinary tract infection, site not specified: Secondary | ICD-10-CM | POA: Diagnosis present

## 2017-04-21 DIAGNOSIS — E785 Hyperlipidemia, unspecified: Secondary | ICD-10-CM | POA: Diagnosis present

## 2017-04-21 DIAGNOSIS — J441 Chronic obstructive pulmonary disease with (acute) exacerbation: Secondary | ICD-10-CM | POA: Diagnosis present

## 2017-04-21 DIAGNOSIS — I82C12 Acute embolism and thrombosis of left internal jugular vein: Secondary | ICD-10-CM | POA: Diagnosis present

## 2017-04-21 DIAGNOSIS — J9602 Acute respiratory failure with hypercapnia: Secondary | ICD-10-CM | POA: Diagnosis not present

## 2017-04-21 DIAGNOSIS — Z902 Acquired absence of lung [part of]: Secondary | ICD-10-CM | POA: Diagnosis not present

## 2017-04-21 LAB — COMPREHENSIVE METABOLIC PANEL
ALK PHOS: 83 U/L (ref 38–126)
ALT: 40 U/L (ref 17–63)
ANION GAP: 8 (ref 5–15)
AST: 32 U/L (ref 15–41)
Albumin: 2.8 g/dL — ABNORMAL LOW (ref 3.5–5.0)
BUN: 18 mg/dL (ref 6–20)
CALCIUM: 8.6 mg/dL — AB (ref 8.9–10.3)
CO2: 37 mmol/L — AB (ref 22–32)
Chloride: 95 mmol/L — ABNORMAL LOW (ref 101–111)
Creatinine, Ser: 1.24 mg/dL (ref 0.61–1.24)
GFR calc non Af Amer: 53 mL/min — ABNORMAL LOW (ref >60)
GLUCOSE: 119 mg/dL — AB (ref 65–99)
Potassium: 2.8 mmol/L — ABNORMAL LOW (ref 3.5–5.1)
SODIUM: 140 mmol/L (ref 135–145)
Total Bilirubin: 0.8 mg/dL (ref 0.3–1.2)
Total Protein: 5.5 g/dL — ABNORMAL LOW (ref 6.5–8.1)

## 2017-04-21 LAB — HEPARIN LEVEL (UNFRACTIONATED)
Heparin Unfractionated: 0.41 IU/mL (ref 0.30–0.70)
Heparin Unfractionated: 0.3 IU/mL (ref 0.30–0.70)

## 2017-04-21 LAB — MAGNESIUM: Magnesium: 2.1 mg/dL (ref 1.7–2.4)

## 2017-04-21 MED ORDER — FUROSEMIDE 10 MG/ML IJ SOLN
80.0000 mg | Freq: Two times a day (BID) | INTRAMUSCULAR | Status: DC
  Administered 2017-04-21 – 2017-04-22 (×2): 80 mg via INTRAVENOUS
  Filled 2017-04-21 (×3): qty 8

## 2017-04-21 MED ORDER — LISINOPRIL 2.5 MG PO TABS
2.5000 mg | ORAL_TABLET | Freq: Every day | ORAL | Status: DC
  Administered 2017-04-21 – 2017-04-22 (×2): 2.5 mg via ORAL
  Filled 2017-04-21 (×2): qty 1

## 2017-04-21 MED ORDER — POTASSIUM CHLORIDE CRYS ER 20 MEQ PO TBCR
40.0000 meq | EXTENDED_RELEASE_TABLET | Freq: Once | ORAL | Status: AC
  Administered 2017-04-21: 40 meq via ORAL
  Filled 2017-04-21: qty 2

## 2017-04-21 MED ORDER — POTASSIUM CHLORIDE CRYS ER 20 MEQ PO TBCR
40.0000 meq | EXTENDED_RELEASE_TABLET | Freq: Two times a day (BID) | ORAL | Status: DC
  Administered 2017-04-21 – 2017-04-23 (×6): 40 meq via ORAL
  Filled 2017-04-21 (×6): qty 2

## 2017-04-21 NOTE — Progress Notes (Signed)
ANTICOAGULATION CONSULT NOTE - Follow-up Consult  Pharmacy Consult for heparin Indication: DVT  Allergies  Allergen Reactions  . Atorvastatin Other (See Comments)    joint aches    Patient Measurements: Height: '5\' 10"'$  (177.8 cm) Weight: 188 lb 1.6 oz (85.3 kg) IBW/kg (Calculated) : 73 Heparin Dosing Weight: 91.6kg  Vital Signs: Temp: 98.1 F (36.7 C) (05/21 2132) Temp Source: Oral (05/21 2132) BP: 117/57 (05/21 2132) Pulse Rate: 70 (05/21 2132)  Labs:  Recent Labs  04/18/2017 1612 04/21/17 0242  HGB 10.5*  --   HCT 34.8*  --   PLT 204  --   APTT 33  --   LABPROT 17.5*  --   INR 1.42  --   HEPARINUNFRC  --  0.41  CREATININE 1.34* 1.24  TROPONINI 0.03*  --     Estimated Creatinine Clearance: 49.1 mL/min (by C-G formula based on SCr of 1.24 mg/dL).  Assessment: 67 yof s/p recent CABG presented to the ED with SOB and leg pain. Found to have a DVT. CT negative for PE. Heparin level therapeutic (0.41) on gtt at 1100 untis/hr. No bleeding noted.  Goal of Therapy:  Heparin level 0.3-0.7 units/ml Monitor platelets by anticoagulation protocol: Yes   Plan:  Continue heparin gtt 1100 units/hr  Check a 6 hour confirmatory heparin level  Sherlon Handing, PharmD, BCPS Clinical pharmacist, pager [phone] 04/21/2017,3:56 AM

## 2017-04-21 NOTE — Clinical Social Work Note (Signed)
Clinical Social Work Assessment  Patient Details  Name: Steven Everett MRN: [id number] Date of Birth: 11/23/1935  Date of referral:  04/21/17               Reason for consult:  Facility Placement, Discharge Planning                Permission sought to share information with:  Facility Sport and exercise psychologist, Family Supports Permission granted to share information::  Yes, Verbal Permission Granted  Name::     Steven Everett  Agency::  [facility]   Relationship::  Son  Sport and exercise psychologist Information:  [phone]  Housing/Transportation Living arrangements for the past 2 months:  Single Family Home Source of Information:  Patient Patient Interpreter Needed:  None Criminal Activity/Legal Involvement Pertinent to Current Situation/Hospitalization:  No - Comment as needed Significant Relationships:  Adult Children Lives with:  Self (facility resident for past two weeks) Do you feel safe going back to the place where you live?  No Need for family participation in patient care:  No (Coment)  Care giving concerns: Patient came from [facility] where he was admitted for past two weeks after last hospital admission. Patient lives alone.  Social Worker assessment / plan: CSW received consult for possible SNF placement at time of discharge. Patient prefers to return to SNF for continued rehab. Patient is not yet assessed by PT. CSW to continue to follow and assist with discharge planning needs.  Employment status:  Retired Forensic scientist:  Medicare PT Recommendations:  Not assessed at this time Information / Referral to community resources:  [place]  Patient/Family's Response to care: Patient and patient's son recognize the need for rehab before retuning home and are amenable to return to [employer]. Family appreciative of patient's care.  Patient/Family's Understanding of and Emotional Response to Diagnosis, Current Treatment, and Prognosis:  Patient was quiet during  assessment but voiced preference for SNF placement. Patient and family do not feel safe for patient to return home. Patient and family expressed understanding of CSW role and discharge process. Patient has no questions or concerns about plan.  Emotional Assessment Appearance:  Appears younger than stated age Attitude/Demeanor/Rapport:  Lethargic, Other (appropriate) Affect (typically observed):  Appropriate, Quiet Orientation:  Oriented to Self, Oriented to  Time, Oriented to Place, Oriented to Situation Alcohol / Substance use:  Not Applicable Psych involvement (Current and /or in the community):  No (Comment)  Discharge Needs  Concerns to be addressed:  Care Coordination, Discharge Planning Concerns Readmission within the last 30 days:  Yes Current discharge risk:  Dependent with Mobility Barriers to Discharge:  Continued Medical Work up   Steven Emms, LCSW 04/21/2017, 11:56 AM

## 2017-04-21 NOTE — Evaluation (Signed)
Physical Therapy Evaluation Patient Details Name: Steven Everett MRN: [id number] DOB: December 19, 1934 Today's Date: 04/21/2017   History of Present Illness  Pt is an 82 y/o male admitted from SNF with LUE DVT, on heparin with CHF. PMHx: s/p CABGx2 on 04/01/17 secondary to NSTEMI. CAD, DM, R lung cancer, liver cirrhosis, HTN, PAF, and cardiac defibrillator placement in 2014.  Clinical Impression  Pt familiar from  Prior admission and reports he was making steady gains at SNF and started requiring supplemental O2 2 days ago. Pt with decreased strength, gait, balance, ability to recall precautions and activity tolerance who will benefit from acute therapy to maximize mobility, function, gait and adherence to precautions to return pt to independent PLOF. Pt with dizziness after walking 12' and limited mobility this date. MD aware of session.   sats 91% at rest on 2L on arrival, drop to 88% with limited gait on 2L, 97% on 2L end of session BP sitting 118/63 (77), HR 67 BP standing 109/61 (73), HR 92  Follow Up Recommendations Supervision/Assistance - 24 hour;SNF    Equipment Recommendations  Rolling walker with 5" wheels    Recommendations for Other Services       Precautions / Restrictions Precautions Precautions: Fall;Sternal Precaution Comments: pt able to recall 2/5 precautions , watch sats      Mobility  Bed Mobility Overal bed mobility: Modified Independent Bed Mobility: Supine to Sit     Supine to sit: Supervision;HOB elevated     General bed mobility comments: pt able to pivot supine to sit and rise to sitting with HOB 25 degrees without assist or use of his arms  Transfers Overall transfer level: Needs assistance   Transfers: Sit to/from Stand Sit to Stand: Min assist;Min guard         General transfer comment: pt stood from bed with use of momentum and arms across chest. To stand from low chair min assist with rocking- good hand  placement  Ambulation/Gait Ambulation/Gait assistance: Min guard Ambulation Distance (Feet): 12 Feet Assistive device: Rolling walker (2 wheeled) Gait Pattern/deviations: Step-through pattern;Decreased stride length;Trunk flexed Gait velocity: decreased   General Gait Details: cues for posture. Pt walked 12' then reported 8/10 dizziness with chair pulled to him. Pt reported dizziness resolved with sitting with VSS. Pt stood and returned 12' to Physicist, medical    Modified Rankin (Stroke Patients Only)       Balance Overall balance assessment: Needs assistance   Sitting balance-Leahy Scale: Good       Standing balance-Leahy Scale: Fair                               Pertinent Vitals/Pain Pain Assessment: No/denies pain    Home Living Family/patient expects to be discharged to:: Private residence Living Arrangements: Alone Available Help at Discharge: Family;Available PRN/intermittently Type of Home: Apartment Home Access: Level entry     Home Layout: One level Home Equipment: Cane - single point;Shower seat Additional Comments: all prior to CABG, since CABG has [facility] at [facility]     Prior Function Level of Independence: Independent         Comments: since CABG walking several hundred feet with RW, transfers without assist, assist for ADLs     Hand Dominance        Extremity/Trunk Assessment   Upper Extremity Assessment Upper Extremity Assessment: Generalized weakness  Lower Extremity Assessment Lower Extremity Assessment: Generalized weakness (bil LE edema )    Cervical / Trunk Assessment Cervical / Trunk Assessment: Kyphotic  Communication   Communication: HOH  Cognition Arousal/Alertness: Awake/alert Behavior During Therapy: WFL for tasks assessed/performed Overall Cognitive Status: Impaired/Different from baseline                       Memory: Decreased recall of precautions                 General Comments      Exercises     Assessment/Plan    PT Assessment Patient needs continued PT services  PT Problem List Decreased activity tolerance;Decreased balance;Decreased mobility;Decreased coordination;Decreased knowledge of use of DME;Decreased safety awareness;Decreased knowledge of precautions;Cardiopulmonary status limiting activity       PT Treatment Interventions DME instruction;Gait training;Stair training;Functional mobility training;Therapeutic activities;Therapeutic exercise;Balance training;Neuromuscular re-education;Patient/family education    PT Goals (Current goals can be found in the Care Plan section)  Acute Rehab PT Goals Patient Stated Goal: return home to watch baseball after rehab PT Goal Formulation: With patient Time For Goal Achievement: 05/05/17 Potential to Achieve Goals: Good    Frequency Min 3X/week   Barriers to discharge Decreased caregiver support      Co-evaluation               AM-PAC PT "6 Clicks" Daily Activity  Outcome Measure Difficulty turning over in bed (including adjusting bedclothes, sheets and blankets)?: A Little Difficulty moving from lying on back to sitting on the side of the bed? : A Little Difficulty sitting down on and standing up from a chair with arms (e.g., wheelchair, bedside commode, etc,.)?: A Lot Help needed moving to and from a bed to chair (including a wheelchair)?: A Little Help needed walking in hospital room?: A Little Help needed climbing 3-5 steps with a railing? : A Little 6 Click Score: 17    End of Session Equipment Utilized During Treatment: Gait belt;Oxygen Activity Tolerance: Patient tolerated treatment well Patient left: in chair;with call bell/phone within reach;with chair alarm set Nurse Communication: Mobility status;Precautions PT Visit Diagnosis: Difficulty in walking, not elsewhere classified (R26.2)    Time: 1240-1305 PT Time Calculation (min) (ACUTE ONLY): 25  min   Charges:   PT Evaluation $PT Eval Moderate Complexity: 1 Procedure     PT G Codes:   PT G-Codes **NOT FOR INPATIENT CLASS** Functional Assessment Tool Used: Clinical judgement Functional Limitation: Mobility: Walking and moving around Mobility: Walking and Moving Around Current Status (T5974): At least 20 percent but less than 40 percent impaired, limited or restricted Mobility: Walking and Moving Around Goal Status [id number]): At least 1 percent but less than 20 percent impaired, limited or restricted    [name], [place]   Sandy Salaam Vannia Pola 04/21/2017, 1:24 PM

## 2017-04-21 NOTE — Progress Notes (Signed)
PROGRESS NOTE  Steven Everett  HYQ:[id number] DOB: 06-25-1935 DOA: 04/15/2017 PCP: Hali Marry, MD  Outpatient Specialists: Cardiology, Dr. Stanford Breed Cardiothoracic surgery, Dr. Servando Snare  Brief Narrative: Steven Everett is an 82 y.o. male with a history of HTN, HLD, CAD s/p CABG, chronic HFrEF, moderate AS, PAF s/p ICD and watchman, cirrhosis, T2DM, hypothyroidism, and Lung CA who presented to the ED from SNF for left arm swelling.   He reported gradual left arm swelling at SNF while recovering since discharge 5/9 following CABG x2 performed 5/2 by Dr. Servando Snare for NSTEMI. He also had bilateral leg swelling and worsening dyspnea, newly requiring oxygen, with worsening orthopnea. On arrival he was afebrile, tachypneic with creatinine 1.34, troponin 0.03, BNP 254.8. Chest x-ray showed bilateral pleural effusions similar to previous study 5/7 with probable small apical pneumothorax. Left upper extremity doppler showed axillary, subclavin, and possibly jugular vein DVT. CTA chest subsequently ruled out PE. Heparin infusion was started, and IV lasix provided for acute systolic CHF.  Assessment & Plan: Principal Problem:   DVT (deep venous thrombosis) (HCC) Active Problems:   Hypothyroidism   CAD (coronary artery disease)   Hyperlipidemia   PAF (paroxysmal atrial fibrillation) (HCC)   ICD (implantable cardioverter-defibrillator) in place   Anemia due to chronic kidney disease   Hypokalemia  Acute on chronic systolic CHF: Last EF 95-28% by TEE 5/2, s/p ICD. 200lbs at recent discharge, 206lbs today.  - Continue lasix '40mg'$  IV BID (was '40mg'$  po BID), titrate as needed. Creatinine improved from admission.  - Continue metoprolol, consider switch to succinate formulation. ?Not on ACE/ARB due to CKD. - Cardiology consulted - Strict I/O, daily weights.   Acute DVT of left upper extremity: No PE on CTA chest - Continue heparin gtt for now  Acute hypoxemic respiratory failure: Due to acute  CHF exacerbation, pleural effusions which were present on CXR prior to last DC, possibly contribution from COPD (no formal Dx, but has ~70 pack-year smoking history, quit 1990).  - Continue oxygen prn SpO2 maintain >90%.  - Diuresis as above - Continue duonebs prn dyspnea, wheezing.  Paroxysmal atrial fibrillation s/p watchman: Currently rate controlled.  - Continue amiodarone  CAD s/p CABG 5/2:  - Continue ASA, BB, statin  Hypokalemia: Acute, mild. Patient's initial potassium was noted to be 3.3 on admission and worsened to 2.8 despite repletion in the face of significant lasix dose.  - Replete 40mq BID and recheck in AM (Mg wnl)  Anemia: Hemoglobin on admission 10.5 , but baseline appears to be around 9-10. - Continue to monitor   Essential hypertension - Continue metoprolol  Chronic kidney disease stage III - Continue to monitor  Hyperlipidemia - Continue atorvastatin   Hypothyroidism - Continue levothyroxine   BPH  - Continue Proscar  History of lung CA: Dx 1998 s/p RML wedge resection and right lower lobectomy, chemo, XRT.   DVT prophylaxis: Therapeutic heparin Code Status: Full Family Communication: Son by phone.  Disposition Plan: Inpatient treatment of acute CHF and DVT. Anticipate DC to SNF.   Consultants:   Cardiology  Procedures:  - LUE doppler U/S: Acute deep vein thrombosis noted in the left subclavian and axillary veins. Possible partial thrombus in the left internal jugular vein- difficult to evaluate. - Echocardiogram ordered  Antimicrobials:  None   Subjective: Patient feels the same as admission last night, very short of breath worse with exertion without chest pain. Legs very swollen. No significant left arm pain with the swelling. No bleeding, bruising noted.  Objective: Vitals:   04/01/2017 2000 04/19/2017 2030 04/06/2017 2132 04/21/17 0541  BP: 110/65 (!) 116/58 (!) 117/57 119/61  Pulse: 80 92 70 88  Resp: (!) 23 (!) 23 (!) 21 20    Temp:   98.1 F (36.7 C) 97.5 F (36.4 C)  TempSrc:   Oral Oral  SpO2: 95% 100% 98% 99%  Weight:   85.3 kg (188 lb 1.6 oz) 93.6 kg (206 lb 4.8 oz)  Height:   '5\' 10"'$  (1.778 m)     Intake/Output Summary (Last 24 hours) at 04/21/17 1223 Last data filed at 04/21/17 0900  Gross per 24 hour  Intake           376.03 ml  Output              600 ml  Net          -223.97 ml   Filed Weights   04/22/2017 1454 04/12/2017 2132 04/21/17 0541  Weight: 92.5 kg (204 lb) 85.3 kg (188 lb 1.6 oz) 93.6 kg (206 lb 4.8 oz)   Examination: General exam: Pleasant elderly male in no distress Respiratory system: Short sentences due to dyspnea on 2L by [place]. Diminished breath sounds L > R and intermittent diffuse expiratory wheezing.  Cardiovascular system: Regular rate and rhythm. No murmur, rub, or gallop. No JVD, and 2+ pedal edema. Gastrointestinal system: Abdomen soft, non-tender, non-distended, with normoactive bowel sounds. No organomegaly or masses felt. Central nervous system: Alert and oriented. No focal neurological deficits. Extremities: Warm, no deformities Skin: Midline sternotomy incision c/d/i. Incompletely healed inferiorly but no dehiscence. Psychiatry: Judgement and insight appear normal. Mood & affect appropriate.   Data Reviewed: I have personally reviewed following labs and imaging studies  CBC:  Recent Labs Lab 04/24/2017 1612  WBC 9.2  NEUTROABS 7.8*  HGB 10.5*  HCT 34.8*  MCV 92.6  PLT 681   Basic Metabolic Panel:  Recent Labs Lab 04/27/2017 1612 04/21/17 0242  NA 139 140  K 3.3* 2.8*  CL 96* 95*  CO2 35* 37*  GLUCOSE 172* 119*  BUN 23* 18  CREATININE 1.34* 1.24  CALCIUM 8.6* 8.6*  MG  --  2.1   GFR: Estimated Creatinine Clearance: 54.6 mL/min (by C-G formula based on SCr of 1.24 mg/dL). Liver Function Tests:  Recent Labs Lab 04/21/17 0242  AST 32  ALT 40  ALKPHOS 83  BILITOT 0.8  PROT 5.5*  ALBUMIN 2.8*   Coagulation Profile:  Recent Labs Lab  04/04/2017 1612  INR 1.42   Cardiac Enzymes:  Recent Labs Lab 04/30/2017 1612  TROPONINI 0.03*   Urine analysis:    Component Value Date/Time   COLORURINE YELLOW 04/01/2017 2245   APPEARANCEUR HAZY (A) 04/03/2017 2245   LABSPEC 1.020 03/31/2017 2245   PHURINE 5.0 04/01/2017 2245   GLUCOSEU NEGATIVE 04/03/2017 2245   HGBUR LARGE (A) 04/15/2017 2245   BILIRUBINUR NEGATIVE 04/03/2017 2245   BILIRUBINUR neg 02/03/2017 1613   [place] 04/07/2017 2245   PROTEINUR NEGATIVE 04/27/2017 2245   UROBILINOGEN 4.0 02/03/2017 1613   UROBILINOGEN 1.0 06/20/2012 0950   NITRITE NEGATIVE 04/29/2017 2245   LEUKOCYTESUR LARGE (A) 04/07/2017 2245   No results found for this or any previous visit (from the past 240 hour(s)).    Radiology Studies: Dg Chest 2 View  Result Date: 04/11/2017 CLINICAL DATA:  Short of breath.  Lung cancer EXAM: CHEST  2 VIEW COMPARISON:  04/06/2017 FINDINGS: CABG changes. Internal cardiac defibrillator unchanged. Moderate bilateral effusions with bibasilar atelectasis  unchanged. Negative for edema. Small right apical pneumothorax unchanged. PICC line removed since the prior study. Apical scarring bilaterally. IMPRESSION: Bilateral pleural effusions and bibasilar atelectasis. No change from the prior study. Negative for edema Probable small right apical pneumothorax unchanged. Electronically Signed   By: Franchot Gallo M.D.   On: 04/04/2017 17:00   Ct Angio Chest Pe W Or Wo Contrast  Result Date: 04/16/2017 CLINICAL DATA:  Complains of dyspnea for 2 days. History of sinus node dysfunction. EXAM: CT ANGIOGRAPHY CHEST WITH CONTRAST TECHNIQUE: Multidetector CT imaging of the chest was performed using the standard protocol during bolus administration of intravenous contrast. Multiplanar CT image reconstructions and MIPs were obtained to evaluate the vascular anatomy. CONTRAST:  80 cc of Isovue 370 COMPARISON:  03/31/2017 FINDINGS: Cardiovascular: There is a left chest wall  pacer device with leads in the right atrial appendage and right ventricle. Moderate cardiac enlargement. Previous median sternotomy and CABG procedure. No pericardial effusion identified. The main pulmonary artery appears patent. No saddle embolus. No lobar or segmental pulmonary artery filling defects identified. Mediastinum/Nodes: Fluid identified within the retrosternal space compatible with recent sternotomy. No specific findings identified to suggest abscess. Pre-vascular lymph node Measures 1.5 cm. Increased from 9 mm previously. The trachea appears patent. Unremarkable appearance of the esophagus. No axillary or supraclavicular adenopathy. Lungs/Pleura: There is a moderate to large left pleural effusion present. Overlying compressive type atelectasis and consolidation involving the left lower lobe and lingula noted. Paramediastinal architectural distortion, fibrosis and scarring noted compatible changes of external beam radiation. There is mild diffuse pleural thickening overlying the right lung. Superimposed airspace consolidation within the right lung base is identified, image 74 of series 8. Unchanged right lung nodule measuring 4 mm, image 44 of series 8. Upper Abdomen: Gallstones are identified measuring up to 1.9 cm. No acute abnormality identified within the upper abdomen. Musculoskeletal: There is multi level degenerative disc disease identified within the thoracic spine. Recent sternotomy defect is again noted. Sternotomy wires remain intact. Review of the MIP images confirms the above findings. IMPRESSION: 1. No evidence for acute pulmonary embolus. 2. Changes compatible with recent median sternotomy with CABG procedure. Findings include retrosternal fluid and unhealed sternotomy defect. 3. There is a moderate to large left pleural effusion. 4. Changes of external beam radiation involve the right lung. Superimposed airspace opacity within the right lung base is noted which is nonspecific. Aspiration  or pneumonia cannot be excluded. 5. Gallstones. Electronically Signed   By: Kerby Moors M.D.   On: 04/18/2017 20:47    Scheduled Meds: . alfuzosin  10 mg Oral Q breakfast  . amiodarone  200 mg Oral Daily  . aspirin EC  81 mg Oral BID  . finasteride  5 mg Oral Daily  . furosemide  40 mg Intravenous BID  . guaiFENesin  600 mg Oral BID  . levothyroxine  112 mcg Oral QAC breakfast  . metoprolol tartrate  12.5 mg Oral BID  . pantoprazole  40 mg Oral Daily  . potassium chloride  40 mEq Oral BID  . pravastatin  40 mg Oral Daily   Continuous Infusions: . heparin 1,100 Units/hr (04/21/17 0600)     LOS: 0 days   Time spent: 25 minutes.  Vance Gather, MD [facility] Pager [phone]  If 7PM-7AM, please contact night-coverage [email or web address] Password [facility] 04/21/2017, 12:23 PM

## 2017-04-21 NOTE — Progress Notes (Signed)
ANTICOAGULATION CONSULT NOTE - Follow-up Consult  Pharmacy Consult for heparin Indication: DVT  Allergies  Allergen Reactions  . Atorvastatin Other (See Comments)    joint aches    Patient Measurements: Height: '5\' 10"'$  (177.8 cm) Weight: 206 lb 4.8 oz (93.6 kg) IBW/kg (Calculated) : 73 Heparin Dosing Weight: 91.6kg  Vital Signs: Temp: 97.5 F (36.4 C) (05/22 0541) Temp Source: Oral (05/22 0541) BP: 109/61 (05/22 1317) Pulse Rate: 92 (05/22 1317)  Labs:  Recent Labs  04/26/2017 1612 04/21/17 0242 04/21/17 1119  HGB 10.5*  --   --   HCT 34.8*  --   --   PLT 204  --   --   APTT 33  --   --   LABPROT 17.5*  --   --   INR 1.42  --   --   HEPARINUNFRC  --  0.41 0.30  CREATININE 1.34* 1.24  --   TROPONINI 0.03*  --   --     Estimated Creatinine Clearance: 54.6 mL/min (by C-G formula based on SCr of 1.24 mg/dL).  Assessment: Steven yof s/p recent CABG presented to the ED with SOB and leg pain. Found to have a DVT. CTA negative for PE. Heparin level remains therapeutic (0.3) on gtt at 1100 untis/hr but trending down to bottom of range. No bleeding or IV line issues noted per discussion with RN.  Goal of Therapy:  Heparin level 0.3-0.7 units/ml Monitor platelets by anticoagulation protocol: Yes   Plan:  Increase heparin gtt slightly to 1200 units/hr to keep in range Daily heparin level/CBC Monitor for s/sx bleeding   Elicia Lamp, PharmD, BCPS Clinical Pharmacist Rx Phone # for today: [phone] After 3:30PM, please call Main Rx: [phone] 04/21/2017 1:36 PM

## 2017-04-21 NOTE — Consult Note (Signed)
Cardiology Consultation:   Patient ID: Steven Everett; [id number]; August 20, 1935   Admit date: 04/24/2017 Date of Consult: 04/21/2017  Primary Care Provider: Hali Marry, MD Primary Cardiologist: Dr. Stanford Breed Primary Electrophysiologist:  Dr. Rayann Heman   Patient Profile:   Steven Everett is a 82 y.o. male with a hx of hypertension, hyperlidemia, GERD/Barrett's esophagus, diet controlled diabetes, hypothyroidism, lung cancer 1998 treated with surgery, chemo and radiation, ICD placement for LV dysfunction 2014, PAF S/P Watchman 01/2016, NSTEMI and subsequent CABG 04/01/2017 who is being seen today for the evaluation of CHF at the request of Dr. Bonner Puna.  History of Present Illness:   Steven Everett has been at a SNF, [facility] in [place],  for rehabilitation after a CABG done on 04/01/17. He was initially doing well with increase in his activity level. The patient reports that he did have low sats and needed oxygen which made him feel better. He has had progressively worse lower extremity edema and then developed left arm edema. He denies any chest pain or palpitations. He does have dizziness with rising to standing and had dizziness with walking this morning. He has orthopnea.   The patient has acute left upper arm edema and a Doppler revealed acute DVT. He is on IV heparin. He had been on Xarelto in the past for atrial fib, but states that he did not like being on blood thinners and that is why a Watchman was placed. He understands that he will have to be on blood thinners now for the DVT.   He is a former smoker of 2 PPD for 36 years, quit in 1990.  Past Medical History:  Diagnosis Date  . Aortic stenosis   . Barrett's esophagus   . Cancer of right lung ([place])    lower lobes  . Chronic systolic dysfunction of left ventricle   . Cirrhosis of liver (HCC)    w/o mention of alcohol  . Diverticulosis of colon   . DVT (deep venous thrombosis) ([place]) 04/16/2017   left subclavian and  axillary veins/notes 04/15/2017  . GERD (gastroesophageal reflux disease)   . Hemorrhoids, internal   . Hyperlipidemia   . Hypertension   . Hypothyroidism   . Ischemic cardiomyopathy   . Paroxysmal atrial fibrillation ([place]) 12/14/2013   a. s/p Watchman   . Rheumatic fever   . Seborrheic dermatitis   . Sinus node dysfunction (HCC)   . Type II diabetes mellitus ([place])    "weighed over 300#; lost weight; lost diabetes" (01/31/2016)    Past Surgical History:  Procedure Laterality Date  . ANKLE FRACTURE SURGERY Right 1940s  . APPENDECTOMY  1940s  . CARDIAC CATHETERIZATION  "years ago"  . CARDIAC DEFIBRILLATOR PLACEMENT  08/12/13   Medtronic Evera XT DR implanted by [name] for primary prevention of sudden death  . CARDIAC PACEMAKER PLACEMENT    . CATARACT EXTRACTION, BILATERAL Bilateral 8-11   Dr Ayesha Rumpf  . COLONOSCOPY    . CORONARY ARTERY BYPASS GRAFT N/A 04/01/2017   Procedure: CORONARY ARTERY BYPASS GRAFTING (CABG) x 2 (LIMA to DIAGONAL and SVG to RCA) WITH ENDOSCOPIC HARVESTING OF RIGHT SAPHENOUS VEIN;  Surgeon: Grace Isaac, MD;  Location: [facility];  Service: Open Heart Surgery;  Laterality: N/A;  . FRACTURE SURGERY    . IMPLANTABLE CARDIOVERTER DEFIBRILLATOR IMPLANT N/A 08/12/2013   Procedure: IMPLANTABLE CARDIOVERTER DEFIBRILLATOR IMPLANT;  Surgeon: Coralyn Jacalyn Biggs, MD;  Location: [facility] CATH LAB;  Service: Cardiovascular;  Laterality: N/A;  . JOINT REPLACEMENT    .  LEFT ATRIAL APPENDAGE OCCLUSION N/A 01/31/2016   Procedure: LEFT ATRIAL APPENDAGE OCCLUSION;  Surgeon: Thompson Grayer, MD;  Location: [facility] CV LAB;  Service: Cardiovascular;  Laterality: N/A;  . LUMBAR DISC SURGERY  X 2   "herniated discs"  . LUNG REMOVAL, PARTIAL Right 1998   for lung cancer by Dr Rollene Fare; middle and lower lobes  . RIGHT/LEFT HEART CATH AND CORONARY ANGIOGRAPHY N/A 03/31/2017   Procedure: Right/Left Heart Cath and Coronary Angiography;  Surgeon: Belva Crome, MD;  Location: [facility] CV LAB;  Service:  Cardiovascular;  Laterality: N/A;  . TEE WITHOUT CARDIOVERSION N/A 02/14/2013   Procedure: TRANSESOPHAGEAL ECHOCARDIOGRAM (TEE);  Surgeon: Lelon Perla, MD;  Location: [facility] ENDOSCOPY;  Service: Cardiovascular;  Laterality: N/A;  . TEE WITHOUT CARDIOVERSION N/A 01/09/2016   Procedure: TRANSESOPHAGEAL ECHOCARDIOGRAM (TEE);  Surgeon: Josue Hector, MD;  Location: [facility] ENDOSCOPY;  Service: Cardiovascular;  Laterality: N/A;  . TEE WITHOUT CARDIOVERSION N/A 03/20/2016   Procedure: TRANSESOPHAGEAL ECHOCARDIOGRAM (TEE);  Surgeon: Lelon Perla, MD;  Location: [facility] ENDOSCOPY;  Service: Cardiovascular;  Laterality: N/A;  . TEE WITHOUT CARDIOVERSION N/A 04/01/2017   Procedure: TRANSESOPHAGEAL ECHOCARDIOGRAM (TEE);  Surgeon: Grace Isaac, MD;  Location: [facility];  Service: Open Heart Surgery;  Laterality: N/A;  . TOTAL HIP ARTHROPLASTY Right    Dr Alvan Dame     Inpatient Medications: Scheduled Meds: . alfuzosin  10 mg Oral Q breakfast  . amiodarone  200 mg Oral Daily  . aspirin EC  81 mg Oral BID  . finasteride  5 mg Oral Daily  . furosemide  40 mg Intravenous BID  . guaiFENesin  600 mg Oral BID  . levothyroxine  112 mcg Oral QAC breakfast  . metoprolol tartrate  12.5 mg Oral BID  . pantoprazole  40 mg Oral Daily  . potassium chloride  40 mEq Oral BID  . pravastatin  40 mg Oral Daily   Continuous Infusions: . heparin 1,100 Units/hr (04/21/17 0600)   PRN Meds: acetaminophen **OR** acetaminophen, diphenhydrAMINE, ipratropium-albuterol, ondansetron **OR** ondansetron (ZOFRAN) IV, traMADol  Allergies:    Allergies  Allergen Reactions  . Atorvastatin Other (See Comments)    joint aches    Social History:   Social History   Social History  . Marital status: Widowed    Spouse name: Hoyle Sauer  . Number of children: 1  . Years of education: N/A   Occupational History  .  Retired   Social History Main Topics  . Smoking status: Former Smoker    Packs/day: 2.00    Years: 36.00    Types:  Cigarettes    Quit date: 02/14/1989  . Smokeless tobacco: Never Used  . Alcohol use No  . Drug use: No  . Sexual activity: Not Currently   Other Topics Concern  . Not on file   Social History Narrative  . No narrative on file    Family History:   The patient's family history includes Cancer in his father and mother; Diabetes in his mother.  ROS:  Please see the history of present illness.  All other ROS reviewed and negative.     Physical Exam/Data:   Vitals:   04/06/2017 2000 04/12/2017 2030 04/27/2017 2132 04/21/17 0541  BP: 110/65 (!) 116/58 (!) 117/57 119/61  Pulse: 80 92 70 88  Resp: (!) 23 (!) 23 (!) 21 20  Temp:   98.1 F (36.7 C) 97.5 F (36.4 C)  TempSrc:   Oral Oral  SpO2: 95% 100% 98% 99%  Weight:   188 lb 1.6 oz (85.3 kg) 206 lb 4.8 oz (93.6 kg)  Height:   '5\' 10"'$  (1.778 m)     Intake/Output Summary (Last 24 hours) at 04/21/17 1251 Last data filed at 04/21/17 0900  Gross per 24 hour  Intake           376.03 ml  Output              600 ml  Net          -223.97 ml   Filed Weights   04/19/2017 1454 04/06/2017 2132 04/21/17 0541  Weight: 204 lb (92.5 kg) 188 lb 1.6 oz (85.3 kg) 206 lb 4.8 oz (93.6 kg)   Body mass index is 29.6 kg/m.  General:  Well nourished, well developed, in no acute distress, mildly short of breath with speaking HEENT: normal Lymph: no adenopathy Neck: no JVD Endocrine:  No thryomegaly Vascular: No carotid bruits; FA pulses 2+ bilaterally without bruits  Cardiac:  normal S1, S2; RRR; Harsh systolic murmur heard at LUSB and RUSB Lungs:  Course inspiratory and expiratory wheezes, mild increase in work of breathing Abd: soft, nontender, no hepatomegaly  Ext: Bilateral lower extremity 2+ edema with ace wraps in place. Left arm edema Musculoskeletal:  No deformities, BUE and BLE strength with mild weakness and equal Skin: warm and dry  Neuro:  CNs 2-12 intact, no focal abnormalities noted Psych:  Normal affect    EKG:  The EKG was  personally reviewed and demonstrates atrial fibrillation (per machine, actually may be sinus rhythm) with RBBB at 91 bpm  Relevant CV Studies:  Left upper extremity duplex 04/09/2017 Summary: Acute deep vein thrombosis noted in the left subclavian and axillary veins. Possible partial thrombus in the left internal jugular vein- difficult to evaluate.  TEE intraoperatively 04/02/17  Left ventricle: Normal wall thickness. Cavity is mildly dilated. LV systolic function is moderately reduced with an EF of 35-40%. Wall motion is abnormal. Diffuse hypokinesis with regional variation. The inferior wall is severely hypokinetic relative to the remaining segments No thrombus present.  Aortic valve: The valve is trileaflet. Moderate valve thickening present. Moderate valve calcification present. Moderately decreased leaflet separation. Mild stenosis. Mild regurgitation. No AV vegetation.  Right ventricle: Normal wall thickness. Cavity is moderately dilated. Mildly reduced systolic function.  Tricuspid valve: Valve has a dilated annulus. Mild regurgitation. The tricuspid valve regurgitation jet is central.  Left atrium: ASD or PFO closure device in interatrial septum.  Mitral valve: Dilated mitral annulus. Mild leaflet thickening is present. Mild leaflet calcification is present. Mild mitral annular calcification. Mild regurgitation.     Laboratory Data:  Chemistry Recent Labs Lab 04/05/2017 1612 04/21/17 0242  NA 139 140  K 3.3* 2.8*  CL 96* 95*  CO2 35* 37*  GLUCOSE 172* 119*  BUN 23* 18  CREATININE 1.34* 1.24  CALCIUM 8.6* 8.6*  GFRNONAA 48* 53*  GFRAA 56* >60  ANIONGAP 8 8     Recent Labs Lab 04/21/17 0242  PROT 5.5*  ALBUMIN 2.8*  AST 32  ALT 40  ALKPHOS 83  BILITOT 0.8   Hematology Recent Labs Lab 04/21/2017 1612  WBC 9.2  RBC 3.76*  HGB 10.5*  HCT 34.8*  MCV 92.6  MCH 27.9  MCHC 30.2  RDW 15.9*  PLT 204   Cardiac Enzymes Recent Labs Lab 04/06/2017 1612    TROPONINI 0.03*   No results for input(s): TROPIPOC in the last 168 hours.  BNP Recent Labs Lab 04/14/2017 1612  BNP 254.8*    DDimer No results for input(s): DDIMER in the last 168 hours.  Radiology/Studies:  Dg Chest 2 View  Result Date: 04/10/2017 CLINICAL DATA:  Short of breath.  Lung cancer EXAM: CHEST  2 VIEW COMPARISON:  04/06/2017 FINDINGS: CABG changes. Internal cardiac defibrillator unchanged. Moderate bilateral effusions with bibasilar atelectasis unchanged. Negative for edema. Small right apical pneumothorax unchanged. PICC line removed since the prior study. Apical scarring bilaterally. IMPRESSION: Bilateral pleural effusions and bibasilar atelectasis. No change from the prior study. Negative for edema Probable small right apical pneumothorax unchanged. Electronically Signed   By: Franchot Gallo M.D.   On: 04/15/2017 17:00   Ct Angio Chest Pe W Or Wo Contrast  Result Date: 04/12/2017 CLINICAL DATA:  Complains of dyspnea for 2 days. History of sinus node dysfunction. EXAM: CT ANGIOGRAPHY CHEST WITH CONTRAST TECHNIQUE: Multidetector CT imaging of the chest was performed using the standard protocol during bolus administration of intravenous contrast. Multiplanar CT image reconstructions and MIPs were obtained to evaluate the vascular anatomy. CONTRAST:  80 cc of Isovue 370 COMPARISON:  03/31/2017 FINDINGS: Cardiovascular: There is a left chest wall pacer device with leads in the right atrial appendage and right ventricle. Moderate cardiac enlargement. Previous median sternotomy and CABG procedure. No pericardial effusion identified. The main pulmonary artery appears patent. No saddle embolus. No lobar or segmental pulmonary artery filling defects identified. Mediastinum/Nodes: Fluid identified within the retrosternal space compatible with recent sternotomy. No specific findings identified to suggest abscess. Pre-vascular lymph node Measures 1.5 cm. Increased from 9 mm previously. The  trachea appears patent. Unremarkable appearance of the esophagus. No axillary or supraclavicular adenopathy. Lungs/Pleura: There is a moderate to large left pleural effusion present. Overlying compressive type atelectasis and consolidation involving the left lower lobe and lingula noted. Paramediastinal architectural distortion, fibrosis and scarring noted compatible changes of external beam radiation. There is mild diffuse pleural thickening overlying the right lung. Superimposed airspace consolidation within the right lung base is identified, image 74 of series 8. Unchanged right lung nodule measuring 4 mm, image 44 of series 8. Upper Abdomen: Gallstones are identified measuring up to 1.9 cm. No acute abnormality identified within the upper abdomen. Musculoskeletal: There is multi level degenerative disc disease identified within the thoracic spine. Recent sternotomy defect is again noted. Sternotomy wires remain intact. Review of the MIP images confirms the above findings. IMPRESSION: 1. No evidence for acute pulmonary embolus. 2. Changes compatible with recent median sternotomy with CABG procedure. Findings include retrosternal fluid and unhealed sternotomy defect. 3. There is a moderate to large left pleural effusion. 4. Changes of external beam radiation involve the right lung. Superimposed airspace opacity within the right lung base is noted which is nonspecific. Aspiration or pneumonia cannot be excluded. 5. Gallstones. Electronically Signed   By: Kerby Moors M.D.   On: 04/22/2017 20:47    Assessment and Plan:   Systolic CHF -Intraoperative TEE on 04/01/17 showed EF 35-40%, diffuse hypokinesis with regional variation, inferior severe hypokinesis, mild MR, mild MS.  Prior echo on 01/18/17 showed EF 60-65% and normal wall motion. (previous LV dysfunction with EF 35-40% in 01/2016) -Has dual chamber Medtronic ICD with optivol in place -Maintenance includes lasix 20 mg daily, metoprolol 12.5 mg  bid -Troponin negative,  BNP 254.8 -CXR showed bilateral pleural effusions and bibasilar atelectasis, no change since prior study. Negative for edema. Probable small right apical pneumothorax. -CTA of the chest showed no evidence of acute PE. Findings include retrosternal fluid and unhealed  sternotomy defect. Moderate to large left pleural effusion -Wt down then up 204-->188-->206. Has only had 350 ml urine output per charting.  -Pt appears volume overloaded with lower extremity edema, DOE on minimal exertion, orthopnea and course wheezes.  -Diuresing with IV lasix 40 mg bid. Continue diuresis with increased dose to Lasix 80 mg bid and recheck echo for LV function -Will add low dose ACE-I, lisinopril 2.5 mg daily  Left upper extremity DVT -Continue heparin. Will need oral anticoagulation. Has taken Xarelto in the past and states that he had not problems with it, just did not like being on anticoagulation for atrial fib. Pt understands that he will need anticoagulation for DVT.  CAD -S/P CABG -Continue aspirin, BB, statin  Atrial fibrillation history -Had post op atrial fib after CABG, converted with amiodarone -On amiodarone 200 mg daily, and low dose BB, maintaining sinus rhythm -Has not been anticoagulated due to pancytopenia when admitted for NSTEMI and subsequent CABG -Has Watchman device -Last ICD check on 03/18/17 showed 9.4% AT/F burden. Baseline rhythm is 1st degree AVB  Hypokalemia -K+ 3.3 on admission, 2.8 today. Supplementation per IM. Will add an extra 40 mEq KDur with increase in lasix. (total of 40 mEq tid for today)  Hyperlipidemia -Continue pravachol  Renal insufficiency -Serum creatinine has been elevated since CABG, slowly trending down 1.53--->1.24  Signed, Daune Perch, NP  04/21/2017 12:51 PM   Personally seen and examined. Agree with above.  82 year old male with multiple medical issues with recent bypass surgery early May now with left arm DVT, acute on  chronic systolic heart failure with dilated cardiomyopathy, generalized weakness.  Left arm edema noted, multiple ecchymoses. Regular rate and rhythm, 2/6 systolic ejection murmur, lungs demonstrate basilar left lobe decreased breath sounds.   - Increasing Lasix from 40 mg IV twice a day to 80 mg IV twice a day  - Started low-dose ACE inhibitor, lisinopril 2.5 mg once a day for afterload reduction given his ejection fraction of 30%.  - Continue low-dose beta blocker.  - Currently anticoagulating DVT with heparin IV.   - Consider Xarelto  We will continue to follow along.  Candee Furbish, MD

## 2017-04-21 NOTE — NC FL2 (Signed)
[place] LEVEL OF CARE SCREENING TOOL     IDENTIFICATION  Patient Name: Steven Everett Birthdate: [birth year] Sex: male Admission Date (Current Location): 04/24/2017  [facility] and [place] Number:  [employer] and Address:  [facility] [place] [address] [place], [place] [address]      Provider Number: [id number]  Attending Physician Name and Address:  Patrecia Pour, MD  Relative Name and Phone Number:  Lyndal Rainbow, [phone]     Current Level of Care: Hospital Recommended Level of Care: [place] Prior Approval Number:    Date Approved/Denied:   PASRR Number: [id number]  Discharge Plan: SNF    Current Diagnoses: Patient Active Problem List   Diagnosis Date Noted  . Anemia due to chronic kidney disease 04/21/2017  . Hypokalemia 04/21/2017  . DVT (deep venous thrombosis) ([place]) 04/10/2017  . Coronary artery disease 04/01/2017  . NSTEMI (non-ST elevated myocardial infarction) ([place]) 03/30/2017  . Persistent atrial fibrillation ([place]) 01/31/2016  . IFG (impaired fasting glucose) 05/15/2015  . CKD (chronic kidney disease) 05/15/2015  . ICD (implantable cardioverter-defibrillator) in place 01/10/2015  . PAF (paroxysmal atrial fibrillation) ([place]) 12/14/2013  . Sick sinus syndrome ([place]) 12/14/2013  . Chronic systolic heart failure ([place]) 08/12/2013  . Symptomatic bradycardia 08/12/2013  . Hyperlipidemia 07/20/2013  . CAD (coronary artery disease) 04/06/2013  . Congestive dilated cardiomyopathy ([place]) 01/26/2013  . Aortic stenosis 01/26/2013  . SHOULDER PAIN, RIGHT 02/03/2011  . Benign prostatic hypertrophy with urinary obstruction 07/20/2008  . ADENOMATOUS COLONIC POLYP 01/24/2008  . HEMORRHOIDS, INTERNAL 01/11/2008  . DIVERTICULOSIS, COLON 01/11/2008  . Hepatic cirrhosis ([place]) 11/16/2007  . History of lung cancer 10/06/2007    Class: History of  . SEBORRHEIC DERMATITIS 10/04/2007  . BARRETTS ESOPHAGUS 01/01/2007   . Hypothyroidism 12/22/2006  . GERD 12/22/2006    Orientation RESPIRATION BLADDER Height & Weight     Self, Time, Situation, Place  O2 (nasal cannula 2L/min) Continent Weight: 206 lb 4.8 oz (93.6 kg) Height:  '5\' 10"'$  (177.8 cm)  BEHAVIORAL SYMPTOMS/MOOD NEUROLOGICAL BOWEL NUTRITION STATUS      Continent Diet (please see DC summary)  AMBULATORY STATUS COMMUNICATION OF NEEDS Skin     Verbally Surgical wounds (no dressing, please see DC summary)                       Personal Care Assistance Level of Assistance  Bathing, Feeding, Dressing           Functional Limitations Info  Sight Sight Info: Impaired Hearing Info: Adequate Speech Info: Adequate    SPECIAL CARE FACTORS FREQUENCY  PT (By licensed PT), OT (By licensed OT)     PT Frequency: 5x/week OT Frequency: 5x/week            Contractures Contractures Info: Not present    Additional Factors Info  Code Status, Allergies Code Status Info: Full Allergies Info: Atorvastatin           Current Medications (04/21/2017):  This is the current hospital active medication list Current Facility-Administered Medications  Medication Dose Route Frequency Provider Last Rate Last Dose  . acetaminophen (TYLENOL) tablet 650 mg  650 mg Oral Q6H PRN Fuller Plan A, MD       Or  . acetaminophen (TYLENOL) suppository 650 mg  650 mg Rectal Q6H PRN Fuller Plan A, MD      . alfuzosin (UROXATRAL) 24 hr tablet 10 mg  10 mg Oral Q breakfast  Fuller Plan A, MD   10 mg at 04/21/17 7614  . amiodarone (PACERONE) tablet 200 mg  200 mg Oral Daily Fuller Plan A, MD   200 mg at 04/21/17 0948  . aspirin EC tablet 81 mg  81 mg Oral BID Fuller Plan A, MD   81 mg at 04/21/17 0948  . diphenhydrAMINE (BENADRYL) capsule 25 mg  25 mg Oral QHS PRN Fuller Plan A, MD      . finasteride (PROSCAR) tablet 5 mg  5 mg Oral Daily Smith, Rondell A, MD   5 mg at 04/21/17 0948  . furosemide (LASIX) injection 40 mg  40 mg Intravenous BID  Fuller Plan A, MD   40 mg at 04/21/17 7092  . guaiFENesin (MUCINEX) 12 hr tablet 600 mg  600 mg Oral BID Fuller Plan A, MD   600 mg at 04/21/17 0948  . heparin ADULT infusion 100 units/mL (25000 units/272m sodium chloride 0.45%)  1,200 Units/hr Intravenous Continuous BRomona Curls RPH 12 mL/hr at 04/21/17 1400 1,200 Units/hr at 04/21/17 1400  . ipratropium-albuterol (DUONEB) 0.5-2.5 (3) MG/3ML nebulizer solution 3 mL  3 mL Nebulization Q4H PRN Smith, Rondell A, MD      . levothyroxine (SYNTHROID, LEVOTHROID) tablet 112 mcg  112 mcg Oral QAC breakfast SFuller PlanA, MD   112 mcg at 04/21/17 09574 . metoprolol tartrate (LOPRESSOR) tablet 12.5 mg  12.5 mg Oral BID SFuller PlanA, MD   12.5 mg at 04/21/17 0948  . ondansetron (ZOFRAN) tablet 4 mg  4 mg Oral Q6H PRN SFuller PlanA, MD       Or  . ondansetron (ZOFRAN) injection 4 mg  4 mg Intravenous Q6H PRN Smith, Rondell A, MD      . pantoprazole (PROTONIX) EC tablet 40 mg  40 mg Oral Daily STamala Julian Rondell A, MD   40 mg at 04/21/17 0948  . potassium chloride SA (K-DUR,KLOR-CON) CR tablet 40 mEq  40 mEq Oral BID GPatrecia Pour MD   40 mEq at 04/21/17 07340 . pravastatin (PRAVACHOL) tablet 40 mg  40 mg Oral Daily SFuller PlanA, MD   40 mg at 04/21/17 0948  . traMADol (ULTRAM) tablet 50 mg  50 mg Oral Q6H PRN SNorval Morton MD         Discharge Medications: Please see discharge summary for a list of discharge medications.  Relevant Imaging Results:  Relevant Lab Results:   Additional Information SSN: [id number] SEstanislado Emms LCSW

## 2017-04-22 ENCOUNTER — Ambulatory Visit: Payer: Medicare Other | Admitting: Physician Assistant

## 2017-04-22 ENCOUNTER — Telehealth: Payer: Self-pay | Admitting: Family Medicine

## 2017-04-22 ENCOUNTER — Inpatient Hospital Stay (HOSPITAL_COMMUNITY): Payer: Medicare Other

## 2017-04-22 DIAGNOSIS — I5023 Acute on chronic systolic (congestive) heart failure: Secondary | ICD-10-CM

## 2017-04-22 DIAGNOSIS — I350 Nonrheumatic aortic (valve) stenosis: Secondary | ICD-10-CM

## 2017-04-22 LAB — CBC
HEMATOCRIT: 30 % — AB (ref 39.0–52.0)
HEMOGLOBIN: 8.8 g/dL — AB (ref 13.0–17.0)
MCH: 27.2 pg (ref 26.0–34.0)
MCHC: 29.3 g/dL — ABNORMAL LOW (ref 30.0–36.0)
MCV: 92.9 fL (ref 78.0–100.0)
Platelets: 165 10*3/uL (ref 150–400)
RBC: 3.23 MIL/uL — ABNORMAL LOW (ref 4.22–5.81)
RDW: 15.7 % — ABNORMAL HIGH (ref 11.5–15.5)
WBC: 7.1 10*3/uL (ref 4.0–10.5)

## 2017-04-22 LAB — URINALYSIS, ROUTINE W REFLEX MICROSCOPIC
BILIRUBIN URINE: NEGATIVE
Glucose, UA: NEGATIVE mg/dL
KETONES UR: NEGATIVE mg/dL
NITRITE: NEGATIVE
Protein, ur: NEGATIVE mg/dL
Specific Gravity, Urine: 1.01 (ref 1.005–1.030)
Squamous Epithelial / LPF: NONE SEEN
pH: 6 (ref 5.0–8.0)

## 2017-04-22 LAB — BLOOD GAS, ARTERIAL
ACID-BASE EXCESS: 12.9 mmol/L — AB (ref 0.0–2.0)
Bicarbonate: 38.1 mmol/L — ABNORMAL HIGH (ref 20.0–28.0)
DELIVERY SYSTEMS: POSITIVE
Drawn by: 33100
Expiratory PAP: 7
FIO2: 100
INSPIRATORY PAP: 14
O2 Saturation: 100 %
PH ART: 7.418 (ref 7.350–7.450)
Patient temperature: 97.8
pCO2 arterial: 59.9 mmHg — ABNORMAL HIGH (ref 32.0–48.0)
pO2, Arterial: 430 mmHg — ABNORMAL HIGH (ref 83.0–108.0)

## 2017-04-22 LAB — BASIC METABOLIC PANEL
Anion gap: 6 (ref 5–15)
BUN: 20 mg/dL (ref 6–20)
CALCIUM: 8.6 mg/dL — AB (ref 8.9–10.3)
CO2: 36 mmol/L — AB (ref 22–32)
CREATININE: 1.36 mg/dL — AB (ref 0.61–1.24)
Chloride: 96 mmol/L — ABNORMAL LOW (ref 101–111)
GFR calc non Af Amer: 48 mL/min — ABNORMAL LOW (ref >60)
GFR, EST AFRICAN AMERICAN: 55 mL/min — AB (ref >60)
Glucose, Bld: 137 mg/dL — ABNORMAL HIGH (ref 65–99)
Potassium: 3.5 mmol/L (ref 3.5–5.1)
SODIUM: 138 mmol/L (ref 135–145)

## 2017-04-22 LAB — HEPARIN LEVEL (UNFRACTIONATED)
HEPARIN UNFRACTIONATED: 0.29 [IU]/mL — AB (ref 0.30–0.70)
HEPARIN UNFRACTIONATED: 0.38 [IU]/mL (ref 0.30–0.70)
HEPARIN UNFRACTIONATED: 0.64 [IU]/mL (ref 0.30–0.70)

## 2017-04-22 MED ORDER — SODIUM CHLORIDE 0.9 % IV BOLUS (SEPSIS)
250.0000 mL | Freq: Once | INTRAVENOUS | Status: AC
  Administered 2017-04-22: 250 mL via INTRAVENOUS

## 2017-04-22 MED ORDER — FUROSEMIDE 10 MG/ML IJ SOLN
80.0000 mg | Freq: Three times a day (TID) | INTRAMUSCULAR | Status: DC
  Administered 2017-04-22 – 2017-04-23 (×3): 80 mg via INTRAVENOUS
  Filled 2017-04-22 (×5): qty 8

## 2017-04-22 MED ORDER — FUROSEMIDE 10 MG/ML IJ SOLN
40.0000 mg | Freq: Once | INTRAMUSCULAR | Status: AC
  Administered 2017-04-22: 40 mg via INTRAVENOUS

## 2017-04-22 MED ORDER — CHLORHEXIDINE GLUCONATE 0.12 % MT SOLN
15.0000 mL | Freq: Two times a day (BID) | OROMUCOSAL | Status: DC
  Administered 2017-04-22 – 2017-04-27 (×8): 15 mL via OROMUCOSAL
  Filled 2017-04-22 (×9): qty 15

## 2017-04-22 MED ORDER — ORAL CARE MOUTH RINSE
15.0000 mL | Freq: Two times a day (BID) | OROMUCOSAL | Status: DC
  Administered 2017-04-22 – 2017-04-26 (×9): 15 mL via OROMUCOSAL

## 2017-04-22 NOTE — Progress Notes (Signed)
ANTICOAGULATION CONSULT NOTE - Follow Up Consult  Pharmacy Consult for heparin Indication: DVT  Labs:  Recent Labs  04/07/2017 1612 04/21/17 0242 04/21/17 1119 04/22/17 0235  HGB 10.5*  --   --  8.8*  HCT 34.8*  --   --  30.0*  PLT 204  --   --  165  APTT 33  --   --   --   LABPROT 17.5*  --   --   --   INR 1.42  --   --   --   HEPARINUNFRC  --  0.41 0.30 0.29*  CREATININE 1.34* 1.24  --  1.36*  TROPONINI 0.03*  --   --   --      Assessment: 82yo male now below goal on heparin despite rate increase yesterday; would prefer higher level w/ acute clot; Hgb down but RN notes no signs of bleeding.  Goal of Therapy:  Heparin level 0.3-0.7 units/ml   Plan:  Will increase heparin gtt by 2 units/kg/hr to 1400 units/hr and check level in [place], PharmD, BCPS  04/22/2017,3:23 AM

## 2017-04-22 NOTE — Progress Notes (Signed)
ANTICOAGULATION CONSULT NOTE - Follow Up Consult  Pharmacy Consult for heparin Indication: DVT  Allergies  Allergen Reactions  . Atorvastatin Other (See Comments)    joint aches    Patient Measurements: Height: '5\' 10"'$  (177.8 cm) Weight: 204 lb 12.9 oz (92.9 kg) IBW/kg (Calculated) : 73 Heparin Dosing Weight: 91 kg  Vital Signs: Temp: 97.6 F (36.4 C) (05/23 1934) Temp Source: Oral (05/23 1934) BP: 97/55 (05/23 1800) Pulse Rate: 71 (05/23 1800)  Labs:  Recent Labs  04/16/2017 1612 04/21/17 0242  04/22/17 0235 04/22/17 1025 04/22/17 1914  HGB 10.5*  --   --  8.8*  --   --   HCT 34.8*  --   --  30.0*  --   --   PLT 204  --   --  165  --   --   APTT 33  --   --   --   --   --   LABPROT 17.5*  --   --   --   --   --   INR 1.42  --   --   --   --   --   HEPARINUNFRC  --  0.41  < > 0.29* 0.38 0.64  CREATININE 1.34* 1.24  --  1.36*  --   --   TROPONINI 0.03*  --   --   --   --   --   < > = values in this interval not displayed.  Estimated Creatinine Clearance: 49.6 mL/min (A) (by C-G formula based on SCr of 1.36 mg/dL (H)).   Medications:  Scheduled:  . alfuzosin  10 mg Oral Q breakfast  . amiodarone  200 mg Oral Daily  . aspirin EC  81 mg Oral BID  . chlorhexidine  15 mL Mouth Rinse BID  . finasteride  5 mg Oral Daily  . furosemide  80 mg Intravenous TID  . guaiFENesin  600 mg Oral BID  . levothyroxine  112 mcg Oral QAC breakfast  . mouth rinse  15 mL Mouth Rinse q12n4p  . metoprolol tartrate  12.5 mg Oral BID  . pantoprazole  40 mg Oral Daily  . potassium chloride  40 mEq Oral BID  . pravastatin  40 mg Oral Daily   Infusions:  . heparin 1,400 Units/hr (04/22/17 1534)    Assessment: 82 yo male with DVT is currently on therapeutic heparin.  Heparin level is 0.64  Goal of Therapy:  Heparin level 0.3-0.7 units/ml Monitor platelets by anticoagulation protocol: Yes   Plan:  - continue heparin at 1400 units/hr - daily heparin level and CBC  Erna Brossard,  Tsz-Yin 04/22/2017,8:23 PM

## 2017-04-22 NOTE — Progress Notes (Signed)
Patient is currently on 4LNC with sats of 99%. Patient is resting comfortably and is in no distress. BIPAP is in room on standby but is not needed at this time.

## 2017-04-22 NOTE — Progress Notes (Signed)
  Echocardiogram 2D Echocardiogram has been performed.  Steven Everett 04/22/2017, 3:41 PM

## 2017-04-22 NOTE — Progress Notes (Signed)
Progress Note  Patient Name: Steven Everett Date of Encounter: 04/22/2017  Primary Cardiologist: Dr. Stanford Breed  Subjective   Pt has increased work of breathing, SOB, and upper and lower extremity swelling. Right upper extremity has now developed extensive ecchymosis.   Inpatient Medications    Scheduled Meds: . alfuzosin  10 mg Oral Q breakfast  . amiodarone  200 mg Oral Daily  . aspirin EC  81 mg Oral BID  . finasteride  5 mg Oral Daily  . furosemide  80 mg Intravenous BID  . guaiFENesin  600 mg Oral BID  . levothyroxine  112 mcg Oral QAC breakfast  . lisinopril  2.5 mg Oral Daily  . metoprolol tartrate  12.5 mg Oral BID  . pantoprazole  40 mg Oral Daily  . potassium chloride  40 mEq Oral BID  . pravastatin  40 mg Oral Daily   Continuous Infusions: . heparin 1,400 Units/hr (04/22/17 0549)   PRN Meds: acetaminophen **OR** acetaminophen, diphenhydrAMINE, ipratropium-albuterol, ondansetron **OR** ondansetron (ZOFRAN) IV, traMADol   Vital Signs    Vitals:   04/21/17 1830 04/21/17 1847 04/21/17 2044 04/22/17 0252  BP: (!) 116/54  (!) 92/50 (!) 99/53  Pulse: 72  91 86  Resp:   18 18  Temp:   98.3 F (36.8 C) 97.8 F (36.6 C)  TempSrc:   Oral Oral  SpO2: 100% 98% 100% 100%  Weight:    203 lb 9.6 oz (92.4 kg)  Height:        Intake/Output Summary (Last 24 hours) at 04/22/17 0947 Last data filed at 04/22/17 0704  Gross per 24 hour  Intake              812 ml  Output             1000 ml  Net             -188 ml   Filed Weights   04/04/2017 2132 04/21/17 0541 04/22/17 0252  Weight: 188 lb 1.6 oz (85.3 kg) 206 lb 4.8 oz (93.6 kg) 203 lb 9.6 oz (92.4 kg)     Physical Exam   General: Well developed, well nourished, male appearing in no acute distress. Head: Normocephalic, atraumatic.  Neck: Supple without bruits, JVD difficult to assess Lungs:  Tachypneic, respirations are labored and using accessory muscles, expiratory wheezing throughout Heart: irregular  rhythm, regular rate, no murmur; no rub. Abdomen: Soft, non-tender, non-distended with normoactive bowel sounds. No hepatomegaly. No rebound/guarding. No obvious abdominal masses. Extremities: No clubbing, cyanosis, 2+ edema in B upper and lower extremities. Legs wrapped, pulses not assessed at time of exam Neuro: Alert and oriented X 3. Moves all extremities spontaneously. Psych: Normal affect.  Labs    Chemistry Recent Labs Lab 04/23/2017 1612 04/21/17 0242 04/22/17 0235  NA 139 140 138  K 3.3* 2.8* 3.5  CL 96* 95* 96*  CO2 35* 37* 36*  GLUCOSE 172* 119* 137*  BUN 23* 18 20  CREATININE 1.34* 1.24 1.36*  CALCIUM 8.6* 8.6* 8.6*  PROT  --  5.5*  --   ALBUMIN  --  2.8*  --   AST  --  32  --   ALT  --  40  --   ALKPHOS  --  83  --   BILITOT  --  0.8  --   GFRNONAA 48* 53* 48*  GFRAA 56* >60 55*  ANIONGAP '8 8 6     '$ Hematology Recent Labs Lab 04/14/2017 1612 04/22/17 0235  WBC 9.2 7.1  RBC 3.76* 3.23*  HGB 10.5* 8.8*  HCT 34.8* 30.0*  MCV 92.6 92.9  MCH 27.9 27.2  MCHC 30.2 29.3*  RDW 15.9* 15.7*  PLT 204 165    Cardiac Enzymes Recent Labs Lab 04/11/2017 1612  TROPONINI 0.03*   No results for input(s): TROPIPOC in the last 168 hours.   BNP Recent Labs Lab 04/04/2017 1612  BNP 254.8*     DDimer No results for input(s): DDIMER in the last 168 hours.   Radiology    Dg Chest 2 View  Result Date: 04/27/2017 CLINICAL DATA:  Short of breath.  Lung cancer EXAM: CHEST  2 VIEW COMPARISON:  04/06/2017 FINDINGS: CABG changes. Internal cardiac defibrillator unchanged. Moderate bilateral effusions with bibasilar atelectasis unchanged. Negative for edema. Small right apical pneumothorax unchanged. PICC line removed since the prior study. Apical scarring bilaterally. IMPRESSION: Bilateral pleural effusions and bibasilar atelectasis. No change from the prior study. Negative for edema Probable small right apical pneumothorax unchanged. Electronically Signed   By: Franchot Gallo  M.D.   On: 04/05/2017 17:00   Ct Angio Chest Pe W Or Wo Contrast  Result Date: 04/10/2017 CLINICAL DATA:  Complains of dyspnea for 2 days. History of sinus node dysfunction. EXAM: CT ANGIOGRAPHY CHEST WITH CONTRAST TECHNIQUE: Multidetector CT imaging of the chest was performed using the standard protocol during bolus administration of intravenous contrast. Multiplanar CT image reconstructions and MIPs were obtained to evaluate the vascular anatomy. CONTRAST:  80 cc of Isovue 370 COMPARISON:  03/31/2017 FINDINGS: Cardiovascular: There is a left chest wall pacer device with leads in the right atrial appendage and right ventricle. Moderate cardiac enlargement. Previous median sternotomy and CABG procedure. No pericardial effusion identified. The main pulmonary artery appears patent. No saddle embolus. No lobar or segmental pulmonary artery filling defects identified. Mediastinum/Nodes: Fluid identified within the retrosternal space compatible with recent sternotomy. No specific findings identified to suggest abscess. Pre-vascular lymph node Measures 1.5 cm. Increased from 9 mm previously. The trachea appears patent. Unremarkable appearance of the esophagus. No axillary or supraclavicular adenopathy. Lungs/Pleura: There is a moderate to large left pleural effusion present. Overlying compressive type atelectasis and consolidation involving the left lower lobe and lingula noted. Paramediastinal architectural distortion, fibrosis and scarring noted compatible changes of external beam radiation. There is mild diffuse pleural thickening overlying the right lung. Superimposed airspace consolidation within the right lung base is identified, image 74 of series 8. Unchanged right lung nodule measuring 4 mm, image 44 of series 8. Upper Abdomen: Gallstones are identified measuring up to 1.9 cm. No acute abnormality identified within the upper abdomen. Musculoskeletal: There is multi level degenerative disc disease identified  within the thoracic spine. Recent sternotomy defect is again noted. Sternotomy wires remain intact. Review of the MIP images confirms the above findings. IMPRESSION: 1. No evidence for acute pulmonary embolus. 2. Changes compatible with recent median sternotomy with CABG procedure. Findings include retrosternal fluid and unhealed sternotomy defect. 3. There is a moderate to large left pleural effusion. 4. Changes of external beam radiation involve the right lung. Superimposed airspace opacity within the right lung base is noted which is nonspecific. Aspiration or pneumonia cannot be excluded. 5. Gallstones. Electronically Signed   By: Kerby Moors M.D.   On: 04/19/2017 20:47     Telemetry    Afib with rates in the 80-90s - Personally Reviewed  ECG    04/21/17: Afib with RBBB - Personally Reviewed   Cardiac Studies   Echocardiogram pending  CABG x 2 04/01/17: LIMA to Diagonal, SVG to PDA   Intraop TEE 04/01/17:  Left ventricle: Normal wall thickness. Cavity is mildly dilated. LV systolic function is moderately reduced with an EF of 35-40%. Wall motion is abnormal. Diffuse hypokinesis with regional variation. The inferior wall is severely hypokinetic relative to the remaining segments No thrombus present.  Aortic valve: The valve is trileaflet. Moderate valve thickening present. Moderate valve calcification present. Moderately decreased leaflet separation. Mild stenosis. Mild regurgitation. No AV vegetation.  Right ventricle: Normal wall thickness. Cavity is moderately dilated. Mildly reduced systolic function.  Tricuspid valve: Valve has a dilated annulus. Mild regurgitation. The tricuspid valve regurgitation jet is central.  Left atrium: ASD or PFO closure device in interatrial septum.  Mitral valve: Dilated mitral annulus. Mild leaflet thickening is present. Mild leaflet calcification is present. Mild mitral annular calcification. Mild regurgitation.  Left and Right Heart  Catheterization 03/31/17:  Severe calcific coronary disease.  95% mid RCA stenosis. The RCA arises with a steep shepherd's crook and is heavily calcified from the ostium to the distal vessel. The PDA contains 60-70% ostial narrowing. The right coronary is a very dominant vessel that supplies around the left ventricular apex.  Widely patent small circumflex  Severely diseased very and left anterior descending with a large branching diagonal that contains segmental calcified 80-85% stenosis. The proximal left anterior descending contains eccentric 70% stenosis. The LAD proper is relatively small in caliber and supplies the proximal to mid anterior wall.  Left main contains 30-40% ostial narrowing.  Calcified aortic valve but without significant peak to peak gradient.  Severe left ventricular systolic dysfunction with EF 30%. Normal filling pressures.  RECOMMENDATIONS:   Surgical coronary anatomy given the calcified substrate and significant tortuosity in the right coronary which is the patient's dominant vessel. Given significant comorbidities, surgery may not be possible. If this is the case, consideration of high risk PCI on the right coronary would be a consideration but would have significant technical challenges (guide catheter support, and delivery of an atherectomy device and ultimately stents).    Patient Profile     82 y.o. male with a hx of hypertension, hyperlidemia, GERD/Barrett's esophagus, diet controlled diabetes, hypothyroidism, lung cancer 1998 treated with surgery, chemo and radiation, ICD placement for LV dysfunction 2014, PAF S/P Watchman 01/2016, NSTEMI and subsequent CABG 04/01/2017 who is being seen today for the evaluation of CHF.  Assessment & Plan    1. Acute on chronic systolic heart failure, dilated cardiomyopathy, left pleural effusion - weight is 203 lbs today, from 204 lbs on admission - I&Os are even; urine output low for dosage of lasix given; increased  lasix to 80 mg TID - patient is having increased work of breathing, using accessory muscles and on supplemental oxygen - he now has bilateral upper extremity swelling and continued lower extremity swelling - legs are wrapped halfway to knee, need to be changed today - bilateral pleural effusions - Recommend consulting pulmonary for respiratory status and possible therapeutic thoracentesis   2. Acute kidney injury -sCr 1.36 (1.24), baseline appears 0.7-0.9 - pt has been hypotensive. Hold lisinopril for now while creatinine improves (D/C'ed)   2. LUE DVT - remains on heparin drip; hold xarelto until patient has stabilized - per the patient, his upper extremity swelling is worsening with extensive ecchymosis on right arm   3. CAD - s/p CABG on 04/01/17, stable, no chest pain - continue ASA - troponin negative (04/30/2017)   4. Atrial fibrillation - per  telemetry, Afib is rate controlled in 80-90s - continue lopressor and amiodarone - heparin drip for anticoagulation   5. Pt c/o dysuria today - UA ordered     Signed, Ledora Bottcher , PA-C 9:47 AM 04/22/2017 Pager: [phone]  Personally seen and examined. Agree with above.  He is worse today. Increased work of breathing despite increased lasix to 80 BID. Will change to TID.  Large appearing left pleural effusion.  Exam consistent with this Left arm edema noted.  Smelly urine Recommend consulting pulmonary. ?thoracentesis.  Await echo.  Candee Furbish, MD

## 2017-04-22 NOTE — Significant Event (Signed)
Rapid Response Event Note  Overview: Time Called: 1132 Arrival Time: 4098 Event Type: Respiratory  Initial Focused Assessment: Patient with increased wob and sever sob.   Lung sounds tight, decreased breath sounds Irregular heart tones Skin is mottled, nail bed purple. Large ecchymotic area on right arm (marked) Patient states he feels very short of breath.  He is very weak.  105/60  HR 80s (occ Vpace)  RR 24  O2 sat 86 on [place] RN paging Dr Lorina Rabon at bedside   Interventions: Placed on 100% NRB  O2 Sats 100% Duoneb treatment given Patient wheezes through out, decreased bases. About 15 min post treatment patient again sounds tight with poor air movement. Patient continues to have increased work of breathing and he feels it is difficult to breath.   Placed on Bipap Patient starting to improve, skin color is improving and he is more alert and breathing a little easier. Dr Wendee Beavers at bedside to assess patient. Dr Wendee Beavers spoke with son Order received for [facility] done  Patient transferred to 2h18 as stepdown overflow.   Plan of Care (if not transferred):  Event Summary: Name of Physician Notified: vega at 1130    at    Outcome: Transferred (Comment) [phone])  Event End Time: Webb  Raliegh Ip

## 2017-04-22 NOTE — Progress Notes (Signed)
Patient had increased work of breathing, shortness of breath. Lungs sounded tight and he was less responsive to RN. Dr. Wendee Beavers paged. Rapid response nurse and respiratory therapy at the bedside. Duoneb given. Patient placed on Bipap. Dr. Wendee Beavers ordered chest xray and extra dose of lasix.

## 2017-04-22 NOTE — Progress Notes (Signed)
ANTICOAGULATION CONSULT NOTE - Follow-up Consult  Pharmacy Consult for heparin Indication: DVT  Allergies  Allergen Reactions  . Atorvastatin Other (See Comments)    joint aches    Patient Measurements: Height: '5\' 10"'$  (177.8 cm) Weight: 203 lb 9.6 oz (92.4 kg) IBW/kg (Calculated) : 73 Heparin Dosing Weight: 91.6kg  Vital Signs: Temp: 97.8 F (36.6 C) (05/23 0252) Temp Source: Oral (05/23 0252) BP: 99/53 (05/23 0252) Pulse Rate: 86 (05/23 0252)  Labs:  Recent Labs  04/23/2017 1612  04/21/17 0242 04/21/17 1119 04/22/17 0235 04/22/17 1025  HGB 10.5*  --   --   --  8.8*  --   HCT 34.8*  --   --   --  30.0*  --   PLT 204  --   --   --  165  --   APTT 33  --   --   --   --   --   LABPROT 17.5*  --   --   --   --   --   INR 1.42  --   --   --   --   --   HEPARINUNFRC  --   < > 0.41 0.30 0.29* 0.38  CREATININE 1.34*  --  1.24  --  1.36*  --   TROPONINI 0.03*  --   --   --   --   --   < > = values in this interval not displayed.  Estimated Creatinine Clearance: 49.5 mL/min (A) (by C-G formula based on SCr of 1.36 mg/dL (H)).  Assessment: 68 yof s/p recent CABG presented to the ED with SOB and leg pain. Found to have a DVT. CTA negative for PE. Heparin level therapeutic (0.38) on gtt at 1400 untis/hr. No bleeding documented.  Goal of Therapy:  Heparin level 0.3-0.7 units/ml Monitor platelets by anticoagulation protocol: Yes   Plan:  Heparin at 1400 units/h 6h heparin level to confirm Daily heparin level/CBC Monitor for s/sx bleeding F/u long-term anticoagulation plans   Elicia Lamp, PharmD, BCPS Clinical Pharmacist Rx Phone # for today: [phone] After 3:30PM, please call Main Rx: #[id number] 04/22/2017 11:47 AM

## 2017-04-22 NOTE — Telephone Encounter (Signed)
Call patient: He is still in the hospital to have significant arm swelling and shortness of breath. He's not sure when he'll be discharged home. Just called to check on him and let him know that we were thinking about them.

## 2017-04-22 NOTE — Progress Notes (Signed)
PROGRESS NOTE  Steven Everett  UUV:[id number] DOB: August 27, 1935 DOA: 04/02/2017 PCP: Hali Marry, MD  Outpatient Specialists: Cardiology, Dr. Stanford Breed Cardiothoracic surgery, Dr. Servando Snare  Brief Narrative: Steven Everett is an 82 y.o. male with a history of HTN, HLD, CAD s/p CABG, chronic HFrEF, moderate AS, PAF s/p ICD and watchman, cirrhosis, T2DM, hypothyroidism, and Lung CA who presented to the ED from SNF for left arm swelling.   He reported gradual left arm swelling at SNF while recovering since discharge 5/9 following CABG x2 performed 5/2 by Dr. Servando Snare for NSTEMI. He also had bilateral leg swelling and worsening dyspnea, newly requiring oxygen, with worsening orthopnea. On arrival he was afebrile, tachypneic with creatinine 1.34, troponin 0.03, BNP 254.8. Chest x-ray showed bilateral pleural effusions similar to previous study 5/7 with probable small apical pneumothorax. Left upper extremity doppler showed axillary, subclavin, and possibly jugular vein DVT. CTA chest subsequently ruled out PE. Heparin infusion was started, and IV lasix provided for acute systolic CHF.  Assessment & Plan: Principal Problem:   DVT (deep venous thrombosis) (HCC) Active Problems:   Hypothyroidism   CAD (coronary artery disease)   Hyperlipidemia   PAF (paroxysmal atrial fibrillation) (HCC)   ICD (implantable cardioverter-defibrillator) in place   Anemia due to chronic kidney disease   Hypokalemia   Acute on chronic systolic CHF (congestive heart failure) (HCC)  Acute on chronic systolic CHF: Last EF 47-42% by TEE 5/2, s/p ICD. 200lbs at recent discharge, 206lbs today.  - Lasix increased to 80 mg iv, titrate as needed. Creatinine improved from admission.  - Cardiology consulted - transition to step down given worsening condition. Increased lasix dose. Obtain chest x ray.  Acute DVT of left upper extremity: No PE on CTA chest - Continue heparin gtt for now  Acute hypoxemic respiratory  failure: Due to acute CHF exacerbation, pleural effusions which were present on CXR prior to last DC, possibly contribution from COPD (no formal Dx, but has ~70 pack-year smoking history, quit 1990).  - Continue oxygen prn SpO2 maintain >90%.  - Diuresis as above - Continue duonebs prn dyspnea, wheezing.  Paroxysmal atrial fibrillation s/p watchman: Currently rate controlled.  - Continue amiodarone  CAD s/p CABG 5/2:  - Continue ASA, BB, statin  Hypokalemia: Acute, mild. Patient's initial potassium was noted to be 3.3 on admission and worsened to 2.8 despite repletion in the face of significant lasix dose.  - Replete 49mEq BID and recheck in AM (Mg wnl)  Anemia: Hemoglobin on admission 10.5 , but baseline appears to be around 9-10. - Continue to monitor   Essential hypertension - Continue metoprolol  Chronic kidney disease stage III - Continue to monitor  Hyperlipidemia - Continue atorvastatin   Hypothyroidism - Continue levothyroxine   BPH  - Continue Proscar  History of lung CA: Dx 1998 s/p RML wedge resection and right lower lobectomy, chemo, XRT.   DVT prophylaxis: Therapeutic heparin Code Status: Full Family Communication: Son   Disposition Plan: Inpatient treatment of acute CHF and DVT. Anticipate DC to SNF.   Consultants:   Cardiology  Procedures:  - LUE doppler U/S: Acute deep vein thrombosis noted in the left subclavian and axillary veins. Possible partial thrombus in the left internal jugular vein- difficult to evaluate. - Echocardiogram ordered  Antimicrobials:  None   Subjective: Patient feels more swollen and increased sob.  Objective: Vitals:   04/21/17 1830 04/21/17 1847 04/21/17 2044 04/22/17 0252  BP: (!) 116/54  (!) 92/50 (!) 99/53  Pulse:  72  91 86  Resp:   18 18  Temp:   98.3 F (36.8 C) 97.8 F (36.6 C)  TempSrc:   Oral Oral  SpO2: 100% 98% 100% 100%  Weight:    92.4 kg (203 lb 9.6 oz)  Height:        Intake/Output  Summary (Last 24 hours) at 04/22/17 1130 Last data filed at 04/22/17 0900  Gross per 24 hour  Intake              910 ml  Output             1000 ml  Net              -90 ml   Filed Weights   03/31/2017 2132 04/21/17 0541 04/22/17 0252  Weight: 85.3 kg (188 lb 1.6 oz) 93.6 kg (206 lb 4.8 oz) 92.4 kg (203 lb 9.6 oz)   Examination: General exam: Pleasant elderly male in no distress Respiratory system: Short sentences due to dyspnea on 2L by [place]. Diminished breath sounds L > R  Cardiovascular system: Regular rate and rhythm. No murmur, rub, or gallop. No JVD, and 2+ pedal edema. Gastrointestinal system: Abdomen soft, non-tender, non-distended, with normoactive bowel sounds. No organomegaly or masses felt. Central nervous system: Alert and oriented. No focal neurological deficits. Extremities: Warm, no deformities Skin: Midline sternotomy incision c/d/i. Incompletely healed inferiorly but no dehiscence. Psychiatry: Judgement and insight appear normal. Mood & affect appropriate.   Data Reviewed: I have personally reviewed following labs and imaging studies  CBC:  Recent Labs Lab 04/12/2017 1612 04/22/17 0235  WBC 9.2 7.1  NEUTROABS 7.8*  --   HGB 10.5* 8.8*  HCT 34.8* 30.0*  MCV 92.6 92.9  PLT 204 798   Basic Metabolic Panel:  Recent Labs Lab 04/12/2017 1612 04/21/17 0242 04/22/17 0235  NA 139 140 138  K 3.3* 2.8* 3.5  CL 96* 95* 96*  CO2 35* 37* 36*  GLUCOSE 172* 119* 137*  BUN 23* 18 20  CREATININE 1.34* 1.24 1.36*  CALCIUM 8.6* 8.6* 8.6*  MG  --  2.1  --    GFR: Estimated Creatinine Clearance: 49.5 mL/min (A) (by C-G formula based on SCr of 1.36 mg/dL (H)). Liver Function Tests:  Recent Labs Lab 04/21/17 0242  AST 32  ALT 40  ALKPHOS 83  BILITOT 0.8  PROT 5.5*  ALBUMIN 2.8*   Coagulation Profile:  Recent Labs Lab 04/19/2017 1612  INR 1.42   Cardiac Enzymes:  Recent Labs Lab 04/10/2017 1612  TROPONINI 0.03*   Urine analysis:    Component Value  Date/Time   COLORURINE YELLOW 04/06/2017 2245   APPEARANCEUR HAZY (A) 04/03/2017 2245   LABSPEC 1.020 04/01/2017 2245   PHURINE 5.0 04/24/2017 2245   GLUCOSEU NEGATIVE 04/29/2017 2245   HGBUR LARGE (A) 04/01/2017 2245   BILIRUBINUR NEGATIVE 04/01/2017 2245   BILIRUBINUR neg 02/03/2017 1613   [place] 04/18/2017 2245   PROTEINUR NEGATIVE 04/06/2017 2245   UROBILINOGEN 4.0 02/03/2017 1613   UROBILINOGEN 1.0 06/20/2012 0950   NITRITE NEGATIVE 04/09/2017 2245   LEUKOCYTESUR LARGE (A) 04/21/2017 2245   No results found for this or any previous visit (from the past 240 hour(s)).    Radiology Studies: Dg Chest 2 View  Result Date: 04/16/2017 CLINICAL DATA:  Short of breath.  Lung cancer EXAM: CHEST  2 VIEW COMPARISON:  04/06/2017 FINDINGS: CABG changes. Internal cardiac defibrillator unchanged. Moderate bilateral effusions with bibasilar atelectasis unchanged. Negative for edema. Small right  apical pneumothorax unchanged. PICC line removed since the prior study. Apical scarring bilaterally. IMPRESSION: Bilateral pleural effusions and bibasilar atelectasis. No change from the prior study. Negative for edema Probable small right apical pneumothorax unchanged. Electronically Signed   By: Franchot Gallo M.D.   On: 04/22/2017 17:00   Ct Angio Chest Pe W Or Wo Contrast  Result Date: 04/14/2017 CLINICAL DATA:  Complains of dyspnea for 2 days. History of sinus node dysfunction. EXAM: CT ANGIOGRAPHY CHEST WITH CONTRAST TECHNIQUE: Multidetector CT imaging of the chest was performed using the standard protocol during bolus administration of intravenous contrast. Multiplanar CT image reconstructions and MIPs were obtained to evaluate the vascular anatomy. CONTRAST:  80 cc of Isovue 370 COMPARISON:  03/31/2017 FINDINGS: Cardiovascular: There is a left chest wall pacer device with leads in the right atrial appendage and right ventricle. Moderate cardiac enlargement. Previous median sternotomy and CABG  procedure. No pericardial effusion identified. The main pulmonary artery appears patent. No saddle embolus. No lobar or segmental pulmonary artery filling defects identified. Mediastinum/Nodes: Fluid identified within the retrosternal space compatible with recent sternotomy. No specific findings identified to suggest abscess. Pre-vascular lymph node Measures 1.5 cm. Increased from 9 mm previously. The trachea appears patent. Unremarkable appearance of the esophagus. No axillary or supraclavicular adenopathy. Lungs/Pleura: There is a moderate to large left pleural effusion present. Overlying compressive type atelectasis and consolidation involving the left lower lobe and lingula noted. Paramediastinal architectural distortion, fibrosis and scarring noted compatible changes of external beam radiation. There is mild diffuse pleural thickening overlying the right lung. Superimposed airspace consolidation within the right lung base is identified, image 74 of series 8. Unchanged right lung nodule measuring 4 mm, image 44 of series 8. Upper Abdomen: Gallstones are identified measuring up to 1.9 cm. No acute abnormality identified within the upper abdomen. Musculoskeletal: There is multi level degenerative disc disease identified within the thoracic spine. Recent sternotomy defect is again noted. Sternotomy wires remain intact. Review of the MIP images confirms the above findings. IMPRESSION: 1. No evidence for acute pulmonary embolus. 2. Changes compatible with recent median sternotomy with CABG procedure. Findings include retrosternal fluid and unhealed sternotomy defect. 3. There is a moderate to large left pleural effusion. 4. Changes of external beam radiation involve the right lung. Superimposed airspace opacity within the right lung base is noted which is nonspecific. Aspiration or pneumonia cannot be excluded. 5. Gallstones. Electronically Signed   By: Kerby Moors M.D.   On: 04/19/2017 20:47    Scheduled  Meds: . alfuzosin  10 mg Oral Q breakfast  . amiodarone  200 mg Oral Daily  . aspirin EC  81 mg Oral BID  . finasteride  5 mg Oral Daily  . furosemide  80 mg Intravenous TID  . guaiFENesin  600 mg Oral BID  . levothyroxine  112 mcg Oral QAC breakfast  . metoprolol tartrate  12.5 mg Oral BID  . pantoprazole  40 mg Oral Daily  . potassium chloride  40 mEq Oral BID  . pravastatin  40 mg Oral Daily   Continuous Infusions: . heparin 1,400 Units/hr (04/22/17 0549)     LOS: 1 day   Time spent: 35 minutes.  Velvet Bathe, MD [facility] Pager [phone]  If 7PM-7AM, please contact night-coverage [email or web address] Password [email or web address] 04/22/2017, 11:30 AM

## 2017-04-23 DIAGNOSIS — J9601 Acute respiratory failure with hypoxia: Secondary | ICD-10-CM

## 2017-04-23 DIAGNOSIS — I82A12 Acute embolism and thrombosis of left axillary vein: Secondary | ICD-10-CM

## 2017-04-23 DIAGNOSIS — R0600 Dyspnea, unspecified: Secondary | ICD-10-CM | POA: Insufficient documentation

## 2017-04-23 DIAGNOSIS — I959 Hypotension, unspecified: Secondary | ICD-10-CM | POA: Diagnosis present

## 2017-04-23 DIAGNOSIS — J9600 Acute respiratory failure, unspecified whether with hypoxia or hypercapnia: Secondary | ICD-10-CM | POA: Diagnosis present

## 2017-04-23 LAB — BASIC METABOLIC PANEL
Anion gap: 10 (ref 5–15)
BUN: 22 mg/dL — AB (ref 6–20)
CHLORIDE: 98 mmol/L — AB (ref 101–111)
CO2: 31 mmol/L (ref 22–32)
Calcium: 8.7 mg/dL — ABNORMAL LOW (ref 8.9–10.3)
Creatinine, Ser: 1.38 mg/dL — ABNORMAL HIGH (ref 0.61–1.24)
GFR calc Af Amer: 54 mL/min — ABNORMAL LOW (ref >60)
GFR calc non Af Amer: 47 mL/min — ABNORMAL LOW (ref >60)
GLUCOSE: 127 mg/dL — AB (ref 65–99)
POTASSIUM: 3.8 mmol/L (ref 3.5–5.1)
Sodium: 139 mmol/L (ref 135–145)

## 2017-04-23 LAB — CBC
HEMATOCRIT: 30.9 % — AB (ref 39.0–52.0)
Hemoglobin: 9 g/dL — ABNORMAL LOW (ref 13.0–17.0)
MCH: 27.3 pg (ref 26.0–34.0)
MCHC: 29.1 g/dL — ABNORMAL LOW (ref 30.0–36.0)
MCV: 93.6 fL (ref 78.0–100.0)
PLATELETS: 151 10*3/uL (ref 150–400)
RBC: 3.3 MIL/uL — ABNORMAL LOW (ref 4.22–5.81)
RDW: 15.9 % — ABNORMAL HIGH (ref 11.5–15.5)
WBC: 6.9 10*3/uL (ref 4.0–10.5)

## 2017-04-23 LAB — ECHOCARDIOGRAM COMPLETE
Height: 70 in
Weight: 3276.92 oz

## 2017-04-23 LAB — HEPARIN LEVEL (UNFRACTIONATED): Heparin Unfractionated: 0.69 IU/mL (ref 0.30–0.70)

## 2017-04-23 LAB — LACTIC ACID, PLASMA: LACTIC ACID, VENOUS: 2.9 mmol/L — AB (ref 0.5–1.9)

## 2017-04-23 LAB — PROCALCITONIN

## 2017-04-23 MED ORDER — BUDESONIDE 0.5 MG/2ML IN SUSP
0.5000 mg | Freq: Two times a day (BID) | RESPIRATORY_TRACT | Status: DC
  Administered 2017-04-23: 0.5 mg via RESPIRATORY_TRACT
  Filled 2017-04-23 (×2): qty 2

## 2017-04-23 MED ORDER — ALBUTEROL SULFATE (2.5 MG/3ML) 0.083% IN NEBU
2.5000 mg | INHALATION_SOLUTION | RESPIRATORY_TRACT | Status: DC | PRN
  Administered 2017-04-26 – 2017-04-27 (×2): 2.5 mg via RESPIRATORY_TRACT
  Filled 2017-04-23 (×3): qty 3

## 2017-04-23 MED ORDER — IPRATROPIUM-ALBUTEROL 0.5-2.5 (3) MG/3ML IN SOLN
3.0000 mL | Freq: Four times a day (QID) | RESPIRATORY_TRACT | Status: DC
  Administered 2017-04-23 – 2017-04-24 (×2): 3 mL via RESPIRATORY_TRACT
  Filled 2017-04-23 (×3): qty 3

## 2017-04-23 MED ORDER — SODIUM CHLORIDE 0.9 % IV BOLUS (SEPSIS)
500.0000 mL | Freq: Once | INTRAVENOUS | Status: AC
  Administered 2017-04-23: 500 mL via INTRAVENOUS

## 2017-04-23 MED ORDER — DEXTROSE 5 % IV SOLN
1.0000 g | INTRAVENOUS | Status: DC
  Administered 2017-04-23 – 2017-04-24 (×2): 1 g via INTRAVENOUS
  Filled 2017-04-23 (×2): qty 10

## 2017-04-23 MED ORDER — SODIUM CHLORIDE 0.9 % IV BOLUS (SEPSIS)
250.0000 mL | Freq: Once | INTRAVENOUS | Status: AC
Start: 2017-04-23 — End: 2017-04-23
  Administered 2017-04-23: 250 mL via INTRAVENOUS

## 2017-04-23 NOTE — Progress Notes (Signed)
ANTICOAGULATION CONSULT NOTE - Follow-up Consult  Pharmacy Consult for heparin Indication: DVT  Allergies  Allergen Reactions  . Atorvastatin Other (See Comments)    joint aches    Patient Measurements: Height: 5\' 10"  (177.8 cm) Weight: 204 lb 12.9 oz (92.9 kg) IBW/kg (Calculated) : 73 Heparin Dosing Weight: 91.6kg  Vital Signs: Temp: 98.2 F (36.8 C) (05/24 0700) Temp Source: Oral (05/24 0700) BP: 103/82 (05/24 1000) Pulse Rate: 66 (05/24 1000)  Labs:  Recent Labs  04/09/2017 1612 04/21/17 0242  04/22/17 0235 04/22/17 1025 04/22/17 1914 04/23/17 0300 04/23/17 0708  HGB 10.5*  --   --  8.8*  --   --  9.0*  --   HCT 34.8*  --   --  30.0*  --   --  30.9*  --   PLT 204  --   --  165  --   --  151  --   APTT 33  --   --   --   --   --   --   --   LABPROT 17.5*  --   --   --   --   --   --   --   INR 1.42  --   --   --   --   --   --   --   HEPARINUNFRC  --  0.41  < > 0.29* 0.38 0.64  --  0.69  CREATININE 1.34* 1.24  --  1.36*  --   --  1.38*  --   TROPONINI 0.03*  --   --   --   --   --   --   --   < > = values in this interval not displayed.  Estimated Creatinine Clearance: 48.9 mL/min (A) (by C-G formula based on SCr of 1.38 mg/dL (H)).  Assessment: 37 yof s/p recent CABG presented to the ED with SOB and leg pain. Found to have a DVT. CTA negative for PE. Of note, patient also has AFib w/ watchman device. He was not on anticoagulation prior to admission.   Heparin level therapeutic (0.69) on gtt at 1400 untis/hr at high end of range. Hemoglobin is low/stable and platelet count within normal limits. He has been having some hematuria noted, but anticoagulation is to continue at this time per MD. Plan is to switch patient to Xarelto once decision is made on whether patient will get thoracentesis.  Goal of Therapy:  Heparin level 0.3-0.7 units/ml Monitor platelets by anticoagulation protocol: Yes   Plan:  Decrease heparin slightly to 1350 units/hr IV given  hematuria Daily heparin level/CBC F/u tomorrow (5/24) for thoracentesis decision and whether or not to start [place], PharmD Acute Care Pharmacy Resident  Pager: [phone] 04/23/2017

## 2017-04-23 NOTE — Progress Notes (Signed)
MD paged. BP 70/46. Skains MD verbal order to give another 500 cc bolus of NS and stop his lasix for now. Will administer and monitor closely. Pt refusing bipap at this time. Placed patient on 6 LNC.   Lucius Conn, RN

## 2017-04-23 NOTE — Progress Notes (Signed)
RT note-Bipap placed by nursing due to increase work of breathing.

## 2017-04-23 NOTE — Progress Notes (Signed)
PROGRESS NOTE  Steven Everett  HYW:[id number] DOB: 07/21/1935 DOA: 04/10/2017 PCP: Hali Marry, MD  Outpatient Specialists: Cardiology, Dr. Stanford Breed Cardiothoracic surgery, Dr. Servando Snare  Brief Narrative: Steven Everett is an 82 y.o. male with a history of HTN, HLD, CAD s/p CABG, chronic HFrEF, moderate AS, PAF s/p ICD and watchman, cirrhosis, T2DM, hypothyroidism, and Lung CA who presented to the ED from SNF for left arm swelling.   He reported gradual left arm swelling at SNF while recovering since discharge 5/9 following CABG x2 performed 5/2 by Dr. Servando Snare for NSTEMI. He also had bilateral leg swelling and worsening dyspnea, newly requiring oxygen, with worsening orthopnea. On arrival he was afebrile, tachypneic with creatinine 1.34, troponin 0.03, BNP 254.8. Chest x-ray showed bilateral pleural effusions similar to previous study 5/7 with probable small apical pneumothorax. Left upper extremity doppler showed axillary, subclavin, and possibly jugular vein DVT. CTA chest subsequently ruled out PE. Heparin infusion was started, and IV lasix provided for acute systolic CHF.  Assessment & Plan: Principal Problem:   DVT (deep venous thrombosis) (HCC) Active Problems:   Hypothyroidism   CAD (coronary artery disease)   Hyperlipidemia   PAF (paroxysmal atrial fibrillation) (HCC)   ICD (implantable cardioverter-defibrillator) in place   Anemia due to chronic kidney disease   Hypokalemia   Acute on chronic systolic CHF (congestive heart failure) (HCC)  Acute on chronic systolic CHF: Last EF 48-54% by TEE 5/2, s/p ICD. 200lbs at recent discharge, 206lbs today.  - Lasix increased to 80 mg iv, titrate as needed. Creatinine improved from admission.  - Cardiology consulted - transition to step down given worsening respiratory condition. Improved on increased doses of lasix. Obtained chest x ray which reported moderate effusion.  Acute DVT of left upper extremity: No PE on CTA chest -  Consulted pharmacy for transition to oral anticoagulation  Pleural effusion - Moderate on the left. Given SOB will place order for IR for therapeutic thoracentesis.   Acute hypoxemic respiratory failure: Due to acute CHF exacerbation, pleural effusions which were present on CXR prior to last DC, possibly contribution from COPD (no formal Dx, but has ~70 pack-year smoking history, quit 1990).  - Continue oxygen prn SpO2 maintain >90%.  - Diuresis as above - consult placed for therapeutic thoracentesis - Continue duonebs prn dyspnea, wheezing.  Paroxysmal atrial fibrillation s/p watchman: Currently rate controlled.  - Continue amiodarone  CAD s/p CABG 5/2:  - Continue ASA, BB, statin  Hypokalemia: Acute, mild. Patient's initial potassium was noted to be 3.3 on admission and worsened to 2.8 despite repletion in the face of significant lasix dose.  - Replete 13mEq BID and recheck in AM (Mg wnl)  Anemia: Hemoglobin on admission 10.5 , but baseline appears to be around 9-10. - Continue to monitor   Essential hypertension - Continue metoprolol  Chronic kidney disease stage III - Continue to monitor  Hyperlipidemia - Continue atorvastatin   Hypothyroidism - Continue levothyroxine   BPH  - Continue Proscar  History of lung CA: Dx 1998 s/p RML wedge resection and right lower lobectomy, chemo, XRT.   DVT prophylaxis: Therapeutic heparin Code Status: Full Family Communication: Son   Disposition Plan: Inpatient treatment of acute CHF and DVT. Anticipate DC to SNF with improvement in respiratory condition.   Consultants:   Cardiology  Procedures:  - LUE doppler U/S: Acute deep vein thrombosis noted in the left subclavian and axillary veins. Possible partial thrombus in the left internal jugular vein- difficult to evaluate. -  Echocardiogram ordered  Antimicrobials:  None   Subjective: Patient feels like his respiratory condition has  improved.  Objective: Vitals:   04/23/17 0350 04/23/17 0400 04/23/17 0500 04/23/17 0700  BP:  (!) 87/65 (!) 86/56   Pulse:  68 65   Resp:  (!) 21 (!) 22   Temp: 97.5 F (36.4 C)   98.2 F (36.8 C)  TempSrc: Oral   Oral  SpO2:  100% 97%   Weight:      Height:        Intake/Output Summary (Last 24 hours) at 04/23/17 0835 Last data filed at 04/23/17 0500  Gross per 24 hour  Intake           458.27 ml  Output             1675 ml  Net         -1216.73 ml   Filed Weights   04/21/17 0541 04/22/17 0252 04/22/17 1253  Weight: 93.6 kg (206 lb 4.8 oz) 92.4 kg (203 lb 9.6 oz) 92.9 kg (204 lb 12.9 oz)   Examination: General exam: Pleasant elderly male in no distress Respiratory system: Short sentences due to dyspnea, equal chest rise, decreased breath sounds over left lung field. Cardiovascular system: Regular rate and rhythm. No murmur, rub, or gallop. No JVD, and 2+ pedal edema. Gastrointestinal system: Abdomen soft, non-tender, non-distended, with normoactive bowel sounds. No organomegaly or masses felt. Central nervous system: Alert and oriented. No focal neurological deficits. Extremities: Warm, no deformities Skin: Midline sternotomy incision c/d/i. Incompletely healed inferiorly but no dehiscence. Psychiatry: Judgement and insight appear normal. Mood & affect appropriate.   Data Reviewed: I have personally reviewed following labs and imaging studies  CBC:  Recent Labs Lab 04/15/2017 1612 04/22/17 0235 04/23/17 0300  WBC 9.2 7.1 6.9  NEUTROABS 7.8*  --   --   HGB 10.5* 8.8* 9.0*  HCT 34.8* 30.0* 30.9*  MCV 92.6 92.9 93.6  PLT 204 165 626   Basic Metabolic Panel:  Recent Labs Lab 04/19/2017 1612 04/21/17 0242 04/22/17 0235 04/23/17 0300  NA 139 140 138 139  K 3.3* 2.8* 3.5 3.8  CL 96* 95* 96* 98*  CO2 35* 37* 36* 31  GLUCOSE 172* 119* 137* 127*  BUN 23* 18 20 22*  CREATININE 1.34* 1.24 1.36* 1.38*  CALCIUM 8.6* 8.6* 8.6* 8.7*  MG  --  2.1  --   --     GFR: Estimated Creatinine Clearance: 48.9 mL/min (A) (by C-G formula based on SCr of 1.38 mg/dL (H)). Liver Function Tests:  Recent Labs Lab 04/21/17 0242  AST 32  ALT 40  ALKPHOS 83  BILITOT 0.8  PROT 5.5*  ALBUMIN 2.8*   Coagulation Profile:  Recent Labs Lab 04/21/2017 1612  INR 1.42   Cardiac Enzymes:  Recent Labs Lab 04/25/2017 1612  TROPONINI 0.03*   Urine analysis:    Component Value Date/Time   COLORURINE YELLOW 04/22/2017 1346   APPEARANCEUR HAZY (A) 04/22/2017 1346   LABSPEC 1.010 04/22/2017 1346   PHURINE 6.0 04/22/2017 1346   GLUCOSEU NEGATIVE 04/22/2017 1346   HGBUR MODERATE (A) 04/22/2017 1346   BILIRUBINUR NEGATIVE 04/22/2017 1346   BILIRUBINUR neg 02/03/2017 1613   KETONESUR NEGATIVE 04/22/2017 1346   PROTEINUR NEGATIVE 04/22/2017 1346   UROBILINOGEN 4.0 02/03/2017 1613   UROBILINOGEN 1.0 06/20/2012 0950   NITRITE NEGATIVE 04/22/2017 1346   LEUKOCYTESUR LARGE (A) 04/22/2017 1346   No results found for this or any previous visit (from the past  240 hour(s)).    Radiology Studies: Dg Chest Port 1 View  Result Date: 04/22/2017 CLINICAL DATA:  Shortness of breath EXAM: PORTABLE CHEST 1 VIEW COMPARISON:  04/27/2017 FINDINGS: Bilateral pleural effusion, likely moderate on the left. Post treatment changes to the right chest with volume loss and diaphragm elevation. Stable heart size and mediastinal contours. Status post CABG. Dual-chamber ICD/ pacer from the left. No visible pneumothorax today. IMPRESSION: 1. Stable compared to 2 days prior. 2. Bilateral pleural effusion, moderate on the left. Electronically Signed   By: Monte Fantasia M.D.   On: 04/22/2017 14:29    Scheduled Meds: . alfuzosin  10 mg Oral Q breakfast  . amiodarone  200 mg Oral Daily  . aspirin EC  81 mg Oral BID  . chlorhexidine  15 mL Mouth Rinse BID  . finasteride  5 mg Oral Daily  . furosemide  80 mg Intravenous TID  . guaiFENesin  600 mg Oral BID  . levothyroxine  112 mcg Oral  QAC breakfast  . mouth rinse  15 mL Mouth Rinse q12n4p  . metoprolol tartrate  12.5 mg Oral BID  . pantoprazole  40 mg Oral Daily  . potassium chloride  40 mEq Oral BID  . pravastatin  40 mg Oral Daily   Continuous Infusions: . heparin 1,400 Units/hr (04/23/17 0500)     LOS: 2 days   Time spent: 35 minutes.  Velvet Bathe, MD [facility] Pager [phone]  If 7PM-7AM, please contact night-coverage [email or web address] Password [email or web address] 04/23/2017, 8:35 AM

## 2017-04-23 NOTE — Discharge Instructions (Signed)
Information on my medicine - XARELTO (rivaroxaban)  This medication education was reviewed with me or my healthcare representative as part of my discharge preparation.    WHY WAS XARELTO PRESCRIBED FOR YOU? Xarelto was prescribed to treat blood clots that may have been found in the veins of your legs (deep vein thrombosis) or in your lungs (pulmonary embolism) and to reduce the risk of them occurring again.  What do you need to know about Xarelto? The starting dose is one 15 mg tablet taken TWICE daily with food for the FIRST 21 DAYS then the dose is changed to one 20 mg tablet taken ONCE A DAY with your evening meal.  DO NOT stop taking Xarelto without talking to the health care provider who prescribed the medication.  Refill your prescription for 20 mg tablets before you run out.  After discharge, you should have regular check-up appointments with your healthcare provider that is prescribing your Xarelto.  In the future your dose may need to be changed if your kidney function changes by a significant amount.  What do you do if you miss a dose? If you are taking Xarelto TWICE DAILY and you miss a dose, take it as soon as you remember. You may take two 15 mg tablets (total 30 mg) at the same time then resume your regularly scheduled 15 mg twice daily the next day.  If you are taking Xarelto ONCE DAILY and you miss a dose, take it as soon as you remember on the same day then continue your regularly scheduled once daily regimen the next day. Do not take two doses of Xarelto at the same time.   Important Safety Information Xarelto is a blood thinner medicine that can cause bleeding. You should call your healthcare provider right away if you experience any of the following: ? Bleeding from an injury or your nose that does not stop. ? Unusual colored urine (red or dark [place]) or unusual colored stools (red or black). ? Unusual bruising for unknown reasons. ? A serious fall or if you hit  your head (even if there is no bleeding).  Some medicines may interact with Xarelto and might increase your risk of bleeding while on Xarelto. To help avoid this, consult your healthcare provider or pharmacist prior to using any new prescription or non-prescription medications, including herbals, vitamins, non-steroidal anti-inflammatory drugs (NSAIDs) and supplements.  This website has more information on Xarelto: [email or web address]-benson.[email or web address]

## 2017-04-23 NOTE — Progress Notes (Signed)
   BP now 70's after additional lasix this AM.  Will give back IVF bolus 500 Next step to add dopamine PE CT negative on 5/21  WBC normal, no fever  Candee Furbish, MD

## 2017-04-23 NOTE — Progress Notes (Signed)
Pt BP 89/48 map 57. MD made aware of low BP. MD verbal order keep map greater than 60.  MD verbal order for 250 cc bolus. Will administer and monitor closely.    Lucius Conn, RN

## 2017-04-23 NOTE — Care Management Note (Signed)
Case Management Note Marvetta Gibbons RN, BSN Unit 2W-Case Manager--2H coverage [phone]  Patient Details  Name: BURCH MARCHUK MRN: [id number] Date of Birth: [birth year]  Subjective/Objective:  Pt admitted with DVT                   Action/Plan: PTA pt was at [facility] and [place] consulted for return to SNF when medically stable.   Expected Discharge Date:                  Expected Discharge Plan:  Skilled Nursing Facility  In-House Referral:  Clinical Social Work  Discharge planning Services  CM Consult  Post Acute Care Choice:    Choice offered to:     DME Arranged:    DME Agency:     HH Arranged:    [place] Agency:     Status of Service:  In process, will continue to follow  If discussed at Long Length of Stay Meetings, dates discussed:    Discharge Disposition:   Additional Comments:  Dawayne Patricia, RN 04/23/2017, 11:42 AM

## 2017-04-23 NOTE — Progress Notes (Signed)
eLink Physician-Brief Progress Note Patient Name: Steven Everett DOB: 10/01/1935 MRN: [id number]   Date of Service  04/23/2017  HPI/Events of Note  Low BP. MAP 68 LA 2.9  eICU Interventions  Hold lasix. Monitor BP Elevated LA from cirrhosis? Repeat in AM     Intervention Category Evaluation Type: Other  Champion Corales 04/23/2017, 10:44 PM

## 2017-04-23 NOTE — Consult Note (Signed)
PULMONARY / CRITICAL CARE MEDICINE   Name: Steven Everett MRN: [id number] DOB: 25-Apr-1935    ADMISSION DATE:  04/22/2017 CONSULTATION DATE:  04/23/2017  REFERRING MD:  Dr. Wendee Beavers  CHIEF COMPLAINT:  Hypotension and respiratory distress  HISTORY OF PRESENT ILLNESS:   82 year old male with PMH significant for HTN, HLD, CAD s/p recent CABG s/p ICD, and systolic HF w/LVEF 93-81% (per cath 5/1), mod AS, PAF s/p watchman, cirrhosis, DM type 2, lung ca (1998 tx with right middle lobe wedge resection and right lower lobectomy, chemo and xrt), GERD and hypothyroidism that was admitted on 5/21 with left upper extremity swelling.    He was recently discharge to SNF on 5/9 after recovering from NSTEMI s/p CABG x 2 on 5/2 by Dr. Servando Snare.  During that admission, lovenox was held to pancytopenia.  He additionally developed worsening dyspnea, newly requiring oxygen, bilateral leg swelling, and worsening orthopnea.  He was found to have similar bilateral pleural effusions and probable small apical pneumothorax.  CTA negative for a PE.  He was found to have a DVT of the left upper extremity and left subclavian and axillary veins.  He was placed on heparin drip and diuresed with lasix for acute systolic heart failure.  Since admit, he has been afebrile, with normal WBC.  On 5/23, he developed some shortness of breath and wheezing with improvement on BiPAP, increased dose of lasix and nebs.  He was moved to stepdown for better monitoring. ABG after BiPAP 7.42/60/430 on 1.0 FiO2.  Since, his blood pressure has been liable ranging from SBP 70's to 110.  He has required intermittent BiPAP.  PCCM consulted for ongoing respiratory distress and hypotension.    PAST MEDICAL HISTORY :  He  has a past medical history of Aortic stenosis; Barrett's esophagus; Cancer of right lung ([place]); Chronic systolic dysfunction of left ventricle; Cirrhosis of liver ([place]); Diverticulosis of colon; DVT (deep venous thrombosis) ([place]) (04/02/2017);  GERD (gastroesophageal reflux disease); Hemorrhoids, internal; Hyperlipidemia; Hypertension; Hypothyroidism; Ischemic cardiomyopathy; Paroxysmal atrial fibrillation ([place]) (12/14/2013); Rheumatic fever; Seborrheic dermatitis; Sinus node dysfunction (H[facility]); and Type II diabetes mellitus ([place]).  PAST SURGICAL HISTORY: He  has a past surgical history that includes Lung removal, partial (Right, 1998); Total hip arthroplasty (Right); Fracture surgery; Cataract extraction, bilateral (Bilateral, 8-11); Colonoscopy; TEE without cardioversion (N/A, 02/14/2013); Cardiac pacemaker placement; Cardiac defibrillator placement (08/12/13); implantable cardioverter defibrillator implant (N/A, 08/12/2013); TEE without cardioversion (N/A, 01/09/2016); Cardiac catheterization ("years ago"); Appendectomy (1940s); Joint replacement; Lumbar disc surgery (X 2); Ankle fracture surgery (Right, 1940s); TEE without cardioversion (N/A, 03/20/2016); Left Atrial Appendage Occlusion (N/A, 01/31/2016); Right/Left Heart Cath and Coronary Angiography (N/A, 03/31/2017); Coronary artery bypass graft (N/A, 04/01/2017); and TEE without cardioversion (N/A, 04/01/2017).  Allergies  Allergen Reactions  . Atorvastatin Other (See Comments)    joint aches    No current facility-administered medications on file prior to encounter.    Current Outpatient Prescriptions on File Prior to Encounter  Medication Sig  . alfuzosin (UROXATRAL) 10 MG 24 hr tablet Take 10 mg by mouth daily with breakfast.  . amiodarone (PACERONE) 200 MG tablet Take 1 tablet (200 mg total) by mouth daily.  Marland Kitchen aspirin 81 MG tablet TAKE 1 TABLET  BY MOUTH IN THE MORNING AND 1 TABLET BY MOUTH AT NIGHT.  Marland Kitchen diphenhydrAMINE (BENADRYL) 25 mg capsule Take 1 capsule (25 mg total) by mouth at bedtime as needed for sleep.  Marland Kitchen docusate sodium (COLACE) 100 MG capsule Take 2 capsules (200 mg total) by  mouth daily.  . finasteride (PROSCAR) 5 MG tablet Take 5 mg by mouth daily.  . furosemide (LASIX) 20  MG tablet Take 1 tablet (20 mg total) by mouth daily. (Patient taking differently: Take 40 mg by mouth 2 (two) times daily. )  . ipratropium-albuterol (DUONEB) 0.5-2.5 (3) MG/3ML SOLN Take 3 mLs by nebulization every 6 (six) hours. (Patient taking differently: Take 3 mLs by nebulization every 6 (six) hours as needed (wheezing or SOB). )  . levothyroxine (SYNTHROID, LEVOTHROID) 112 MCG tablet Take 1 tablet (112 mcg total) by mouth daily before breakfast.  . metoprolol tartrate (LOPRESSOR) 25 MG tablet Take 0.5 tablets (12.5 mg total) by mouth 2 (two) times daily.  . potassium chloride (K-DUR) 10 MEQ tablet Take 1 tablet (10 mEq total) by mouth daily.  . pravastatin (PRAVACHOL) 40 MG tablet Take 1 tablet (40 mg total) by mouth daily.  . traMADol (ULTRAM) 50 MG tablet Take 1 tablet (50 mg total) by mouth every 6 (six) hours as needed for moderate pain.  . pantoprazole (PROTONIX) 40 MG tablet Take 1 tablet (40 mg total) by mouth daily before breakfast. (Patient not taking: Reported on 04/09/2017)    FAMILY HISTORY:  Cancer in his father.  Diabetes in his mother.  SOCIAL HISTORY: He  reports that he quit smoking about 28 years ago. His smoking use included Cigarettes. He has a 72.00 pack-year smoking history. He has never used smokeless tobacco. He reports that he does not drink alcohol or use drugs.  REVIEW OF SYSTEMS:  POSITIVES IN BOLD  Gen: Denies fever, chills, weight GAIN, fatigue, night sweats HEENT: Denies blurred vision, double vision, hearing loss, tinnitus, sinus congestion, rhinorrhea, sore throat, neck stiffness, dysphagia PULM: Denies shortness of breath, cough, sputum production, hemoptysis, wheezing CV: Denies chest pain, edema, orthopnea, paroxysmal nocturnal dyspnea, palpitations GI: Denies abdominal pain, nausea, vomiting, diarrhea, hematochezia, melena, constipation, change in bowel habits GU: Denies dysuria, hematuria, polyuria, oliguria, urethral discharge Endocrine: Denies  hot or cold intolerance, polyuria, polyphagia or appetite change Derm: Denies rash, dry skin, scaling or peeling skin change Heme: Denies easy bruising, bleeding, bleeding gums Neuro: Denies headache, numbness, weakness, slurred speech, loss of memory or consciousness   SUBJECTIVE:  Denies any present shortness of breath or chest pain.  Just ate a [employer] frosty.   RN reports improvement in HR (increased from 60 to 80's) and improvement with blood pressure after NS 578ml bolus given per Cards.  VITAL SIGNS: BP (!) 96/51   Pulse 89   Temp 98.2 F (36.8 C)   Resp (!) 28   Ht 5\' 10"  (1.778 m)   Wt 204 lb 12.9 oz (92.9 kg)   SpO2 100%   BMI 29.39 kg/m   HEMODYNAMICS:   VENTILATOR SETTINGS: FiO2 (%):  [30 %] 30 %  INTAKE / OUTPUT: I/O last 3 completed shifts: In: 606.3 [P.O.:120; I.V.:486.3] Out: 2575 [Urine:2575]  PHYSICAL EXAMINATION: General:  Elderly male sitting up in bed in NAD HEENT: MM pink/dry, no jVD Neuro: AAOx3, MAE CV: SR 80, s1s2 rrr, no m/r/g, healed midline incision, ICD pocket in left upper chest PULM: even/non-labored, tachypnea in 20's, lungs clear, diminished bibasilarly, speaks short sentences. No wheezing.  GI: obese, soft, non-tender, bsx4 active  Extremities: warm/dry, generalized 2+ edema, bilateral feet are wrapped Skin: no rashes or lesions, ecchymoses to right arm- borders are marked    LABS:  BMET  Recent Labs Lab 04/21/17 0242 04/22/17 0235 04/23/17 0300  NA 140 138 139  K 2.8*  3.5 3.8  CL 95* 96* 98*  CO2 37* 36* 31  BUN 18 20 22*  CREATININE 1.24 1.36* 1.38*  GLUCOSE 119* 137* 127*    Electrolytes  Recent Labs Lab 04/21/17 0242 04/22/17 0235 04/23/17 0300  CALCIUM 8.6* 8.6* 8.7*  MG 2.1  --   --     CBC  Recent Labs Lab 04/08/2017 1612 04/22/17 0235 04/23/17 0300  WBC 9.2 7.1 6.9  HGB 10.5* 8.8* 9.0*  HCT 34.8* 30.0* 30.9*  PLT 204 165 151    Coag's  Recent Labs Lab 04/11/2017 1612  APTT 33  INR 1.42     Sepsis Markers No results for input(s): LATICACIDVEN, PROCALCITON, O2SATVEN in the last 168 hours.  ABG  Recent Labs Lab 04/22/17 1221  PHART 7.418  PCO2ART 59.9*  PO2ART 430*    Liver Enzymes  Recent Labs Lab 04/21/17 0242  AST 32  ALT 40  ALKPHOS 83  BILITOT 0.8  ALBUMIN 2.8*    Cardiac Enzymes  Recent Labs Lab 04/15/2017 1612  TROPONINI 0.03*    Glucose No results for input(s): GLUCAP in the last 168 hours.  Imaging No results found.   STUDIES:  CXR 5/23 > bilateral effusions, moderate effusion on the left, right diaphragm elevation, stable from 2 days prior TTE 5/23 > LVEF 55-60%, mild LVH, calcified aortic valve w/mild AS (mean gradient 17 mmHg), trace AI, mild TR  CULTURES: UC 5/24 >> [facility] 5/24 >>  ANTIBIOTICS: Rocephin 5/24 for UTI >>  SIGNIFICANT EVENTS: 5/2 CABG x 2 after NSTEMI 5/9 Discharge to SNF 5/21 Readmitted with L DVT, volume overload/ acute systolic HF 9/21 Developed respiratory distress, tx to SDU 5/24 Hypotension, PCCM consult  LINES/TUBES: PIV Foley   DISCUSSION: 58 yoM admitted with acute left upper arm edema, +DVT, r/o PE,  treated with heparin and bilateral pleural effusions  ASSESSMENT / PLAN:  Acute Hypoxic Respiratory Failure-  Likely multifactorial in the setting of bilateral pulmonary effusions, volume overload/ acute systolic heart failure, +/- COPD (PFTs 03/31/17 show severe obstruction Fev1/FVC 66, Fev1 69), not previously requiring O2.  PE ruled out by CTA.  Afebrile, normal WBC- CXR clear. - continue to wean O2 for FiO2 > 92% - BiPAP prn - Maximize with triple therapy w/ duonebs q 6, add pulmicort BID, albuterol prn (will have to reassess if HR/ afib does not tolerate) - continue aggressive pulmonary hygiene w/ IS, mobilize as able w/PT - Agree with thoracentesis as below - If wheezing worsens, caution with continued BB therapy  Acute Hypotension-  Unclear etiology ddx include possible  aggressive diuresis  +/- lopressor (held overnight) vs spesis w/UTI (doesnt meet SIRS criteria though).  Not tachycardic but on BB.   EF has improved.  Remains + on weight, - 1.5L  Fluid responsive at this point.  S/p 510ml NS bolus 5/24 - maintaining BP at this point - tele monitoring - Goal MAP > 65 - could consider passive leg raises, or monitoring w/ Clear Track to assist with further non-invasive hemodynamic monitoring - check PCT, BC, UC, and lactate for completeness   Bilateral Pleural effusions- moderate Left- not much improved with diuresis.  Likely transudative but will need to rule out other etiologies.  - Agree with thoracentesis per IR for therapeutic and diagnostic studies  - please send pleural fluid for studies which have been added to IR order   UTI -  - Rocephin started 5/24 per primary - Follow UC   DVT of left upper extremity-  -  continue heparin per primary team  Acute Systolic Heart Failure - last EF 35-40% by TEE on 5/2.  TTE 5/23 shows improved EF of 55-60%.   - management per primary and Cards  Paroxysmal atrial fibrillation s/p watchman.  Currently SR. On amio and BB - management per primary and Cards.  Remainder per primary team    Best Practices:  DVT prophylaxis: heparin gtt SUP: protonix po Diet: Heart healthy Activity: per PT Full Code   FAMILY  - Updates: Patient and his son updated at the bedside.   My CCT 45 mins  Kennieth Rad, AGACNP-BC [facility] Pgr: [phone] or if no answer [phone] 04/23/2017, 3:22 PM

## 2017-04-23 NOTE — Progress Notes (Signed)
Patient placed on BIPAP for the evening with some hesitation. Tolerating well at this time. RT will continue to monitor.

## 2017-04-23 NOTE — Progress Notes (Signed)
PT Cancellation Note  Patient Details Name: Steven Everett MRN: [id number] DOB: 1935-03-18   Cancelled Treatment:    Reason Eval/Treat Not Completed: Patient declined, no reason specified Pt declined due to feeling "too weak". Pt educated on benefits of increased mobility. PT will continue to follow acutely.    [place] April, PTA Pager: [phone]   04/23/2017, 3:59 PM

## 2017-04-23 NOTE — Progress Notes (Signed)
Progress Note  Patient Name: Steven Everett Date of Encounter: 04/23/2017  Primary Cardiologist: Dr. Stanford Breed, [facility]  Subjective  Once again, worsening breathing. Has been moved to stepdown. No chest pain  Inpatient Medications    Scheduled Meds: . alfuzosin  10 mg Oral Q breakfast  . amiodarone  200 mg Oral Daily  . aspirin EC  81 mg Oral BID  . chlorhexidine  15 mL Mouth Rinse BID  . finasteride  5 mg Oral Daily  . furosemide  80 mg Intravenous TID  . guaiFENesin  600 mg Oral BID  . levothyroxine  112 mcg Oral QAC breakfast  . mouth rinse  15 mL Mouth Rinse q12n4p  . metoprolol tartrate  12.5 mg Oral BID  . pantoprazole  40 mg Oral Daily  . potassium chloride  40 mEq Oral BID  . pravastatin  40 mg Oral Daily   Continuous Infusions: . cefTRIAXone (ROCEPHIN)  IV    . heparin 1,400 Units/hr (04/23/17 0500)   PRN Meds: acetaminophen **OR** acetaminophen, diphenhydrAMINE, ipratropium-albuterol, ondansetron **OR** ondansetron (ZOFRAN) IV, traMADol   Vital Signs    Vitals:   04/23/17 0830 04/23/17 0900 04/23/17 0908 04/23/17 1000  BP: 94/62 109/64 109/64 103/82  Pulse: 66  75 66  Resp: (!) 21 (!) 24  18  Temp:      TempSrc:      SpO2: 99%   100%  Weight:      Height:        Intake/Output Summary (Last 24 hours) at 04/23/17 1044 Last data filed at 04/23/17 0945  Gross per 24 hour  Intake           620.27 ml  Output             1795 ml  Net         -1174.73 ml   Filed Weights   04/21/17 0541 04/22/17 0252 04/22/17 1253  Weight: 206 lb 4.8 oz (93.6 kg) 203 lb 9.6 oz (92.4 kg) 204 lb 12.9 oz (92.9 kg)     Physical Exam   GEN: Well nourished, well developed, in mild respiratory distress  HEENT: normal  Neck: no JVD, carotid bruits, or masses Cardiac: RRR; no murmurs, rubs, or gallops, 1-2+ edema in upper and lower extremities edema  Respiratory: Tachypnea, increased respiratory effort, wheezing GI: soft, nontender, nondistended, + BS  protuberant MS: no deformity or atrophy  Skin: warm and dry, no rash Neuro:  Alert and Oriented x 3, Strength and sensation are intact Psych: euthymic mood, full affect   Labs    Chemistry  Recent Labs Lab 04/21/17 0242 04/22/17 0235 04/23/17 0300  NA 140 138 139  K 2.8* 3.5 3.8  CL 95* 96* 98*  CO2 37* 36* 31  GLUCOSE 119* 137* 127*  BUN 18 20 22*  CREATININE 1.24 1.36* 1.38*  CALCIUM 8.6* 8.6* 8.7*  PROT 5.5*  --   --   ALBUMIN 2.8*  --   --   AST 32  --   --   ALT 40  --   --   ALKPHOS 83  --   --   BILITOT 0.8  --   --   GFRNONAA 53* 48* 47*  GFRAA >60 55* 54*  ANIONGAP 8 6 10      Hematology  Recent Labs Lab 04/19/2017 1612 04/22/17 0235 04/23/17 0300  WBC 9.2 7.1 6.9  RBC 3.76* 3.23* 3.30*  HGB 10.5* 8.8* 9.0*  HCT 34.8* 30.0* 30.9*  MCV 92.6 92.9 93.6  MCH 27.9 27.2 27.3  MCHC 30.2 29.3* 29.1*  RDW 15.9* 15.7* 15.9*  PLT 204 165 151    Cardiac Enzymes  Recent Labs Lab 04/19/2017 1612  TROPONINI 0.03*   No results for input(s): TROPIPOC in the last 168 hours.   BNP  Recent Labs Lab 04/22/2017 1612  BNP 254.8*     DDimer No results for input(s): DDIMER in the last 168 hours.   Radiology    Dg Chest Port 1 View  Result Date: 04/22/2017 CLINICAL DATA:  Shortness of breath EXAM: PORTABLE CHEST 1 VIEW COMPARISON:  04/17/2017 FINDINGS: Bilateral pleural effusion, likely moderate on the left. Post treatment changes to the right chest with volume loss and diaphragm elevation. Stable heart size and mediastinal contours. Status post CABG. Dual-chamber ICD/ pacer from the left. No visible pneumothorax today. IMPRESSION: 1. Stable compared to 2 days prior. 2. Bilateral pleural effusion, moderate on the left. Electronically Signed   By: Monte Fantasia M.D.   On: 04/22/2017 14:29     Telemetry    A. fib heart rates in the 90s - Personally Reviewed  ECG    04/21/17: Afib with RBBB - Personally Reviewed   Cardiac Studies   Echocardiogram  pending  CABG x 2 04/01/17: LIMA to Diagonal, SVG to PDA   Intraop TEE 04/01/17:  Left ventricle: Normal wall thickness. Cavity is mildly dilated. LV systolic function is moderately reduced with an EF of 35-40%. Wall motion is abnormal. Diffuse hypokinesis with regional variation. The inferior wall is severely hypokinetic relative to the remaining segments No thrombus present.  Aortic valve: The valve is trileaflet. Moderate valve thickening present. Moderate valve calcification present. Moderately decreased leaflet separation. Mild stenosis. Mild regurgitation. No AV vegetation.  Right ventricle: Normal wall thickness. Cavity is moderately dilated. Mildly reduced systolic function.  Tricuspid valve: Valve has a dilated annulus. Mild regurgitation. The tricuspid valve regurgitation jet is central.  Left atrium: ASD or PFO closure device in interatrial septum.  Mitral valve: Dilated mitral annulus. Mild leaflet thickening is present. Mild leaflet calcification is present. Mild mitral annular calcification. Mild regurgitation.  Left and Right Heart Catheterization 03/31/17:  Severe calcific coronary disease.  95% mid RCA stenosis. The RCA arises with a steep shepherd's crook and is heavily calcified from the ostium to the distal vessel. The PDA contains 60-70% ostial narrowing. The right coronary is a very dominant vessel that supplies around the left ventricular apex.  Widely patent small circumflex  Severely diseased very and left anterior descending with a large branching diagonal that contains segmental calcified 80-85% stenosis. The proximal left anterior descending contains eccentric 70% stenosis. The LAD proper is relatively small in caliber and supplies the proximal to mid anterior wall.  Left main contains 30-40% ostial narrowing.  Calcified aortic valve but without significant peak to peak gradient.  Severe left ventricular systolic dysfunction with EF 30%. Normal filling  pressures.  RECOMMENDATIONS:   Surgical coronary anatomy given the calcified substrate and significant tortuosity in the right coronary which is the patient's dominant vessel. Given significant comorbidities, surgery may not be possible. If this is the case, consideration of high risk PCI on the right coronary would be a consideration but would have significant technical challenges (guide catheter support, and delivery of an atherectomy device and ultimately stents).    Patient Profile     82 y.o. male with a hx of hypertension, hyperlidemia, GERD/Barrett's esophagus, diet controlled diabetes, hypothyroidism, lung cancer 1998 treated with  surgery, chemo and radiation, ICD placement for LV dysfunction 2014, PAF S/P Watchman 01/2016, NSTEMI and subsequent CABG 04/01/2017 who is being seen today for the evaluation of CHF.  Assessment & Plan    1. Acute on chronic systolic heart failure, dilated cardiomyopathy, left pleural effusion - weight is 204 lbs today no change 204 lbs on admission - lasix to 80 mg IV TID, -1.4 L out in total. - patient is having increased work of breathing, using accessory muscles and on supplemental oxygen - IR for therapeutic thoracentesis - It is likely that he does have underlying COPD, lung disease given his PCO2 of 60.   2. Acute kidney injury -sCr 1.36 (1.24), baseline appears 0.7-0.9 - pt has been hypotensive. Hold lisinopril for now while creatinine improves (D/C'ed), no change   2. LUE DVT - remains on heparin drip; hold xarelto until patient has stabilized - per the patient, his upper extremity swelling is worsening with extensive ecchymosis on right arm, no change   3. CAD - s/p CABG on 04/01/17, stable, no chest pain - continue ASA - troponin negative (04/11/2017), stable   4. Atrial fibrillation - per telemetry, Afib is rate controlled in 80-90s - continue lopressor and amiodarone - heparin drip for anticoagulation, no change   Candee Furbish,  MD

## 2017-04-24 ENCOUNTER — Inpatient Hospital Stay (HOSPITAL_COMMUNITY): Payer: Medicare Other

## 2017-04-24 ENCOUNTER — Encounter (HOSPITAL_COMMUNITY): Payer: Self-pay | Admitting: General Surgery

## 2017-04-24 DIAGNOSIS — I950 Idiopathic hypotension: Secondary | ICD-10-CM

## 2017-04-24 DIAGNOSIS — Z95810 Presence of automatic (implantable) cardiac defibrillator: Secondary | ICD-10-CM

## 2017-04-24 DIAGNOSIS — J810 Acute pulmonary edema: Secondary | ICD-10-CM | POA: Insufficient documentation

## 2017-04-24 HISTORY — PX: IR THORACENTESIS ASP PLEURAL SPACE W/IMG GUIDE: IMG5380

## 2017-04-24 LAB — ALBUMIN, PLEURAL OR PERITONEAL FLUID: ALBUMIN FL: 1.4 g/dL

## 2017-04-24 LAB — CBC
HEMATOCRIT: 30.2 % — AB (ref 39.0–52.0)
HEMOGLOBIN: 8.8 g/dL — AB (ref 13.0–17.0)
MCH: 27.2 pg (ref 26.0–34.0)
MCHC: 29.1 g/dL — AB (ref 30.0–36.0)
MCV: 93.2 fL (ref 78.0–100.0)
Platelets: 160 10*3/uL (ref 150–400)
RBC: 3.24 MIL/uL — ABNORMAL LOW (ref 4.22–5.81)
RDW: 16 % — ABNORMAL HIGH (ref 11.5–15.5)
WBC: 7.2 10*3/uL (ref 4.0–10.5)

## 2017-04-24 LAB — BODY FLUID CELL COUNT WITH DIFFERENTIAL
EOS FL: 0 %
LYMPHS FL: 47 %
MONOCYTE-MACROPHAGE-SEROUS FLUID: 13 % — AB (ref 50–90)
Neutrophil Count, Fluid: 40 % — ABNORMAL HIGH (ref 0–25)
Total Nucleated Cell Count, Fluid: 334 cu mm (ref 0–1000)

## 2017-04-24 LAB — GRAM STAIN

## 2017-04-24 LAB — BASIC METABOLIC PANEL
ANION GAP: 8 (ref 5–15)
BUN: 29 mg/dL — ABNORMAL HIGH (ref 6–20)
CHLORIDE: 98 mmol/L — AB (ref 101–111)
CO2: 34 mmol/L — AB (ref 22–32)
CREATININE: 1.55 mg/dL — AB (ref 0.61–1.24)
Calcium: 8.7 mg/dL — ABNORMAL LOW (ref 8.9–10.3)
GFR calc non Af Amer: 41 mL/min — ABNORMAL LOW (ref >60)
GFR, EST AFRICAN AMERICAN: 47 mL/min — AB (ref >60)
Glucose, Bld: 111 mg/dL — ABNORMAL HIGH (ref 65–99)
Potassium: 4.5 mmol/L (ref 3.5–5.1)
Sodium: 140 mmol/L (ref 135–145)

## 2017-04-24 LAB — PHOSPHORUS: PHOSPHORUS: 3 mg/dL (ref 2.5–4.6)

## 2017-04-24 LAB — PROTEIN, PLEURAL OR PERITONEAL FLUID

## 2017-04-24 LAB — GLUCOSE, CAPILLARY: GLUCOSE-CAPILLARY: 200 mg/dL — AB (ref 65–99)

## 2017-04-24 LAB — MAGNESIUM: MAGNESIUM: 2 mg/dL (ref 1.7–2.4)

## 2017-04-24 LAB — LACTATE DEHYDROGENASE, PLEURAL OR PERITONEAL FLUID: LD FL: 149 U/L — AB (ref 3–23)

## 2017-04-24 LAB — GLUCOSE, PLEURAL OR PERITONEAL FLUID: Glucose, Fluid: 124 mg/dL

## 2017-04-24 LAB — LACTIC ACID, PLASMA: LACTIC ACID, VENOUS: 1.1 mmol/L (ref 0.5–1.9)

## 2017-04-24 LAB — LACTATE DEHYDROGENASE: LDH: 165 U/L (ref 98–192)

## 2017-04-24 LAB — HEPARIN LEVEL (UNFRACTIONATED): Heparin Unfractionated: 0.68 IU/mL (ref 0.30–0.70)

## 2017-04-24 MED ORDER — FUROSEMIDE 10 MG/ML IJ SOLN
INTRAMUSCULAR | Status: AC
  Filled 2017-04-24: qty 4

## 2017-04-24 MED ORDER — IPRATROPIUM-ALBUTEROL 0.5-2.5 (3) MG/3ML IN SOLN
3.0000 mL | RESPIRATORY_TRACT | Status: DC
  Administered 2017-04-24 – 2017-04-25 (×6): 3 mL via RESPIRATORY_TRACT
  Filled 2017-04-24 (×7): qty 3

## 2017-04-24 MED ORDER — CEFTRIAXONE SODIUM 1 G IJ SOLR
1.0000 g | INTRAMUSCULAR | Status: DC
  Administered 2017-04-25 – 2017-04-26 (×2): 1 g via INTRAVENOUS
  Filled 2017-04-24 (×2): qty 10

## 2017-04-24 MED ORDER — METHYLPREDNISOLONE SODIUM SUCC 125 MG IJ SOLR
60.0000 mg | Freq: Four times a day (QID) | INTRAMUSCULAR | Status: DC
  Administered 2017-04-24 – 2017-04-25 (×5): 60 mg via INTRAVENOUS
  Filled 2017-04-24 (×6): qty 2

## 2017-04-24 MED ORDER — FUROSEMIDE 10 MG/ML IJ SOLN
40.0000 mg | Freq: Once | INTRAMUSCULAR | Status: AC
  Administered 2017-04-24: 40 mg via INTRAVENOUS

## 2017-04-24 MED ORDER — FUROSEMIDE 10 MG/ML IJ SOLN
40.0000 mg | Freq: Three times a day (TID) | INTRAMUSCULAR | Status: DC
  Administered 2017-04-24 – 2017-04-25 (×3): 40 mg via INTRAVENOUS
  Filled 2017-04-24 (×3): qty 4

## 2017-04-24 MED ORDER — LIDOCAINE HCL 1 % IJ SOLN
INTRAMUSCULAR | Status: AC
  Filled 2017-04-24: qty 20

## 2017-04-24 NOTE — Progress Notes (Addendum)
Progress Note  Patient Name: Steven Everett Date of Encounter: 04/24/2017  Primary Cardiologist: Dr. Stanford Breed, [facility]  Subjective   Breathing  better this morning after IR for Tap. BP yesterday quite low. IVF bolus. Held PM and AM lasix.   Inpatient Medications    Scheduled Meds: . alfuzosin  10 mg Oral Q breakfast  . amiodarone  200 mg Oral Daily  . aspirin EC  81 mg Oral BID  . budesonide (PULMICORT) nebulizer solution  0.5 mg Nebulization BID  . chlorhexidine  15 mL Mouth Rinse BID  . finasteride  5 mg Oral Daily  . guaiFENesin  600 mg Oral BID  . ipratropium-albuterol  3 mL Nebulization Q6H  . levothyroxine  112 mcg Oral QAC breakfast  . mouth rinse  15 mL Mouth Rinse q12n4p  . pantoprazole  40 mg Oral Daily  . pravastatin  40 mg Oral Daily   Continuous Infusions: . cefTRIAXone (ROCEPHIN)  IV Stopped (04/23/17 1138)  . heparin 1,350 Units/hr (04/24/17 0820)   PRN Meds: acetaminophen **OR** acetaminophen, albuterol, diphenhydrAMINE, ondansetron **OR** ondansetron (ZOFRAN) IV, traMADol   Vital Signs    Vitals:   04/24/17 0600 04/24/17 0700 04/24/17 0800 04/24/17 0813  BP: 101/62 104/62 (!) 102/54   Pulse: 89 88 85   Resp: 18 (!) 22 18   Temp:    98.6 F (37 C)  TempSrc:    Oral  SpO2: 100% 100% 100%   Weight: 207 lb 14.3 oz (94.3 kg)     Height:        Intake/Output Summary (Last 24 hours) at 04/24/17 0913 Last data filed at 04/24/17 0700  Gross per 24 hour  Intake            903.1 ml  Output              725 ml  Net            178.1 ml   Filed Weights   04/22/17 0252 04/22/17 1253 04/24/17 0600  Weight: 203 lb 9.6 oz (92.4 kg) 204 lb 12.9 oz (92.9 kg) 207 lb 14.3 oz (94.3 kg)     Physical Exam   GEN: Well nourished, well developed, in no acute distress  HEENT: normal  Neck: no JVD, carotid bruits, or masses Cardiac: RRR (periods of afib noted); no murmurs, rubs, or gallops, bilateral arm edema  Respiratory:  Improved air movement  bilaterally, normal work of breathing GI: soft, nontender, nondistended, + BS MS: no deformity or atrophy  Skin: warm and dry, no rash Neuro:  Alert and Oriented x 3, Strength and sensation are intact Psych: euthymic mood, full affect    Labs    Chemistry  Recent Labs Lab 04/21/17 0242 04/22/17 0235 04/23/17 0300 04/24/17 0301  NA 140 138 139 140  K 2.8* 3.5 3.8 4.5  CL 95* 96* 98* 98*  CO2 37* 36* 31 34*  GLUCOSE 119* 137* 127* 111*  BUN 18 20 22* 29*  CREATININE 1.24 1.36* 1.38* 1.55*  CALCIUM 8.6* 8.6* 8.7* 8.7*  PROT 5.5*  --   --   --   ALBUMIN 2.8*  --   --   --   AST 32  --   --   --   ALT 40  --   --   --   ALKPHOS 83  --   --   --   BILITOT 0.8  --   --   --   GFRNONAA 53*  48* 47* 41*  GFRAA >60 55* 54* 47*  ANIONGAP 8 6 10 8      Hematology  Recent Labs Lab 04/22/17 0235 04/23/17 0300 04/24/17 0301  WBC 7.1 6.9 7.2  RBC 3.23* 3.30* 3.24*  HGB 8.8* 9.0* 8.8*  HCT 30.0* 30.9* 30.2*  MCV 92.9 93.6 93.2  MCH 27.2 27.3 27.2  MCHC 29.3* 29.1* 29.1*  RDW 15.7* 15.9* 16.0*  PLT 165 151 160    Cardiac Enzymes  Recent Labs Lab 04/08/2017 1612  TROPONINI 0.03*   No results for input(s): TROPIPOC in the last 168 hours.   BNP  Recent Labs Lab 04/26/2017 1612  BNP 254.8*     DDimer No results for input(s): DDIMER in the last 168 hours.   Radiology    Dg Chest Port 1 View  Result Date: 04/22/2017 CLINICAL DATA:  Shortness of breath EXAM: PORTABLE CHEST 1 VIEW COMPARISON:  04/23/2017 FINDINGS: Bilateral pleural effusion, likely moderate on the left. Post treatment changes to the right chest with volume loss and diaphragm elevation. Stable heart size and mediastinal contours. Status post CABG. Dual-chamber ICD/ pacer from the left. No visible pneumothorax today. IMPRESSION: 1. Stable compared to 2 days prior. 2. Bilateral pleural effusion, moderate on the left. Electronically Signed   By: Monte Fantasia M.D.   On: 04/22/2017 14:29     Telemetry      A. fib heart rates in the 80s, prior this AM was sinus tachy, now NSR again - Personally Reviewed  ECG    04/21/17: Afib with RBBB - Personally Reviewed   Cardiac Studies   Echocardiogram 04/22/17 - Left ventricle: The cavity size was normal. Wall thickness was   increased in a pattern of mild LVH. Systolic function was normal.   The estimated ejection fraction was in the range of 55% to 60%.   Wall motion was normal; there were no regional wall motion   abnormalities. - Aortic valve: Valve mobility was restricted. There was mild   stenosis. There was trivial regurgitation. - Mitral valve: Calcified annulus. - Pericardium, extracardiac: There was a pleural effusion.  Impressions:  - Normal LV systolic function; mild LVH; calcified aortic valve   with mild AS (mean gradient 17 mmHg) and trace AI; mild TR.  CABG x 2 04/01/17: LIMA to Diagonal, SVG to PDA   Intraop TEE 04/01/17:  Left ventricle: Normal wall thickness. Cavity is mildly dilated. LV systolic function is moderately reduced with an EF of 35-40%. Wall motion is abnormal. Diffuse hypokinesis with regional variation. The inferior wall is severely hypokinetic relative to the remaining segments No thrombus present.  Aortic valve: The valve is trileaflet. Moderate valve thickening present. Moderate valve calcification present. Moderately decreased leaflet separation. Mild stenosis. Mild regurgitation. No AV vegetation.  Right ventricle: Normal wall thickness. Cavity is moderately dilated. Mildly reduced systolic function.  Tricuspid valve: Valve has a dilated annulus. Mild regurgitation. The tricuspid valve regurgitation jet is central.  Left atrium: ASD or PFO closure device in interatrial septum.  Mitral valve: Dilated mitral annulus. Mild leaflet thickening is present. Mild leaflet calcification is present. Mild mitral annular calcification. Mild regurgitation.  Left and Right Heart Catheterization 03/31/17:  Severe  calcific coronary disease.  95% mid RCA stenosis. The RCA arises with a steep shepherd's crook and is heavily calcified from the ostium to the distal vessel. The PDA contains 60-70% ostial narrowing. The right coronary is a very dominant vessel that supplies around the left ventricular apex.  Widely patent small circumflex  Severely diseased very and left anterior descending with a large branching diagonal that contains segmental calcified 80-85% stenosis. The proximal left anterior descending contains eccentric 70% stenosis. The LAD proper is relatively small in caliber and supplies the proximal to mid anterior wall.  Left main contains 30-40% ostial narrowing.  Calcified aortic valve but without significant peak to peak gradient.  Severe left ventricular systolic dysfunction with EF 30%. Normal filling pressures.  RECOMMENDATIONS:   Surgical coronary anatomy given the calcified substrate and significant tortuosity in the right coronary which is the patient's dominant vessel. Given significant comorbidities, surgery may not be possible. If this is the case, consideration of high risk PCI on the right coronary would be a consideration but would have significant technical challenges (guide catheter support, and delivery of an atherectomy device and ultimately stents).    Patient Profile     82 y.o. male with a hx of hypertension, hyperlidemia, GERD/Barrett's esophagus, diet controlled diabetes, hypothyroidism, lung cancer 1998 treated with surgery, chemo and radiation, ICD placement for LV dysfunction 2014, PAF S/P Watchman 01/2016, NSTEMI and subsequent CABG 04/01/2017 who is being seen today for the evaluation of CHF.  Assessment & Plan    1. Acute on chronic diastolic HF, left pleural effusion, acute resp failure - thankfully his EF is back to normal post CABG, was 35% intraop, now 55% - appreciate CCM consult - weight is 207 lbs today up from 204 lbs on admission - lasix to 80 mg IV  TID was administered originally as pleural effusions were noted and what appeared to be pulmonary vascular congestion was noted on CXR - yesterday he began to struggle more with work of breathing, using accessory muscles and on supplemental oxygen. CCM consulted. Going for thoracentesis this AM. - It is likely that underlying COPD, lung disease given his PCO2 of 60, obstructive PFT's, and pleural effusion are contributing to his current condition more so than his cardiac status (especially with normal EF). We also probably over diuresed with aggressive lasix (hypotension responded well to IVF bolus).    2. Acute kidney injury -sCr 1.5 from 1.36 (1.24), baseline appears 0.7-0.9 - pt has been hypotensive.  - Hold lisinopril for now while creatinine improves (D/C'ed) - Hold metoprolol 12.5 BID as well - Holding lasix  2. LUE DVT - remains on heparin drip; hold xarelto until patient has stabilized - per the patient, his upper extremity swelling is worsening with extensive ecchymosis on right arm - no change   3. CAD - s/p CABG on 04/01/17, stable, no chest pain - continue ASA - troponin negative (04/02/2017) - no angina, stable   4.Parox Atrial fibrillation - per telemetry, Afib is rate controlled in 80-90s this AM but earlier was sinus rhythm, sinus tachy earlier this morning. Has been going in and out of AFIB.  - watch with stopping metoprolol. Add back metoprolol once BP is felt to be stable. - has been on amiodarone since CABG (AFIB noted on 5/3 ECG but soon converted to NSR with AMIO) - heparin drip for anticoagulation, no change - Xarelto once stable.  - Remember has Watchman device  5. ICD in place - working well.  6. Prior lung CA.    Candee Furbish, MD

## 2017-04-24 NOTE — Progress Notes (Signed)
ANTICOAGULATION CONSULT NOTE - Follow-up Consult  Pharmacy Consult for heparin Indication: DVT  Allergies  Allergen Reactions  . Atorvastatin Other (See Comments)    joint aches    Patient Measurements: Height: 5\' 10"  (177.8 cm) Weight: 207 lb 14.3 oz (94.3 kg) IBW/kg (Calculated) : 73 Heparin Dosing Weight: 91.6kg  Vital Signs: Temp: 98.6 F (37 C) (05/25 0813) Temp Source: Oral (05/25 0813) BP: 107/54 (05/25 0962) Pulse Rate: 85 (05/25 0800)  Labs:  Recent Labs  04/22/17 0235  04/22/17 1914 04/23/17 0300 04/23/17 0708 04/24/17 0301  HGB 8.8*  --   --  9.0*  --  8.8*  HCT 30.0*  --   --  30.9*  --  30.2*  PLT 165  --   --  151  --  160  HEPARINUNFRC 0.29*  < > 0.64  --  0.69 0.68  CREATININE 1.36*  --   --  1.38*  --  1.55*  < > = values in this interval not displayed.  Estimated Creatinine Clearance: 43.8 mL/min (A) (by C-G formula based on SCr of 1.55 mg/dL (H)).  Assessment: 42 yof s/p recent CABG presented to the ED with SOB and leg pain. Found to have a DVT. CTA negative for PE. Of note, patient also has AFib w/ watchman device. He was not on anticoagulation prior to admission.   Heparin level therapeutic (0.68) on gtt at 1350 untis/hr at high end of range. Hemoglobin is low/stable and platelet count within normal limits. Patient was having some hematuria noted, but anticoagulation is to continue at this time per MD.    Goal of Therapy:  Heparin level 0.3-0.7 units/ml Monitor platelets by anticoagulation protocol: Yes   Plan:  Continue heparin at 1350 units/hr IV Daily heparin level/CBC Patient to IR this morning for thoracentesis - follow up plans for anticoag afterwards (heparin versus Xarelto and timing of re-start)  Demetrius Charity, PharmD Acute Care Pharmacy Resident  Pager: [phone] 04/24/2017

## 2017-04-24 NOTE — Progress Notes (Signed)
RT attempted to obtain ABG x2 without success. RT will attempt again at a later time. Patient stable. No distress noted at this time.  RT will continue to monitor.

## 2017-04-24 NOTE — Procedures (Signed)
Ultrasound-guided diagnostic and therapeutic left thoracentesis performed yielding 1 liters of serosanguineous colored fluid. No immediate complications.  Procedure terminated due to hypotension and dizziness. Follow-up chest x-ray pending. Anays Detore E 10:30 AM 04/24/2017

## 2017-04-24 NOTE — Progress Notes (Signed)
STAFF NOTE: I, Dr Ann Lions have personally reviewed patient's available data, including medical history, events of note, physical examination and test results as part of my evaluation. I have discussed with resident/NP and other care providers such as pharmacist, RN and RRT.  In addition,  I personally evaluated patient and elicited key findings of   S: LOS 3 days. S/p thora earlier today on left side stopped after 1L due to transient low BP. Then much improved resp status but now again resp distress needing bipap. Care escalated last night fropm SDU to ICU but mainly due to staffing issues. Back on IV heparin gtt. No fever  ecjp 5/23 - no pericardial effuson  O: Looks same like yestrda Mild resp distress Purse lip breathing Crackles and rhonchi++ Abd soft  . Results for Steven Everett, Steven Everett (MRN [id number]) as of 04/24/2017 11:52  Ref. Range 04/24/2017 09:47  Albumin, Fluid Latest Units: g/dL 1.4  Fluid Type-FALB Unknown PLEU LEFT  Glucose, Fluid Latest Units: mg/dL 124  Fluid Type-FGLU Unknown PLEU LEFT  Fluid Type-FLDH Unknown PLEU LEFT  LD, Fluid Latest Ref Range: 3 - 23 U/L 149 (H)  Total protein, fluid Latest Units: g/dL <3.0  Fluid Type-FTP Unknown PLEU LEFT     Recent Labs Lab 04/23/17 0300 04/23/17 1835 04/24/17 0301  LATICACIDVEN  --  2.9* 1.1  PROCALCITON <0.10  --   --     Recent Labs Lab 04/22/17 1221  PHART 7.418  PCO2ART 59.9*  PO2ART 430*  HCO3 38.1*  O2SAT 100.0     A: acute on chronic hypoxemic and hyprecaponic resp failure - seems to have recurrent need for bipap   - baseline copd ? aeopd now  - bialteral effusion - exudative without pericardial effusion- likely post CABG inflammation  - ? Acute diast dysfn - diuresis complicaed by hypotension 5/24 - ? Volume depletion v presence of beta blocker  - normal PCT suggests against infecion  - PE ruled out at admit 04/24/2017    = pos thora resp distress 04/24/2017 12:03 PM - need to rule out REPE or other  complications post thora   LUE and subclavian DVT - on I Vheparin  P:  Check ABG Back on BiPAP 2h on/off in day + QHS Start IV steroids due to ? AECOPD and pleural inflammation DC rocephin Continue lasix to extent needed and rest of  A Fib and CAD magmt per cards - hold beta blocker will diuresis in effect but restart ASAP per cards   Antibiotics Given (last 72 hours)    Date/Time Action Medication Dose Rate   04/23/17 1108 New Bag/Given   cefTRIAXone (ROCEPHIN) 1 g in dextrose 5 % 50 mL IVPB 1 g 100 mL/hr   04/24/17 1028 New Bag/Given   cefTRIAXone (ROCEPHIN) 1 g in dextrose 5 % 50 mL IVPB 1 g 100 mL/hr    Keep in ICU PCCM primary now     .  Rest per NP/medical resident whose note is outlined above and that I agree with  The patient is critically ill with multiple organ systems failure and requires high complexity decision making for assessment and support, frequent evaluation and titration of therapies, application of advanced monitoring technologies and extensive interpretation of multiple databases.   Critical Care Time devoted to patient care services described in this note is  30  Minutes. This time reflects time of care of this signee Dr Brand Males. This critical care time does not reflect procedure time, or teaching time or  supervisory time of PA/NP/Med student/Med Resident etc but could involve care discussion time    Dr. Brand Males, M.D., [facility].C.P Pulmonary and Critical Care Medicine Staff Physician [facility] Pager: [phone], If no answer or between  15:00h - 7:00h: call [phone]  04/24/2017 11:56 AM

## 2017-04-24 NOTE — Plan of Care (Signed)
Problem: Phase I Progression Outcomes Goal: Dyspnea controlled at rest (PE) Outcome: Progressing Pt is currently having no dyspnea at rest. Goal: Hemodynamically stable Outcome: Progressing Pt continues to have hemodynamically stable vital signs.

## 2017-04-24 NOTE — Progress Notes (Signed)
Patient placed on BIPAP for the evening without complication.

## 2017-04-24 NOTE — Consult Note (Signed)
PULMONARY / CRITICAL CARE MEDICINE   Name: Steven Everett MRN: [id number] DOB: 10-10-1935    ADMISSION DATE:  04/16/2017 CONSULTATION DATE:  04/23/2017  REFERRING MD:  Dr. Wendee Beavers  CHIEF COMPLAINT:  Hypotension and respiratory distress  HISTORY OF PRESENT ILLNESS:   82 year old male with PMH significant for HTN, HLD, CAD s/p recent CABG s/p ICD, and systolic HF w/LVEF 02-72% (per cath 5/1), mod AS, PAF s/p watchman, cirrhosis, DM type 2, lung ca (1998 tx with right middle lobe wedge resection and right lower lobectomy, chemo and xrt), GERD and hypothyroidism that was admitted on 5/21 with left upper extremity swelling.    He was recently discharge to SNF on 5/9 after recovering from NSTEMI s/p CABG x 2 on 5/2 by Dr. Servando Snare.  During that admission, lovenox was held to pancytopenia.  He additionally developed worsening dyspnea, newly requiring oxygen, bilateral leg swelling, and worsening orthopnea.  He was found to have similar bilateral pleural effusions and probable small apical pneumothorax.  CTA negative for a PE.  He was found to have a DVT of the left upper extremity and left subclavian and axillary veins.  He was placed on heparin drip and diuresed with lasix for acute systolic heart failure.  Since admit, he has been afebrile, with normal WBC.  On 5/23, he developed some shortness of breath and wheezing with improvement on BiPAP, increased dose of lasix and nebs.  He was moved to stepdown for better monitoring. ABG after BiPAP 7.42/60/430 on 1.0 FiO2.  Since, his blood pressure has been liable ranging from SBP 70's to 110.  He has required intermittent BiPAP.  PCCM consulted for ongoing respiratory distress and hypotension.    Of note, no reports of cirrhosis found on review of [place] from 2000 and CT of abd 2009.    SUBJECTIVE:  BiPAP overnight BP stable, diuretics and lopressor held S/p left thoracentesis per IR yeilding 1L of serosanguineous fluid -> fluid sent/verified Returned from  procedure feeling better, but developed cough, rales, and increasing respiratory distress  VITAL SIGNS: BP 99/70   Pulse 65   Temp 97.9 F (36.6 C) (Axillary)   Resp (!) 27   Ht 5\' 10"  (1.778 m)   Wt 207 lb 14.3 oz (94.3 kg)   SpO2 100%   BMI 29.83 kg/m   HEMODYNAMICS:   VENTILATOR SETTINGS: FiO2 (%):  [30 %-40 %] 40 %  INTAKE / OUTPUT: I/O last 3 completed shifts: In: 1537.4 [P.O.:845; I.V.:692.4] Out: 1420 [Urine:1420]  PHYSICAL EXAMINATION: General:  Chronically ill elderly male sitting upright in bed in mild distress HEENT: MM pink/moist, no JVD Neuro: Alert, follows commands, MAE CV: 63- paced, no m/r/g PULM: even/ labored w/pursed lip breathing, rales/ rhonchi bilaterally, diminished on right  ZD:GUYQI, soft, non-tender, bsx4 active  Extremities: warm/dry, generalized  2+ edema, feet wrapped bilaterally Skin: no rashes or lesions, ecchymoses to right arm   LABS:  BMET  Recent Labs Lab 04/22/17 0235 04/23/17 0300 04/24/17 0301  NA 138 139 140  K 3.5 3.8 4.5  CL 96* 98* 98*  CO2 36* 31 34*  BUN 20 22* 29*  CREATININE 1.36* 1.38* 1.55*  GLUCOSE 137* 127* 111*    Electrolytes  Recent Labs Lab 04/21/17 0242 04/22/17 0235 04/23/17 0300 04/24/17 0301  CALCIUM 8.6* 8.6* 8.7* 8.7*  MG 2.1  --   --  2.0  PHOS  --   --   --  3.0    CBC  Recent Labs Lab 04/22/17  0235 04/23/17 0300 04/24/17 0301  WBC 7.1 6.9 7.2  HGB 8.8* 9.0* 8.8*  HCT 30.0* 30.9* 30.2*  PLT 165 151 160    Coag's  Recent Labs Lab 04/06/2017 1612  APTT 33  INR 1.42    Sepsis Markers  Recent Labs Lab 04/23/17 0300 04/23/17 1835 04/24/17 0301  LATICACIDVEN  --  2.9* 1.1  PROCALCITON <0.10  --   --     ABG  Recent Labs Lab 04/22/17 1221  PHART 7.418  PCO2ART 59.9*  PO2ART 430*    Liver Enzymes  Recent Labs Lab 04/21/17 0242  AST 32  ALT 40  ALKPHOS 83  BILITOT 0.8  ALBUMIN 2.8*    Cardiac Enzymes  Recent Labs Lab 04/27/2017 1612  TROPONINI  0.03*    Glucose No results for input(s): GLUCAP in the last 168 hours.  Imaging Dg Chest 1 View  Result Date: 04/24/2017 CLINICAL DATA:  Post thoracentesis EXAM: CHEST 1 VIEW COMPARISON:  04/22/2017 FINDINGS: Moderate right pleural effusion and small left pleural effusion, similar to prior study. No pneumothorax following left thoracentesis. Left pacer remains in place, unchanged. Heart is borderline in size. Prior CABG. Bibasilar atelectasis. IMPRESSION: Moderate right pleural effusion and small left pleural effusion. No pneumothorax following thoracentesis. Bibasilar atelectasis. Electronically Signed   By: Rolm Baptise M.D.   On: 04/24/2017 10:10   Ir Thoracentesis Asp Pleural Space W/img Guide  Result Date: 04/24/2017 INDICATION: Acute on chronic systolic congestive heart failure with acute respiratory failure and a left pleural effusion noted on recent imaging. Request is made for a diagnostic and therapeutic thoracentesis with a maximum of 1.5 L. EXAM: ULTRASOUND GUIDED DIAGNOSTIC AND THERAPEUTIC THORACENTESIS MEDICATIONS: 1% lidocaine COMPLICATIONS: None immediate. PROCEDURE: An ultrasound guided thoracentesis was thoroughly discussed with the patient and questions answered. The benefits, risks, alternatives and complications were also discussed. The patient understands and wishes to proceed with the procedure. Written consent was obtained. Ultrasound was performed to localize and mark an adequate pocket of fluid in the left chest. The area was then prepped and draped in the normal sterile fashion. 1% Lidocaine was used for local anesthesia. Under ultrasound guidance a Safe-T-Centesis catheter was introduced. Thoracentesis was performed. The procedure was terminated early secondary to symptomatic hypotension. The catheter was removed and a dressing applied. FINDINGS: A total of approximately 1 L of serosanguineous fluid was removed. Samples were sent to the laboratory as requested by the  clinical team. IMPRESSION: Successful ultrasound guided left thoracentesis yielding 1 L of pleural fluid. There is still a minimal amount of fluid noted on postprocedure imaging. The procedure was terminated early secondary to symptomatic hypotension. Read by: Saverio Danker, PA-C Electronically Signed   By: Corrie Mckusick D.O.   On: 04/24/2017 10:33    STUDIES:  US Doppler Upper extremities 5/21 >> acute DVT of the left subclavian and axillary veins; possible partial thrombus of left IJ CXR 5/23 > bilateral effusions, moderate effusion on the left, right diaphragm elevation, stable from 2 days prior TTE 5/23 > LVEF 55-60%, mild LVH, calcified aortic valve w/mild AS (mean gradient 17 mmHg), trace AI, mild TR  CULTURES: UC 5/24 >> e.coli- colonized > [facility] 5/24 >> Left pleural fluid 5/25 >>   ANTIBIOTICS: Rocephin 5/24 for UTI >> 5/25  SIGNIFICANT EVENTS: 5/2 CABG x 2 after NSTEMI 5/9 Discharge to SNF 5/21 Readmitted with L DVT, volume overload/ acute systolic HF 7/51 Developed respiratory distress, tx to SDU 5/24 Hypotension, PCCM consult 5/25 left thoracentesis   LINES/TUBES:  PIV Foley   DISCUSSION: 8 yoM admitted with acute left upper arm edema, +DVT, r/o PE,  treated with heparin and bilateral pleural effusions   ASSESSMENT / PLAN:  PULMONARY A: Acute hypoxic/ hypercapnic respiratory failure  Bilateral Pleural effusions - left moderate Right elevated diaphragm- chronic  Possible Post expansion pulmonary edema s/p thora  +/ - AECOPD  Hx lung CA in 1998 tx with right middle lobe wedge resection and right lower lobectomy, chemo and xrt - Likely multifactorial in the setting of bilateral pulmonary effusions, volume overload/ acute systolic heart failure, +/- COPD (PFTs 03/31/17 show severe obstruction Fev1/FVC 66, Fev1 69), not previously requiring O2.  PE ruled out by CTA.   - s/p Left thoracentesis w/ 1L of serosanguinous fluid, became dizzy/hypotensive in procedure - Exudative  by Lights criteria LDH ratio 0.9  P:   Cancel SDU transfer Continue BiPAP, will need to reassess prior to changing to PRN  ABG in 1 hr CXR now and in am Lasix 40mg  IV once Start solumedrol 5/25 Continue Duonebs, Pulmicort, prn albuterol  Follow pleural studies and cytology Wean FiO2 for sats > 92% Pulmonary hygiene  Caution with BB therapy  CARDIOVASCULAR A:  Hypotension DVT of left subclavian and axillary veins Acute on chronic Systolic HF PAF s/p watchmans CAD s/p CABG 5/2 w/ICD Hx HTN, HLD, mild AS - improvement in EF in 5/23 TTE to 55-60% (no pericardial effusion) - ddx for hypotension d/t BB vs aggressive diuresis, volume responsive to 569ml bolus 5/24 - troponin neg  P:  ICU monitoring Goal MAP > 65 On heparin as below for DVT Cards following, appreciate recs  Continue ASA and Amiodarone  Lasix 40mg  x 1 now as above Start lasix 40mg  TID WITH holding parameters given pulmonary edema Consider clear track monitoring to assist with non-invasive HD if worsens Holding scheduled metoprolol and lisinopril for now   RENAL A:   AKI - sCr baseline appears 0.7-0.9 Lactic acidosis  Hypokalemia Hx CKD stage III BPH - lactic cleared 5/25 - K 4.5 5/25 P:   Trend BMP/ mag/ phos  / urinary output Replace electrolytes as indicated Avoid nephrotoxic agents, ensure adequate renal perfusion Continue proscar  GASTROINTESTINAL A:   No acute issues  P:   Protonix for SUP Diet as tolerated - caution w/respiratory status  HEMATOLOGIC A:   LUE DVT Anemia - of chronic disease  - h/h stable  P:  Heparin gtt per pharmacy Trend CBC   INFECTIOUS A:   ? UTI- appears colonized with E. Coli - normal PCT, afebrile, normal WBC  P:   D/c ceftriaxone  Trend PCT Follow cultures Trend WBC/ fever curve   ENDOCRINE A:   DM type 2  Hypothyroidism  P:   SSI  CBG q 4  Continue levothyroxine  NEUROLOGIC A:   No acute issues P:   Ongoing monitoring in ICU  Hold  sedating meds   FAMILY  - Updates: Patient and son updated at bedside 5/25  - Inter-disciplinary family meet or Palliative Care meeting due by:  6/1.  CCT 60 mins   PCCM will assume primary care as of 5/25.    Kennieth Rad, AGACNP-BC [facility] Pgr: [phone] or if no answer [phone] 04/24/2017, 12:36 PM

## 2017-04-24 NOTE — Progress Notes (Signed)
After thoracentesis patient had worsening in respiratory condition. Per my discussion with critical care team they will take over medical care of the patient.  Once patient is medically stable we would be more than happy to take over care.  Please page flow manager when patient is ready at [phone]  Velvet Bathe

## 2017-04-24 NOTE — Progress Notes (Signed)
PT Cancellation Note  Patient Details Name: Steven Everett MRN: [id number] DOB: 06-Jun-1935   Cancelled Treatment:    Reason Eval/Treat Not Completed: Medical issues which prohibited therapy; RN reports pt going back on bipap for awhile to check back later.  Will attempt as time permits.    Reginia Naas 04/24/2017, 12:04 PM Magda Kiel, [place] 04/24/2017

## 2017-04-25 ENCOUNTER — Inpatient Hospital Stay (HOSPITAL_COMMUNITY): Payer: Medicare Other

## 2017-04-25 ENCOUNTER — Encounter (HOSPITAL_COMMUNITY): Payer: Self-pay | Admitting: Adult Health

## 2017-04-25 LAB — GLUCOSE, CAPILLARY
GLUCOSE-CAPILLARY: 205 mg/dL — AB (ref 65–99)
GLUCOSE-CAPILLARY: 138 mg/dL — AB (ref 65–99)
Glucose-Capillary: 178 mg/dL — ABNORMAL HIGH (ref 65–99)

## 2017-04-25 LAB — PHOSPHORUS: Phosphorus: 3.4 mg/dL (ref 2.5–4.6)

## 2017-04-25 LAB — CBC
HCT: 28.6 % — ABNORMAL LOW (ref 39.0–52.0)
HEMOGLOBIN: 8.6 g/dL — AB (ref 13.0–17.0)
MCH: 27.3 pg (ref 26.0–34.0)
MCHC: 30.1 g/dL (ref 30.0–36.0)
MCV: 90.8 fL (ref 78.0–100.0)
PLATELETS: 138 10*3/uL — AB (ref 150–400)
RBC: 3.15 MIL/uL — ABNORMAL LOW (ref 4.22–5.81)
RDW: 15.7 % — ABNORMAL HIGH (ref 11.5–15.5)
WBC: 4.2 10*3/uL (ref 4.0–10.5)

## 2017-04-25 LAB — BASIC METABOLIC PANEL
Anion gap: 8 (ref 5–15)
BUN: 30 mg/dL — ABNORMAL HIGH (ref 6–20)
CHLORIDE: 98 mmol/L — AB (ref 101–111)
CO2: 31 mmol/L (ref 22–32)
Calcium: 8.8 mg/dL — ABNORMAL LOW (ref 8.9–10.3)
Creatinine, Ser: 1.51 mg/dL — ABNORMAL HIGH (ref 0.61–1.24)
GFR calc Af Amer: 49 mL/min — ABNORMAL LOW (ref >60)
GFR, EST NON AFRICAN AMERICAN: 42 mL/min — AB (ref >60)
Glucose, Bld: 174 mg/dL — ABNORMAL HIGH (ref 65–99)
Potassium: 3.9 mmol/L (ref 3.5–5.1)
SODIUM: 137 mmol/L (ref 135–145)

## 2017-04-25 LAB — ACID FAST SMEAR (AFB, MYCOBACTERIA)

## 2017-04-25 LAB — HEPARIN LEVEL (UNFRACTIONATED)
HEPARIN UNFRACTIONATED: 0.74 [IU]/mL — AB (ref 0.30–0.70)
HEPARIN UNFRACTIONATED: 0.79 [IU]/mL — AB (ref 0.30–0.70)
Heparin Unfractionated: 0.57 IU/mL (ref 0.30–0.70)

## 2017-04-25 LAB — URINE CULTURE: Culture: >=100000 — AB

## 2017-04-25 LAB — ACID FAST SMEAR (AFB): ACID FAST SMEAR - AFSCU2: NEGATIVE

## 2017-04-25 LAB — PROCALCITONIN: Procalcitonin: <0.1 ng/mL

## 2017-04-25 LAB — MAGNESIUM: Magnesium: 2.2 mg/dL (ref 1.7–2.4)

## 2017-04-25 MED ORDER — ALPRAZOLAM 0.25 MG PO TABS
0.2500 mg | ORAL_TABLET | Freq: Once | ORAL | Status: AC
  Administered 2017-04-25: 0.25 mg via ORAL
  Filled 2017-04-25: qty 1

## 2017-04-25 MED ORDER — FUROSEMIDE 10 MG/ML IJ SOLN
40.0000 mg | Freq: Two times a day (BID) | INTRAMUSCULAR | Status: DC
  Administered 2017-04-25 – 2017-04-27 (×4): 40 mg via INTRAVENOUS
  Filled 2017-04-25 (×4): qty 4

## 2017-04-25 MED ORDER — INSULIN ASPART 100 UNIT/ML ~~LOC~~ SOLN
0.0000 [IU] | Freq: Three times a day (TID) | SUBCUTANEOUS | Status: DC
  Administered 2017-04-25: 3 [IU] via SUBCUTANEOUS
  Administered 2017-04-25: 5 [IU] via SUBCUTANEOUS
  Administered 2017-04-26: 3 [IU] via SUBCUTANEOUS
  Administered 2017-04-26: 2 [IU] via SUBCUTANEOUS
  Administered 2017-04-26: 3 [IU] via SUBCUTANEOUS
  Administered 2017-04-27 (×3): 2 [IU] via SUBCUTANEOUS

## 2017-04-25 MED ORDER — IPRATROPIUM-ALBUTEROL 0.5-2.5 (3) MG/3ML IN SOLN
3.0000 mL | Freq: Four times a day (QID) | RESPIRATORY_TRACT | Status: DC
  Administered 2017-04-25 – 2017-04-28 (×7): 3 mL via RESPIRATORY_TRACT
  Filled 2017-04-25 (×8): qty 3

## 2017-04-25 MED ORDER — METHYLPREDNISOLONE SODIUM SUCC 40 MG IJ SOLR
40.0000 mg | Freq: Three times a day (TID) | INTRAMUSCULAR | Status: DC
  Administered 2017-04-25 – 2017-04-26 (×2): 40 mg via INTRAVENOUS
  Filled 2017-04-25 (×2): qty 1

## 2017-04-25 MED ORDER — HEPARIN (PORCINE) IN NACL 100-0.45 UNIT/ML-% IJ SOLN
1100.0000 [IU]/h | INTRAMUSCULAR | Status: DC
  Administered 2017-04-26 (×2): 1100 [IU]/h via INTRAVENOUS
  Filled 2017-04-25 (×4): qty 250

## 2017-04-25 NOTE — Progress Notes (Signed)
RN placed patient on BiPAP for the evening. Patient is tolerating well. RT will continue to monitor.

## 2017-04-25 NOTE — Progress Notes (Signed)
Progress Note  Patient Name: Steven Everett Date of Encounter: 04/25/2017  Primary Cardiologist: [place] conversation. Breathing better. Improved breathing after thoracentesis yesterday. Wondering if this procedure could be repeated.  Inpatient Medications    Scheduled Meds: . alfuzosin  10 mg Oral Q breakfast  . amiodarone  200 mg Oral Daily  . aspirin EC  81 mg Oral BID  . chlorhexidine  15 mL Mouth Rinse BID  . finasteride  5 mg Oral Daily  . furosemide  40 mg Intravenous TID  . ipratropium-albuterol  3 mL Nebulization Q4H  . levothyroxine  112 mcg Oral QAC breakfast  . mouth rinse  15 mL Mouth Rinse q12n4p  . methylPREDNISolone (SOLU-MEDROL) injection  60 mg Intravenous Q6H  . pantoprazole  40 mg Oral Daily  . pravastatin  40 mg Oral Daily   Continuous Infusions: . cefTRIAXone (ROCEPHIN)  IV Stopped (04/25/17 0851)  . heparin 1,250 Units/hr (04/25/17 0800)   PRN Meds: albuterol   Vital Signs    Vitals:   04/25/17 0500 04/25/17 0800 04/25/17 0805 04/25/17 0827  BP: (!) 86/60 (!) 88/63    Pulse: 83 69    Resp: (!) 21 19    Temp:   97.6 F (36.4 C)   TempSrc:   Oral   SpO2: 99%   99%  Weight: 204 lb 5.9 oz (92.7 kg)     Height:        Intake/Output Summary (Last 24 hours) at 04/25/17 0941 Last data filed at 04/25/17 0821  Gross per 24 hour  Intake            961.3 ml  Output             2125 ml  Net          -1163.7 ml   Filed Weights   04/22/17 1253 04/24/17 0600 04/25/17 0500  Weight: 204 lb 12.9 oz (92.9 kg) 207 lb 14.3 oz (94.3 kg) 204 lb 5.9 oz (92.7 kg)    Telemetry    Atrial fibrillation with controlled rate. He is taking amiodarone - Personally Reviewed  ECG    No EKG today - Personally Reviewed  Physical Exam  Chronically ill-appearing GEN: No acute distress.   Neck: No JVD Cardiac: IIRR, no murmurs, rubs, or gallops.. Volume overloaded with edema in feet and arms.  Respiratory: Clear to auscultation  bilaterally. GI: Soft, nontender, non-distended  MS: No edema; No deformity. Neuro:  Nonfocal  Psych: Normal affect   Labs    Chemistry Recent Labs Lab 04/21/17 0242  04/23/17 0300 04/24/17 0301 04/25/17 0434  NA 140  < > 139 140 137  K 2.8*  < > 3.8 4.5 3.9  CL 95*  < > 98* 98* 98*  CO2 37*  < > 31 34* 31  GLUCOSE 119*  < > 127* 111* 174*  BUN 18  < > 22* 29* 30*  CREATININE 1.24  < > 1.38* 1.55* 1.51*  CALCIUM 8.6*  < > 8.7* 8.7* 8.8*  PROT 5.5*  --   --   --   --   ALBUMIN 2.8*  --   --   --   --   AST 32  --   --   --   --   ALT 40  --   --   --   --   ALKPHOS 83  --   --   --   --   BILITOT 0.8  --   --   --   --  GFRNONAA 53*  < > 47* 41* 42*  GFRAA >60  < > 54* 47* 49*  ANIONGAP 8  < > 10 8 8   < > = values in this interval not displayed.   Hematology Recent Labs Lab 04/23/17 0300 04/24/17 0301 04/25/17 0434  WBC 6.9 7.2 4.2  RBC 3.30* 3.24* 3.15*  HGB 9.0* 8.8* 8.6*  HCT 30.9* 30.2* 28.6*  MCV 93.6 93.2 90.8  MCH 27.3 27.2 27.3  MCHC 29.1* 29.1* 30.1  RDW 15.9* 16.0* 15.7*  PLT 151 160 138*    Cardiac Enzymes Recent Labs Lab 04/10/2017 1612  TROPONINI 0.03*   No results for input(s): TROPIPOC in the last 168 hours.   BNP Recent Labs Lab 04/03/2017 1612  BNP 254.8*     DDimer No results for input(s): DDIMER in the last 168 hours.   Radiology    Dg Chest 1 View  Result Date: 04/24/2017 CLINICAL DATA:  Post thoracentesis EXAM: CHEST 1 VIEW COMPARISON:  04/22/2017 FINDINGS: Moderate right pleural effusion and small left pleural effusion, similar to prior study. No pneumothorax following left thoracentesis. Left pacer remains in place, unchanged. Heart is borderline in size. Prior CABG. Bibasilar atelectasis. IMPRESSION: Moderate right pleural effusion and small left pleural effusion. No pneumothorax following thoracentesis. Bibasilar atelectasis. Electronically Signed   By: Rolm Baptise M.D.   On: 04/24/2017 10:10   Dg Chest Port 1  View  Result Date: 04/25/2017 CLINICAL DATA:  Acute respiratory failure EXAM: PORTABLE CHEST 1 VIEW COMPARISON:  Apr 24, 2017 FINDINGS: The cardiomediastinal silhouette is stable. An elevated right hemidiaphragm is identified. Right-sided pleural effusion and underlying opacity is stable. Probable layering effusion and underlying opacity in the left base has worsened. No other interval changes. IMPRESSION: 1. Worsening effusion and opacity in the left base. 2. Stable effusion and opacity on the right. Electronically Signed   By: Dorise Bullion III M.D   On: 04/25/2017 07:27   Dg Chest Port 1 View  Result Date: 04/24/2017 CLINICAL DATA:  Pulmonary edema.  CABG 04/01/2017 EXAM: PORTABLE CHEST 1 VIEW COMPARISON:  04/24/2017 FINDINGS: Moderate right pleural effusion. Small left pleural effusion. Mild bilateral interstitial thickening. No pneumothorax. Stable cardiomediastinal silhouette. Changes from recent CABG. Dual lead cardiac pacemaker. IMPRESSION: Findings concerning for persistent pulmonary edema. Electronically Signed   By: Kathreen Devoid   On: 04/24/2017 12:22   Ir Thoracentesis Asp Pleural Space W/img Guide  Result Date: 04/24/2017 INDICATION: Acute on chronic systolic congestive heart failure with acute respiratory failure and a left pleural effusion noted on recent imaging. Request is made for a diagnostic and therapeutic thoracentesis with a maximum of 1.5 L. EXAM: ULTRASOUND GUIDED DIAGNOSTIC AND THERAPEUTIC THORACENTESIS MEDICATIONS: 1% lidocaine COMPLICATIONS: None immediate. PROCEDURE: An ultrasound guided thoracentesis was thoroughly discussed with the patient and questions answered. The benefits, risks, alternatives and complications were also discussed. The patient understands and wishes to proceed with the procedure. Written consent was obtained. Ultrasound was performed to localize and mark an adequate pocket of fluid in the left chest. The area was then prepped and draped in the normal  sterile fashion. 1% Lidocaine was used for local anesthesia. Under ultrasound guidance a Safe-T-Centesis catheter was introduced. Thoracentesis was performed. The procedure was terminated early secondary to symptomatic hypotension. The catheter was removed and a dressing applied. FINDINGS: A total of approximately 1 L of serosanguineous fluid was removed. Samples were sent to the laboratory as requested by the clinical team. IMPRESSION: Successful ultrasound guided left thoracentesis yielding 1 L  of pleural fluid. There is still a minimal amount of fluid noted on postprocedure imaging. The procedure was terminated early secondary to symptomatic hypotension. Read by: Saverio Danker, PA-C Electronically Signed   By: Corrie Mckusick D.O.   On: 04/24/2017 10:33    Cardiac Studies   None  Patient Profile     82 y.o. male 82 y.o. male with a hx of hypertension, hyperlidemia, GERD/Barrett's esophagus, diet controlled diabetes, hypothyroidism, lung cancer 1998 treated with surgery, chemo and radiation, ICD placement for LV dysfunction 2014, PAF S/P Watchman 01/2016, NSTEMI and subsequent CABG 5/2/2018who is being seen today for the evaluation of CHF. Underwent high risk coronary artery bypass grafting 04/01/17 with LIMA to LAD and SVG to RCA. In convalescence developed DVT of the left upper extremity and left subclavian. This was Complicated by acute systolic heart failure. Now with chronic respiratory failure that is multifactorial.  Assessment & Plan    1. Coronary artery disease, now status post high risk coronary artery bypass grafting. No evidence of ischemia ongoing. 2. Acute on chronic diastolic heart failure, with volume overload. Continue diuresis as tolerated by blood pressure. 3. Atrial fibrillation with rate control on amiodarone. Has Watchman atrial occluding device. 4. DVT of left subclavian vein. On IV heparin 5. Acute kidney injury, stable 6. Amiodarone therapy. Monitor for evidence of  toxicity. 7. Low blood pressure  Signed, Sinclair Grooms, MD  04/25/2017, 9:41 AM

## 2017-04-25 NOTE — Progress Notes (Signed)
[place] for heparin Indication: DVT  Allergies  Allergen Reactions  . Atorvastatin Other (See Comments)    joint aches    Patient Measurements: Height: 5\' 10"  (177.8 cm) Weight: 204 lb 5.9 oz (92.7 kg) IBW/kg (Calculated) : 73 Heparin Dosing Weight: 91.6kg  Vital Signs: Temp: 97.8 F (36.6 C) (05/26 2320) Temp Source: Axillary (05/26 2320) BP: 112/65 (05/26 2200) Pulse Rate: 91 (05/26 2300)  Labs:  Recent Labs  04/23/17 0300  04/24/17 0301 04/25/17 0434 04/25/17 1253 04/25/17 2237  HGB 9.0*  --  8.8* 8.6*  --   --   HCT 30.9*  --  30.2* 28.6*  --   --   PLT 151  --  160 138*  --   --   HEPARINUNFRC  --   < > 0.68 0.74* 0.79* 0.57  CREATININE 1.38*  --  1.55* 1.51*  --   --   < > = values in this interval not displayed.  Estimated Creatinine Clearance: 44.6 mL/min (A) (by C-G formula based on SCr of 1.51 mg/dL (H)).  Assessment: 82 y.o. male with DVT for heparin  Goal of Therapy:  Heparin level 0.3-0.7 units/ml Monitor platelets by anticoagulation protocol: Yes   Plan:  Continue Heparin at current rate Follow-up am labs.   Phillis Knack, PharmD, BCPS   04/25/2017 11:29 PM

## 2017-04-25 NOTE — Progress Notes (Signed)
ANTICOAGULATION CONSULT NOTE - Follow-up Consult  Pharmacy Consult for heparin Indication: DVT  Allergies  Allergen Reactions  . Atorvastatin Other (See Comments)    joint aches    Patient Measurements: Height: 5\' 10"  (177.8 cm) Weight: 207 lb 14.3 oz (94.3 kg) IBW/kg (Calculated) : 73 Heparin Dosing Weight: 91.6kg  Vital Signs: Temp: 97.3 F (36.3 C) (05/26 0335) Temp Source: Axillary (05/26 0335) BP: 91/61 (05/26 0400) Pulse Rate: 76 (05/26 0400)  Labs:  Recent Labs  04/23/17 0300 04/23/17 0708 04/24/17 0301 04/25/17 0434  HGB 9.0*  --  8.8* 8.6*  HCT 30.9*  --  30.2* 28.6*  PLT 151  --  160 138*  HEPARINUNFRC  --  0.69 0.68 0.74*  CREATININE 1.38*  --  1.55*  --     Estimated Creatinine Clearance: 43.8 mL/min (A) (by C-G formula based on SCr of 1.55 mg/dL (H)).  Assessment: 48 yof s/p recent CABG presented to the ED with SOB and leg pain. Found to have a DVT. CTA negative for PE. Of note, patient also has AFib w/ watchman device. He was not on anticoagulation prior to admission.   Heparin level is 0.74 (goal 0.3-0.7) on heparin infusion at 1350 units/hr. Hemoglobin is low/stable and platelet count within normal limits.   Goal of Therapy:  Heparin level 0.3-0.7 units/ml Monitor platelets by anticoagulation protocol: Yes   Plan:  Decrease heparin infusion to 1250 units/hr IV Daily heparin level/CBC  Vincenza Hews, PharmD, BCPS 04/25/2017, 5:21 AM

## 2017-04-25 NOTE — Progress Notes (Signed)
ANTICOAGULATION CONSULT NOTE - Follow-up Consult  Pharmacy Consult for heparin Indication: DVT  Allergies  Allergen Reactions  . Atorvastatin Other (See Comments)    joint aches    Patient Measurements: Height: 5\' 10"  (177.8 cm) Weight: 204 lb 5.9 oz (92.7 kg) IBW/kg (Calculated) : 73 Heparin Dosing Weight: 91.6kg  Vital Signs: Temp: 97.9 F (36.6 C) (05/26 1202) Temp Source: Oral (05/26 1202) BP: 112/66 (05/26 1100) Pulse Rate: 74 (05/26 0900)  Labs:  Recent Labs  04/23/17 0300  04/24/17 0301 04/25/17 0434 04/25/17 1253  HGB 9.0*  --  8.8* 8.6*  --   HCT 30.9*  --  30.2* 28.6*  --   PLT 151  --  160 138*  --   HEPARINUNFRC  --   < > 0.68 0.74* 0.79*  CREATININE 1.38*  --  1.55* 1.51*  --   < > = values in this interval not displayed.  Estimated Creatinine Clearance: 44.6 mL/min (A) (by C-G formula based on SCr of 1.51 mg/dL (H)).  Assessment: 77 yof s/p recent CABG presented to the ED with SOB and leg pain. Found to have a DVT. CTA negative for PE. Of note, patient also has AFib w/ watchman device. He was not on anticoagulation prior to admission.   Heparin level is 0.79 (goal 0.3-0.7) despite heparin gtt rate reduction earlier today. Hemoglobin is low/stable and platelet count within normal limits.   Goal of Therapy:  Heparin level 0.3-0.7 units/ml Monitor platelets by anticoagulation protocol: Yes   Plan:  Decrease heparin infusion to 1100 units/hr. Recheck heparin level in 8 hrs Daily heparin level/CBC  Uvaldo Rising, BCPS  Clinical Pharmacist Pager [phone]  04/25/2017 2:04 PM

## 2017-04-25 NOTE — Progress Notes (Signed)
PULMONARY / CRITICAL CARE MEDICINE   Name: Steven Everett MRN: [id number] DOB: 04-17-1935    ADMISSION DATE:  04/14/2017 CONSULTATION DATE:  04/23/2017  REFERRING MD:  Dr. Wendee Beavers  CHIEF COMPLAINT:  Hypotension and respiratory distress  HISTORY OF PRESENT ILLNESS:   82 year old male with PMH significant for HTN, HLD, CAD s/p recent CABG s/p ICD, and systolic HF w/LVEF 40-98% (per cath 5/1), mod AS, PAF s/p watchman, cirrhosis, DM type 2, lung ca (1998 tx with right middle lobe wedge resection and right lower lobectomy, chemo and xrt), GERD and hypothyroidism that was admitted on 5/21 with left upper extremity swelling.    He was recently discharge to SNF on 5/9 after recovering from NSTEMI s/p CABG x 2 on 5/2 by Dr. Servando Snare.  During that admission, lovenox was held to pancytopenia.  He additionally developed worsening dyspnea, newly requiring oxygen, bilateral leg swelling, and worsening orthopnea.  He was found to have similar bilateral pleural effusions and probable small apical pneumothorax.  CTA negative for a PE.  He was found to have a DVT of the left upper extremity and left subclavian and axillary veins.  He was placed on heparin drip and diuresed with lasix for acute systolic heart failure.  Since admit, he has been afebrile, with normal WBC.  On 5/23, he developed some shortness of breath and wheezing with improvement on BiPAP, increased dose of lasix and nebs.  He was moved to stepdown for better monitoring. ABG after BiPAP 7.42/60/430 on 1.0 FiO2.  Since, his blood pressure has been liable ranging from SBP 70's to 110.  He has required intermittent BiPAP.  PCCM consulted for ongoing respiratory distress and hypotension.    Of note, no reports of cirrhosis found on review of [place] from 2000 and CT of abd 2009.    SUBJECTIVE:  Feeling better, sitting up on side of bed .  Says breathing is doing better. Ate this am good. No nv/d.  Thoracentesis yesterday 5/25 w/ 1 L of fluid removed.   Diuresis with Neg 1L I/O balance   VITAL SIGNS: BP (!) 88/63 (BP Location: Left Arm)   Pulse 69   Temp 97.6 F (36.4 C) (Oral)   Resp 19   Ht 5\' 10"  (1.778 m)   Wt 204 lb 5.9 oz (92.7 kg)   SpO2 99%   BMI 29.32 kg/m   HEMODYNAMICS:   VENTILATOR SETTINGS: FiO2 (%):  [30 %] 30 %  INTAKE / OUTPUT: I/O last 3 completed shifts: In: 1048.3 [P.O.:480; I.V.:458.3; Other:110] Out: 2475 [Urine:2475]  PHYSICAL EXAMINATION: General: chronically ill appearing male sitting up in bed.  HEENT: dry mucosa  Neuro: a/o x 3 , f/c  CV: paced , + Murmur  PULM: rales/rhonchi bilaterally , strong cough  JX:BJYNW, soft nt, BS +  Extremities: warm bilaterally , feet wrapped, gen edema 1-2+ , worse in UE.  Skin: thin skin , scattered ecchymosis   LABS:  BMET  Recent Labs Lab 04/23/17 0300 04/24/17 0301 04/25/17 0434  NA 139 140 137  K 3.8 4.5 3.9  CL 98* 98* 98*  CO2 31 34* 31  BUN 22* 29* 30*  CREATININE 1.38* 1.55* 1.51*  GLUCOSE 127* 111* 174*    Electrolytes  Recent Labs Lab 04/21/17 0242  04/23/17 0300 04/24/17 0301 04/25/17 0434  CALCIUM 8.6*  < > 8.7* 8.7* 8.8*  MG 2.1  --   --  2.0 2.2  PHOS  --   --   --  3.0  3.4  < > = values in this interval not displayed.  CBC  Recent Labs Lab 04/23/17 0300 04/24/17 0301 04/25/17 0434  WBC 6.9 7.2 4.2  HGB 9.0* 8.8* 8.6*  HCT 30.9* 30.2* 28.6*  PLT 151 160 138*    Coag's  Recent Labs Lab 04/12/2017 1612  APTT 33  INR 1.42    Sepsis Markers  Recent Labs Lab 04/23/17 0300 04/23/17 1835 04/24/17 0301 04/25/17 0434  LATICACIDVEN  --  2.9* 1.1  --   PROCALCITON <0.10  --   --  <0.10    ABG  Recent Labs Lab 04/22/17 1221  PHART 7.418  PCO2ART 59.9*  PO2ART 430*    Liver Enzymes  Recent Labs Lab 04/21/17 0242  AST 32  ALT 40  ALKPHOS 83  BILITOT 0.8  ALBUMIN 2.8*    Cardiac Enzymes  Recent Labs Lab 04/17/2017 1612  TROPONINI 0.03*    Glucose  Recent Labs Lab 04/24/17 1957   GLUCAP 200*    Imaging Dg Chest 1 View  Result Date: 04/24/2017 CLINICAL DATA:  Post thoracentesis EXAM: CHEST 1 VIEW COMPARISON:  04/22/2017 FINDINGS: Moderate right pleural effusion and small left pleural effusion, similar to prior study. No pneumothorax following left thoracentesis. Left pacer remains in place, unchanged. Heart is borderline in size. Prior CABG. Bibasilar atelectasis. IMPRESSION: Moderate right pleural effusion and small left pleural effusion. No pneumothorax following thoracentesis. Bibasilar atelectasis. Electronically Signed   By: Rolm Baptise M.D.   On: 04/24/2017 10:10   Dg Chest Port 1 View  Result Date: 04/25/2017 CLINICAL DATA:  Acute respiratory failure EXAM: PORTABLE CHEST 1 VIEW COMPARISON:  Apr 24, 2017 FINDINGS: The cardiomediastinal silhouette is stable. An elevated right hemidiaphragm is identified. Right-sided pleural effusion and underlying opacity is stable. Probable layering effusion and underlying opacity in the left base has worsened. No other interval changes. IMPRESSION: 1. Worsening effusion and opacity in the left base. 2. Stable effusion and opacity on the right. Electronically Signed   By: Dorise Bullion III M.D   On: 04/25/2017 07:27   Dg Chest Port 1 View  Result Date: 04/24/2017 CLINICAL DATA:  Pulmonary edema.  CABG 04/01/2017 EXAM: PORTABLE CHEST 1 VIEW COMPARISON:  04/24/2017 FINDINGS: Moderate right pleural effusion. Small left pleural effusion. Mild bilateral interstitial thickening. No pneumothorax. Stable cardiomediastinal silhouette. Changes from recent CABG. Dual lead cardiac pacemaker. IMPRESSION: Findings concerning for persistent pulmonary edema. Electronically Signed   By: Kathreen Devoid   On: 04/24/2017 12:22    STUDIES:  US Doppler Upper extremities 5/21 >> acute DVT of the left subclavian and axillary veins; possible partial thrombus of left IJ CXR 5/23 > bilateral effusions, moderate effusion on the left, right diaphragm  elevation, stable from 2 days prior TTE 5/23 > LVEF 55-60%, mild LVH, calcified aortic valve w/mild AS (mean gradient 17 mmHg), trace AI, mild TR  CULTURES: UC 5/24 >> e.coli- colonized >sens to rocephin  [facility] 5/24 >>ngtd  Left pleural fluid 5/25 >>  Left pleural fluid AFB >neg smear  ANTIBIOTICS: Rocephin 5/24 for UTI >> 5/25  SIGNIFICANT EVENTS: 5/2 CABG x 2 after NSTEMI 5/9 Discharge to SNF 5/21 Readmitted with L DVT, volume overload/ acute systolic HF 1/32 Developed respiratory distress, tx to SDU 5/24 Hypotension, PCCM consult 5/25 left thoracentesis   LINES/TUBES: PIV Foley   DISCUSSION: 35 yoM admitted with acute left upper arm edema, +DVT, r/o PE,  treated with heparin and bilateral pleural effusions   ASSESSMENT / PLAN:  PULMONARY A: Acute  hypoxic/ hypercapnic respiratory failure  Bilateral Pleural effusions - left moderate Right elevated diaphragm- chronic  Possible Post expansion pulmonary edema s/p thora  +/ - AECOPD  Hx lung CA in 1998 tx with right middle lobe wedge resection and right lower lobectomy, chemo and xrt - Likely multifactorial in the setting of bilateral pulmonary effusions, volume overload/ acute systolic heart failure, +/- COPD (PFTs 03/31/17 show severe obstruction Fev1/FVC 66, Fev1 69), not previously requiring O2.  PE ruled out by CTA.   - s/p Left thoracentesis w/ 1L of serosanguinous fluid 5/25   - Exudative by Lights criteria LDH ratio 0.9 5/26 >improved w/ less dyspnea. CXR w/ increased effusion on left /opacity   P:    Continue Duonebs, Pulmicort, prn albuterol  Follow pleural studies and cytology Wean FiO2 for sats > 92% Pulmonary hygiene  Caution with BB therapy Cont BIPAP At bedtime   Cont solumedrol, wean to prednisone as able.  CARDIOVASCULAR A:  Hypotension DVT of left subclavian and axillary veins Acute on chronic Systolic HF PAF s/p watchmans CAD s/p CABG 5/2 w/ICD Hx HTN, HLD, mild AS - improvement in EF in 5/23 TTE  to 55-60% (no pericardial effusion) - ddx for hypotension d/t BB vs aggressive diuresis, volume responsive to 572ml bolus 5/24 - troponin neg  P:  ICU monitoring Goal MAP > 65 On heparin as below for DVT Cards following, appreciate recs  Continue ASA and Amiodarone  Decrease lasix 40mg  Twice daily  To avoid hypotension and overdiureiss  Consider clear track monitoring to assist with non-invasive HD if worsens Holding scheduled metoprolol and lisinopril for now   RENAL A:   AKI - sCr baseline appears 0.7-0.9 Lactic acidosis -LA cleared  Hypokalemia Hx CKD stage III BPH - lactic cleared 5/25  P:   Trend BMP/ mag/ phos  / urinary output Replace electrolytes as indicated Avoid nephrotoxic agents, ensure adequate renal perfusion Continue proscar  GASTROINTESTINAL A:   No acute issues  P:   Protonix for SUP Diet as tolerated   HEMATOLOGIC A:   LUE DVT Anemia - of chronic disease  - h/h stable  P:  Heparin gtt per pharmacy Trend CBC   INFECTIOUS A:    UTI- E Coli  - normal PCT, afebrile, normal WBC  P:   Cont ceftriaxone   Follow cultures Trend WBC/ fever curve   ENDOCRINE A:   DM type 2 -BS tr up  Hypothyroidism  P:   Add SSI  Continue levothyroxine  NEUROLOGIC A:   No acute issues P:   Ongoing monitoring in ICU  Hold sedating meds   FAMILY  - Updates: Patient and family updated at bedside 5/26  - Inter-disciplinary family meet or Palliative Care meeting due by:  6/1.   Ormand Senn NP-C  [facility]  [phone]   04/25/2017

## 2017-04-26 ENCOUNTER — Inpatient Hospital Stay (HOSPITAL_COMMUNITY): Payer: Medicare Other

## 2017-04-26 DIAGNOSIS — J189 Pneumonia, unspecified organism: Secondary | ICD-10-CM | POA: Diagnosis present

## 2017-04-26 DIAGNOSIS — J9602 Acute respiratory failure with hypercapnia: Secondary | ICD-10-CM

## 2017-04-26 LAB — PHOSPHORUS: Phosphorus: 3.5 mg/dL (ref 2.5–4.6)

## 2017-04-26 LAB — BASIC METABOLIC PANEL
Anion gap: 9 (ref 5–15)
BUN: 35 mg/dL — AB (ref 6–20)
CALCIUM: 9 mg/dL (ref 8.9–10.3)
CO2: 31 mmol/L (ref 22–32)
CREATININE: 1.38 mg/dL — AB (ref 0.61–1.24)
Chloride: 97 mmol/L — ABNORMAL LOW (ref 101–111)
GFR calc Af Amer: 54 mL/min — ABNORMAL LOW (ref >60)
GFR calc non Af Amer: 47 mL/min — ABNORMAL LOW (ref >60)
GLUCOSE: 169 mg/dL — AB (ref 65–99)
Potassium: 3.9 mmol/L (ref 3.5–5.1)
Sodium: 137 mmol/L (ref 135–145)

## 2017-04-26 LAB — CBC
HCT: 28.6 % — ABNORMAL LOW (ref 39.0–52.0)
Hemoglobin: 8.7 g/dL — ABNORMAL LOW (ref 13.0–17.0)
MCH: 27.5 pg (ref 26.0–34.0)
MCHC: 30.4 g/dL (ref 30.0–36.0)
MCV: 90.5 fL (ref 78.0–100.0)
Platelets: 161 10*3/uL (ref 150–400)
RBC: 3.16 MIL/uL — ABNORMAL LOW (ref 4.22–5.81)
RDW: 15.7 % — AB (ref 11.5–15.5)
WBC: 8.2 10*3/uL (ref 4.0–10.5)

## 2017-04-26 LAB — HEPARIN LEVEL (UNFRACTIONATED): HEPARIN UNFRACTIONATED: 0.58 [IU]/mL (ref 0.30–0.70)

## 2017-04-26 LAB — GLUCOSE, CAPILLARY
GLUCOSE-CAPILLARY: 128 mg/dL — AB (ref 65–99)
Glucose-Capillary: 185 mg/dL — ABNORMAL HIGH (ref 65–99)
Glucose-Capillary: 174 mg/dL — ABNORMAL HIGH (ref 65–99)
Glucose-Capillary: 144 mg/dL — ABNORMAL HIGH (ref 65–99)

## 2017-04-26 LAB — MAGNESIUM: Magnesium: 2.2 mg/dL (ref 1.7–2.4)

## 2017-04-26 LAB — TRIGLYCERIDES, BODY FLUIDS: Triglycerides, Fluid: 16 mg/dL

## 2017-04-26 MED ORDER — ONDANSETRON HCL 4 MG/2ML IJ SOLN
4.0000 mg | Freq: Four times a day (QID) | INTRAMUSCULAR | Status: DC | PRN
  Administered 2017-04-27 (×2): 4 mg via INTRAVENOUS
  Filled 2017-04-26 (×2): qty 2

## 2017-04-26 MED ORDER — HYDROMORPHONE HCL 1 MG/ML IJ SOLN
1.0000 mg | Freq: Once | INTRAMUSCULAR | Status: AC
  Administered 2017-04-26: 1 mg via INTRAVENOUS
  Filled 2017-04-26: qty 1

## 2017-04-26 MED ORDER — METHOCARBAMOL 500 MG PO TABS
500.0000 mg | ORAL_TABLET | Freq: Three times a day (TID) | ORAL | Status: DC | PRN
  Administered 2017-04-26 – 2017-04-27 (×2): 500 mg via ORAL
  Filled 2017-04-26 (×2): qty 1

## 2017-04-26 MED ORDER — PREDNISONE 20 MG PO TABS
40.0000 mg | ORAL_TABLET | Freq: Every day | ORAL | Status: DC
  Administered 2017-04-27: 40 mg via ORAL
  Filled 2017-04-26 (×2): qty 2

## 2017-04-26 MED ORDER — KETOROLAC TROMETHAMINE 15 MG/ML IJ SOLN
15.0000 mg | Freq: Once | INTRAMUSCULAR | Status: AC
  Administered 2017-04-26: 15 mg via INTRAVENOUS
  Filled 2017-04-26: qty 1

## 2017-04-26 MED ORDER — ASPIRIN EC 81 MG PO TBEC
81.0000 mg | DELAYED_RELEASE_TABLET | Freq: Every day | ORAL | Status: DC
  Administered 2017-04-27: 81 mg via ORAL
  Filled 2017-04-26: qty 1

## 2017-04-26 MED ORDER — POLYETHYLENE GLYCOL 3350 17 G PO PACK
17.0000 g | PACK | Freq: Every day | ORAL | Status: DC
  Administered 2017-04-26: 17 g via ORAL
  Filled 2017-04-26: qty 1

## 2017-04-26 MED ORDER — PRO-STAT SUGAR FREE PO LIQD
30.0000 mL | Freq: Two times a day (BID) | ORAL | Status: DC
  Administered 2017-04-26 – 2017-04-27 (×4): 30 mL via ORAL
  Filled 2017-04-26 (×4): qty 30

## 2017-04-26 MED ORDER — LEVOFLOXACIN IN D5W 750 MG/150ML IV SOLN
750.0000 mg | INTRAVENOUS | Status: DC
  Administered 2017-04-26: 750 mg via INTRAVENOUS
  Filled 2017-04-26: qty 150

## 2017-04-26 MED ORDER — TRAMADOL HCL 50 MG PO TABS
50.0000 mg | ORAL_TABLET | Freq: Four times a day (QID) | ORAL | Status: DC | PRN
  Administered 2017-04-27: 50 mg via ORAL
  Filled 2017-04-26: qty 1

## 2017-04-26 MED ORDER — GUAIFENESIN ER 600 MG PO TB12
600.0000 mg | ORAL_TABLET | Freq: Two times a day (BID) | ORAL | Status: DC
  Administered 2017-04-26 – 2017-04-27 (×4): 600 mg via ORAL
  Filled 2017-04-26 (×4): qty 1

## 2017-04-26 MED ORDER — DOCUSATE SODIUM 50 MG/5ML PO LIQD
100.0000 mg | Freq: Every day | ORAL | Status: DC
  Administered 2017-04-26: 100 mg via ORAL
  Filled 2017-04-26: qty 10

## 2017-04-26 MED ORDER — AMIODARONE IV BOLUS ONLY 150 MG/100ML
150.0000 mg | Freq: Once | INTRAVENOUS | Status: AC
  Administered 2017-04-26: 150 mg via INTRAVENOUS
  Filled 2017-04-26: qty 100

## 2017-04-26 NOTE — Progress Notes (Signed)
Progress Note  Patient Name: Steven Everett Date of Encounter: 04/26/2017  Primary Cardiologist: Kirk Ruths  Subjective   He feels that his breathing is slightly worse than yesterday. He was able to sleep last night. Had some cough with blood-tinged phlegm production. No chest pain.  Inpatient Medications    Scheduled Meds: . alfuzosin  10 mg Oral Q breakfast  . amiodarone  200 mg Oral Daily  . [START ON 04/27/2017] aspirin EC  81 mg Oral Daily  . chlorhexidine  15 mL Mouth Rinse BID  . docusate  100 mg Oral Daily  . feeding supplement (PRO-STAT SUGAR FREE 64)  30 mL Oral BID  . finasteride  5 mg Oral Daily  . furosemide  40 mg Intravenous Q12H  . guaiFENesin  600 mg Oral BID  . insulin aspart  0-15 Units Subcutaneous TID WC  . ipratropium-albuterol  3 mL Nebulization Q6H  . levothyroxine  112 mcg Oral QAC breakfast  . mouth rinse  15 mL Mouth Rinse q12n4p  . pantoprazole  40 mg Oral Daily  . polyethylene glycol  17 g Oral Daily  . pravastatin  40 mg Oral Daily  . [START ON 04/27/2017] predniSONE  40 mg Oral Q breakfast   Continuous Infusions: . heparin 1,100 Units/hr (04/26/17 1100)  . levofloxacin (LEVAQUIN) IV     PRN Meds: albuterol   Vital Signs    Vitals:   04/26/17 0900 04/26/17 1000 04/26/17 1100 04/26/17 1203  BP:      Pulse: (!) 109 (!) 107 (!) 108   Resp: (!) 25 18 (!) 36   Temp:    97.8 F (36.6 C)  TempSrc:    Oral  SpO2: 100% 100% 99%   Weight:      Height:        Intake/Output Summary (Last 24 hours) at 04/26/17 1209 Last data filed at 04/26/17 1100  Gross per 24 hour  Intake              351 ml  Output             1410 ml  Net            -1059 ml   Filed Weights   04/24/17 0600 04/25/17 0500 04/26/17 0600  Weight: 207 lb 14.3 oz (94.3 kg) 204 lb 5.9 oz (92.7 kg) 206 lb 5.6 oz (93.6 kg)    Telemetry    Atrial fibrillation with rapid suboptimal ventricular rate control. - Personally Reviewed  ECG    No new tracing - Personally  Reviewed  Physical Exam  Some obvious accessory muscle use during breathing GEN: No acute distress. Skin with ecchymoses scattered..   Neck: No JVD. Right IJ line is in place. Cardiac: RRR, no murmurs, rubs, or gallops. Legs and left arm edema. Respiratory: Clear to auscultation bilaterally. GI: Soft, nontender, non-distended  MS: No edema; No deformity. Neuro:  Nonfocal  Psych: Normal affect   Labs    Chemistry Recent Labs Lab 04/21/17 0242  04/24/17 0301 04/25/17 0434 04/26/17 0212  NA 140  < > 140 137 137  K 2.8*  < > 4.5 3.9 3.9  CL 95*  < > 98* 98* 97*  CO2 37*  < > 34* 31 31  GLUCOSE 119*  < > 111* 174* 169*  BUN 18  < > 29* 30* 35*  CREATININE 1.24  < > 1.55* 1.51* 1.38*  CALCIUM 8.6*  < > 8.7* 8.8* 9.0  PROT 5.5*  --   --   --   --  ALBUMIN 2.8*  --   --   --   --   AST 32  --   --   --   --   ALT 40  --   --   --   --   ALKPHOS 83  --   --   --   --   BILITOT 0.8  --   --   --   --   GFRNONAA 53*  < > 41* 42* 47*  GFRAA >60  < > 47* 49* 54*  ANIONGAP 8  < > 8 8 9   < > = values in this interval not displayed.   Hematology  Recent Labs Lab 04/24/17 0301 04/25/17 0434 04/26/17 0212  WBC 7.2 4.2 8.2  RBC 3.24* 3.15* 3.16*  HGB 8.8* 8.6* 8.7*  HCT 30.2* 28.6* 28.6*  MCV 93.2 90.8 90.5  MCH 27.2 27.3 27.5  MCHC 29.1* 30.1 30.4  RDW 16.0* 15.7* 15.7*  PLT 160 138* 161    Cardiac Enzymes  Recent Labs Lab 04/21/2017 1612  TROPONINI 0.03*   No results for input(s): TROPIPOC in the last 168 hours.   BNP  Recent Labs Lab 04/26/2017 1612  BNP 254.8*     DDimer No results for input(s): DDIMER in the last 168 hours.   Radiology    Dg Chest Port 1 View  Result Date: 04/26/2017 CLINICAL DATA:  Pleural effusions. EXAM: PORTABLE CHEST 1 VIEW COMPARISON:  Radiograph of Apr 25, 2017. FINDINGS: Stable cardiomediastinal silhouette. Status post coronary bypass graft. Left-sided pacemaker is unchanged in position. Stable left basilar edema or infiltrate  is noted with associated pleural effusion. Stable moderate right pleural effusion is noted with probable atelectasis or infiltrate. Bony thorax is unremarkable. IMPRESSION: Stable bilateral lung opacities and effusions as described above. Electronically Signed   By: Marijo Conception, M.D.   On: 04/26/2017 07:29   Dg Chest Port 1 View  Result Date: 04/25/2017 CLINICAL DATA:  Acute respiratory failure EXAM: PORTABLE CHEST 1 VIEW COMPARISON:  Apr 24, 2017 FINDINGS: The cardiomediastinal silhouette is stable. An elevated right hemidiaphragm is identified. Right-sided pleural effusion and underlying opacity is stable. Probable layering effusion and underlying opacity in the left base has worsened. No other interval changes. IMPRESSION: 1. Worsening effusion and opacity in the left base. 2. Stable effusion and opacity on the right. Electronically Signed   By: Dorise Bullion III M.D   On: 04/25/2017 07:27    Cardiac Studies   Echocardiogram 04/22/17:  Study Conclusions  - Left ventricle: The cavity size was normal. Wall thickness was   increased in a pattern of mild LVH. Systolic function was normal.   The estimated ejection fraction was in the range of 55% to 60%.   Wall motion was normal; there were no regional wall motion   abnormalities. - Aortic valve: Valve mobility was restricted. There was mild   stenosis. There was trivial regurgitation. - Mitral valve: Calcified annulus. - Pericardium, extracardiac: There was a pleural effusion.  Impressions:  - Normal LV systolic function; mild LVH; calcified aortic valve   with mild AS (mean gradient 17 mmHg) and trace AI; mild TR.  Patient Profile     82 y.o. male with a hx of hypertension, hyperlidemia, GERD/Barrett's esophagus, diet controlled diabetes, hypothyroidism, lung cancer 1998 treated with surgery, chemo and radiation, ICD placement for LV dysfunction 2014, PAF S/P Watchman 01/2016, NSTEMI and subsequent CABG 5/2/2018who is being seen  today for the evaluation of CHF. Underwent high  risk coronary artery bypass grafting 04/01/17 with LIMA to LAD and SVG to RCA. In convalescence developed DVT of the left upper extremity and left subclavian. This was Complicated by acute systolic heart failure. Now with chronic respiratory failure that is multifactorial.  Assessment & Plan    1. Atrial fibrillation with less control of rate compared to yesterday. Heart rates now above 100 bpm. Continue oral amiodarone 2. Acute on chronic diastolic heart failure. There is still evidence of volume overload. Continue diuresis as tolerated by kidney function. Currently receiving furosemide 40 mg twice a day. 3. DVT of subclavian: Continue IV heparin 4. Status post coronary bypass grafting, stable. 5. Hypotension improved. Most recent blood pressure 119/66 mmHg   Currently in atrial fibrillation with relatively rapid ventricular response. On amiodarone. We'll give 1 dose of IV amiodarone today. May eventually need cardioversion after pleural effusions and respiratory status improve.  Signed, Sinclair Grooms, MD  04/26/2017, 12:09 PM

## 2017-04-26 NOTE — Progress Notes (Signed)
ANTICOAGULATION CONSULT NOTE - Follow-up Consult  Pharmacy Consult for heparin Indication: DVT  Allergies  Allergen Reactions  . Atorvastatin Other (See Comments)    joint aches    Patient Measurements: Height: 5\' 10"  (177.8 cm) Weight: 206 lb 5.6 oz (93.6 kg) IBW/kg (Calculated) : 73 Heparin Dosing Weight: 91.6kg  Vital Signs: Temp: 98 F (36.7 C) (05/27 0400) Temp Source: Axillary (05/27 0400) BP: 119/66 (05/27 0600) Pulse Rate: 121 (05/27 0700)  Labs:  Recent Labs  04/24/17 0301 04/25/17 0434 04/25/17 1253 04/25/17 2237 04/26/17 0212  HGB 8.8* 8.6*  --   --  8.7*  HCT 30.2* 28.6*  --   --  28.6*  PLT 160 138*  --   --  161  HEPARINUNFRC 0.68 0.74* 0.79* 0.57 0.58  CREATININE 1.55* 1.51*  --   --  1.38*    Estimated Creatinine Clearance: 49 mL/min (A) (by C-G formula based on SCr of 1.38 mg/dL (H)).   . cefTRIAXone (ROCEPHIN)  IV Stopped (04/25/17 0851)  . heparin 1,100 Units/hr (04/26/17 0226)    Assessment: 49 yof s/p recent CABG presented to the ED with SOB and leg pain. Found to have a DVT. CTA negative for PE. Of note, patient also has AFib w/ watchman device. He was not on anticoagulation prior to admission.   Heparin level is currently at goal. Hemoglobin is low/stable and platelet count within normal limits.   Goal of Therapy:  Heparin level 0.3-0.7 units/ml Monitor platelets by anticoagulation protocol: Yes   Plan:  Continue IV heparin at current rate. Daily heparin level/CBC F/u plans for oral anticoagulation as able.  Uvaldo Rising, BCPS  Clinical Pharmacist Pager [phone]  04/26/2017 7:34 AM

## 2017-04-26 NOTE — Progress Notes (Signed)
Patient is currently on 4LNC with sats of 100%. Patient is resting comfortably and is in no distress. BIPAP is not needed at this time. Will continue to monitor.

## 2017-04-26 NOTE — Progress Notes (Signed)
Pharmacy Antibiotic Note  Steven Everett is a 82 y.o. male admitted on 04/29/2017 with pneumonia.  Pharmacy has been consulted for levaquin dosing.  Was on ceftriaxone for Ecoli UTI.  Est 45 ml/min.  Plan: Levaquin 750 mg IV q 48 hrs. Will watch renal function closely.  Height: 5\' 10"  (177.8 cm) Weight: 206 lb 5.6 oz (93.6 kg) IBW/kg (Calculated) : 73  Temp (24hrs), Avg:97.9 F (36.6 C), Min:97.5 F (36.4 C), Max:98 F (36.7 C)   Recent Labs Lab 04/22/17 0235 04/23/17 0300 04/23/17 1835 04/24/17 0301 04/25/17 0434 04/26/17 0212  WBC 7.1 6.9  --  7.2 4.2 8.2  CREATININE 1.36* 1.38*  --  1.55* 1.51* 1.38*  LATICACIDVEN  --   --  2.9* 1.1  --   --     Estimated Creatinine Clearance: 49 mL/min (A) (by C-G formula based on SCr of 1.38 mg/dL (H)).    Allergies  Allergen Reactions  . Atorvastatin Other (See Comments)    joint aches    Antimicrobials this admission: 5/24 CTX >> 5/27 5/27 LVQ >  Dose adjustments this admission:  Microbiology results: 5/24 BCx x2: ngtd (drawn after CTX dose) 5/24 UCx: > 100,000 colonies Ecoli (S- CTX) 5/25 Pleural Cx fungal:  5/25 Pleural AFB/AFB Cx: 5/25 Pleural Cx: ngtd  Thank you for allowing pharmacy to be a part of this patient's care.  Uvaldo Rising, BCPS  Clinical Pharmacist Pager [phone]  04/26/2017 11:20 AM

## 2017-04-26 NOTE — Progress Notes (Signed)
PULMONARY / CRITICAL CARE MEDICINE   Name: Steven Everett MRN: [id number] DOB: [birth year]    ADMISSION DATE:  04/04/2017 CONSULTATION DATE:  04/23/2017  REFERRING MD:  Dr. Wendee Beavers  CHIEF COMPLAINT:  Hypotension and respiratory distress  HISTORY OF PRESENT ILLNESS:   82 year old male with PMH significant for HTN, HLD, CAD s/p recent CABG s/p ICD, and systolic HF w/LVEF 25-42% (per cath 5/1), mod AS, PAF s/p watchman, cirrhosis, DM type 2, lung ca (1998 tx with right middle lobe wedge resection and right lower lobectomy, chemo and xrt), GERD and hypothyroidism that was admitted on 5/21 with left upper extremity swelling.    He was recently discharge to SNF on 5/9 after recovering from NSTEMI s/p CABG x 2 on 5/2 by Dr. Servando Snare.  During that admission, lovenox was held to pancytopenia.  He additionally developed worsening dyspnea, newly requiring oxygen, bilateral leg swelling, and worsening orthopnea.  He was found to have similar bilateral pleural effusions and probable small apical pneumothorax.  CTA negative for a PE.  He was found to have a DVT of the left upper extremity and left subclavian and axillary veins.  He was placed on heparin drip and diuresed with lasix for acute systolic heart failure.  Since admit, he has been afebrile, with normal WBC.  On 5/23, he developed some shortness of breath and wheezing with improvement on BiPAP, increased dose of lasix and nebs.  He was moved to stepdown for better monitoring. ABG after BiPAP 7.42/60/430 on 1.0 FiO2.  Since, his blood pressure has been liable ranging from SBP 70's to 110.  He has required intermittent BiPAP.  PCCM consulted for ongoing respiratory distress and hypotension.    Of note, no reports of cirrhosis found on review of [place] from 2000 and CT of abd 2009.    SUBJECTIVE:  O2 sats good this am on Dent . BIPAP overnight, tolerated well .  Diuresis with neg 3.8 L I/o bal  Breathing ok but not as good as yesterday .   VITAL SIGNS: BP 119/66    Pulse (!) 121   Temp 97.5 F (36.4 C) (Oral)   Resp (!) 29   Ht 5\' 10"  (1.778 m)   Wt 206 lb 5.6 oz (93.6 kg)   SpO2 100%   BMI 29.61 kg/m   HEMODYNAMICS:   VENTILATOR SETTINGS: FiO2 (%):  [30 %] 30 %  INTAKE / OUTPUT: I/O last 3 completed shifts: In: 617.3 [P.O.:60; I.V.:407.3; Other:100; IV Piggyback:50] Out: 2570 [Urine:2570]  PHYSICAL EXAMINATION: General: chronically ill appearing male in bed .  HEENT: moist mucosa Neuro:a/o x 3 , fc  CV: paced rhythm , + SM  PULM: few rhonchi , no wheezing  HC:WCBJS, obese , soft, nt BS +  Extremities: wm bilaterall , feet wraps, gen edema UE >LE  Skin:  Thin skin , feet wraps. Scattered ecchymosis   LABS:  BMET  Recent Labs Lab 04/24/17 0301 04/25/17 0434 04/26/17 0212  NA 140 137 137  K 4.5 3.9 3.9  CL 98* 98* 97*  CO2 34* 31 31  BUN 29* 30* 35*  CREATININE 1.55* 1.51* 1.38*  GLUCOSE 111* 174* 169*    Electrolytes  Recent Labs Lab 04/24/17 0301 04/25/17 0434 04/26/17 0212  CALCIUM 8.7* 8.8* 9.0  MG 2.0 2.2 2.2  PHOS 3.0 3.4 3.5    CBC  Recent Labs Lab 04/24/17 0301 04/25/17 0434 04/26/17 0212  WBC 7.2 4.2 8.2  HGB 8.8* 8.6* 8.7*  HCT 30.2* 28.6*  28.6*  PLT 160 138* 161    Coag's  Recent Labs Lab 04/07/2017 1612  APTT 33  INR 1.42    Sepsis Markers  Recent Labs Lab 04/23/17 0300 04/23/17 1835 04/24/17 0301 04/25/17 0434  LATICACIDVEN  --  2.9* 1.1  --   PROCALCITON <0.10  --   --  <0.10    ABG  Recent Labs Lab 04/22/17 1221  PHART 7.418  PCO2ART 59.9*  PO2ART 430*    Liver Enzymes  Recent Labs Lab 04/21/17 0242  AST 32  ALT 40  ALKPHOS 83  BILITOT 0.8  ALBUMIN 2.8*    Cardiac Enzymes  Recent Labs Lab 04/11/2017 1612  TROPONINI 0.03*    Glucose  Recent Labs Lab 04/24/17 1957 04/25/17 1159 04/25/17 1624 04/25/17 2116 04/26/17 0810  GLUCAP 200* 178* 205* 138* 185*    Imaging Dg Chest Port 1 View  Result Date: 04/26/2017 CLINICAL DATA:  Pleural  effusions. EXAM: PORTABLE CHEST 1 VIEW COMPARISON:  Radiograph of Apr 25, 2017. FINDINGS: Stable cardiomediastinal silhouette. Status post coronary bypass graft. Left-sided pacemaker is unchanged in position. Stable left basilar edema or infiltrate is noted with associated pleural effusion. Stable moderate right pleural effusion is noted with probable atelectasis or infiltrate. Bony thorax is unremarkable. IMPRESSION: Stable bilateral lung opacities and effusions as described above. Electronically Signed   By: Marijo Conception, M.D.   On: 04/26/2017 07:29    STUDIES:  US Doppler Upper extremities 5/21 >> acute DVT of the left subclavian and axillary veins; possible partial thrombus of left IJ CXR 5/23 > bilateral effusions, moderate effusion on the left, right diaphragm elevation, stable from 2 days prior TTE 5/23 > LVEF 55-60%, mild LVH, calcified aortic valve w/mild AS (mean gradient 17 mmHg), trace AI, mild TR  CULTURES: UC 5/24 >> e.coli- colonized >sens to rocephin  [facility] 5/24 >>ngtd  Left pleural fluid 5/25 >>  Left pleural fluid AFB >neg smear  ANTIBIOTICS: Rocephin 5/24 for UTI >> 5/25  SIGNIFICANT EVENTS: 5/2 CABG x 2 after NSTEMI 5/9 Discharge to SNF 5/21 Readmitted with L DVT, volume overload/ acute systolic HF 7/32 Developed respiratory distress, tx to SDU 5/24 Hypotension, PCCM consult 5/25 left thoracentesis   LINES/TUBES: PIV Foley   DISCUSSION: 48 yoM admitted with acute left upper arm edema, +DVT, r/o PE,  treated with heparin and bilateral pleural effusions   ASSESSMENT / PLAN:  PULMONARY A: Acute hypoxic/ hypercapnic respiratory failure  Bilateral Pleural effusions - left moderate Right elevated diaphragm- chronic  Possible Post expansion pulmonary edema s/p thora -resolved  +/ - AECOPD  Hx lung CA in 1998 tx with right middle lobe wedge resection and right lower lobectomy, chemo and xrt - Likely multifactorial in the setting of bilateral pulmonary effusions,  volume overload/ acute systolic heart failure, +/- COPD (PFTs 03/31/17 show severe obstruction Fev1/FVC 66, Fev1 69), not previously requiring O2.  PE ruled out by CTA.   - s/p Left thoracentesis w/ 1L of serosanguinous fluid 5/25   - Exudative by Lights criteria LDH ratio 0.9 5/27>CXR w/ mod left effusion /opacity , doing well on BIPAP At bedtime  .   P:    Continue Duonebs, Pulmicort, prn albuterol  Follow pleural studies and cytology-pending  Wean FiO2 for sats > 92% Pulmonary hygiene  Caution with BB therapy Cont BIPAP At bedtime   Change solumedrol to prednisone 40mg  in am . -would taper off over few days if able . Avoid sedating meds    FAMILY  -  Updates: Patient and family updated at bedside 5/26  - Inter-disciplinary family meet or Palliative Care meeting due by:  6/1.   Yale Golla NP-C  [facility]  [phone]   04/26/2017

## 2017-04-26 NOTE — Progress Notes (Signed)
Pt has yet to void. Bladder scan shows >337mL. Dr Posey Pronto paged.

## 2017-04-26 NOTE — Progress Notes (Signed)
[facility] Progress Note  Patient: Steven Everett   PCP: Hali Marry, MD DOB: 25-Aug-1935   DOA: 04/26/2017   DOS: 04/26/2017   Date of Service: the patient was seen and examined on 04/26/2017  Subjective: Complains about having increased cough as well as shortness of breath. No chest pain. No fever no chills. He mentions that he noticed some blood streaked sputum yesterday. No diarrhea but actually has constipation. Continues to have swelling of his legs. Also has orthopnea.  Brief hospital course: Pt. with PMH of HTN, HLD, CAD s/p recent CABG s/p ICD, and systolic HF w/LVEF 85-88% (per cath 5/1), mod AS, PAF s/p watchman, cirrhosis, DM type 2, lung ca (1998 tx with right middle lobe wedge resection and right lower lobectomy, chemo and xrt), GERD and hypothyroidism ; admitted on 04/21/2017, presented with complaint of swelling of the left arm, was found to have acute DVT, acute on chronic combined CHF. Currently further plan is continue IV diuresis.  Assessment and Plan: 1. Principal Problem: Acute on chronic combined systolic and diastolic CHF (congestive heart failure) (HCC) Acute hypoxic respiratory failure. Bilateral pleural effusion. Acute healthcare associated pneumonia. Acute exacerbation of COPD. The patient presented with complaints of left wrist swelling but then was found to have acute hypoxia secondary to CHF. Echo shows improved EF of 60%. Patient was started on IV Lasix, cardiology was consulted. Patient was getting increasing doses of Lasix despite which she was not improving and then started developing hypotension. Patient was transferred to step down unit. Underwent thoracentesis. His worsening continues to worsen. Critical care was consulted and patient was transferred to ICU, treated with BiPAP and IV Lasix and IV steroids and DuoNeb. Currently getting better and therefore transferred back to the floor on stepdown. Required BiPAP last night,  currently on IV Lasix 40 mg every 12 hours. Renal function getting better. Oxygenation getting better. Presently short of breath this morning. Continue IV Lasix, recheck x-ray tomorrow, cardiology and CCM continues to follow. Changing IV Solu-Medrol to oral prednisone. Patient's CT scan was showing evidence of pneumonia, given his recent hospitalization and surgery I would treat him with Levaquin. MRSA PCR negative, blood cultures negative therefore no indication for further broadening of antibiotics. Discontinue IV ceftriaxone.  Underwent thoracentesis 04/24/2017. Fluid analysis suggest exudative effusion based on lites criteria. Serosanguineous fluid, cytology currently pending. History of lung cancer in this patient. CT scan earlier did not mention any reoccurrence of cancer, patient will require repeat CAT scan later.  2. A. fib with RVR. ICD implant. Acute upper extremity DVT. Started on heparin on admission, continued on heparin until today. Patient complained about one episode of blood streaked sputum, no active hemoptysis, getting another chest x-ray tomorrow. Hemoglobin remained stable. Continue IV heparin at present. For A. fib patient started on amiodarone, receiving extra dose of amiodarone due to RVR. Appreciate cardiology input. CT PE negative for pulmonary embolism. Transition to by mouth anticoagulation likely tomorrow.  3. Acute kidney injury. Chronic kidney disease stage II. Mild worsening due to diuresis, now getting better and close to baseline. Continue to monitor at present avoid nephrotoxic medication. Pharmacy monitoring for renal dosing of antibiotics.  4. anemia. Hemoglobin 8.7, currently stable. No active bleeding reported other than 1 episode of blood-streaked sputum which I don't think is significant. Continue to monitor in the step down unit.  5. BPH. Patient has indwelling Foley catheter, will continue home regimen and discontinue Foley catheter.  6.  Hypothyroidism. Continue Synthroid. TSH last checked  in February 2018.  7. GERD. Continue PPI at present.  8. Hypokalemia. Currently stable, continue monitoring daily.  Diet: cardiac diet DVT Prophylaxis: on therapeutic anticoagulation.  Advance goals of care discussion: full code  Family Communication: no family was present at bedside, at the time of interview.  Disposition:  Discharge to home.  Consultants: Cardiology, CCM Procedures: Echocardiogram, vascular Doppler lower extremity, BiPAP, thoracentesis  Antibiotics: Anti-infectives    Start     Dose/Rate Route Frequency Ordered Stop   04/26/17 1115  levofloxacin (LEVAQUIN) IVPB 750 mg     750 mg 100 mL/hr over 90 Minutes Intravenous Every 48 hours 04/26/17 1114     04/25/17 1000  cefTRIAXone (ROCEPHIN) 1 g in dextrose 5 % 50 mL IVPB  Status:  Discontinued     1 g 100 mL/hr over 30 Minutes Intravenous Every 24 hours 04/24/17 1502 04/26/17 1047   04/23/17 1030  cefTRIAXone (ROCEPHIN) 1 g in dextrose 5 % 50 mL IVPB  Status:  Discontinued     1 g 100 mL/hr over 30 Minutes Intravenous Every 24 hours 04/23/17 0932 04/24/17 1205       Objective: Physical Exam: Vitals:   04/26/17 0900 04/26/17 1000 04/26/17 1100 04/26/17 1203  BP:      Pulse: (!) 109 (!) 107 (!) 108   Resp: (!) 25 18 (!) 36   Temp:    97.8 F (36.6 C)  TempSrc:    Oral  SpO2: 100% 100% 99%   Weight:      Height:        Intake/Output Summary (Last 24 hours) at 04/26/17 1414 Last data filed at 04/26/17 1100  Gross per 24 hour  Intake              351 ml  Output             1410 ml  Net            -1059 ml   Filed Weights   04/24/17 0600 04/25/17 0500 04/26/17 0600  Weight: 94.3 kg (207 lb 14.3 oz) 92.7 kg (204 lb 5.9 oz) 93.6 kg (206 lb 5.6 oz)   General: Alert, Awake and Oriented to Time, Place and Person. Appear in mild distress, affect appropriate Eyes: PERRL, Conjunctiva normal ENT: Oral Mucosa clear moist. Neck: no JVD, no Abnormal  Mass Or lumps Cardiovascular: S1 and S2 Present, no Murmur, Respiratory: Bilateral Air entry equal and Decreased, no use of accessory muscle, bilateralCrackles, no wheezes Abdomen: Bowel Sound present, Soft and no tenderness Skin: no redness, no Rash, no induration Extremities: bilateral Pedal edema, no calf tenderness Neurologic: Grossly no focal neuro deficit. Bilaterally Equal motor strength  Data Reviewed: CBC:  Recent Labs Lab 04/11/2017 1612 04/22/17 0235 04/23/17 0300 04/24/17 0301 04/25/17 0434 04/26/17 0212  WBC 9.2 7.1 6.9 7.2 4.2 8.2  NEUTROABS 7.8*  --   --   --   --   --   HGB 10.5* 8.8* 9.0* 8.8* 8.6* 8.7*  HCT 34.8* 30.0* 30.9* 30.2* 28.6* 28.6*  MCV 92.6 92.9 93.6 93.2 90.8 90.5  PLT 204 165 151 160 138* 025   Basic Metabolic Panel:  Recent Labs Lab 04/21/17 0242 04/22/17 0235 04/23/17 0300 04/24/17 0301 04/25/17 0434 04/26/17 0212  NA 140 138 139 140 137 137  K 2.8* 3.5 3.8 4.5 3.9 3.9  CL 95* 96* 98* 98* 98* 97*  CO2 37* 36* 31 34* 31 31  GLUCOSE 119* 137* 127* 111* 174* 169*  BUN 18  20 22* 29* 30* 35*  CREATININE 1.24 1.36* 1.38* 1.55* 1.51* 1.38*  CALCIUM 8.6* 8.6* 8.7* 8.7* 8.8* 9.0  MG 2.1  --   --  2.0 2.2 2.2  PHOS  --   --   --  3.0 3.4 3.5    Liver Function Tests:  Recent Labs Lab 04/21/17 0242  AST 32  ALT 40  ALKPHOS 83  BILITOT 0.8  PROT 5.5*  ALBUMIN 2.8*   No results for input(s): LIPASE, AMYLASE in the last 168 hours. No results for input(s): AMMONIA in the last 168 hours. Coagulation Profile:  Recent Labs Lab 04/05/2017 1612  INR 1.42   Cardiac Enzymes:  Recent Labs Lab 04/15/2017 1612  TROPONINI 0.03*   BNP (last 3 results) No results for input(s): PROBNP in the last 8760 hours. CBG:  Recent Labs Lab 04/25/17 1159 04/25/17 1624 04/25/17 2116 04/26/17 0810 04/26/17 1159  GLUCAP 178* 205* 138* 185* 174*   Studies: Dg Chest Port 1 View  Result Date: 04/26/2017 CLINICAL DATA:  Pleural effusions. EXAM:  PORTABLE CHEST 1 VIEW COMPARISON:  Radiograph of Apr 25, 2017. FINDINGS: Stable cardiomediastinal silhouette. Status post coronary bypass graft. Left-sided pacemaker is unchanged in position. Stable left basilar edema or infiltrate is noted with associated pleural effusion. Stable moderate right pleural effusion is noted with probable atelectasis or infiltrate. Bony thorax is unremarkable. IMPRESSION: Stable bilateral lung opacities and effusions as described above. Electronically Signed   By: Marijo Conception, M.D.   On: 04/26/2017 07:29    Scheduled Meds: . alfuzosin  10 mg Oral Q breakfast  . amiodarone  200 mg Oral Daily  . [START ON 04/27/2017] aspirin EC  81 mg Oral Daily  . chlorhexidine  15 mL Mouth Rinse BID  . docusate  100 mg Oral Daily  . feeding supplement (PRO-STAT SUGAR FREE 64)  30 mL Oral BID  . finasteride  5 mg Oral Daily  . furosemide  40 mg Intravenous Q12H  . guaiFENesin  600 mg Oral BID  . insulin aspart  0-15 Units Subcutaneous TID WC  . ipratropium-albuterol  3 mL Nebulization Q6H  . levothyroxine  112 mcg Oral QAC breakfast  . mouth rinse  15 mL Mouth Rinse q12n4p  . pantoprazole  40 mg Oral Daily  . polyethylene glycol  17 g Oral Daily  . pravastatin  40 mg Oral Daily  . [START ON 04/27/2017] predniSONE  40 mg Oral Q breakfast   Continuous Infusions: . heparin 1,100 Units/hr (04/26/17 1100)  . levofloxacin (LEVAQUIN) IV 750 mg (04/26/17 1220)   PRN Meds: albuterol  Time spent: 35 minutes  Author: Berle Mull, MD [facility] Pager: [phone] 04/26/2017 2:14 PM  If 7PM-7AM, please contact night-coverage at [email or web address], password [email or web address]

## 2017-04-27 ENCOUNTER — Inpatient Hospital Stay (HOSPITAL_COMMUNITY): Payer: Medicare Other

## 2017-04-27 DIAGNOSIS — N189 Chronic kidney disease, unspecified: Secondary | ICD-10-CM

## 2017-04-27 DIAGNOSIS — J189 Pneumonia, unspecified organism: Secondary | ICD-10-CM

## 2017-04-27 DIAGNOSIS — I5043 Acute on chronic combined systolic (congestive) and diastolic (congestive) heart failure: Secondary | ICD-10-CM

## 2017-04-27 DIAGNOSIS — E43 Unspecified severe protein-calorie malnutrition: Secondary | ICD-10-CM | POA: Insufficient documentation

## 2017-04-27 DIAGNOSIS — D631 Anemia in chronic kidney disease: Secondary | ICD-10-CM

## 2017-04-27 LAB — CBC
HEMATOCRIT: 26.2 % — AB (ref 39.0–52.0)
HEMOGLOBIN: 7.9 g/dL — AB (ref 13.0–17.0)
MCH: 27.4 pg (ref 26.0–34.0)
MCHC: 30.2 g/dL (ref 30.0–36.0)
MCV: 91 fL (ref 78.0–100.0)
Platelets: 170 10*3/uL (ref 150–400)
RBC: 2.88 MIL/uL — AB (ref 4.22–5.81)
RDW: 16 % — ABNORMAL HIGH (ref 11.5–15.5)
WBC: 12.8 10*3/uL — ABNORMAL HIGH (ref 4.0–10.5)

## 2017-04-27 LAB — RENAL FUNCTION PANEL
Albumin: 2.9 g/dL — ABNORMAL LOW (ref 3.5–5.0)
Anion gap: 10 (ref 5–15)
BUN: 46 mg/dL — AB (ref 6–20)
CHLORIDE: 95 mmol/L — AB (ref 101–111)
CO2: 32 mmol/L (ref 22–32)
Calcium: 9 mg/dL (ref 8.9–10.3)
Creatinine, Ser: 1.59 mg/dL — ABNORMAL HIGH (ref 0.61–1.24)
GFR calc Af Amer: 46 mL/min — ABNORMAL LOW (ref >60)
GFR, EST NON AFRICAN AMERICAN: 39 mL/min — AB (ref >60)
GLUCOSE: 130 mg/dL — AB (ref 65–99)
POTASSIUM: 4 mmol/L (ref 3.5–5.1)
Phosphorus: 4 mg/dL (ref 2.5–4.6)
Sodium: 137 mmol/L (ref 135–145)

## 2017-04-27 LAB — RETICULOCYTES
RBC.: 2.62 MIL/uL — ABNORMAL LOW (ref 4.22–5.81)
RETIC CT PCT: 2.1 % (ref 0.4–3.1)
Retic Count, Absolute: 55 10*3/uL (ref 19.0–186.0)

## 2017-04-27 LAB — GLUCOSE, CAPILLARY
GLUCOSE-CAPILLARY: 146 mg/dL — AB (ref 65–99)
GLUCOSE-CAPILLARY: 142 mg/dL — AB (ref 65–99)
Glucose-Capillary: 125 mg/dL — ABNORMAL HIGH (ref 65–99)

## 2017-04-27 LAB — CBC WITH DIFFERENTIAL/PLATELET
Basophils Absolute: 0 10*3/uL (ref 0.0–0.1)
Basophils Relative: 0 %
EOS ABS: 0 10*3/uL (ref 0.0–0.7)
EOS PCT: 0 %
HCT: 23.8 % — ABNORMAL LOW (ref 39.0–52.0)
Hemoglobin: 7.3 g/dL — ABNORMAL LOW (ref 13.0–17.0)
LYMPHS ABS: 0.7 10*3/uL (ref 0.7–4.0)
Lymphocytes Relative: 6 %
MCH: 27.9 pg (ref 26.0–34.0)
MCHC: 30.7 g/dL (ref 30.0–36.0)
MCV: 90.8 fL (ref 78.0–100.0)
MONO ABS: 0.3 10*3/uL (ref 0.1–1.0)
Monocytes Relative: 3 %
Neutro Abs: 10.5 10*3/uL — ABNORMAL HIGH (ref 1.7–7.7)
Neutrophils Relative %: 91 %
PLATELETS: 146 10*3/uL — AB (ref 150–400)
RBC: 2.62 MIL/uL — AB (ref 4.22–5.81)
RDW: 16 % — AB (ref 11.5–15.5)
WBC: 11.6 10*3/uL — AB (ref 4.0–10.5)

## 2017-04-27 LAB — MAGNESIUM: MAGNESIUM: 2 mg/dL (ref 1.7–2.4)

## 2017-04-27 LAB — PREPARE RBC (CROSSMATCH)

## 2017-04-27 LAB — PROCALCITONIN: Procalcitonin: <0.1 ng/mL

## 2017-04-27 LAB — HEPARIN LEVEL (UNFRACTIONATED): HEPARIN UNFRACTIONATED: 0.45 [IU]/mL (ref 0.30–0.70)

## 2017-04-27 MED ORDER — LEVOFLOXACIN 750 MG PO TABS
750.0000 mg | ORAL_TABLET | ORAL | Status: DC
  Filled 2017-04-27: qty 1

## 2017-04-27 MED ORDER — BOOST PLUS PO LIQD
237.0000 mL | Freq: Two times a day (BID) | ORAL | Status: DC
  Administered 2017-04-27: 237 mL via ORAL
  Filled 2017-04-27: qty 237

## 2017-04-27 MED ORDER — SODIUM CHLORIDE 0.9 % IV BOLUS (SEPSIS)
250.0000 mL | Freq: Once | INTRAVENOUS | Status: AC
  Administered 2017-04-27: 250 mL via INTRAVENOUS

## 2017-04-27 MED ORDER — ALBUMIN HUMAN 25 % IV SOLN
12.5000 g | Freq: Once | INTRAVENOUS | Status: DC
  Filled 2017-04-27: qty 50

## 2017-04-27 MED ORDER — SODIUM CHLORIDE 0.9 % IV SOLN
Freq: Once | INTRAVENOUS | Status: AC
  Administered 2017-04-27: 18:00:00 via INTRAVENOUS

## 2017-04-27 MED ORDER — OXYCODONE-ACETAMINOPHEN 5-325 MG PO TABS
1.0000 | ORAL_TABLET | ORAL | Status: DC | PRN
  Administered 2017-04-27 – 2017-04-28 (×4): 1 via ORAL
  Filled 2017-04-27 (×4): qty 1

## 2017-04-27 MED ORDER — HYDROMORPHONE HCL 1 MG/ML IJ SOLN
1.0000 mg | Freq: Once | INTRAMUSCULAR | Status: AC
  Administered 2017-04-27: 1 mg via INTRAVENOUS
  Filled 2017-04-27: qty 1

## 2017-04-27 MED ORDER — HYDROMORPHONE HCL 1 MG/ML IJ SOLN
0.5000 mg | INTRAMUSCULAR | Status: DC | PRN
  Administered 2017-04-27 (×2): 0.5 mg via INTRAVENOUS
  Filled 2017-04-27: qty 1
  Filled 2017-04-27: qty 0.5

## 2017-04-27 MED ORDER — CYCLOBENZAPRINE HCL 10 MG PO TABS
5.0000 mg | ORAL_TABLET | Freq: Three times a day (TID) | ORAL | Status: DC
  Administered 2017-04-27 (×3): 5 mg via ORAL
  Filled 2017-04-27 (×3): qty 1

## 2017-04-27 MED ORDER — ALBUMIN HUMAN 25 % IV SOLN: 25.0000 g | Freq: Once | INTRAVENOUS | Status: DC

## 2017-04-27 MED ORDER — SODIUM CHLORIDE 0.9 % IV BOLUS (SEPSIS)
500.0000 mL | Freq: Once | INTRAVENOUS | Status: AC
  Administered 2017-04-27: 500 mL via INTRAVENOUS

## 2017-04-27 MED ORDER — ALBUMIN HUMAN 5 % IV SOLN
12.5000 g | Freq: Once | INTRAVENOUS | Status: AC
  Administered 2017-04-27: 12.5 g via INTRAVENOUS

## 2017-04-27 NOTE — Progress Notes (Addendum)
PULMONARY / CRITICAL CARE MEDICINE   Name: Steven Everett MRN: [id number] DOB: June 26, 1935    ADMISSION DATE:  04/13/2017 CONSULTATION DATE:  04/23/2017  REFERRING MD:  Dr. Wendee Beavers  CHIEF COMPLAINT:  Hypotension and respiratory distress  HISTORY OF PRESENT ILLNESS:   82 year old male with PMH significant for HTN, HLD, CAD s/p recent CABG s/p ICD, and systolic HF w/LVEF 27-25% (per cath 5/1), mod AS, PAF s/p watchman, cirrhosis, DM type 2, lung ca (1998 tx with right middle lobe wedge resection and right lower lobectomy, chemo and xrt), GERD and hypothyroidism that was admitted on 5/21 with left upper extremity swelling.    He was recently discharge to SNF on 5/9 after recovering from NSTEMI s/p CABG x 2 on 5/2 by Dr. Servando Snare.  During that admission, lovenox was held to pancytopenia.  He additionally developed worsening dyspnea, newly requiring oxygen, bilateral leg swelling, and worsening orthopnea.  He was found to have similar bilateral pleural effusions and probable small apical pneumothorax.  CTA negative for a PE.  He was found to have a DVT of the left upper extremity and left subclavian and axillary veins.  He was placed on heparin drip and diuresed with lasix for acute systolic heart failure.  Since admit, he has been afebrile, with normal WBC.  On 5/23, he developed some shortness of breath and wheezing with improvement on BiPAP, increased dose of lasix and nebs.  He was moved to stepdown for better monitoring. ABG after BiPAP 7.42/60/430 on 1.0 FiO2.  Since, his blood pressure has been liable ranging from SBP 70's to 110.  He has required intermittent BiPAP.  PCCM consulted for ongoing respiratory distress and hypotension.    Of note, no reports of cirrhosis found on review of [place] from 2000 and CT of abd 2009.    SUBJECTIVE:  Mostly complaining of pain/ some congested coughing/ not using flutter appropriately   VITAL SIGNS: BP (!) 69/43 (BP Location: Right Arm)   Pulse 93   Temp 98 F  (36.7 C) (Oral)   Resp (!) 21   Ht 5\' 10"  (1.778 m)   Wt 206 lb (93.4 kg)   SpO2 100%   BMI 29.56 kg/m  FIO2:  4lpm    HEMODYNAMICS:   VENTILATOR SETTINGS:    INTAKE / OUTPUT: I/O last 3 completed shifts: In: 52 [P.O.:60; I.V.:417; IV Piggyback:200] Out: 1480 [Urine:1480]  PHYSICAL EXAMINATION: General: chronically ill appearing male unhappy with progress mostly c/o pain in back .  HEENT: moist mucosa Neuro:a/o x 3 , fc  CV: paced rhythm , + SM  PULM: bilateral insp and exp rhonchi with decreased bs / dullness R base  DG:UYQIH, obese , soft, nt BS +  Extremities: wm bilaterall , feet wraps, gen edema UE >LE  Skin:  Thin skin , feet wraps. Scattered ecchymosis/ sacral decub dressing applied    LABS:  BMET  Recent Labs Lab 04/25/17 0434 04/26/17 0212 04/27/17 0824  NA 137 137 137  K 3.9 3.9 4.0  CL 98* 97* 95*  CO2 31 31 32  BUN 30* 35* 46*  CREATININE 1.51* 1.38* 1.59*  GLUCOSE 174* 169* 130*    Electrolytes  Recent Labs Lab 04/25/17 0434 04/26/17 0212 04/27/17 0824  CALCIUM 8.8* 9.0 9.0  MG 2.2 2.2 2.0  PHOS 3.4 3.5 4.0    CBC  Recent Labs Lab 04/26/17 0212 04/27/17 0824 04/27/17 1419  WBC 8.2 12.8* 11.6*  HGB 8.7* 7.9* 7.3*  HCT 28.6* 26.2* 23.8*  PLT 161 170 146*    Coag's No results for input(s): APTT, INR in the last 168 hours.  Sepsis Markers  Recent Labs Lab 04/23/17 0300 04/23/17 1835 04/24/17 0301 04/25/17 0434 04/27/17 0439  LATICACIDVEN  --  2.9* 1.1  --   --   PROCALCITON <0.10  --   --  <0.10 <0.10    ABG  Recent Labs Lab 04/22/17 1221  PHART 7.418  PCO2ART 59.9*  PO2ART 430*    Liver Enzymes  Recent Labs Lab 04/21/17 0242 04/27/17 0824  AST 32  --   ALT 40  --   ALKPHOS 83  --   BILITOT 0.8  --   ALBUMIN 2.8* 2.9*    Cardiac Enzymes No results for input(s): TROPONINI, PROBNP in the last 168 hours.  Glucose  Recent Labs Lab 04/26/17 1159 04/26/17 1659 04/26/17 2124 04/27/17 0836  04/27/17 1316 04/27/17 1622  GLUCAP 174* 128* 144* 125* 146* 142*    Imaging Ct Hip Right Wo Contrast  Result Date: 04/27/2017 CLINICAL DATA:  Twisting injury in wheelchair. Sudden onset of anterior right groin pain. History of right hip arthroplasty. EXAM: CT OF THE RIGHT HIP WITHOUT CONTRAST TECHNIQUE: Multidetector CT imaging of the right hip was performed according to the standard protocol. Multiplanar CT image reconstructions were also generated. COMPARISON:  Radiograph same date.  Pelvic CT 07/31/2008. FINDINGS: Bones/Joint/Cartilage Right total hip arthroplasty creates beam hardening artifact, mildly limiting evaluation of the pubic rami. There is no evidence of hardware loosening, acute fracture or dislocation. No large hip joint effusion identified. Ligaments Not relevant for exam/indication. Muscles and Tendons Unremarkable. Soft tissues Generalized subcutaneous edema throughout the visualized pelvis and proximal right thigh. No focal fluid collections are identified. Edema tracks into the pelvis along the anterior aspect of the iliac vessels and right psoas muscle. There is iliofemoral atherosclerosis. The bladder is distended. Lower lumbar spondylosis noted. IMPRESSION: 1. No acute osseous findings status post right total hip arthroplasty. 2. Nonspecific soft tissue edema throughout the right pelvis and proximal thigh. No focal fluid collections identified. Electronically Signed   By: Richardean Sale M.D.   On: 04/27/2017 12:37   Dg Chest Port 1 View  Result Date: 04/27/2017 CLINICAL DATA:  Follow-up effusion EXAM: PORTABLE CHEST 1 VIEW COMPARISON:  04/26/2017 FINDINGS: Cardiac shadow is stable. Postsurgical changes are again seen. Pacing device is again noted. Right-sided pleural effusion is again noted. Persistent left basilar infiltrate is seen. No new focal abnormality is noted. IMPRESSION: No significant interval changes noted. Electronically Signed   By: Inez Catalina M.D.   On:  04/27/2017 08:16   Dg Hip Port Unilat With Pelvis 1v Right  Result Date: 04/27/2017 CLINICAL DATA:  Right groin pain EXAM: DG HIP (WITH OR WITHOUT PELVIS) 1V PORT RIGHT COMPARISON:  None. FINDINGS: Right total hip arthroplasty. No fracture or dislocation. No hardware failure or complication. IMPRESSION: Right total hip arthroplasty without hardware failure or complication. Electronically Signed   By: Kathreen Devoid   On: 04/27/2017 08:31    STUDIES:  US Doppler Upper extremities 5/21 >> acute DVT of the left subclavian and axillary veins; possible partial thrombus of left IJ CXR 5/23 > bilateral effusions, moderate effusion on the left, right diaphragm elevation, stable from 2 days prior TTE 5/23 > LVEF 55-60%, mild LVH, calcified aortic valve w/mild AS (mean gradient 17 mmHg), trace AI, mild TR  CULTURES: UC 5/24 >> e.coli- colonized >sens to rocephin  [facility] 5/24 >>ngtd  Left pleural fluid 5/25  1.5 liters  LDH 0.9 ratio Left pleural fluid AFB >neg smear  ANTIBIOTICS: Rocephin 5/24 for UTI >> 5/25 Levaquin 5/27 >>   SIGNIFICANT EVENTS: 5/2 CABG x 2 after NSTEMI 5/9 Discharge to SNF 5/21 Readmitted with L DVT, volume overload/ acute systolic HF 6/38 Developed respiratory distress, tx to SDU 5/24 Hypotension, PCCM consult 5/25 left thoracentesis x 1.5 liters  LINES/TUBES: PIV Foley   DISCUSSION: 77 yoM admitted with acute left upper arm edema, +DVT, r/o PE,  treated with heparin and bilateral pleural effusions   ASSESSMENT / PLAN:  PULMONARY A: Acute hypoxic/ hypercapnic respiratory failure  Bilateral Pleural effusions - left moderate Right elevated diaphragm- chronic  Possible Post expansion pulmonary edema s/p thora -resolved  +/ - AECOPD  Hx lung CA in 1998 tx with right middle lobe wedge resection and right lower lobectomy, chemo and xrt - Likely multifactorial in the setting of bilateral pulmonary effusions, volume overload/ acute systolic heart failure, +/- COPD (PFTs  03/31/17 show severe obstruction Fev1/FVC 66, Fev1 69), not previously requiring O2.  PE ruled out by CTA.   - s/p Left thoracentesis w/ 1.5L of serosanguinous fluid 5/25   - Exudative by Lights criteria LDH ratio 0.9   5/27>CXR w/ mod left effusion /opacity , doing well on BIPAP At bedtime  .   P:    Continue Duonebs, Pulmicort, prn albuterol  Follow pleural   cytology-pending  Wean FiO2 for sats > 92% Pulmonary hygiene  Caution with BB therapy Cont BIPAP At bedtime   Change solumedrol to prednisone 40mg  in am . -would taper off over few days if able . Avoid sedating meds   Extended bedside discussion with pt / son - very discouraged with progress and pain control (back pain) but really needs to be better mobilized/ nothing else to offer for now   Christinia Gully, MD Pulmonary and [place] [phone] After 5:30 PM or weekends, use [facility] [phone]

## 2017-04-27 NOTE — Progress Notes (Signed)
Progress Note  Patient Name: Steven Everett Date of Encounter: 04/27/2017  Primary Cardiologist: Kirk Ruths  Subjective   He feels that his breathing is slightly worse than yesterday. He is complaining of severe hip pain. Just came back from hip CT.  Inpatient Medications    Scheduled Meds: . alfuzosin  10 mg Oral Q breakfast  . amiodarone  200 mg Oral Daily  . aspirin EC  81 mg Oral Daily  . chlorhexidine  15 mL Mouth Rinse BID  . cyclobenzaprine  5 mg Oral TID  . docusate  100 mg Oral Daily  . feeding supplement (PRO-STAT SUGAR FREE 64)  30 mL Oral BID  . finasteride  5 mg Oral Daily  . furosemide  40 mg Intravenous Q12H  . guaiFENesin  600 mg Oral BID  . insulin aspart  0-15 Units Subcutaneous TID WC  . ipratropium-albuterol  3 mL Nebulization Q6H  . lactose free nutrition  237 mL Oral BID BM  . [START ON 05-28-17] levofloxacin  750 mg Oral Q48H  . levothyroxine  112 mcg Oral QAC breakfast  . mouth rinse  15 mL Mouth Rinse q12n4p  . pantoprazole  40 mg Oral Daily  . polyethylene glycol  17 g Oral Daily  . pravastatin  40 mg Oral Daily  . predniSONE  40 mg Oral Q breakfast   Continuous Infusions: . heparin 1,100 Units/hr (04/27/17 0900)   PRN Meds: albuterol, HYDROmorphone (DILAUDID) injection, ondansetron (ZOFRAN) IV, oxyCODONE-acetaminophen   Vital Signs    Vitals:   04/27/17 0839 04/27/17 0900 04/27/17 1000 04/27/17 1100  BP: (!) 116/45 123/79 117/65   Pulse: 80 84 87 63  Resp: 15 (!) 24 (!) 21 18  Temp: 97.7 F (36.5 C)     TempSrc: Oral     SpO2: 100% 100% 100% 100%  Weight:      Height:        Intake/Output Summary (Last 24 hours) at 04/27/17 1209 Last data filed at 04/27/17 1000  Gross per 24 hour  Intake              632 ml  Output              520 ml  Net              112 ml   Filed Weights   04/25/17 0500 04/26/17 0600 04/27/17 0359  Weight: 204 lb 5.9 oz (92.7 kg) 206 lb 5.6 oz (93.6 kg) 206 lb (93.4 kg)    Telemetry    Atrial  fibrillation with rapid suboptimal ventricular rate control. - Personally Reviewed  ECG    No new tracing - Personally Reviewed  Physical Exam  GEN: No acute distress. Skin with ecchymoses scattered. Swelling in the upper extremities.   Neck: + 6 cm B/L JVD. Right IJ line is in place. Cardiac: RRR, no murmurs, rubs, or gallops. Legs and left arm edema. Respiratory: Clear to auscultation bilaterally. GI: Soft, nontender, non-distended  MS: No edema; No deformity. Neuro:  Nonfocal  Psych: Normal affect   Labs    Chemistry Recent Labs Lab 04/21/17 0242  04/25/17 0434 04/26/17 0212 04/27/17 0824  NA 140  < > 137 137 137  K 2.8*  < > 3.9 3.9 4.0  CL 95*  < > 98* 97* 95*  CO2 37*  < > 31 31 32  GLUCOSE 119*  < > 174* 169* 130*  BUN 18  < > 30* 35* 46*  CREATININE  1.24  < > 1.51* 1.38* 1.59*  CALCIUM 8.6*  < > 8.8* 9.0 9.0  PROT 5.5*  --   --   --   --   ALBUMIN 2.8*  --   --   --  2.9*  AST 32  --   --   --   --   ALT 40  --   --   --   --   ALKPHOS 83  --   --   --   --   BILITOT 0.8  --   --   --   --   GFRNONAA 53*  < > 42* 47* 39*  GFRAA >60  < > 49* 54* 46*  ANIONGAP 8  < > 8 9 10   < > = values in this interval not displayed.   Hematology  Recent Labs Lab 04/25/17 0434 04/26/17 0212 04/27/17 0824  WBC 4.2 8.2 12.8*  RBC 3.15* 3.16* 2.88*  HGB 8.6* 8.7* 7.9*  HCT 28.6* 28.6* 26.2*  MCV 90.8 90.5 91.0  MCH 27.3 27.5 27.4  MCHC 30.1 30.4 30.2  RDW 15.7* 15.7* 16.0*  PLT 138* 161 170    Cardiac Enzymes  Recent Labs Lab 04/03/2017 1612  TROPONINI 0.03*   No results for input(s): TROPIPOC in the last 168 hours.   BNP  Recent Labs Lab 04/16/2017 1612  BNP 254.8*     DDimer No results for input(s): DDIMER in the last 168 hours.   Radiology    Dg Chest Port 1 View  Result Date: 04/27/2017 CLINICAL DATA:  Follow-up effusion EXAM: PORTABLE CHEST 1 VIEW COMPARISON:  04/26/2017 FINDINGS: Cardiac shadow is stable. Postsurgical changes are again seen.  Pacing device is again noted. Right-sided pleural effusion is again noted. Persistent left basilar infiltrate is seen. No new focal abnormality is noted. IMPRESSION: No significant interval changes noted. Electronically Signed   By: Inez Catalina M.D.   On: 04/27/2017 08:16   Dg Chest Port 1 View  Result Date: 04/26/2017 CLINICAL DATA:  Pleural effusions. EXAM: PORTABLE CHEST 1 VIEW COMPARISON:  Radiograph of Apr 25, 2017. FINDINGS: Stable cardiomediastinal silhouette. Status post coronary bypass graft. Left-sided pacemaker is unchanged in position. Stable left basilar edema or infiltrate is noted with associated pleural effusion. Stable moderate right pleural effusion is noted with probable atelectasis or infiltrate. Bony thorax is unremarkable. IMPRESSION: Stable bilateral lung opacities and effusions as described above. Electronically Signed   By: Marijo Conception, M.D.   On: 04/26/2017 07:29   Dg Hip Port Unilat With Pelvis 1v Right  Result Date: 04/27/2017 CLINICAL DATA:  Right groin pain EXAM: DG HIP (WITH OR WITHOUT PELVIS) 1V PORT RIGHT COMPARISON:  None. FINDINGS: Right total hip arthroplasty. No fracture or dislocation. No hardware failure or complication. IMPRESSION: Right total hip arthroplasty without hardware failure or complication. Electronically Signed   By: Kathreen Devoid   On: 04/27/2017 08:31    Cardiac Studies   Echocardiogram 04/22/17:  Study Conclusions  - Left ventricle: The cavity size was normal. Wall thickness was   increased in a pattern of mild LVH. Systolic function was normal.   The estimated ejection fraction was in the range of 55% to 60%.   Wall motion was normal; there were no regional wall motion   abnormalities. - Aortic valve: Valve mobility was restricted. There was mild   stenosis. There was trivial regurgitation. - Mitral valve: Calcified annulus. - Pericardium, extracardiac: There was a pleural effusion.  Impressions:  -  Normal LV systolic  function; mild LVH; calcified aortic valve   with mild AS (mean gradient 17 mmHg) and trace AI; mild TR.  Patient Profile     82 y.o. male with a hx of hypertension, hyperlidemia, GERD/Barrett's esophagus, diet controlled diabetes, hypothyroidism, lung cancer 1998 treated with surgery, chemo and radiation, ICD placement for LV dysfunction 2014, PAF S/P Watchman 01/2016, NSTEMI and subsequent CABG 5/2/2018who is being seen today for the evaluation of CHF. Underwent high risk coronary artery bypass grafting 04/01/17 with LIMA to LAD and SVG to RCA. In convalescence developed DVT of the left upper extremity and left subclavian. This was Complicated by acute systolic heart failure. Now with chronic respiratory failure that is multifactorial.  Assessment & Plan    1. Atrial fibrillation with controlled ventr rates now in 80'. Continue oral amiodarone. 2. Acute on chronic diastolic heart failure. There is still evidence of volume overload in his arms, however lungs are CTA and crea is worsening, I will hold lasix. 3. DVT of subclavian: Continue IV heparin 4. Status post coronary bypass grafting, stable. 5. Hypotension improved. Most recent blood pressure 119/66 mmHg 6. Severe anemia - now worsening with SOB at rest, I will give 1 unit of PRBC, reassess volume statis in the morning. 7. R hip pain - X ray and CT negative  Currently in atrial fibrillation with relatively rapid ventricular response. On amiodarone. We'll give 1 dose of IV amiodarone today. May eventually need cardioversion after pleural effusions and respiratory status improve.  Signed, Ena Dawley, MD  04/27/2017, 12:09 PM

## 2017-04-27 NOTE — Progress Notes (Signed)
Dr. Olevia Bowens notified of patients complaints of right groin pain, rated 10/10 on pain scale. New orders received for Dilaudid 1mg  stat and Toradol 15mg  one hour later. Tramadol was patients home med used for moderate pain and new order placed for 50mg  Q6 hrs PRN. Doppler U/S ordered for AM. No redness or swelling noted, but area tender to touch. Heat packs applied for comfort. Will continue to monitor closely.

## 2017-04-27 NOTE — Significant Event (Signed)
Patient's BP dropped to 70s after receiving PRN dilaudid and scheduled flexeril. Patient only complaint is continued pain on the right groin/hip. Dr. Posey Pronto made aware. Patient currently getting a unit of blood. New orders to give 250cc NS if BP does not improve with blood infusion. Patient received the 250cc NS bolus.

## 2017-04-27 NOTE — Progress Notes (Signed)
ANTICOAGULATION CONSULT NOTE - Follow-up Consult  Pharmacy Consult for heparin Indication: DVT  Allergies  Allergen Reactions  . Atorvastatin Other (See Comments)    joint aches    Patient Measurements: Height: 5\' 10"  (177.8 cm) Weight: 206 lb (93.4 kg) IBW/kg (Calculated) : 73 Heparin Dosing Weight: 91.6kg  Vital Signs: Temp: 97.7 F (36.5 C) (05/28 0839) Temp Source: Oral (05/28 0839) BP: 116/45 (05/28 0839) Pulse Rate: 80 (05/28 0839)  Labs:  Recent Labs  04/25/17 0434  04/25/17 2237 04/26/17 0212 04/27/17 0439  HGB 8.6*  --   --  8.7*  --   HCT 28.6*  --   --  28.6*  --   PLT 138*  --   --  161  --   HEPARINUNFRC 0.74*  < > 0.57 0.58 0.45  CREATININE 1.51*  --   --  1.38*  --   < > = values in this interval not displayed.  Estimated Creatinine Clearance: 49 mL/min (A) (by C-G formula based on SCr of 1.38 mg/dL (H)).   . heparin 1,100 Units/hr (04/27/17 0400)    Assessment: 45 yof s/p recent CABG presented to the ED with SOB and leg pain. Found to have a DVT. CTA negative for PE. Of note, patient also has AFib w/ watchman device. He was not on anticoagulation prior to admission.   Heparin level remains at goal on 1100 units/hr. Hemoglobin is low/stable and platelet count within normal limits.   Goal of Therapy:  Heparin level 0.3-0.7 units/ml Monitor platelets by anticoagulation protocol: Yes   Plan:  Continue IV heparin at current rate. Daily heparin level/CBC F/u plans for oral anticoagulation as able.  Maryanna Shape, PharmD, BCPS  Clinical Pharmacist  Pager: [phone]    04/27/2017 8:50 AM

## 2017-04-27 NOTE — Progress Notes (Signed)
Initial Nutrition Assessment  DOCUMENTATION CODES:   Severe malnutrition in context of acute illness/injury (likely chronic in nature as well)  INTERVENTION:   -Continue Pro-State BID -Add BoostPlus BID -Liberalize diet to 2g sodium restriction only at present to allow pt more options to optimize po intake   NUTRITION DIAGNOSIS:   Malnutrition (Severe) related to acute illness (likely chronic in nature as well; recent CABG, acute CHF, HCAP, cirrhosis) as evidenced by energy intake < or equal to 50% for > or equal to 5 days, severe depletion of body fat, moderate to severe fluid accumulation, severe depletion of muscle mass.  GOAL:   Patient will meet greater than or equal to 90% of their needs   MONITOR:   PO intake, Supplement acceptance, Labs, Weight trends, I & O's  REASON FOR ASSESSMENT:   Consult Assessment of nutrition requirement/status  ASSESSMENT:   82 yo male admitted with DVT, systolic CHF exacerbation with large left sided pleural effusion with acute respiratory failure, HCAP, acute COPD exacerbation. Pt with hx of HTN,  HLD, CAD s/p CABG, CHF LVEF 35-40%, cirrhosis, DM, lung ca, CKD  5/25 Thoracentesis with 1 L fluid removed  Pt reports poor appetite since being admitted to hospital. Pt did not eat any breakfast this AM. Recorded po intake <50% of meals on average since admission. (41% with recent intake 0-25% only) Noted MD ordered Pro-Stat supplement yesterday for pt, pt as received and tolerated, willing to continue to take. Pt also reports he likes Boost.   Pt reports PTA very good appetite with no wt loss.   Nutrition-Focused physical exam completed. Findings are severe fat depletion in orbital region only , mild to severe muscle depletion, and moderate edema.   Weight trend is up but pt with significant edema in all extremeties. Pt reports he has been experiencing fluid gains since having heart surgery earlier this month.  Labs: reviewed Meds: lasix,  prednisone  Diet Order:  Diet 2 gram sodium Room service appropriate? Yes; Fluid consistency: Thin  Skin:  Reviewed, no issues (surgical incisions, blisters but no pressure ulcers)  Last BM:  04/26/17  Height:   Ht Readings from Last 1 Encounters:  04/22/17 5\' 10"  (1.778 m)    Weight:   Wt Readings from Last 1 Encounters:  04/27/17 206 lb (93.4 kg)    Ideal Body Weight:     BMI:  Body mass index is 29.56 kg/m.  Estimated Nutritional Needs:   Kcal:  1900-2100 kcals  Protein:  90-105 g  Fluid:  >/= 1.8 L  EDUCATION NEEDS:   No education needs identified at this time  [place], [place], LDN [phone] Pager  [phone] Weekend/On-Call Pager

## 2017-04-27 NOTE — Clinical Social Work Note (Signed)
Pt from [facility]. CSW will discharge back to SNF when medically stable. CSW continues to follow for discharge needs.   Steven Everett, [place], [place] work [phone]

## 2017-04-27 NOTE — Progress Notes (Signed)
PT Cancellation Note  Patient Details Name: Steven Everett MRN: [id number] DOB: 1935-03-18   Cancelled Treatment:    Reason Eval/Treat Not Completed: Patient at procedure or test/unavailable.   Duncan Dull 04/27/2017, 11:55 AM

## 2017-04-27 NOTE — Significant Event (Signed)
Patient with persistent hypotension in the SBP 60-70s and had vomitted X 2 this evening (total 3 times today). RN notified triad MD. New orders received for albumin and NS boluses. Patient continues to complain of right hip pain and requesting for pain medication.

## 2017-04-27 NOTE — Progress Notes (Addendum)
[facility] Progress Note  Patient: Steven Everett VVO:[id number]   PCP: Hali Marry, MD DOB: 1935-08-26   DOA: 04/06/2017   DOS: 04/27/2017   Date of Service: the patient was seen and examined on 04/27/2017  Subjective: Patient complains about pain in his right groin. His breathing is about the same. Cough is about the same. No chest pain. No nausea no vomiting. He told me that he feels that he is not getting better.  Brief hospital course: Pt. with PMH of HTN, HLD, CAD s/p recent CABG s/p ICD, and systolic HF w/LVEF 26-94% (per cath 5/1), mod AS, PAF s/p watchman, cirrhosis, DM type 2, lung ca (1998 tx with right middle lobe wedge resection and right lower lobectomy, chemo and xrt), GERD and hypothyroidism ; admitted on 04/02/2017, presented with complaint of swelling of the left arm, was found to have acute DVT, acute on chronic combined CHF. Currently further plan is continue IV diuresis.  Assessment and Plan: 1. Acute on chronic combined systolic and diastolic CHF (congestive heart failure) (HCC) Acute hypoxic respiratory failure. Bilateral pleural effusion. Acute healthcare associated pneumonia. Acute exacerbation of COPD. The patient presented with complaints of left wrist swelling but then was found to have acute hypoxia secondary to CHF. Echo shows improved EF of 60%. improved over prior. Patient was started on IV Lasix, cardiology was consulted. Patient was getting increasing doses of Lasix despite which pt was not improving and then started developing hypotension.  Patient was transferred to step down unit. Underwent thoracentesis.  Critical care was consulted and patient was transferred to ICU, treated with BiPAP and IV Lasix and IV steroids and DuoNeb. Currently getting better and therefore transferred back to the floor on stepdown. Did not Required BiPAP last night, currently on IV Lasix 40 mg every 12 hours. Renal function getting better. Oxygenation about the same.     Continue IV Lasix, recheck x-ray shows mild worsening of pleural effusion although the report mentions no interval change. cardiology and CCM continues to follow.  Changing IV Solu-Medrol to oral prednisone. Patient's CT scan was showing evidence of pneumonia, given his recent hospitalization and surgery I would treat him with Levaquin.  MRSA PCR negative, blood cultures negative therefore no indication for further broadening of antibiotics. Discontinue IV ceftriaxone.  Underwent thoracentesis 04/24/2017. Fluid analysis suggest exudative effusion based on lites criteria. Serosanguineous fluid, cytology currently pending. History of lung cancer in this patient. CT scan earlier did not mention any reoccurrence of cancer, patient will require repeat CAT scan later.  2. A. fib with RVR. ICD implant. Acute upper extremity DVT. Started on heparin on admission, continued on heparin until today. Patient complained about one episode of blood streaked sputum, no active hemoptysis, getting another chest x-ray tomorrow. Hemoglobin remained stable. Continue IV heparin at present. For A. fib patient started on amiodarone, receiving extra dose of amiodarone due to RVR. Appreciate cardiology input. CT PE negative for pulmonary embolism. Transition to by mouth anticoagulation once hemodynamically stable  3. Acute kidney injury. Chronic kidney disease stage II. Mild worsening due to diuresis, Continue to monitor at present avoid nephrotoxic medication. Pharmacy monitoring for renal dosing of antibiotics.  4. anemia  Hemoglobin dropped this morning. Repeat hemoglobin is also on a lower side. Reticulocyte count is actually not elevated. Patient is receiving blood transfusion at present. We will recheck CBC and monitor. No external bleeding seen at present. If the patient has further drop in hemoglobin CT abdomen pelvis is recommended to rule out  any intra-abdominal bleeding. No bleeding seen on CT  hip right side where the patient has complaint of pain. Continue to monitor in the step down unit.  5. BPH. Patient has indwelling Foley catheter. Unable to tolerate Foley catheter removal at present due to urinary retention and hemodynamic instability.  6. Hypothyroidism. Continue Synthroid. TSH last checked in February 2018.  7. GERD. Continue PPI at present.  8. Hypokalemia. Currently stable, continue monitoring daily.  9. Right groin pain. Patient complains about severe pain in his right groin. X-ray hip, CAD hip unremarkable for any acute abnormality. Mild soft tissue edema seen at the side which is likely is consistent with his anasarca. At present I do not have any etiology of patient's severe pain. Patient was given Dilaudid for pain control and his pressure drops significantly. Currently avoid IV narcotics.  10. Hypotension. In response of IV Dilaudid. Recommend to discontinue IV narcotics at present. Discussed with CCM. Small bolus of normal saline 250 mL ordered. Patient is receiving blood transfusion at present that should also help with his volume. Discussed with CCM who will closely monitor the patient.  Diet: cardiac diet DVT Prophylaxis: on therapeutic anticoagulation.  Advance goals of care discussion: full code  Family Communication: no family was present at bedside, at the time of interview.   Disposition:  Discharge to be decide.  Consultants: cardiology, CCM Procedures: thoracentesis  Antibiotics: Anti-infectives    Start     Dose/Rate Route Frequency Ordered Stop   May 16, 2017 0800  levofloxacin (LEVAQUIN) tablet 750 mg     750 mg Oral Every 48 hours 04/27/17 0809     04/26/17 1115  levofloxacin (LEVAQUIN) IVPB 750 mg  Status:  Discontinued     750 mg 100 mL/hr over 90 Minutes Intravenous Every 48 hours 04/26/17 1114 04/27/17 0808   04/25/17 1000  cefTRIAXone (ROCEPHIN) 1 g in dextrose 5 % 50 mL IVPB  Status:  Discontinued     1 g 100 mL/hr  over 30 Minutes Intravenous Every 24 hours 04/24/17 1502 04/26/17 1047   04/23/17 1030  cefTRIAXone (ROCEPHIN) 1 g in dextrose 5 % 50 mL IVPB  Status:  Discontinued     1 g 100 mL/hr over 30 Minutes Intravenous Every 24 hours 04/23/17 0932 04/24/17 1205       Objective: Physical Exam: Vitals:   04/27/17 1545 04/27/17 1551 04/27/17 1600 04/27/17 1623  BP: (!) 52/37 (!) 58/51 (!) 69/43   Pulse: 96 96 93   Resp:      Temp:  98 F (36.7 C)  98 F (36.7 C)  TempSrc:  Oral  Oral  SpO2: 99% 100% 100%   Weight:      Height:        Intake/Output Summary (Last 24 hours) at 04/27/17 1639 Last data filed at 04/27/17 1600  Gross per 24 hour  Intake              872 ml  Output              520 ml  Net              352 ml   Filed Weights   04/25/17 0500 04/26/17 0600 04/27/17 0359  Weight: 92.7 kg (204 lb 5.9 oz) 93.6 kg (206 lb 5.6 oz) 93.4 kg (206 lb)   General: Alert, Awake and Oriented to Time, Place and Person. Appear in marked distress, affect appropriate Eyes: PERRL, Conjunctiva normal ENT: Oral Mucosa clear moist. Neck: positive JVD, no  Abnormal Mass Or lumps Cardiovascular: S1 and S2 Present, aortic systolic Murmur, Peripheral Pulses Present Respiratory: increased respiratory effort, Bilateral Air entry equal and Decreased, positive use of accessory muscle, bilateral Crackles, no wheezes Abdomen: Bowel Sound present, Soft and no tenderness, no hernia Skin: no redness, no Rash, no induration Extremities: bilateral Pedal edema, no calf tenderness Neurologic: Grossly no focal neuro deficit. Bilaterally Equal motor strength  Data Reviewed: CBC:  Recent Labs Lab 04/24/17 0301 04/25/17 0434 04/26/17 0212 04/27/17 0824 04/27/17 1419  WBC 7.2 4.2 8.2 12.8* 11.6*  NEUTROABS  --   --   --   --  10.5*  HGB 8.8* 8.6* 8.7* 7.9* 7.3*  HCT 30.2* 28.6* 28.6* 26.2* 23.8*  MCV 93.2 90.8 90.5 91.0 90.8  PLT 160 138* 161 170 381*   Basic Metabolic Panel:  Recent Labs Lab  04/21/17 0242  04/23/17 0300 04/24/17 0301 04/25/17 0434 04/26/17 0212 04/27/17 0824  NA 140  < > 139 140 137 137 137  K 2.8*  < > 3.8 4.5 3.9 3.9 4.0  CL 95*  < > 98* 98* 98* 97* 95*  CO2 37*  < > 31 34* 31 31 32  GLUCOSE 119*  < > 127* 111* 174* 169* 130*  BUN 18  < > 22* 29* 30* 35* 46*  CREATININE 1.24  < > 1.38* 1.55* 1.51* 1.38* 1.59*  CALCIUM 8.6*  < > 8.7* 8.7* 8.8* 9.0 9.0  MG 2.1  --   --  2.0 2.2 2.2 2.0  PHOS  --   --   --  3.0 3.4 3.5 4.0  < > = values in this interval not displayed.  Liver Function Tests:  Recent Labs Lab 04/21/17 0242 04/27/17 0824  AST 32  --   ALT 40  --   ALKPHOS 83  --   BILITOT 0.8  --   PROT 5.5*  --   ALBUMIN 2.8* 2.9*   No results for input(s): LIPASE, AMYLASE in the last 168 hours. No results for input(s): AMMONIA in the last 168 hours. Coagulation Profile: No results for input(s): INR, PROTIME in the last 168 hours. Cardiac Enzymes: No results for input(s): CKTOTAL, CKMB, CKMBINDEX, TROPONINI in the last 168 hours. BNP (last 3 results) No results for input(s): PROBNP in the last 8760 hours. CBG:  Recent Labs Lab 04/26/17 1659 04/26/17 2124 04/27/17 0836 04/27/17 1316 04/27/17 1622  GLUCAP 128* 144* 125* 146* 142*   Studies: Ct Hip Right Wo Contrast  Result Date: 04/27/2017 CLINICAL DATA:  Twisting injury in wheelchair. Sudden onset of anterior right groin pain. History of right hip arthroplasty. EXAM: CT OF THE RIGHT HIP WITHOUT CONTRAST TECHNIQUE: Multidetector CT imaging of the right hip was performed according to the standard protocol. Multiplanar CT image reconstructions were also generated. COMPARISON:  Radiograph same date.  Pelvic CT 07/31/2008. FINDINGS: Bones/Joint/Cartilage Right total hip arthroplasty creates beam hardening artifact, mildly limiting evaluation of the pubic rami. There is no evidence of hardware loosening, acute fracture or dislocation. No large hip joint effusion identified. Ligaments Not  relevant for exam/indication. Muscles and Tendons Unremarkable. Soft tissues Generalized subcutaneous edema throughout the visualized pelvis and proximal right thigh. No focal fluid collections are identified. Edema tracks into the pelvis along the anterior aspect of the iliac vessels and right psoas muscle. There is iliofemoral atherosclerosis. The bladder is distended. Lower lumbar spondylosis noted. IMPRESSION: 1. No acute osseous findings status post right total hip arthroplasty. 2. Nonspecific soft tissue edema throughout the right  pelvis and proximal thigh. No focal fluid collections identified. Electronically Signed   By: Richardean Sale M.D.   On: 04/27/2017 12:37   Dg Chest Port 1 View  Result Date: 04/27/2017 CLINICAL DATA:  Follow-up effusion EXAM: PORTABLE CHEST 1 VIEW COMPARISON:  04/26/2017 FINDINGS: Cardiac shadow is stable. Postsurgical changes are again seen. Pacing device is again noted. Right-sided pleural effusion is again noted. Persistent left basilar infiltrate is seen. No new focal abnormality is noted. IMPRESSION: No significant interval changes noted. Electronically Signed   By: Inez Catalina M.D.   On: 04/27/2017 08:16   Dg Hip Port Unilat With Pelvis 1v Right  Result Date: 04/27/2017 CLINICAL DATA:  Right groin pain EXAM: DG HIP (WITH OR WITHOUT PELVIS) 1V PORT RIGHT COMPARISON:  None. FINDINGS: Right total hip arthroplasty. No fracture or dislocation. No hardware failure or complication. IMPRESSION: Right total hip arthroplasty without hardware failure or complication. Electronically Signed   By: Kathreen Devoid   On: 04/27/2017 08:31    Scheduled Meds: . alfuzosin  10 mg Oral Q breakfast  . amiodarone  200 mg Oral Daily  . aspirin EC  81 mg Oral Daily  . cyclobenzaprine  5 mg Oral TID  . docusate  100 mg Oral Daily  . feeding supplement (PRO-STAT SUGAR FREE 64)  30 mL Oral BID  . finasteride  5 mg Oral Daily  . furosemide  40 mg Intravenous Q12H  . guaiFENesin  600 mg  Oral BID  . insulin aspart  0-15 Units Subcutaneous TID WC  . ipratropium-albuterol  3 mL Nebulization Q6H  . lactose free nutrition  237 mL Oral BID BM  . [START ON May 20, 2017] levofloxacin  750 mg Oral Q48H  . levothyroxine  112 mcg Oral QAC breakfast  . pantoprazole  40 mg Oral Daily  . polyethylene glycol  17 g Oral Daily  . pravastatin  40 mg Oral Daily  . predniSONE  40 mg Oral Q breakfast   Continuous Infusions: . heparin 1,100 Units/hr (04/27/17 1600)   PRN Meds: albuterol, ondansetron (ZOFRAN) IV, oxyCODONE-acetaminophen  Time spent: 45 minutes  Author: Berle Mull, MD [facility] Pager: [phone] 04/27/2017 4:39 PM  If 7PM-7AM, please contact night-coverage at [email or web address], password [email or web address]

## 2017-04-27 NOTE — Progress Notes (Signed)
PCCM Interval Progress Note  Called to bedside to evaluate pt for hypotension.  Has received 1u PRBC, 250cc NS bolus, 12.5g Albumin.\ Echo from 5/23 reviewed, EF 55-60%. I/O's are -2.3 L since admit.  Pt states that he does NOT want to be transferred to ICU and have heroic measures done.  In fact, he has told RN that he would want to transfer to hospice instead; however, his family would not "accept" this.  Family are supposed to be in tomorrow AM 5/29 and pt plans to discuss this further with them at that time.  I have asked RN to administer an additional 500cc bolus now and continue to monitor.  We will attempt to keep pt in current unit / bed until AM when further family discussions can be had with primary team to establish / clarify goals of care.  For now, hope to avoid any possible transfer to ICU and would really not want to place central line and start vasopressors given pt's wishes.  Code status remains full code for now until pt can explain his wishes to family in AM.   Montey Hora, [place] Pager: [phone]  or [phone] 04/27/2017, 11:03 PM

## 2017-04-28 LAB — CULTURE, BLOOD (ROUTINE X 2)
CULTURE: NO GROWTH
Culture: NO GROWTH
Special Requests: ADEQUATE
Special Requests: ADEQUATE

## 2017-04-28 LAB — TYPE AND SCREEN
ABO/RH(D): A POS
ANTIBODY SCREEN: NEGATIVE
Unit division: 0
Unit division: 0

## 2017-04-28 LAB — BPAM RBC
Blood Product Expiration Date: 201806122359
Blood Product Expiration Date: 201806122359
ISSUE DATE / TIME: 201805281524
ISSUE DATE / TIME: 201805281810
Unit Type and Rh: 6200
Unit Type and Rh: 6200

## 2017-04-28 MED ORDER — MORPHINE SULFATE (PF) 4 MG/ML IV SOLN
1.0000 mg | INTRAVENOUS | Status: DC | PRN
  Administered 2017-04-28: 1 mg via INTRAVENOUS
  Filled 2017-04-28 (×2): qty 1

## 2017-04-29 LAB — CULTURE, BODY FLUID-BOTTLE: CULTURE: NO GROWTH

## 2017-04-29 LAB — CULTURE, BODY FLUID W GRAM STAIN -BOTTLE

## 2017-04-30 LAB — LIPASE, FLUID: LIPASE-FLUID: 17 U/L

## 2017-05-01 NOTE — Plan of Care (Signed)
Patient requested no further aggressive measures and wants to be DO NOT RESUSCITATE. Patient wants to be referred to hospice. Patient's son at the bedside. Will place patient on morphine when necessary for shortness of breath and discomfort. Discontinuing all other labs.  Steven Everett.

## 2017-05-01 NOTE — Progress Notes (Signed)
Patient called his son to come and talk to him regarding code status. Patient explained to his son he does not want to be resuscitated and does not want further interventions regarding care. Stated " I want to go to hospice" Dr. Cyndia Diver at bedside and discussed situation with patient and son. Patient will now be made DNR and will continue conversation regarding being transferred to hospice tomorrow with social worker.                         Paige Vanderwoude RN

## 2017-05-01 NOTE — Progress Notes (Signed)
Patient with no heart signs,breath sounds, or pulse noted times 2 nurses. Patients son at bedside , all questions answered and support given. Dr. Rosina Lowenstein notified and patient placement notified. Patient belongings given to family.                                                            Essie Lagunes RN

## 2017-05-01 DEATH — deceased

## 2017-05-11 ENCOUNTER — Ambulatory Visit: Payer: Medicare Other

## 2017-05-22 LAB — FUNGUS CULTURE WITH STAIN

## 2017-05-22 LAB — FUNGAL ORGANISM REFLEX

## 2017-05-22 LAB — FUNGUS CULTURE RESULT

## 2017-05-31 NOTE — Discharge Summary (Signed)
DEATH SUMMARY   Patient Details  Name: Steven Everett MRN: [id number] DOB: 01-01-1935  Admission/Discharge Information   Admit Date:  2017/05/02  Date of Death: Date of Death: 05-10-17  Time of Death: Time of Death: 0415  Length of Stay: 7  Referring Physician: Hali Marry, MD   Reason(s) for Hospitalization  acute hypoxic respiratory failure  Diagnoses  Preliminary cause of death:  Secondary Diagnoses (including complications and co-morbidities):  Principal Problem:   Acute on chronic combined systolic and diastolic CHF (congestive heart failure) ([place]) Active Problems:   Hypothyroidism   CAD (coronary artery disease)   Hyperlipidemia   Chronic systolic heart failure (H[facility])   PAF (paroxysmal atrial fibrillation) ([place])   ICD (implantable cardioverter-defibrillator) in place   DVT (deep venous thrombosis) ([place])   Anemia due to chronic kidney disease   Hypokalemia   Hypotension   Acute respiratory failure ([place])   HCAP (healthcare-associated pneumonia)   Protein-calorie malnutrition, severe   Brief Hospital Course (including significant findings, care, treatment, and services provided and events leading to death)  Pt. with PMH of HTN, HLD, CAD s/p recent CABG s/p ICD, and systolic HF w/LVEF 48-54% (per cath 5/1), mod AS, PAF s/p watchman, cirrhosis, DM type 2, lung ca Feb 20, 1997 tx with right middle lobe wedge resection and right lower lobectomy, chemo and xrt), GERD and hypothyroidism ; admitted on 05-02-17, presented with complaint of swelling of the left arm, was found to have acute DVT, acute on chronic combined CHF. 1. Acute on chronic combined systolic and diastolic CHF (congestive heart failure) (HCC) Acute hypoxic respiratory failure. Bilateral pleural effusion. Acute healthcare associated pneumonia. Acute exacerbation of COPD. The patient presented with complaints of left wrist swelling but then was found to have acute hypoxia secondary to CHF. Echo shows  improved EF of 60%. improved over prior. Patient was started on IV Lasix, cardiology was consulted. Patient was getting increasing doses of Lasix despite which pt was not improving and then started developing hypotension.  Patient was transferred to step down unit. Underwent thoracentesis.  Critical care was consulted and patient was transferred to ICU, treated with BiPAP and IV Lasix and IV steroids and DuoNeb. Was stable and therefore transferred back to the floor on stepdown. Oxygenation about the same.   Treated with IV Lasix, recheck x-ray shows mild worsening of pleural effusion although the report mentions no interval change. cardiology and CCM continues to follow.  On oral prednisone. Patient's CT scan was showing evidence of pneumonia, given his recent hospitalization and surgery I treated him with Levaquin.  MRSA PCR negative, blood cultures negative therefore no indication for further broadening of antibiotics.  Underwent thoracentesis 04/24/2017. Fluid analysis suggest exudative effusion based on lites criteria. Serosanguineous fluid, cytology currently pending. History of lung cancer in this patient. CT scan earlier did not mention any reoccurrence of cancer.  2. A. fib with RVR. ICD implant. Acute upper extremity DVT. Started on heparin on admission, continued on heparin Patient complained about one episode of blood streaked sputum, no active hemoptysis. CXR stable. ForA. fib patient started on amiodarone, receiving extra dose of amiodarone due to RVR. Appreciate cardiology input. CT PE negative for pulmonary embolism.  3.Acute kidney injury. Chronic kidney disease stage II. Mild worsening due to diuresis,  4.anemia  hemoglobin on a lower side.  Reticulocyte count is actually not elevated.  S/P 1 PRBC. No external bleeding seen at present. CT abdomen pelvis is recommended to rule out any intra-abdominal bleeding but pt was not  able to tolerate laying flat for  it.. No bleeding seen on CT hip right side where the patient has complaint of pain.  5. BPH. S/P indwelling Foley catheter.  Unable to tolerate Foley catheter removal due to urinary retention and hemodynamic instability.  6.Hypothyroidism. TSH last checked in February 2018.  7.GERD.  8. Hypokalemia.  9. Right groin pain. Patient complains about severe pain in his right groin. X-ray hip, CAD hip unremarkable for any acute abnormality. Mild soft tissue edema seen at the side which is likely is consistent with his anasarca. Patient was given Dilaudid for pain control and his pressure drops significantly.  10. Hypotension. In response of IV Dilaudid. Recommend to discontinue IV narcotics at present. Discussed with CCM. Small bolus of normal saline 250 mL ordered. Patient also received blood transfusion to help with his volume.   On 05/07/2017 pt requested to discuss with MD, overnight. MD on call evaluated the pt, discuss with critical care and patient. Pt expressed wishes to be kept comfortable without any heroic measure and requested to remain DNR DNI, his family was at bedside.  Pt was pronounced deceased at 4:15 Am.  Pertinent Labs and Studies  Significant Diagnostic Studies Dg Chest 1 View  Result Date: 04/24/2017 CLINICAL DATA:  Post thoracentesis EXAM: CHEST 1 VIEW COMPARISON:  04/22/2017 FINDINGS: Moderate right pleural effusion and small left pleural effusion, similar to prior study. No pneumothorax following left thoracentesis. Left pacer remains in place, unchanged. Heart is borderline in size. Prior CABG. Bibasilar atelectasis. IMPRESSION: Moderate right pleural effusion and small left pleural effusion. No pneumothorax following thoracentesis. Bibasilar atelectasis. Electronically Signed   By: Rolm Baptise M.D.   On: 04/24/2017 10:10   Dg Chest 2 View  Result Date: 04/25/2017 CLINICAL DATA:  Short of breath.  Lung cancer EXAM: CHEST  2 VIEW COMPARISON:  04/06/2017  FINDINGS: CABG changes. Internal cardiac defibrillator unchanged. Moderate bilateral effusions with bibasilar atelectasis unchanged. Negative for edema. Small right apical pneumothorax unchanged. PICC line removed since the prior study. Apical scarring bilaterally. IMPRESSION: Bilateral pleural effusions and bibasilar atelectasis. No change from the prior study. Negative for edema Probable small right apical pneumothorax unchanged. Electronically Signed   By: Franchot Gallo M.D.   On: 04/19/2017 17:00   Ct Angio Chest Pe W Or Wo Contrast  Result Date: 04/11/2017 CLINICAL DATA:  Complains of dyspnea for 2 days. History of sinus node dysfunction. EXAM: CT ANGIOGRAPHY CHEST WITH CONTRAST TECHNIQUE: Multidetector CT imaging of the chest was performed using the standard protocol during bolus administration of intravenous contrast. Multiplanar CT image reconstructions and MIPs were obtained to evaluate the vascular anatomy. CONTRAST:  80 cc of Isovue 370 COMPARISON:  03/31/2017 FINDINGS: Cardiovascular: There is a left chest wall pacer device with leads in the right atrial appendage and right ventricle. Moderate cardiac enlargement. Previous median sternotomy and CABG procedure. No pericardial effusion identified. The main pulmonary artery appears patent. No saddle embolus. No lobar or segmental pulmonary artery filling defects identified. Mediastinum/Nodes: Fluid identified within the retrosternal space compatible with recent sternotomy. No specific findings identified to suggest abscess. Pre-vascular lymph node Measures 1.5 cm. Increased from 9 mm previously. The trachea appears patent. Unremarkable appearance of the esophagus. No axillary or supraclavicular adenopathy. Lungs/Pleura: There is a moderate to large left pleural effusion present. Overlying compressive type atelectasis and consolidation involving the left lower lobe and lingula noted. Paramediastinal architectural distortion, fibrosis and scarring noted  compatible changes of external beam radiation. There is mild diffuse pleural  thickening overlying the right lung. Superimposed airspace consolidation within the right lung base is identified, image 74 of series 8. Unchanged right lung nodule measuring 4 mm, image 44 of series 8. Upper Abdomen: Gallstones are identified measuring up to 1.9 cm. No acute abnormality identified within the upper abdomen. Musculoskeletal: There is multi level degenerative disc disease identified within the thoracic spine. Recent sternotomy defect is again noted. Sternotomy wires remain intact. Review of the MIP images confirms the above findings. IMPRESSION: 1. No evidence for acute pulmonary embolus. 2. Changes compatible with recent median sternotomy with CABG procedure. Findings include retrosternal fluid and unhealed sternotomy defect. 3. There is a moderate to large left pleural effusion. 4. Changes of external beam radiation involve the right lung. Superimposed airspace opacity within the right lung base is noted which is nonspecific. Aspiration or pneumonia cannot be excluded. 5. Gallstones. Electronically Signed   By: Kerby Moors M.D.   On: 04/06/2017 20:47   Ct Hip Right Wo Contrast  Result Date: 04/27/2017 CLINICAL DATA:  Twisting injury in wheelchair. Sudden onset of anterior right groin pain. History of right hip arthroplasty. EXAM: CT OF THE RIGHT HIP WITHOUT CONTRAST TECHNIQUE: Multidetector CT imaging of the right hip was performed according to the standard protocol. Multiplanar CT image reconstructions were also generated. COMPARISON:  Radiograph same date.  Pelvic CT 07/31/2008. FINDINGS: Bones/Joint/Cartilage Right total hip arthroplasty creates beam hardening artifact, mildly limiting evaluation of the pubic rami. There is no evidence of hardware loosening, acute fracture or dislocation. No large hip joint effusion identified. Ligaments Not relevant for exam/indication. Muscles and Tendons Unremarkable. Soft  tissues Generalized subcutaneous edema throughout the visualized pelvis and proximal right thigh. No focal fluid collections are identified. Edema tracks into the pelvis along the anterior aspect of the iliac vessels and right psoas muscle. There is iliofemoral atherosclerosis. The bladder is distended. Lower lumbar spondylosis noted. IMPRESSION: 1. No acute osseous findings status post right total hip arthroplasty. 2. Nonspecific soft tissue edema throughout the right pelvis and proximal thigh. No focal fluid collections identified. Electronically Signed   By: Richardean Sale M.D.   On: 04/27/2017 12:37   Dg Chest Port 1 View  Result Date: 04/27/2017 CLINICAL DATA:  Follow-up effusion EXAM: PORTABLE CHEST 1 VIEW COMPARISON:  04/26/2017 FINDINGS: Cardiac shadow is stable. Postsurgical changes are again seen. Pacing device is again noted. Right-sided pleural effusion is again noted. Persistent left basilar infiltrate is seen. No new focal abnormality is noted. IMPRESSION: No significant interval changes noted. Electronically Signed   By: Inez Catalina M.D.   On: 04/27/2017 08:16   Dg Chest Port 1 View  Result Date: 04/26/2017 CLINICAL DATA:  Pleural effusions. EXAM: PORTABLE CHEST 1 VIEW COMPARISON:  Radiograph of Apr 25, 2017. FINDINGS: Stable cardiomediastinal silhouette. Status post coronary bypass graft. Left-sided pacemaker is unchanged in position. Stable left basilar edema or infiltrate is noted with associated pleural effusion. Stable moderate right pleural effusion is noted with probable atelectasis or infiltrate. Bony thorax is unremarkable. IMPRESSION: Stable bilateral lung opacities and effusions as described above. Electronically Signed   By: Marijo Conception, M.D.   On: 04/26/2017 07:29   Dg Chest Port 1 View  Result Date: 04/25/2017 CLINICAL DATA:  Acute respiratory failure EXAM: PORTABLE CHEST 1 VIEW COMPARISON:  Apr 24, 2017 FINDINGS: The cardiomediastinal silhouette is stable. An elevated  right hemidiaphragm is identified. Right-sided pleural effusion and underlying opacity is stable. Probable layering effusion and underlying opacity in the left base has worsened.  No other interval changes. IMPRESSION: 1. Worsening effusion and opacity in the left base. 2. Stable effusion and opacity on the right. Electronically Signed   By: Dorise Bullion III M.D   On: 04/25/2017 07:27   Dg Chest Port 1 View  Result Date: 04/24/2017 CLINICAL DATA:  Pulmonary edema.  CABG 04/01/2017 EXAM: PORTABLE CHEST 1 VIEW COMPARISON:  04/24/2017 FINDINGS: Moderate right pleural effusion. Small left pleural effusion. Mild bilateral interstitial thickening. No pneumothorax. Stable cardiomediastinal silhouette. Changes from recent CABG. Dual lead cardiac pacemaker. IMPRESSION: Findings concerning for persistent pulmonary edema. Electronically Signed   By: Kathreen Devoid   On: 04/24/2017 12:22   Dg Chest Port 1 View  Result Date: 04/22/2017 CLINICAL DATA:  Shortness of breath EXAM: PORTABLE CHEST 1 VIEW COMPARISON:  04/27/2017 FINDINGS: Bilateral pleural effusion, likely moderate on the left. Post treatment changes to the right chest with volume loss and diaphragm elevation. Stable heart size and mediastinal contours. Status post CABG. Dual-chamber ICD/ pacer from the left. No visible pneumothorax today. IMPRESSION: 1. Stable compared to 2 days prior. 2. Bilateral pleural effusion, moderate on the left. Electronically Signed   By: Monte Fantasia M.D.   On: 04/22/2017 14:29   Dg Hip Port Unilat With Pelvis 1v Right  Result Date: 04/27/2017 CLINICAL DATA:  Right groin pain EXAM: DG HIP (WITH OR WITHOUT PELVIS) 1V PORT RIGHT COMPARISON:  None. FINDINGS: Right total hip arthroplasty. No fracture or dislocation. No hardware failure or complication. IMPRESSION: Right total hip arthroplasty without hardware failure or complication. Electronically Signed   By: Kathreen Devoid   On: 04/27/2017 08:31   Ir Thoracentesis Asp Pleural  Space W/img Guide  Result Date: 04/24/2017 INDICATION: Acute on chronic systolic congestive heart failure with acute respiratory failure and a left pleural effusion noted on recent imaging. Request is made for a diagnostic and therapeutic thoracentesis with a maximum of 1.5 L. EXAM: ULTRASOUND GUIDED DIAGNOSTIC AND THERAPEUTIC THORACENTESIS MEDICATIONS: 1% lidocaine COMPLICATIONS: None immediate. PROCEDURE: An ultrasound guided thoracentesis was thoroughly discussed with the patient and questions answered. The benefits, risks, alternatives and complications were also discussed. The patient understands and wishes to proceed with the procedure. Written consent was obtained. Ultrasound was performed to localize and mark an adequate pocket of fluid in the left chest. The area was then prepped and draped in the normal sterile fashion. 1% Lidocaine was used for local anesthesia. Under ultrasound guidance a Safe-T-Centesis catheter was introduced. Thoracentesis was performed. The procedure was terminated early secondary to symptomatic hypotension. The catheter was removed and a dressing applied. FINDINGS: A total of approximately 1 L of serosanguineous fluid was removed. Samples were sent to the laboratory as requested by the clinical team. IMPRESSION: Successful ultrasound guided left thoracentesis yielding 1 L of pleural fluid. There is still a minimal amount of fluid noted on postprocedure imaging. The procedure was terminated early secondary to symptomatic hypotension. Read by: Saverio Danker, PA-C Electronically Signed   By: Corrie Mckusick D.O.   On: 04/24/2017 10:33   Procedures/Operations  Thoracentesis   Jacorian Golaszewski 05/05/2017 , 3:59 PM

## 2017-06-07 LAB — ACID FAST CULTURE WITH REFLEXED SENSITIVITIES (MYCOBACTERIA)

## 2017-06-07 LAB — ACID FAST CULTURE WITH REFLEXED SENSITIVITIES: ACID FAST CULTURE - AFSCU3: NEGATIVE

## 2018-06-02 ENCOUNTER — Encounter (HOSPITAL_COMMUNITY): Payer: Self-pay

## 2018-06-09 ENCOUNTER — Encounter (HOSPITAL_COMMUNITY): Payer: Self-pay | Admitting: Cardiology

## 2018-08-22 IMAGING — CR DG CHEST 1V PORT
1 series · 1 of 1 positions shown · non-contrast
Comparison: 04/01/2017

CLINICAL DATA: Status post coronary bypass grafting

EXAM:
PORTABLE CHEST 1 VIEW

[AP]
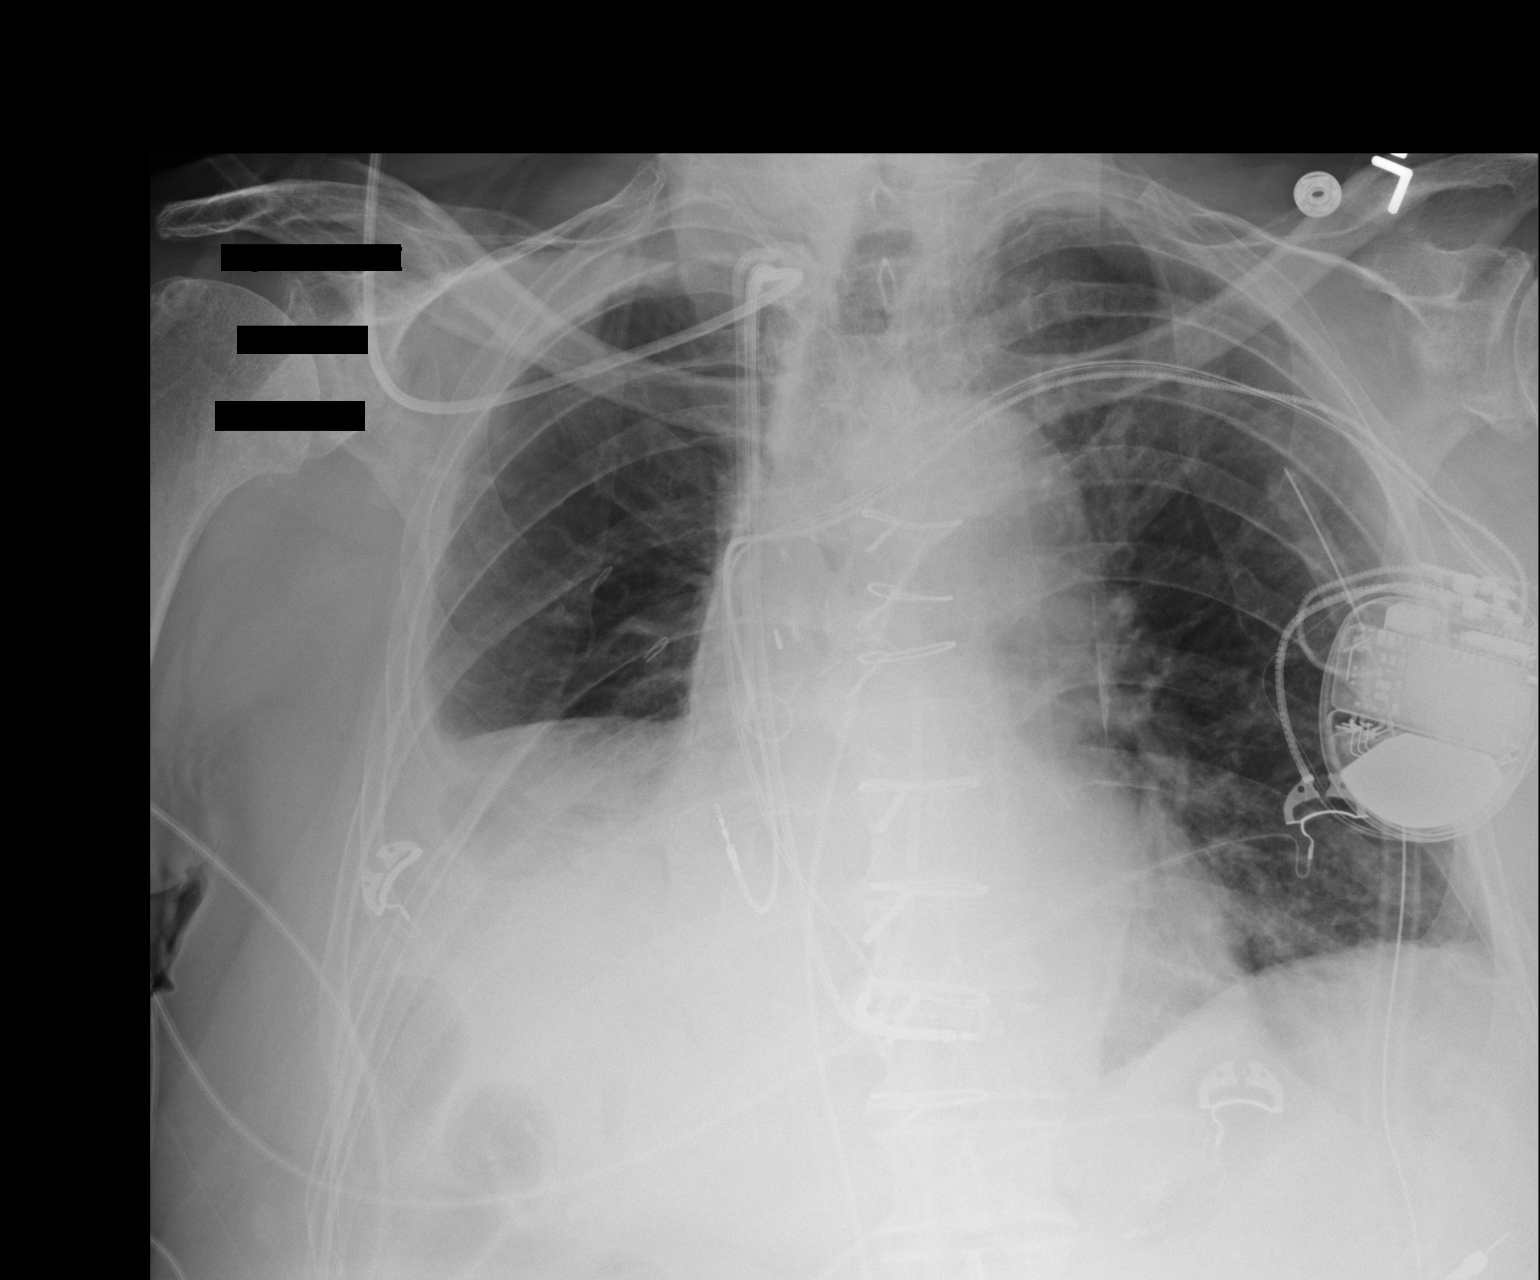

[1 of 1 positions shown; findings below may reference images not displayed]

FINDINGS: Cardiac shadow is stable. Postsurgical changes are again seen.
Defibrillator is again noted and stable. Swan-Ganz catheter is noted
in the pulmonary outflow tract. Left-sided chest tube and
mediastinal drain are seen. Nasogastric catheter is coiled within
the stomach. Elevation the right hemidiaphragm is noted with
blunting of the right costophrenic angle. No pneumothorax is seen.
IMPRESSION: Tubes and lines as described.

Chronic changes in the right lung base.  No acute abnormality noted.

## 2018-08-23 IMAGING — CR DG CHEST 1V PORT
1 series · 1 of 1 positions shown · non-contrast
Comparison: 04/02/2017 and earlier.

CLINICAL DATA: 82-year-old male status post CABG on 04/01/2017.
Prior right middle and lower lobectomy.

EXAM:
PORTABLE CHEST 1 VIEW

[AP]
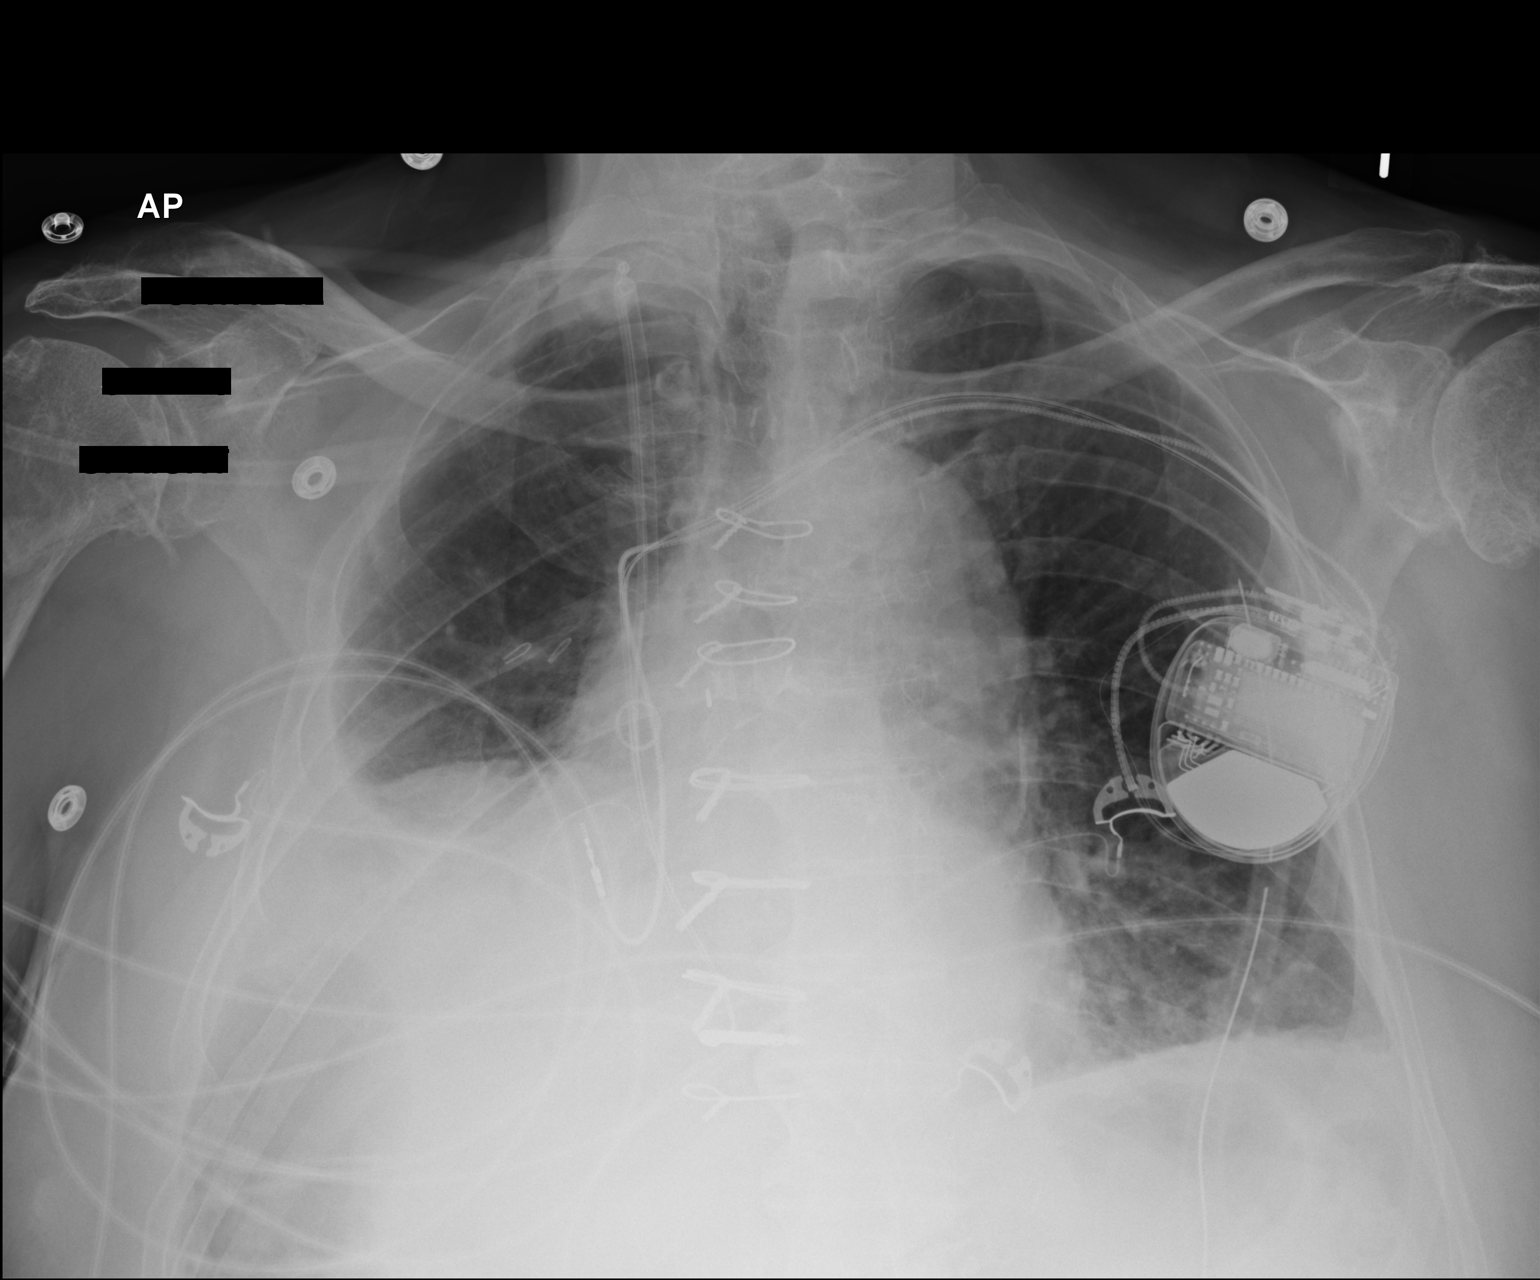

[1 of 1 positions shown; findings below may reference images not displayed]

FINDINGS: Portable AP upright view at 9857 hours. Left chest tube appears
stable. No pneumothorax. Stable left chest cardiac pacemaker. Right
IJ introducer sheath remains. Swan-Ganz catheter has been removed.
Stable cardiac size and mediastinal contours. Sequelae of CABG.
Stable lung volumes, chronically decreased on the right. No
pulmonary edema. No definite pleural effusion.
IMPRESSION: 1. Stable left chest tube and right IJ introducer sheath.
2. No pneumothorax or acute cardiopulmonary abnormality.

## 2018-08-25 IMAGING — DX DG CHEST 1V PORT
1 series · 1 of 1 positions shown · non-contrast
Comparison: 04/05/2017

CLINICAL DATA: Status post CABG postop day 4.

EXAM:
PORTABLE CHEST 1 VIEW

[chest ap]
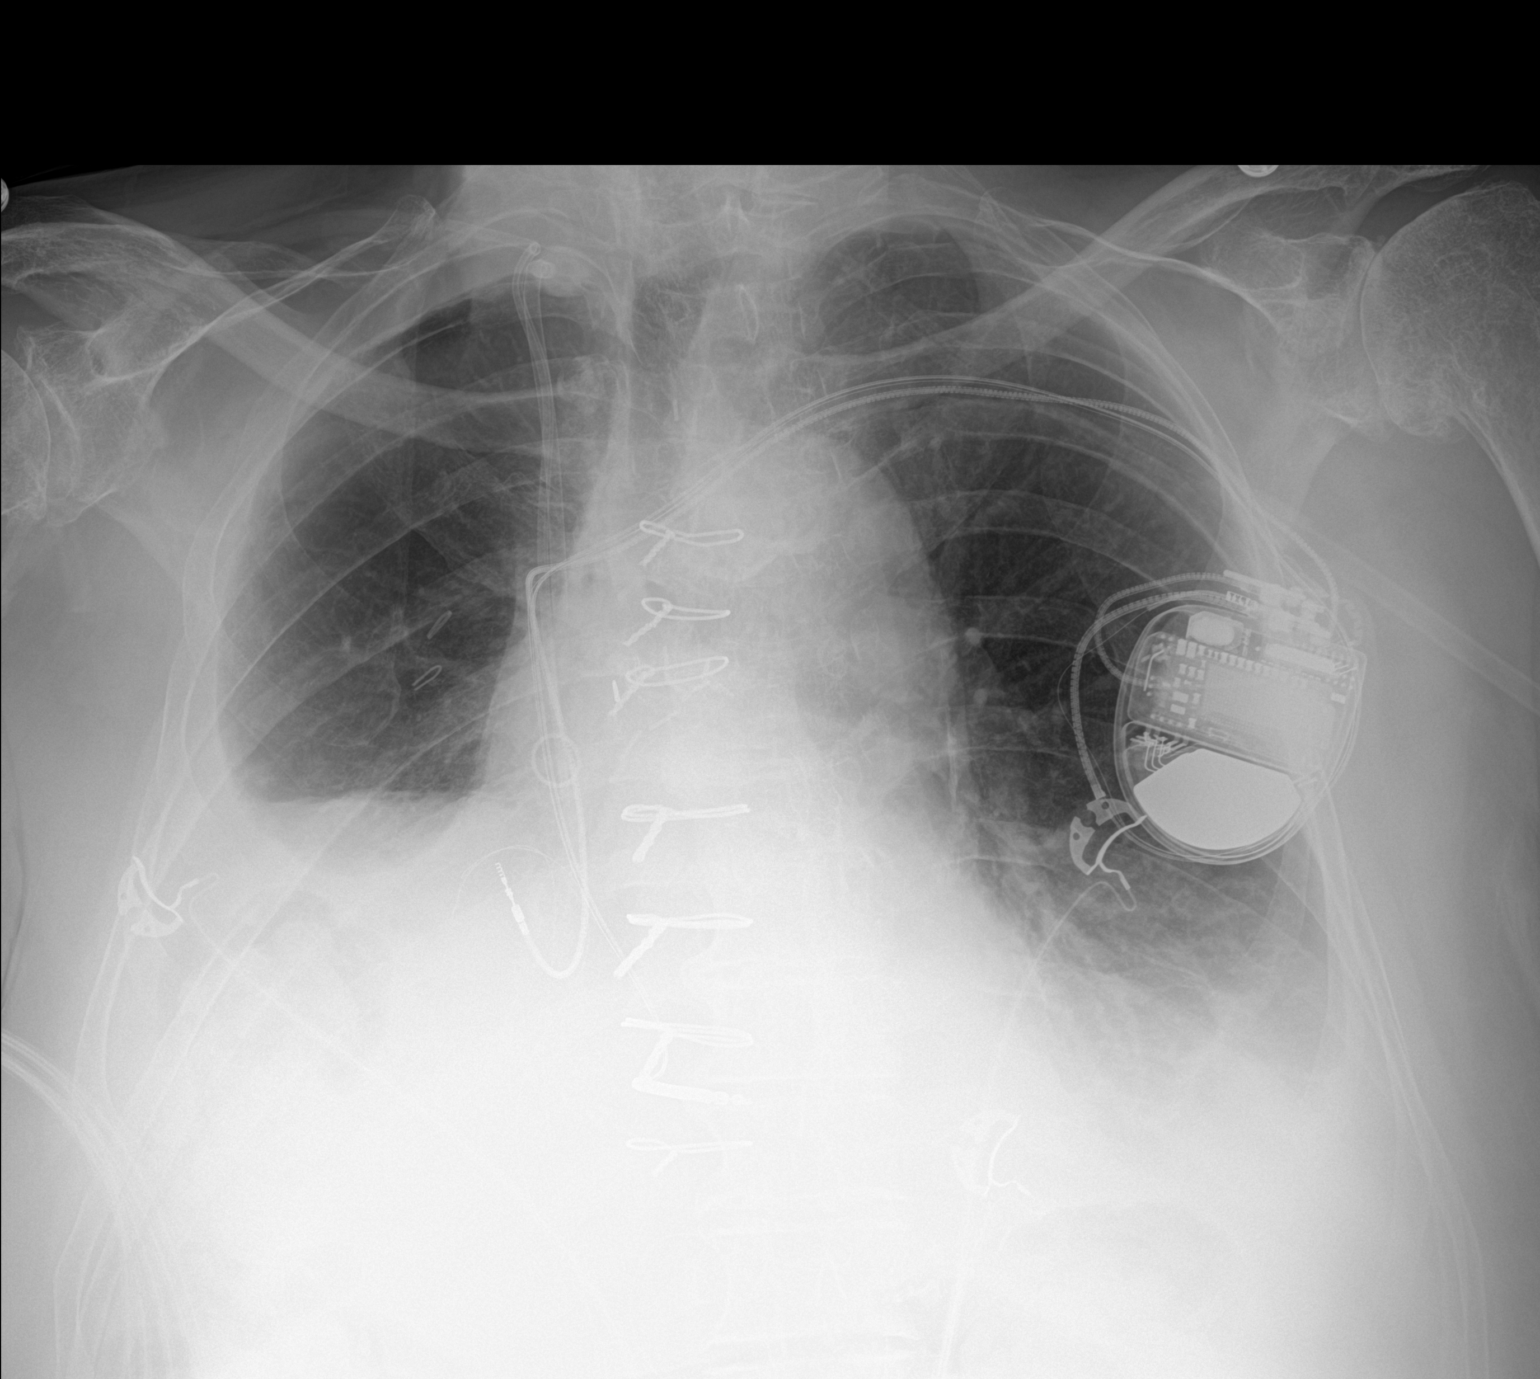

[1 of 1 positions shown; findings below may reference images not displayed]

FINDINGS: Cardiomegaly and CABG changes again identified.

Left-sided pacemaker is unchanged.

Two right IJ central venous catheters are present with tips
overlying the upper right atrium superior cavoatrial junction.

Bilateral pleural effusions, right greater than left and bibasilar
atelectasis noted.

There is no evidence of pneumothorax.
IMPRESSION: No significant change status post CABG, with support apparatus as
described. No evidence of pneumothorax.

## 2018-08-25 IMAGING — CR DG CHEST 1V PORT
1 series · 1 of 1 positions shown · non-contrast
Comparison: 04/04/2017 and earlier.

CLINICAL DATA: 82-year-old male CABG postop day 4. Previous right
middle and lower lobectomy.

EXAM:
PORTABLE CHEST 1 VIEW

[AP]
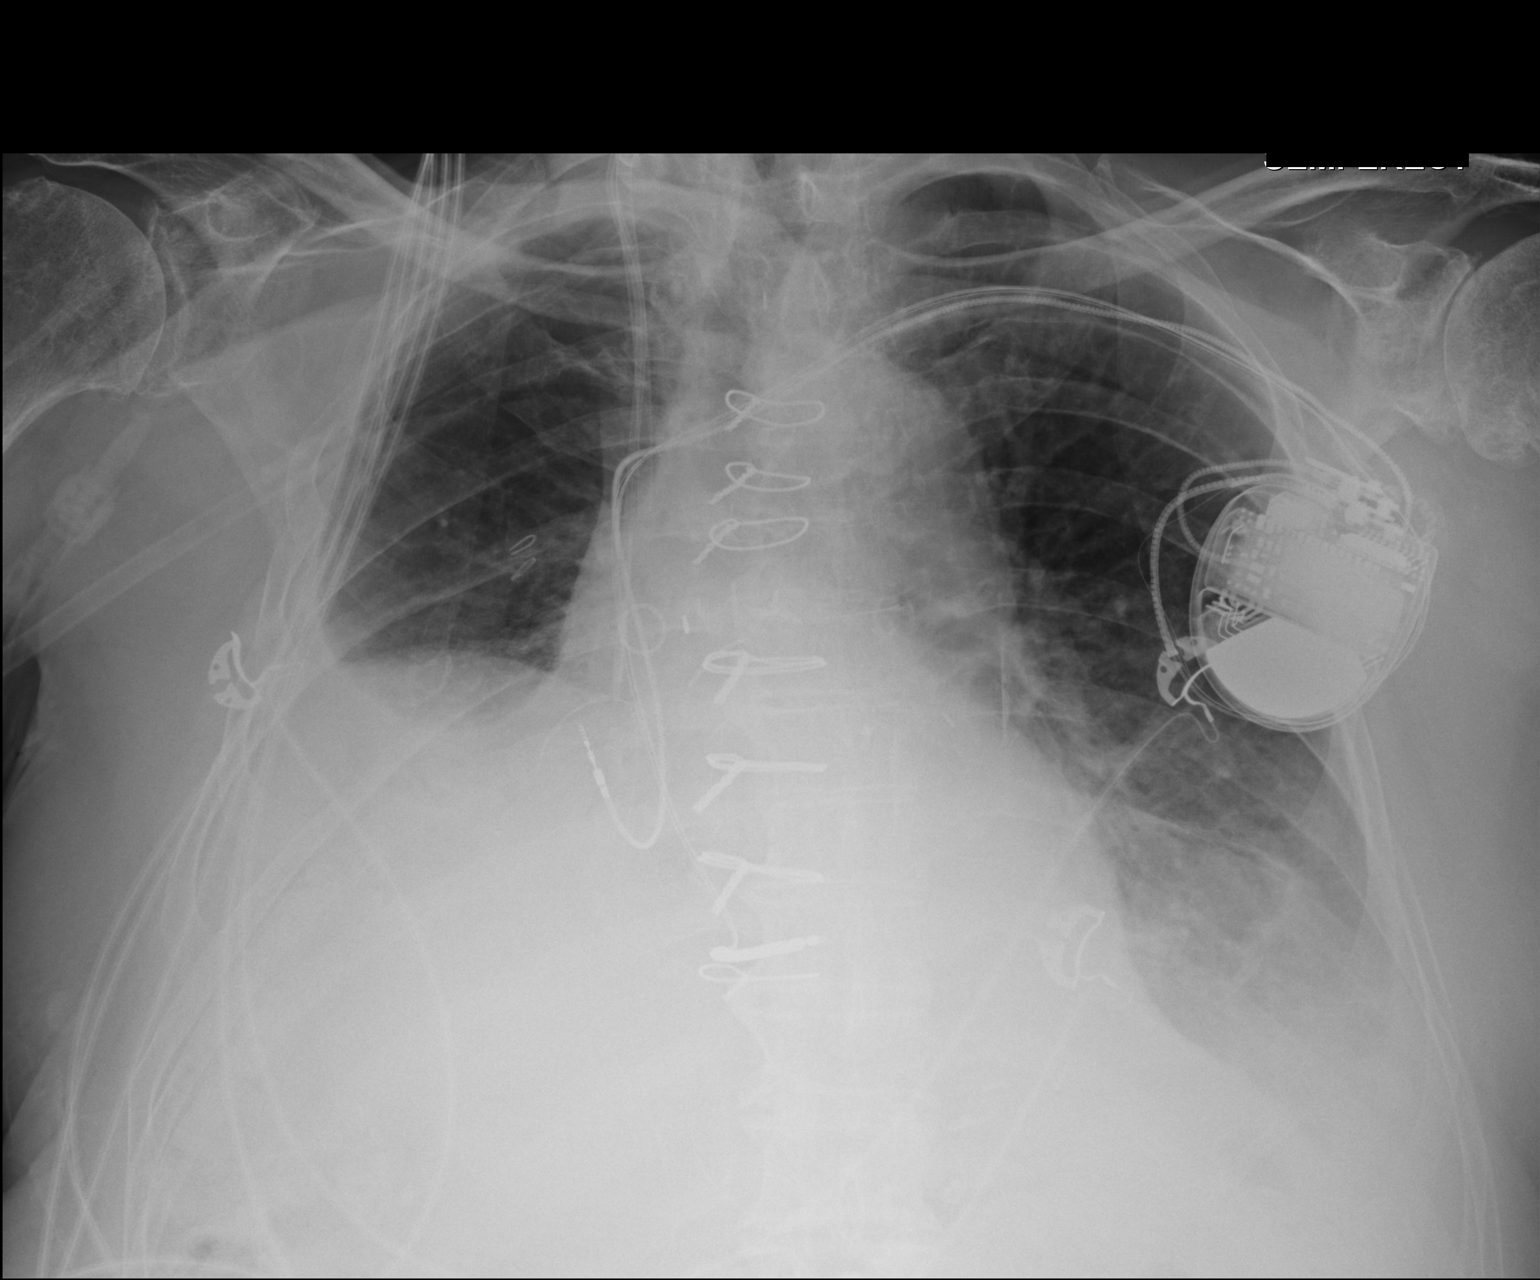

[1 of 1 positions shown; findings below may reference images not displayed]

FINDINGS: Portable AP semi upright view at at 0607 hours. Stable right IJ
approach introducer sheath. Stable postoperative reduced right lung
volume. Confluent left lung base opacity has progressed since
04/03/2017. No superimposed pulmonary edema or pneumothorax. Stable
cardiac size and mediastinal contours. Stable left chest cardiac
pacemaker.
IMPRESSION: 1. Increasing left lung base opacity since 04/03/2017 felt due to a
combination of small left pleural effusion and left lower lobe
collapse or consolidation.
2. No other acute cardiopulmonary abnormality.

## 2018-08-25 IMAGING — DX DG CHEST 1V PORT
1 series · 1 of 1 positions shown · non-contrast
Comparison: 04/05/2017

CLINICAL DATA: Line placement.

EXAM:
PORTABLE CHEST 1 VIEW

[chest ap]
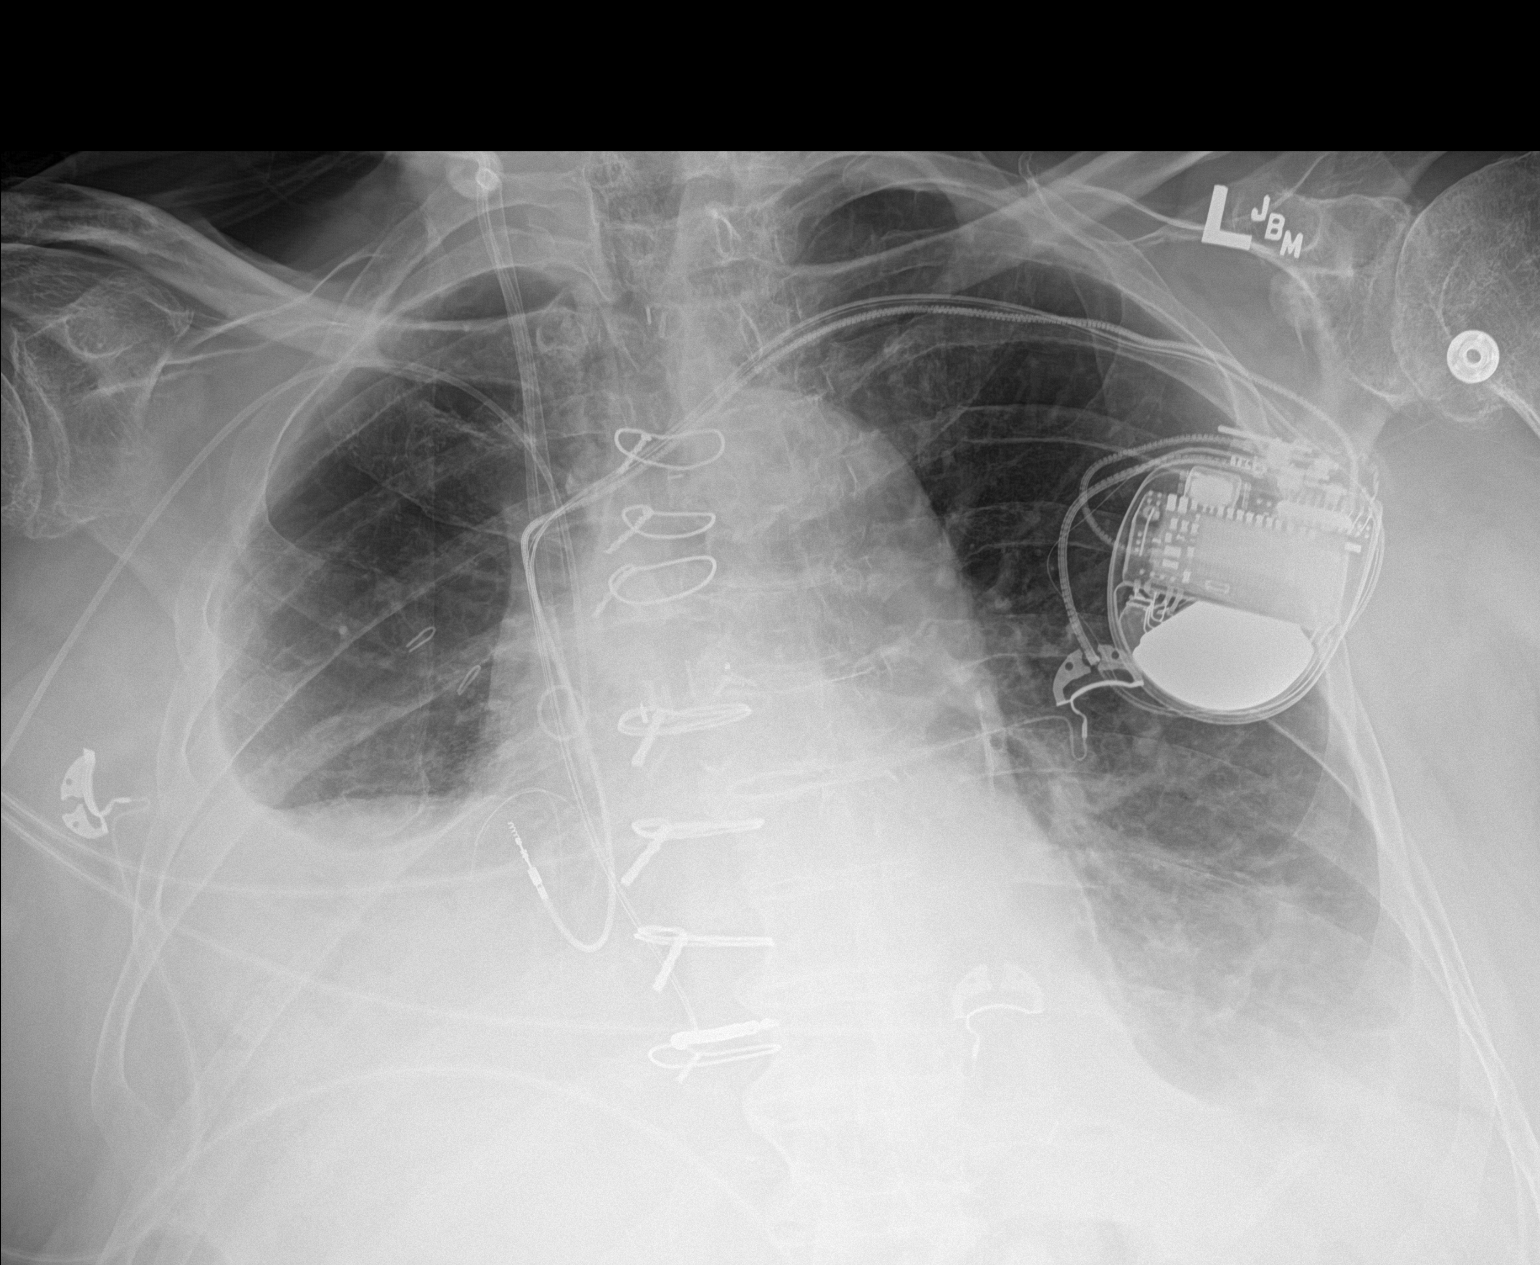

[1 of 1 positions shown; findings below may reference images not displayed]

FINDINGS: There is a right arm PICC line with tip in the projection of the
cavoatrial junction. Previous median sternotomy and CABG procedure.
Aortic atherosclerosis noted. Bilateral pleural effusions are
identified, right greater than left.
IMPRESSION: 1. Right arm PICC line tip is at the cavoatrial junction.
2. No change in CHF pattern.
